# Patient Record
Sex: Female | Born: 1935 | Race: Black or African American | Hispanic: No | Smoking: Never smoker
Health system: Southern US, Community
[De-identification: ages and names within clinical notes are randomized; demographics above are authoritative.]

## PROBLEM LIST (undated history)

## (undated) DIAGNOSIS — K219 Gastro-esophageal reflux disease without esophagitis: Secondary | ICD-10-CM

## (undated) DIAGNOSIS — K5731 Diverticulosis of large intestine without perforation or abscess with bleeding: Secondary | ICD-10-CM

## (undated) DIAGNOSIS — I429 Cardiomyopathy, unspecified: Secondary | ICD-10-CM

## (undated) DIAGNOSIS — Z9114 Patient's other noncompliance with medication regimen: Secondary | ICD-10-CM

## (undated) DIAGNOSIS — I10 Essential (primary) hypertension: Secondary | ICD-10-CM

## (undated) DIAGNOSIS — K648 Other hemorrhoids: Secondary | ICD-10-CM

## (undated) DIAGNOSIS — E7800 Pure hypercholesterolemia, unspecified: Secondary | ICD-10-CM

## (undated) DIAGNOSIS — I4891 Unspecified atrial fibrillation: Secondary | ICD-10-CM

## (undated) DIAGNOSIS — I5042 Chronic combined systolic (congestive) and diastolic (congestive) heart failure: Secondary | ICD-10-CM

## (undated) DIAGNOSIS — N184 Chronic kidney disease, stage 4 (severe): Secondary | ICD-10-CM

## (undated) DIAGNOSIS — Z7901 Long term (current) use of anticoagulants: Secondary | ICD-10-CM

## (undated) DIAGNOSIS — Z91148 Patient's other noncompliance with medication regimen for other reason: Secondary | ICD-10-CM

## (undated) DIAGNOSIS — R600 Localized edema: Secondary | ICD-10-CM

## (undated) DIAGNOSIS — E669 Obesity, unspecified: Secondary | ICD-10-CM

## (undated) DIAGNOSIS — E1169 Type 2 diabetes mellitus with other specified complication: Secondary | ICD-10-CM

## (undated) DIAGNOSIS — J302 Other seasonal allergic rhinitis: Secondary | ICD-10-CM

## (undated) DIAGNOSIS — D6959 Other secondary thrombocytopenia: Secondary | ICD-10-CM

## (undated) DIAGNOSIS — K5790 Diverticulosis of intestine, part unspecified, without perforation or abscess without bleeding: Secondary | ICD-10-CM

## (undated) DIAGNOSIS — R739 Hyperglycemia, unspecified: Secondary | ICD-10-CM

## (undated) DIAGNOSIS — T50905A Adverse effect of unspecified drugs, medicaments and biological substances, initial encounter: Secondary | ICD-10-CM

## (undated) DIAGNOSIS — K922 Gastrointestinal hemorrhage, unspecified: Secondary | ICD-10-CM

## (undated) HISTORY — PX: BUNIONECTOMY: SHX129

## (undated) HISTORY — PX: APPENDECTOMY: SHX54

## (undated) HISTORY — PX: ADENOIDECTOMY: SUR15

## (undated) HISTORY — PX: CATARACT EXTRACTION: SUR2

## (undated) HISTORY — PX: JOINT REPLACEMENT: SHX530

## (undated) HISTORY — PX: TUBAL LIGATION: SHX77

## (undated) HISTORY — PX: TONSILLECTOMY: SUR1361

---

## 2005-08-15 ENCOUNTER — Inpatient Hospital Stay (HOSPITAL_COMMUNITY): Admission: RE | Admit: 2005-08-15 | Discharge: 2005-08-21 | Payer: Self-pay | Admitting: Orthopedic Surgery

## 2005-09-04 ENCOUNTER — Encounter: Payer: Self-pay | Admitting: Orthopedic Surgery

## 2005-09-05 ENCOUNTER — Encounter: Payer: Self-pay | Admitting: Orthopedic Surgery

## 2005-10-06 ENCOUNTER — Encounter: Payer: Self-pay | Admitting: Orthopedic Surgery

## 2005-11-05 ENCOUNTER — Encounter: Payer: Self-pay | Admitting: Orthopedic Surgery

## 2007-10-29 ENCOUNTER — Inpatient Hospital Stay (HOSPITAL_COMMUNITY): Admission: RE | Admit: 2007-10-29 | Discharge: 2007-11-02 | Payer: Self-pay | Admitting: Orthopedic Surgery

## 2007-12-02 ENCOUNTER — Encounter: Payer: Self-pay | Admitting: Orthopedic Surgery

## 2007-12-07 ENCOUNTER — Encounter: Payer: Self-pay | Admitting: Orthopedic Surgery

## 2008-01-06 ENCOUNTER — Encounter: Payer: Self-pay | Admitting: Orthopedic Surgery

## 2008-06-15 ENCOUNTER — Encounter: Payer: Self-pay | Admitting: Family Medicine

## 2008-07-06 ENCOUNTER — Encounter: Payer: Self-pay | Admitting: Family Medicine

## 2008-08-05 ENCOUNTER — Encounter: Payer: Self-pay | Admitting: Family Medicine

## 2008-09-05 ENCOUNTER — Encounter: Payer: Self-pay | Admitting: Family Medicine

## 2008-11-12 ENCOUNTER — Ambulatory Visit (HOSPITAL_COMMUNITY): Admission: RE | Admit: 2008-11-12 | Discharge: 2008-11-12 | Payer: Self-pay | Admitting: Nephrology

## 2008-11-26 ENCOUNTER — Ambulatory Visit (HOSPITAL_COMMUNITY): Admission: RE | Admit: 2008-11-26 | Discharge: 2008-11-26 | Payer: Self-pay | Admitting: Nephrology

## 2009-02-05 DIAGNOSIS — K5731 Diverticulosis of large intestine without perforation or abscess with bleeding: Secondary | ICD-10-CM

## 2009-02-05 DIAGNOSIS — K5790 Diverticulosis of intestine, part unspecified, without perforation or abscess without bleeding: Secondary | ICD-10-CM

## 2009-02-05 DIAGNOSIS — K648 Other hemorrhoids: Secondary | ICD-10-CM

## 2009-02-05 HISTORY — DX: Diverticulosis of large intestine without perforation or abscess with bleeding: K57.31

## 2009-02-05 HISTORY — DX: Diverticulosis of intestine, part unspecified, without perforation or abscess without bleeding: K57.90

## 2009-02-05 HISTORY — DX: Other hemorrhoids: K64.8

## 2009-07-13 ENCOUNTER — Emergency Department (HOSPITAL_COMMUNITY): Admission: EM | Admit: 2009-07-13 | Discharge: 2009-07-14 | Payer: Self-pay | Admitting: Emergency Medicine

## 2009-08-19 ENCOUNTER — Inpatient Hospital Stay (HOSPITAL_COMMUNITY): Admission: EM | Admit: 2009-08-19 | Discharge: 2009-08-21 | Payer: Self-pay | Admitting: Emergency Medicine

## 2009-08-19 ENCOUNTER — Ambulatory Visit: Payer: Self-pay | Admitting: Gastroenterology

## 2009-08-20 ENCOUNTER — Ambulatory Visit: Payer: Self-pay | Admitting: Gastroenterology

## 2009-08-20 ENCOUNTER — Encounter: Payer: Self-pay | Admitting: Internal Medicine

## 2009-08-20 HISTORY — PX: OTHER SURGICAL HISTORY: SHX169

## 2009-08-20 HISTORY — PX: ESOPHAGOGASTRODUODENOSCOPY: SHX1529

## 2009-09-22 ENCOUNTER — Encounter: Payer: Self-pay | Admitting: Gastroenterology

## 2010-02-27 ENCOUNTER — Encounter: Payer: Self-pay | Admitting: Family Medicine

## 2010-03-01 ENCOUNTER — Emergency Department (HOSPITAL_COMMUNITY)
Admission: EM | Admit: 2010-03-01 | Discharge: 2010-03-01 | Payer: Self-pay | Source: Home / Self Care | Admitting: Emergency Medicine

## 2010-03-01 LAB — LIPASE, BLOOD: Lipase: 23 U/L (ref 11–59)

## 2010-03-01 LAB — CBC
HCT: 36.4 % (ref 36.0–46.0)
Hemoglobin: 12.3 g/dL (ref 12.0–15.0)
MCH: 28.5 pg (ref 26.0–34.0)
MCHC: 33.8 g/dL (ref 30.0–36.0)
MCV: 84.3 fL (ref 78.0–100.0)
Platelets: 121 10*3/uL — ABNORMAL LOW (ref 150–400)
RBC: 4.32 MIL/uL (ref 3.87–5.11)
RDW: 14.8 % (ref 11.5–15.5)
WBC: 3.7 10*3/uL — ABNORMAL LOW (ref 4.0–10.5)

## 2010-03-01 LAB — COMPREHENSIVE METABOLIC PANEL
ALT: 31 U/L (ref 0–35)
AST: 27 U/L (ref 0–37)
Albumin: 4 g/dL (ref 3.5–5.2)
Alkaline Phosphatase: 59 U/L (ref 39–117)
BUN: 33 mg/dL — ABNORMAL HIGH (ref 6–23)
CO2: 21 mEq/L (ref 19–32)
Calcium: 8.9 mg/dL (ref 8.4–10.5)
Chloride: 110 mEq/L (ref 96–112)
Creatinine, Ser: 2.12 mg/dL — ABNORMAL HIGH (ref 0.4–1.2)
GFR calc Af Amer: 27 mL/min — ABNORMAL LOW (ref >60)
GFR calc non Af Amer: 23 mL/min — ABNORMAL LOW (ref >60)
Glucose, Bld: 148 mg/dL — ABNORMAL HIGH (ref 70–99)
Potassium: 4.4 mEq/L (ref 3.5–5.1)
Sodium: 140 mEq/L (ref 135–145)
Total Bilirubin: 1.2 mg/dL (ref 0.3–1.2)
Total Protein: 6.8 g/dL (ref 6.0–8.3)

## 2010-03-01 LAB — POCT CARDIAC MARKERS
CKMB, poc: 1.6 ng/mL (ref 1.0–8.0)
Myoglobin, poc: 91.9 ng/mL (ref 12–200)
Troponin i, poc: <0.05 ng/mL (ref 0.00–0.09)

## 2010-03-01 LAB — URINALYSIS, ROUTINE W REFLEX MICROSCOPIC
Bilirubin Urine: NEGATIVE
Hgb urine dipstick: NEGATIVE
Ketones, ur: NEGATIVE mg/dL
Nitrite: NEGATIVE
Protein, ur: NEGATIVE mg/dL
Specific Gravity, Urine: 1.02 (ref 1.005–1.030)
Urine Glucose, Fasting: NEGATIVE mg/dL
Urobilinogen, UA: 0.2 mg/dL (ref 0.0–1.0)
pH: 5 (ref 5.0–8.0)

## 2010-03-01 LAB — DIFFERENTIAL
Basophils Absolute: 0 10*3/uL (ref 0.0–0.1)
Basophils Relative: 1 % (ref 0–1)
Eosinophils Absolute: 0.1 10*3/uL (ref 0.0–0.7)
Eosinophils Relative: 4 % (ref 0–5)
Lymphocytes Relative: 35 % (ref 12–46)
Lymphs Abs: 1.3 10*3/uL (ref 0.7–4.0)
Monocytes Absolute: 0.3 10*3/uL (ref 0.1–1.0)
Monocytes Relative: 9 % (ref 3–12)
Neutro Abs: 1.9 10*3/uL (ref 1.7–7.7)
Neutrophils Relative %: 52 % (ref 43–77)

## 2010-03-01 LAB — PROTIME-INR
INR: 4.82 — ABNORMAL HIGH (ref 0.00–1.49)
Prothrombin Time: 44.9 seconds — ABNORMAL HIGH (ref 11.6–15.2)

## 2010-03-07 NOTE — Letter (Signed)
Summary: CONSULTATION  CONSULTATION   Imported By: Hoy Morn 09/22/2009 10:52:17  _____________________________________________________________________  External Attachment:    Type:   Image     Comment:   External Document

## 2010-04-22 LAB — COMPREHENSIVE METABOLIC PANEL
ALT: 45 U/L — ABNORMAL HIGH (ref 0–35)
AST: 30 U/L (ref 0–37)
Albumin: 3.3 g/dL — ABNORMAL LOW (ref 3.5–5.2)
Alkaline Phosphatase: 69 U/L (ref 39–117)
BUN: 22 mg/dL (ref 6–23)
CO2: 21 mEq/L (ref 19–32)
Calcium: 7.4 mg/dL — ABNORMAL LOW (ref 8.4–10.5)
Chloride: 104 mEq/L (ref 96–112)
Creatinine, Ser: 1.44 mg/dL — ABNORMAL HIGH (ref 0.4–1.2)
GFR calc Af Amer: 43 mL/min — ABNORMAL LOW (ref >60)
GFR calc non Af Amer: 35 mL/min — ABNORMAL LOW (ref >60)
Glucose, Bld: 86 mg/dL (ref 70–99)
Potassium: 3.1 mEq/L — ABNORMAL LOW (ref 3.5–5.1)
Sodium: 135 mEq/L (ref 135–145)
Total Bilirubin: 1.1 mg/dL (ref 0.3–1.2)
Total Protein: 6.1 g/dL (ref 6.0–8.3)

## 2010-04-22 LAB — ABO/RH: ABO/RH(D): O NEG

## 2010-04-22 LAB — BASIC METABOLIC PANEL
BUN: 19 mg/dL (ref 6–23)
BUN: 31 mg/dL — ABNORMAL HIGH (ref 6–23)
CO2: 18 mEq/L — ABNORMAL LOW (ref 19–32)
CO2: 21 mEq/L (ref 19–32)
Calcium: 6.8 mg/dL — ABNORMAL LOW (ref 8.4–10.5)
Calcium: 7.5 mg/dL — ABNORMAL LOW (ref 8.4–10.5)
Chloride: 102 mEq/L (ref 96–112)
Chloride: 109 mEq/L (ref 96–112)
Creatinine, Ser: 1.47 mg/dL — ABNORMAL HIGH (ref 0.4–1.2)
Creatinine, Ser: 1.77 mg/dL — ABNORMAL HIGH (ref 0.4–1.2)
GFR calc Af Amer: 34 mL/min — ABNORMAL LOW (ref >60)
GFR calc Af Amer: 42 mL/min — ABNORMAL LOW (ref >60)
GFR calc non Af Amer: 28 mL/min — ABNORMAL LOW (ref >60)
GFR calc non Af Amer: 34 mL/min — ABNORMAL LOW (ref >60)
Glucose, Bld: 118 mg/dL — ABNORMAL HIGH (ref 70–99)
Glucose, Bld: 131 mg/dL — ABNORMAL HIGH (ref 70–99)
Potassium: 3.5 mEq/L (ref 3.5–5.1)
Potassium: 4.1 mEq/L (ref 3.5–5.1)
Sodium: 129 mEq/L — ABNORMAL LOW (ref 135–145)
Sodium: 137 mEq/L (ref 135–145)

## 2010-04-22 LAB — DIFFERENTIAL
Basophils Absolute: 0 10*3/uL (ref 0.0–0.1)
Basophils Absolute: 0 10*3/uL (ref 0.0–0.1)
Basophils Absolute: 0.1 10*3/uL (ref 0.0–0.1)
Basophils Relative: 1 % (ref 0–1)
Basophils Relative: 1 % (ref 0–1)
Basophils Relative: 1 % (ref 0–1)
Eosinophils Absolute: 0.1 10*3/uL (ref 0.0–0.7)
Eosinophils Absolute: 0.1 10*3/uL (ref 0.0–0.7)
Eosinophils Absolute: 0.2 10*3/uL (ref 0.0–0.7)
Eosinophils Relative: 2 % (ref 0–5)
Eosinophils Relative: 2 % (ref 0–5)
Eosinophils Relative: 3 % (ref 0–5)
Lymphocytes Relative: 16 % (ref 12–46)
Lymphocytes Relative: 16 % (ref 12–46)
Lymphocytes Relative: 20 % (ref 12–46)
Lymphs Abs: 1 10*3/uL (ref 0.7–4.0)
Lymphs Abs: 1.1 10*3/uL (ref 0.7–4.0)
Lymphs Abs: 1.1 10*3/uL (ref 0.7–4.0)
Monocytes Absolute: 0.5 10*3/uL (ref 0.1–1.0)
Monocytes Absolute: 0.6 10*3/uL (ref 0.1–1.0)
Monocytes Absolute: 0.6 10*3/uL (ref 0.1–1.0)
Monocytes Relative: 10 % (ref 3–12)
Monocytes Relative: 9 % (ref 3–12)
Monocytes Relative: 9 % (ref 3–12)
Neutro Abs: 3.6 10*3/uL (ref 1.7–7.7)
Neutro Abs: 4.7 10*3/uL (ref 1.7–7.7)
Neutro Abs: 4.9 10*3/uL (ref 1.7–7.7)
Neutrophils Relative %: 68 % (ref 43–77)
Neutrophils Relative %: 72 % (ref 43–77)
Neutrophils Relative %: 72 % (ref 43–77)

## 2010-04-22 LAB — CBC
HCT: 25.7 % — ABNORMAL LOW (ref 36.0–46.0)
HCT: 26 % — ABNORMAL LOW (ref 36.0–46.0)
HCT: 32.1 % — ABNORMAL LOW (ref 36.0–46.0)
Hemoglobin: 10.9 g/dL — ABNORMAL LOW (ref 12.0–15.0)
Hemoglobin: 8.8 g/dL — ABNORMAL LOW (ref 12.0–15.0)
Hemoglobin: 8.8 g/dL — ABNORMAL LOW (ref 12.0–15.0)
MCH: 27.9 pg (ref 26.0–34.0)
MCH: 28.9 pg (ref 26.0–34.0)
MCH: 29.3 pg (ref 26.0–34.0)
MCHC: 33.8 g/dL (ref 30.0–36.0)
MCHC: 34 g/dL (ref 30.0–36.0)
MCHC: 34.1 g/dL (ref 30.0–36.0)
MCV: 82.4 fL (ref 78.0–100.0)
MCV: 84.9 fL (ref 78.0–100.0)
MCV: 86.3 fL (ref 78.0–100.0)
Platelets: 102 10*3/uL — ABNORMAL LOW (ref 150–400)
Platelets: 56 10*3/uL — ABNORMAL LOW (ref 150–400)
Platelets: 70 10*3/uL — ABNORMAL LOW (ref 150–400)
RBC: 3.03 MIL/uL — ABNORMAL LOW (ref 3.87–5.11)
RBC: 3.16 MIL/uL — ABNORMAL LOW (ref 3.87–5.11)
RBC: 3.72 MIL/uL — ABNORMAL LOW (ref 3.87–5.11)
RDW: 17.1 % — ABNORMAL HIGH (ref 11.5–15.5)
RDW: 17.3 % — ABNORMAL HIGH (ref 11.5–15.5)
RDW: 17.7 % — ABNORMAL HIGH (ref 11.5–15.5)
WBC: 5.4 10*3/uL (ref 4.0–10.5)
WBC: 6.6 10*3/uL (ref 4.0–10.5)
WBC: 6.7 10*3/uL (ref 4.0–10.5)

## 2010-04-22 LAB — PREPARE FRESH FROZEN PLASMA

## 2010-04-22 LAB — CROSSMATCH
ABO/RH(D): O NEG
Antibody Screen: NEGATIVE

## 2010-04-22 LAB — HEMOGLOBIN A1C
Hgb A1c MFr Bld: 6.2 % — ABNORMAL HIGH (ref <5.7)
Mean Plasma Glucose: 131 mg/dL — ABNORMAL HIGH (ref <117)

## 2010-04-22 LAB — PROTIME-INR
INR: 1.21 (ref 0.00–1.49)
INR: 1.3 (ref 0.00–1.49)
INR: 2.8 — ABNORMAL HIGH (ref 0.00–1.49)
Prothrombin Time: 15.2 seconds (ref 11.6–15.2)
Prothrombin Time: 16.1 seconds — ABNORMAL HIGH (ref 11.6–15.2)
Prothrombin Time: 29.3 seconds — ABNORMAL HIGH (ref 11.6–15.2)

## 2010-04-22 LAB — APTT
aPTT: 35 seconds (ref 24–37)
aPTT: 48 seconds — ABNORMAL HIGH (ref 24–37)

## 2010-04-22 LAB — GLUCOSE, CAPILLARY: Glucose-Capillary: 97 mg/dL (ref 70–99)

## 2010-04-22 LAB — TSH: TSH: 4.907 u[IU]/mL — ABNORMAL HIGH (ref 0.350–4.500)

## 2010-04-22 LAB — HEMOGLOBIN AND HEMATOCRIT, BLOOD
HCT: 28.7 % — ABNORMAL LOW (ref 36.0–46.0)
HCT: 32 % — ABNORMAL LOW (ref 36.0–46.0)
Hemoglobin: 10.9 g/dL — ABNORMAL LOW (ref 12.0–15.0)
Hemoglobin: 9.9 g/dL — ABNORMAL LOW (ref 12.0–15.0)

## 2010-04-22 LAB — LACTIC ACID, PLASMA: Lactic Acid, Venous: 1 mmol/L (ref 0.5–2.2)

## 2010-04-22 LAB — PREPARE RBC (CROSSMATCH)

## 2010-04-24 LAB — URINALYSIS, ROUTINE W REFLEX MICROSCOPIC
Bilirubin Urine: NEGATIVE
Glucose, UA: NEGATIVE mg/dL
Hgb urine dipstick: NEGATIVE
Ketones, ur: NEGATIVE mg/dL
Nitrite: NEGATIVE
Protein, ur: NEGATIVE mg/dL
Specific Gravity, Urine: 1.02 (ref 1.005–1.030)
Urobilinogen, UA: 0.2 mg/dL (ref 0.0–1.0)
pH: 5 (ref 5.0–8.0)

## 2010-06-20 NOTE — Op Note (Signed)
NAME:  [name] [name], [name] [name]               ACCOUNT NO.:  [id number]   MEDICAL RECORD NO.:  [id number]          PATIENT TYPE:  INP   LOCATION:  5039                         FACILITY:  [facility]   PHYSICIAN:  Ninetta Lights, M.D. DATE OF BIRTH:  06/03/1930   DATE OF PROCEDURE:  10/30/2007  DATE OF DISCHARGE:                               OPERATIVE REPORT   PREOPERATIVE DIAGNOSIS:  Left knee end-stage degenerative arthritis,  varus alignment and flexion contracture.   POSTOPERATIVE DIAGNOSIS:  Left knee end-stage degenerative arthritis,  varus alignment and flexion contracture.   PROCEDURE:  1. Left total knee replacement, Stryker triathlon prosthesis.  2. Soft tissue balancing.  3. Cemented pegged posterior stabilized #2 femoral component.  4. Cemented #3 tibial component with 9-mm polyethylene insert.  5. Resurfacing cemented pegged medial offset 32-mm patellar component.   SURGEON:  Ninetta Lights, MD   ASSISTANT:  Alyson Locket. Ricard Dillon, [place], present throughout the entire case,  necessary for timely completion of procedure.   ANESTHESIA:  General.   BLOOD LOSS:  Minimal.   SPECIMENS:  None.   CULTURES:  None.   COMPLICATIONS:  None.   DRESSING:  Soft compressive knee immobilizer.   DRAIN:  Hemovac x1.   TOURNIQUET TIME:  One hour and 20 minutes.   PROCEDURE:  The patient was brought to the operating room and placed on  operating table in supine position.  After adequate anesthesia had been  obtained, tourniquet applied.  Prepped and draped in the usual sterile  fashion.  Exsanguinated with an elevation Esmarch and tourniquet  inflated to 350 mmHg.  Motion 5 is little bit better than 90.  Varus  alignment just a little bit correctable.  Anterior incision above the  patella down to tibial tubercle, staying just above an old laceration of  the tibial tubercle, which was transverse.  Skin and subcutaneous tissue  divided.  Medial arthrotomy.  Knee exposed.  Remnants of menisci,  cruciate ligaments, and loose body spurs removed.  Grade 4 change  throughout.  Medial capsule release.  Distal femur exposed.  Intramedullary guide placed.  Distal cut 10 mm at 5 degrees valgus.  Using epicondylar axis; sized, cut, and fitted for #2 component which  fit well.  Front to back and side-to-side.  Attention turned to the  tibia.  Extramedullary guide.  Three-degree posterior slope cut.  Resection below the medial defect.  Size #3 component.  After soft  tissue balancing, cleaning out all debris throughout the knee including  spurs at the back.  I put the trials in place; #2 on the femur, #3 on  the tibia.  With a 9-mm insert, nice stability, full extension, and full  flexion.  Tibia was marked for appropriate rotation.  Patella exposed.  Posterior 10 mm removed.  Sized, drilled, and fitted for a 32-mm  component, which had good tracking withdrawals.  After trials removed,  the tibia was punched in appropriate rotation.  Copious irrigation with  a pulse irrigating device.  Cement prepared placed on all components.  All were firmly seated.  Polyethylene attached to the tibia.  Knee  reduced.  Once cement hardened, reexamined.  Full extension, full  flexion, good patellofemoral tracking, and good stability.  Wound  irrigated.  Hemovac placed and brought out through a separate stab  wound.  Arthrotomy closed with #1 Vicryl.  Skin and subcutaneous tissue  with Vicryl and staples.  Knee injected with Marcaine.  Hemovac clamped.  Sterile compressive dressing applied.  Knee immobilizer applied after  tourniquet removed.  Anesthesia reversed.  Brought to the recovery room.  Tolerated surgery well.  No complications.      Ninetta Lights, M.D.  Electronically Signed     DFM/MEDQ  D:  10/30/2007  T:  10/30/2007  Job:  [id number]

## 2010-06-23 NOTE — Discharge Summary (Signed)
NAME:  [name] [name], [name] [name]               ACCOUNT NO.:  [id number]   MEDICAL RECORD NO.:  [id number]          PATIENT TYPE:  INP   LOCATION:  5016                         FACILITY:  [facility]   PHYSICIAN:  Ninetta Lights, M.D. DATE OF BIRTH:  [birth year]   DATE OF ADMISSION:  08/15/2005  DATE OF DISCHARGE:  08/21/2005                                 DISCHARGE SUMMARY   FINAL DIAGNOSES:  1. Status post right total knee replacement for end-stage degenerative      joint disease.  2. Hypertension.  3. Type 2 diabetes.   HISTORY OF PRESENT ILLNESS:  This 77 year old black female with a history of  end-stage DJD of the right knee and chronic pain, presented to our office  for preoperative evaluation for total knee replacement.  She had progressive  worsening pain with failed response to conservative treatment. Significant  decrease in her daily activities due to the ongoing complaint.   HOSPITAL COURSE:  On August 15, 2005 the patient was taken to the [facility] and a right cemented total knee replacement procedure performed. Surgeon  was Ninetta Lights, M.D. and assistant was M. Ricard Dillon, P.A.C.  Anesthesia  general. No specimens. Estimated blood loss minimal. Tourniquet time 80  minutes and one Hemovac drain placed. There were no surgical or anesthesia  complications and the patient was transferred to recovery in stable  condition. The patient had a long history of known atrial fibrillation,  diabetes and hypertension and internal medicine consult was called to follow  while patient admitted. Pharmacy protocol Coumadin was started. On August 16, 2005 the patient was doing well. Vital signs were stable and patient was  afebrile. Dressing was clean, dry and intact. Calf nontender and  neurovascularly intact. On August 17, 2005 the patient was doing well with  good pain control. No specific complaints.  Hemoglobin 11.6, hematocrit  35.0. Sodium 134, potassium 4.1, glucose was 91. INR 1.7. Wound  looked good,  staples intact. No signs of infection.  Hemovac drain discontinued. Calf  nontender, neurovascularly intact distally. Discontinued PCA and Foley. On  August 18, 2005 patient was doing well with no specific complaints. CPM 0-60  degrees. Temperature 98.4, pulse 100, respirations 20, blood pressure  102/60. The wound looked good, staples were intact. No drainage or signs of  infection.  On August 20, 2005 the patient was doing well with good pain  control. Hemoglobin 10.8. INR 2.0. Wound looked good. No signs of infection.  On August 21, 2005 the patient was doing well with good pain control. Did  great with  PT. Therapy said that she completed good all ambulation without  difficulty. The patient and family were requesting discharge home. No  complaints of chest pain, shortness of breath. Vital signs stable, afebrile.  INR 2.4. Wound looks good, staples intact. No drainage or signs of  infection. Calf nontender, neurovascularly intact.   DISPOSITION:  Discharged home today.   CONDITION ON DISCHARGE:  Good and stable.   DISCHARGE MEDICATIONS:  1. Percocet 5/325 1-2 tablets p.o. q.4-6 hours p.r.n. pain.  2. Robaxin 500 mg p.o.  q.6 hours p.r.n. spasms.  3. Coumadin per pharmacy protocol. Home Health nurse to monitor dose and      maintain INR of 2-3.  4. Resume her previous home medications.   INSTRUCTIONS:  The patient to work with [facility] and OT to improve  knee range of motion, strength and ambulation. Dressing changes daily as  needed. Follow up in 2 weeks for recheck. Return sooner if needed.      Ninetta Lights, M.D.  Electronically Signed     DFM/MEDQ  D:  11/21/2005  T:  11/21/2005  Job:  [id number]

## 2010-06-23 NOTE — Op Note (Signed)
NAME:  [name] [name], [name] [name]               ACCOUNT NO.:  [id number]   MEDICAL RECORD NO.:  [id number]          PATIENT TYPE:  INP   LOCATION:  5016                         FACILITY:  [facility]   PHYSICIAN:  Ninetta Lights, M.D. DATE OF BIRTH:  [birth year]   DATE OF PROCEDURE:  08/15/2005  DATE OF DISCHARGE:                                 OPERATIVE REPORT   PREOPERATIVE DIAGNOSES:  Degenerative arthritis, right knee, varus  alignment.   POSTOPERATIVE DIAGNOSES:  1.  Degenerative arthritis, right knee, varus alignment.  2.  Exogenous obesity.   PROCEDURE:  Right total knee replacement with Stryker-[employer]on[employer] prosthesis.  Triathlon components.  Cemented pegged #2-posterior stabilized femoral  component.  Cemented #3 tibial component with 9-mm polyethylene insert,  posterior stabilizer.  Cemented resurfacing 29 mm x 9 mm medial offset  patella component.  Soft tissue balancing and medial capsule release.   SURGEON:  Ninetta Lights, M.D.   ASSISTANT:  Benjiman Core, PA   ANESTHESIA:  General.   TOURNIQUET TIME:  One hour 30 minutes.   SPECIMENS:  None.   CULTURES:  None.   COMPLICATIONS:  None.   DRESSING:  Soft compressive knee immobilizer.   DRAINS:  Hemovac x1.   BLOOD LOSS:  Minimal.   PROCEDURE:  The patient was brought to the operating room and placed on the  operating room table in the supine position.  After adequate anesthesia had  been obtained, the right knee was examined.  Alignment of more than 5  degrees of varus, barely notable in neutral.  Full extension, although  hyperextension in the other knee.  Flexion 90 degrees.  Tourniquet applied.  All exposure a little bit more difficult because of exogenous obesity.  Prepped and draped in the usual sterile fashion.  Exsanguinated with an  Esmarch and tourniquet inflated to 350 mmHg.  Longitudinal incision above  the patella down to the tibial tubercle.  Skin and subcutaneous tissue  divided.  Generous amount of  subcutaneous fat.  Medial arthrotomy up into  the quad tendon as a minimally invasive approach was just not feasible  because of adiposity.  Knee exposed.  Grade IV changes throughout.  Starting  to get bony erosion in the medial compartment.  Remnants of menisci,  periarticular spur, remnants of cruciate ligaments all excised.  Distal  femur exposed.  Intramedullary guide placed.  Distal cut 5 degrees of  valgus, resecting 10 mm.  Utilizing epicondylar access and appropriate  guides, femur was sized and prepared with jigs for a #2 component.  Found to  fit well.  Attention was turned to the tibia.  Proximal cut extramedullary  guide 3-degree posterior slope cut, resecting 2-3 mm off the deficient  medial side and more generous laterally to get perpendicular to the long  angle of the tibia.  Sized for #3 component.  Patella had a 9-mm posterior  resection for the 29 x 9-mm component after appropriate sizing.  It was  drilled for the trials.  All trials put in place.  With the #9-mm insert  full extension and full flexion, nicely balanced knee with normal  mechanical  access after the medial capsular release.  Tibia was marked for appropriate  rotation and rim.  All trials removed.  Copious irrigation throughout.  All  loose bodies and all spurs removed, all recess examined.  Irrigation with  pulsatile lavage as well as antibiotic solution.  Cement prepared and placed  on all components, which were firmly seated.  Excess cement removed and  polyethylene attached to the tibia.  Knee reduced.  At completion full  extension and full flexion, a nicely balanced knee, excellent patellofemoral  tracking.  Hemovac placed and brought out through a separate stab wound.  Arthrotomy closed with #1-Vicryl, skin and subcutaneous tissue with Vicryl  and staples.  Sterile compressive dressing applied.  Tourniquet deflated and  removed.  Knee immobilizer applied.  Anesthesia reversed.  Brought to  recovery  room.  Tolerated the surgery well with no complications.      Ninetta Lights, M.D.  Electronically Signed     DFM/MEDQ  D:  08/16/2005  T:  08/17/2005  Job:  [id number]

## 2010-06-23 NOTE — Discharge Summary (Signed)
NAME:  [name] [name], [name] [name]               ACCOUNT NO.:  [id number]   MEDICAL RECORD NO.:  [id number]          PATIENT TYPE:  INP   LOCATION:  5039                         FACILITY:  [facility]   PHYSICIAN:  Ninetta Lights, M.D. DATE OF BIRTH:  1930-03-12   DATE OF ADMISSION:  10/29/2007  DATE OF DISCHARGE:  11/02/2007                               DISCHARGE SUMMARY   FINAL DIAGNOSES:  1. Status post left total knee replacement for end-stage degenerative      joint disease.  2. Atrial fibrillation.  3. Hypertension.  4. Gastroesophageal reflux disease.  5. Gout.  6. Hyperlipidemia.   HISTORY OF PRESENT ILLNESS:  A 78 year old black female with history of  end-stage DJD of left knee and chronic pain, presented to our office for  preop evaluation for a total knee replacement.  She had progressive  worsening pain with failed response to conservative treatment.  Significant decrease in her daily activities due to the ongoing  complaint.   HOSPITAL COURSE:  On October 29, 2007, the patient was taken to the OR  and a left total knee replacement procedure performed.   SURGEON:  Ninetta Lights, MD   ASSISTANT:  Benjiman Core, PA-C   ANESTHESIA:  General with femoral nerve block.   No specimens.   EBL minimal.   TOURNIQUET TIME:  90 minutes.   One Hemovac drain placed.   There were no surgical or anesthesia complications, and the patient was  transferred to Recovery in stable condition.   On October 30, 2007, the patient had complain of itching likely due to  Morphine PCA.  No complaints of chest pain or shortness of breath.  Vital signs stable and febrile.  Hemoglobin 11.2, hematocrit 33.5, INR  1.3.  Dressing clean, dry, and intact.  Calf nontender and  neurovascularly intact.  Discontinued the Morphine PCA.  Pharmacy  protocol Coumadin started.  PT/OT consults.  On October 31, 2007, the  patient had good pain control.  No complaint of itching.  No complaints  of chest pain  or shortness of breath.  Temperature 98.4, pulse 100,  respirations 18, blood pressure 121/61.  Hemoglobin 11.6, hematocrit  34.8, INR 1.8.  Wound looks good and staples intact.  No drainage or  signs of infection.  Hemovac drain discontinued.  Calf nontender and  neurovascularly intact.  Discontinued Percocet and started Demerol 50 mg  1 tab p.o. q.6-8 h. p.r.n. pain.  Discontinued Foley and saline lock IV.  On November 01, 2007, the patient doing well.  Progression with  therapy.  Vital signs stable and afebrile.  INR 2.3.  Wound looks good  and staples intact.  No sings of infection.  On November 02, 2007, the  patient doing well and states that she is ready to go home.  Vital signs  stable and afebrile.  INR 2.5.  Wound looks good and staples intact.  No  drainage or signs of infection.   CONDITION:  Good and stable.   DISPOSITION:  Discharged home.   MEDICATIONS:  1. Demerol 50 mg 1 tablet p.o. q.6-8 h. p.r.n. pain.  2.  Robaxin 500 mg 1 tablet p.o. q.6 h. p.r.n. spasms.  3. Coumadin pharmacy protocol.   INSTRUCTION:  The patient will work with home PT/OT to improve  ambulation, knee range of motion, and strength.  Coumadin x4 weeks  postop for DVT prophylaxis.  Daily dressing changes with 4x4 gauze and  tape.  Follow up when she is at 2 weeks postop for recheck.  Return  sooner if needed.      Ninetta Lights, M.D.  Electronically Signed     DFM/MEDQ  D:  12/24/2007  T:  12/25/2007  Job:  [id number]

## 2010-06-23 NOTE — Consult Note (Signed)
NAME:  [name], [name]               ACCOUNT NO.:  [id number]   MEDICAL RECORD NO.:  [id number]          PATIENT TYPE:  INP   LOCATION:  [id number]                         FACILITY:  [facility]   PHYSICIAN:  Edythe Lynn, M.D.       DATE OF BIRTH:  [birth year]   DATE OF CONSULTATION:  08/15/2005  DATE OF DISCHARGE:                                   CONSULTATION   PRIMARY CARE PHYSICIAN:  Dr. Marval Regal at [facility].   REQUESTING PHYSICIAN FOR THE CONSULT:  Dr. Kathryne Hitch with orthopedics.   REASON FOR CONSULTATION:  Atrial fibrillation, diabetes mellitus and  hypertension.   HISTORY OF PRESENT ILLNESS:  Ms. [name] [name] is a 77 year old African-American  woman with multiple medical problems who was admitted on August 15, 2005 with  the diagnosis of end-stage osteoarthritis of the right knee.  The patient  underwent right total knee replacement on August 15, 2005, and we were  consulted to help with the management of the patient's atrial fibrillation,  diabetes mellitus, hypertension.   PAST MEDICAL HISTORY:  1.  Hypertension.  2.  Diabetes mellitus.  3.  Atrial fibrillation on chronic Coumadin.  4.  Moderate mitral regurgitation.  5.  Pulmonary hypertension.  6.  Diastolic heart failure.  7.  Recent cardiac catheterization in 2004 with essentially normal coronary      arteries.  8.  History of gout.  9.  Obesity.  10. Gastroesophageal reflux disease.  11. Hyperlipidemia.  12. Status post bilateral tubal ligation.  13. Status post appendectomy.   HOME MEDICATIONS.:  1.  Atenolol 100 mg twice a day.  2.  Oxybutynin 5 mg twice a day.  3.  Felodipine 10 mg daily.  4.  Singulair 10 mg daily.  5.  Hydrochlorothiazide 12.5 mg daily.  6.  Lovastatin 20 mg at bedtime.  7.  Colchicine 0.6 mg daily.  8.  Allegra 180 mg daily.  9.  Warfarin 4 mg daily which was discontinued a week prior to admission.  10. Diovan 320 mg day.  11. Protonix 40 mg daily.  12. Clonidine 0.1 mg twice  a day.   SOCIAL HISTORY:  The patient is a widow.  She lives alone in [place],  [place].  The patient never smoked cigarettes.  She never drank  alcohol.  She does not use any illicit drugs.   FAMILY HISTORY:  The patient has a son with hypertension, a son that died in  a motor vehicle accident, and she has two daughters that are alive and well.  The patient has a brother that is alive, a brother that died with  alcoholism, and of note, both patient's parents died with a myocardial  infarction.   REVIEW OF SYSTEMS:  Negative for chest pain.  Negative nausea. Negative for  vomiting.  Negative for abdominal pain.  Negative for dyspnea.  Negative for  cough.  Positive for a mild sore throat secondary to the orotracheal  intubation and positive for markedly reduced functional capacity prior to  admission secondary severe osteoarthritis.   PHYSICAL EXAMINATION:  VITAL SIGNS:  Upon  consultation 98.0, blood pressure  110/50, heart rate 70, saturation 98% on 2 liters oxygen.  GENERAL APPEARANCE:  Obese African-American woman in no acute distress,  alert, oriented to place, person and time  HEENT:  Head is normocephalic, atraumatic.  Eyes:  Pupils equal and round,  reactive to light and accommodation.  Extraocular movements intact.  Conjunctivae are pink.  Sclerae are anicteric.  Throat is clear.  Mouth  without ulcerations.  NECK:  Is supple.  There are no JVD.  No carotid bruits.  CHEST:  Clear to auscultation bilaterally without wheeze, rhonchi or  crackles.  CARDIAC EXAM:  The patient's heart is irregularly irregular with 3/6  systolic murmur and very distant heart sounds.  ABDOMEN:  Is soft, nontender, nondistended.  Bowel sounds are present.  EXTREMITIES:  Have no edema.  NEUROLOGICAL EXAM:  Is nonfocal.   LABORATORY VALUES:  At the time of admission, sodium 137, potassium 4.5,  chloride 103, bicarb 25, BUN 34, creatinine 1.4.  PTT 38, PT 14.1, INR 1.  Urinalysis within  normal limits.  Liver function tests are within normal  limits.  White blood cell count 7.5, hemoglobin 15, platelet count is 179.  EKG shows atrial fibrillation.  Portable chest x-ray shows cardiomegaly, no  infiltrates.   ASSESSMENT AND PLAN:  1.  Atrial fibrillation.  The patient currently appears to be very good rate      control on atenolol 100 mg twice a day and will continue the current      dose.  Also will resume the Coumadin starting tonight.  This patient      does not require intravenous heparin at this point in time.  2.  Hypertension.  In the situation of acute surgical stress with fluid      shifts and blood loss, I will hold this patient's angiotensin receptor      blocker and the hydrochlorothiazide and will continue for now the      atenolol, the clonidine and the felodipine.  3.  Diabetes mellitus.  For now, we will use a sliding scale insulin and      monitor the patient's ABGs closely.  She does seem to have fairly good      controlled diabetes at this point in time.  4.  Slight elevation of BUN and creatinine.  I do suspect this is chronic in      nature.  Would keep a close eye on the values and hold Diovan and      hydrochlorothiazide for now.  5.  History of pulmonary hypertension and diastolic heart failure.  Will      monitor the patient closely for possibility      of respiratory distress which she does not have any at this point in      time.  6.  Gastritis and prophylaxis will be done using Protonix.   Thank you for the consultation.  [facility] will follow with you.      Edythe Lynn, M.D.  Electronically Signed     SL/MEDQ  D:  08/15/2005  T:  08/15/2005  Job:  [id number]   cc:   Marval Regal, M.D.  [facility]

## 2010-09-16 ENCOUNTER — Emergency Department (HOSPITAL_COMMUNITY): Payer: Medicare Other

## 2010-09-16 ENCOUNTER — Inpatient Hospital Stay (HOSPITAL_COMMUNITY)
Admission: EM | Admit: 2010-09-16 | Discharge: 2010-10-01 | DRG: 336 | Disposition: A | Discharge status: Home Health | Payer: Medicare Other | Attending: Internal Medicine | Admitting: Internal Medicine

## 2010-09-16 ENCOUNTER — Other Ambulatory Visit: Payer: Self-pay

## 2010-09-16 DIAGNOSIS — R778 Other specified abnormalities of plasma proteins: Secondary | ICD-10-CM | POA: Diagnosis present

## 2010-09-16 DIAGNOSIS — T45515A Adverse effect of anticoagulants, initial encounter: Secondary | ICD-10-CM | POA: Diagnosis present

## 2010-09-16 DIAGNOSIS — R112 Nausea with vomiting, unspecified: Secondary | ICD-10-CM | POA: Diagnosis present

## 2010-09-16 DIAGNOSIS — I482 Chronic atrial fibrillation, unspecified: Secondary | ICD-10-CM | POA: Diagnosis present

## 2010-09-16 DIAGNOSIS — K56609 Unspecified intestinal obstruction, unspecified as to partial versus complete obstruction: Secondary | ICD-10-CM | POA: Diagnosis present

## 2010-09-16 DIAGNOSIS — I959 Hypotension, unspecified: Secondary | ICD-10-CM | POA: Diagnosis not present

## 2010-09-16 DIAGNOSIS — N184 Chronic kidney disease, stage 4 (severe): Secondary | ICD-10-CM | POA: Diagnosis present

## 2010-09-16 DIAGNOSIS — D72829 Elevated white blood cell count, unspecified: Secondary | ICD-10-CM

## 2010-09-16 DIAGNOSIS — E876 Hypokalemia: Secondary | ICD-10-CM | POA: Diagnosis present

## 2010-09-16 DIAGNOSIS — R946 Abnormal results of thyroid function studies: Secondary | ICD-10-CM | POA: Diagnosis present

## 2010-09-16 DIAGNOSIS — N189 Chronic kidney disease, unspecified: Secondary | ICD-10-CM

## 2010-09-16 DIAGNOSIS — R791 Abnormal coagulation profile: Secondary | ICD-10-CM | POA: Diagnosis present

## 2010-09-16 DIAGNOSIS — D649 Anemia, unspecified: Secondary | ICD-10-CM

## 2010-09-16 DIAGNOSIS — B373 Candidiasis of vulva and vagina: Secondary | ICD-10-CM | POA: Diagnosis present

## 2010-09-16 DIAGNOSIS — N179 Acute kidney failure, unspecified: Secondary | ICD-10-CM | POA: Diagnosis present

## 2010-09-16 DIAGNOSIS — N183 Chronic kidney disease, stage 3 unspecified: Secondary | ICD-10-CM | POA: Diagnosis present

## 2010-09-16 DIAGNOSIS — M109 Gout, unspecified: Secondary | ICD-10-CM | POA: Diagnosis present

## 2010-09-16 DIAGNOSIS — I129 Hypertensive chronic kidney disease with stage 1 through stage 4 chronic kidney disease, or unspecified chronic kidney disease: Secondary | ICD-10-CM | POA: Diagnosis present

## 2010-09-16 DIAGNOSIS — D696 Thrombocytopenia, unspecified: Secondary | ICD-10-CM

## 2010-09-16 DIAGNOSIS — I4891 Unspecified atrial fibrillation: Secondary | ICD-10-CM | POA: Diagnosis present

## 2010-09-16 DIAGNOSIS — Z7901 Long term (current) use of anticoagulants: Secondary | ICD-10-CM

## 2010-09-16 DIAGNOSIS — E870 Hyperosmolality and hypernatremia: Secondary | ICD-10-CM | POA: Diagnosis not present

## 2010-09-16 DIAGNOSIS — E119 Type 2 diabetes mellitus without complications: Secondary | ICD-10-CM | POA: Diagnosis present

## 2010-09-16 DIAGNOSIS — E0590 Thyrotoxicosis, unspecified without thyrotoxic crisis or storm: Secondary | ICD-10-CM | POA: Diagnosis not present

## 2010-09-16 DIAGNOSIS — B3731 Acute candidiasis of vulva and vagina: Secondary | ICD-10-CM | POA: Diagnosis present

## 2010-09-16 DIAGNOSIS — K565 Intestinal adhesions [bands], unspecified as to partial versus complete obstruction: Principal | ICD-10-CM | POA: Diagnosis present

## 2010-09-16 HISTORY — DX: Gastro-esophageal reflux disease without esophagitis: K21.9

## 2010-09-16 HISTORY — DX: Other secondary thrombocytopenia: T50.905A

## 2010-09-16 HISTORY — DX: Other seasonal allergic rhinitis: J30.2

## 2010-09-16 HISTORY — DX: Adverse effect of unspecified drugs, medicaments and biological substances, initial encounter: D69.59

## 2010-09-16 HISTORY — DX: Unspecified atrial fibrillation: I48.91

## 2010-09-16 HISTORY — DX: Essential (primary) hypertension: I10

## 2010-09-16 HISTORY — DX: Diverticulosis of large intestine without perforation or abscess with bleeding: K57.31

## 2010-09-16 HISTORY — DX: Hyperglycemia, unspecified: R73.9

## 2010-09-16 HISTORY — DX: Pure hypercholesterolemia, unspecified: E78.00

## 2010-09-16 LAB — CBC
HCT: 41.3 % (ref 36.0–46.0)
Hemoglobin: 14.3 g/dL (ref 12.0–15.0)
MCH: 28.1 pg (ref 26.0–34.0)
MCHC: 34.6 g/dL (ref 30.0–36.0)
MCV: 81.1 fL (ref 78.0–100.0)
Platelets: 181 10*3/uL (ref 150–400)
RBC: 5.09 MIL/uL (ref 3.87–5.11)
RDW: 15.5 % (ref 11.5–15.5)
WBC: 10.1 10*3/uL (ref 4.0–10.5)

## 2010-09-16 LAB — COMPREHENSIVE METABOLIC PANEL
ALT: 16 U/L (ref 0–35)
AST: 17 U/L (ref 0–37)
Albumin: 3.9 g/dL (ref 3.5–5.2)
Alkaline Phosphatase: 69 U/L (ref 39–117)
BUN: 76 mg/dL — ABNORMAL HIGH (ref 6–23)
CO2: 22 mEq/L (ref 19–32)
Calcium: 8.8 mg/dL (ref 8.4–10.5)
Chloride: 92 mEq/L — ABNORMAL LOW (ref 96–112)
Creatinine, Ser: 5.69 mg/dL — ABNORMAL HIGH (ref 0.50–1.10)
GFR calc Af Amer: 9 mL/min — ABNORMAL LOW (ref >60)
GFR calc non Af Amer: 7 mL/min — ABNORMAL LOW (ref >60)
Glucose, Bld: 124 mg/dL — ABNORMAL HIGH (ref 70–99)
Potassium: 3 mEq/L — ABNORMAL LOW (ref 3.5–5.1)
Sodium: 141 mEq/L (ref 135–145)
Total Bilirubin: 2 mg/dL — ABNORMAL HIGH (ref 0.3–1.2)
Total Protein: 7.9 g/dL (ref 6.0–8.3)

## 2010-09-16 LAB — POCT I-STAT, CHEM 8
BUN: 72 mg/dL — ABNORMAL HIGH (ref 6–23)
Calcium, Ion: 0.83 mmol/L — ABNORMAL LOW (ref 1.12–1.32)
Chloride: 102 mEq/L (ref 96–112)
Creatinine, Ser: 6.9 mg/dL — ABNORMAL HIGH (ref 0.50–1.10)
Glucose, Bld: 123 mg/dL — ABNORMAL HIGH (ref 70–99)
HCT: 44 % (ref 36.0–46.0)
Hemoglobin: 15 g/dL (ref 12.0–15.0)
Potassium: 3 mEq/L — ABNORMAL LOW (ref 3.5–5.1)
Sodium: 139 mEq/L (ref 135–145)
TCO2: 24 mmol/L (ref 0–100)

## 2010-09-16 LAB — CK TOTAL AND CKMB (NOT AT ARMC)
CK, MB: 3.1 ng/mL (ref 0.3–4.0)
Relative Index: INVALID (ref 0.0–2.5)
Total CK: 75 U/L (ref 7–177)

## 2010-09-16 LAB — DIFFERENTIAL
Basophils Absolute: 0 10*3/uL (ref 0.0–0.1)
Basophils Relative: 0 % (ref 0–1)
Eosinophils Absolute: 0 10*3/uL (ref 0.0–0.7)
Eosinophils Relative: 0 % (ref 0–5)
Lymphocytes Relative: 15 % (ref 12–46)
Lymphs Abs: 1.5 10*3/uL (ref 0.7–4.0)
Monocytes Absolute: 1 10*3/uL (ref 0.1–1.0)
Monocytes Relative: 10 % (ref 3–12)
Neutro Abs: 7.6 10*3/uL (ref 1.7–7.7)
Neutrophils Relative %: 76 % (ref 43–77)

## 2010-09-16 LAB — TROPONIN I: Troponin I: 0.48 ng/mL (ref <0.30)

## 2010-09-16 LAB — PROTIME-INR
INR: 3.13 — ABNORMAL HIGH (ref 0.00–1.49)
Prothrombin Time: 32.7 seconds — ABNORMAL HIGH (ref 11.6–15.2)

## 2010-09-16 LAB — MAGNESIUM: Magnesium: 1.3 mg/dL — ABNORMAL LOW (ref 1.5–2.5)

## 2010-09-16 LAB — PHOSPHORUS: Phosphorus: 6.7 mg/dL — ABNORMAL HIGH (ref 2.3–4.6)

## 2010-09-16 LAB — LACTIC ACID, PLASMA: Lactic Acid, Venous: 2.6 mmol/L — ABNORMAL HIGH (ref 0.5–2.2)

## 2010-09-16 LAB — APTT: aPTT: 48 seconds — ABNORMAL HIGH (ref 24–37)

## 2010-09-16 LAB — PROCALCITONIN: Procalcitonin: 0.54 ng/mL

## 2010-09-16 MED ORDER — FAMOTIDINE IN NACL 20-0.9 MG/50ML-% IV SOLN
20.0000 mg | Freq: Once | INTRAVENOUS | Status: AC
  Administered 2010-09-16: 20 mg via INTRAVENOUS
  Filled 2010-09-16: qty 50

## 2010-09-16 MED ORDER — PANTOPRAZOLE SODIUM 40 MG IV SOLR
40.0000 mg | Freq: Once | INTRAVENOUS | Status: AC
  Administered 2010-09-16: 40 mg via INTRAVENOUS
  Filled 2010-09-16: qty 40

## 2010-09-16 MED ORDER — POTASSIUM CHLORIDE IN NACL 40-0.9 MEQ/L-% IV SOLN
INTRAVENOUS | Status: DC
  Administered 2010-09-16 – 2010-09-17 (×2): via INTRAVENOUS
  Administered 2010-09-17: 150 mL/h via INTRAVENOUS
  Administered 2010-09-18: 06:00:00 via INTRAVENOUS
  Filled 2010-09-16 (×7): qty 1000

## 2010-09-16 MED ORDER — PANTOPRAZOLE SODIUM 40 MG IV SOLR
40.0000 mg | INTRAVENOUS | Status: DC
  Administered 2010-09-16 – 2010-09-27 (×12): 40 mg via INTRAVENOUS
  Filled 2010-09-16 (×12): qty 40

## 2010-09-16 MED ORDER — POTASSIUM CHLORIDE IN NACL 40-0.9 MEQ/L-% IV SOLN
INTRAVENOUS | Status: AC
  Filled 2010-09-16: qty 1000

## 2010-09-16 MED ORDER — SODIUM CHLORIDE 0.9 % IV BOLUS (SEPSIS)
1000.0000 mL | Freq: Once | INTRAVENOUS | Status: AC
  Administered 2010-09-16: 1000 mL via INTRAVENOUS

## 2010-09-16 MED ORDER — METOCLOPRAMIDE HCL 5 MG/ML IJ SOLN
10.0000 mg | Freq: Once | INTRAMUSCULAR | Status: AC
  Administered 2010-09-16: 10 mg via INTRAVENOUS
  Filled 2010-09-16: qty 2

## 2010-09-16 MED ORDER — ONDANSETRON HCL 4 MG/2ML IJ SOLN
4.0000 mg | Freq: Four times a day (QID) | INTRAMUSCULAR | Status: DC | PRN
  Administered 2010-09-20 – 2010-09-21 (×4): 4 mg via INTRAVENOUS
  Filled 2010-09-16 (×4): qty 2

## 2010-09-16 MED ORDER — SODIUM CHLORIDE 0.9 % IV SOLN
INTRAVENOUS | Status: DC
  Administered 2010-09-16: 17:00:00 via INTRAVENOUS

## 2010-09-16 MED ORDER — LEVALBUTEROL HCL 0.63 MG/3ML IN NEBU: 0.6300 mg | INHALATION_SOLUTION | RESPIRATORY_TRACT | Status: DC | PRN

## 2010-09-16 MED ORDER — SODIUM BICARBONATE 8.4 % IV SOLN
INTRAVENOUS | Status: DC
  Administered 2010-09-17: 02:00:00 via INTRAVENOUS
  Filled 2010-09-16 (×6): qty 1000

## 2010-09-16 MED ORDER — SODIUM CHLORIDE 0.9 % IV SOLN: Freq: Once | INTRAVENOUS | Status: DC

## 2010-09-16 MED ORDER — ACETAMINOPHEN 650 MG RE SUPP: 650.0000 mg | Freq: Four times a day (QID) | RECTAL | Status: DC | PRN

## 2010-09-16 MED ORDER — SODIUM CHLORIDE 0.9 % IJ SOLN
INTRAMUSCULAR | Status: AC
  Administered 2010-09-16: 20:00:00
  Filled 2010-09-16: qty 10

## 2010-09-16 MED ORDER — DILTIAZEM HCL 100 MG IV SOLR
5.0000 mg/h | INTRAVENOUS | Status: DC
  Administered 2010-09-16: 5 mg/h via INTRAVENOUS
  Filled 2010-09-16: qty 100

## 2010-09-16 MED ORDER — PROMETHAZINE HCL 25 MG/ML IJ SOLN
12.5000 mg | Freq: Four times a day (QID) | INTRAMUSCULAR | Status: DC | PRN
  Administered 2010-09-20 – 2010-09-21 (×4): 12.5 mg via INTRAVENOUS
  Filled 2010-09-16 (×4): qty 1

## 2010-09-16 MED ORDER — MORPHINE SULFATE 2 MG/ML IJ SOLN
2.0000 mg | INTRAMUSCULAR | Status: DC | PRN
  Administered 2010-09-17 – 2010-09-30 (×19): 2 mg via INTRAVENOUS
  Filled 2010-09-16 (×20): qty 1

## 2010-09-16 MED ORDER — ONDANSETRON HCL 4 MG/2ML IJ SOLN
4.0000 mg | Freq: Once | INTRAMUSCULAR | Status: AC
  Administered 2010-09-16: 4 mg via INTRAVENOUS
  Filled 2010-09-16: qty 2

## 2010-09-16 NOTE — ED Notes (Signed)
Foley inserted, no urine output returned at this time. EDP made aware.

## 2010-09-16 NOTE — ED Notes (Signed)
ems reports pt has had n/v x  3 days.  Reports family helped her in the shower this am and pt was too weak to stand.  Family lowered pt to the floor.  REports pt has not been taking her medications due to sickness.  EMS also reports pt sbp was 60's lying upon arrival.  EMS administered approx 500cc bolus nss.  Staff rechecking bp.  Pt alert and oriented x 4.  Denies any pain.  EMS says pt on coumadin but doesn't get blood checked as often as she should.

## 2010-09-16 NOTE — ED Notes (Signed)
Critical Lab received and given to EDP B. Miller. Trop 0.48

## 2010-09-16 NOTE — H&P (Signed)
[name] [name] MRN: [id number] DOB/AGE: 1930/02/27 77 y.o. Primary [place], MD Admit date: 09/16/2010 Chief Complaint: Nausea and vomiting.  HPI:  The patient is a 77 year old woman with a past medical history significant for chronic atrial fibrillation, chronic anti-coagulation, and chronic kidney disease, who presents to the emergency department today with a chief complaint of nausea and vomiting. Her symptoms started 4 days ago. She has had multiple bouts of nausea and vomiting over the past 4 days. She has seen no blood in her emesis. At times, her emesis had been brown but not coffee grounds consistency. She has had mild epigastric abdominal pain which started 2 days ago. She describes the pain as achy. Her last bowel movement was earlier today. It was small and yellow in color. She had a larger bowel movement 2 days ago. She has not urinated in almost 2 days. She did not seem to be bothered by the lack of urination. She has had no pain with urination previously. Her daughter, Kennyth Lose, had been trying to get the patient to come to the emergency department for the past 3 days, however she refused. It was when she became so weak, that she decided to come. She did slip and fell on the floor at home today. There was no head trauma, however, she complains of low back pain because she fell on her bottom.  In the emergency department, the patient is noted to be tachycardic and in atrial fibrillation. Her heart rate is ranging from 110-125. Her systolic blood pressure was initially 90. Her EKG reveals atrial fibrillation with rapid ventricular response, ST and T wave abnormalities in the inferior leads, and a heart rate of 116 beats per minute. Her lab data are significant for a creatinine of 6.90, BUN of 72, INR of 3.13, troponin high of 0.48. The CT scan of her abdomen and pelvis reveals a high grade small bowel obstruction with transition in the right lower abdomen.   Past Medical  History  Diagnosis Date  . Atrial fibrillation   . Hypertension   . Hypercholesterolemia   . Seasonal allergies   . Acid reflux   . CHF (congestive heart failure)     Hx of diastolic CHF.  Marland Kitchen Gout   . Diverticular hemorrhage     2011  . Thrombocytopenia due to drugs     Allopurinol  . CKD (chronic kidney disease) stage 3, GFR 30-59 ml/min   . Hyperglycemia     Query DM 2    Past Surgical History  Procedure Date  . Cesarean section   . Cataract extraction   . Joint replacement     Total right knee 2007  . Appendectomy   . Tubal ligation     Medications Prior to Admission:   Coreg 25 mg twice daily Colchicine 0.6 mg twice daily Diltiazem 120 mg daily Hydralazine 25 mg 4 times daily. Lovastatin 40 mg at bedtime. Remeron 30 mg at bedtime. Singulair 10 mg daily. Protonix 40 mg twice daily. Diovan/HCTZ 320/12.5 mg daily. Warfarin 3 mg on Mondays, Wednesdays, and Fridays. And 4 mg on Sundays, Tuesdays,               Thursdays, and Saturdays.  Furosemide  20 mg tablet, 1-1/2 tablets twice daily as needed for fluid retention.  Allergies:  Allergies  Allergen Reactions  . Codeine Nausea And Vomiting  . Sulfa Antibiotics Nausea And Vomiting    Family history: Both of her parents died of heart attacks.  Social  History:  She is widowed. She is retired. She lives in [place], [place]. Her granddaughter lives with her. She has family members who come to visit daily. She denies alcohol tobacco and drug use. She no longer drives.    ROS: Positive for occasional shortness of breath and arthritic pain. Also positive for occasional leg swelling, otherwise, review of systems is negative.  PHYSICAL EXAM: Blood pressure 110/67, pulse 118, temperature 97.8 F (36.6 C), temperature source Oral, resp. rate 20, height 5\' 5"  (1.651 m), weight 69.4 kg (153 lb), SpO2 96.00%.  General: The patient is currently sitting up in bed, in no acute distress. She is alert and  cooperative. HEENT: Head is normocephalic nontraumatic. Pupils equal round and reactive to light. Extraocular movements are intact. Conjunctivae are clear. Sclerae white. Oropharynx reveals mildly dry mucous membranes. No posterior exudates or erythema. NG tube inserted in the nares. Neck: Supple,  noJVD, no bruit, no adenopathy, no thyromegaly. Lungs: Are clear to auscultation bilaterally. Heart: Irregular irregular with tachycardia. Abdomen: Mildly obese, hypoactive bowel sounds, mildly tender in the epigastrium. Minimal distention. No guarding. NG tube draining a yellow fluid. GU and rectal deferred. Extremities: Pedal pulses barely palpable. No pedal edema. Neurologic: Alert and oriented x3. Cranial nerves II through XII are intact. Strength is 5 over 5 throughout. Sensation is grossly intact.  Basic Metabolic Panel:  Basename 09/16/10 1404 09/16/10 1330 09/16/10 1329  NA 139 -- 141  K 3.0* -- 3.0*  CL 102 -- 92*  CO2 -- -- 22  GLUCOSE 123* -- 124*  BUN 72* -- 76*  CREATININE 6.90* -- 5.69*  CALCIUM -- -- 8.8  MG -- 1.3* --  PHOS -- -- --   Liver Function Tests:  Basename 09/16/10 1329  AST 17  ALT 16  ALKPHOS 69  BILITOT 2.0*  PROT 7.9  ALBUMIN 3.9   No results found for this basename: LIPASE:2,AMYLASE:2 in the last 72 hours CBC:  Basename 09/16/10 1404 09/16/10 1329  WBC -- 10.1  NEUTROABS -- 7.6  HGB 15.0 14.3  HCT 44.0 41.3  MCV -- 81.1  PLT -- 181   Cardiac Enzymes:  Basename 09/16/10 1330  CKTOTAL 75  CKMB 3.1  CKMBINDEX --  TROPONINI 0.48*   BNP: No results found for this basename: POCBNP:3 in the last 72 hours D-Dimer: No results found for this basename: DDIMER:2 in the last 72 hours CBG: No results found for this basename: GLUCAP:6 in the last 72 hours Hemoglobin A1C: No results found for this basename: HGBA1C in the last 72 hours Fasting Lipid Panel: No results found for this basename: CHOL,HDL,LDLCALC,TRIG,CHOLHDL,LDLDIRECT in the last 72  hours Thyroid Function Tests: No results found for this basename: TSH,T4TOTAL,FREET4,T3FREE,THYROIDAB in the last 72 hours Anemia Panel: No results found for this basename: VITAMINB12,FOLATE,FERRITIN,TIBC,IRON,RETICCTPCT in the last 72 hours    EKG: Atrial fibrillation, heart rate 116 beats per minute, ST and T wave abnormalities in the inferior leads.  Recent Results (from the past 240 hour(s))  CULTURE, BLOOD (ROUTINE X 2)     Status: Normal (Preliminary result)   Collection Time   09/16/10  1:30 PM      Component Value Range Status Comment   Specimen Description BLOOD LEFT ANTECUBITAL   Final    Special Requests     Final    Value: BOTTLES DRAWN AEROBIC AND ANAEROBIC 6CC EACH BOTTLE   Culture PENDING   Incomplete    Report Status PENDING   Incomplete   CULTURE, BLOOD (ROUTINE X  2)     Status: Normal (Preliminary result)   Collection Time   09/16/10  1:47 PM      Component Value Range Status Comment   Specimen Description BLOOD LEFT HAND   Final    Special Requests     Final    Value: BOTTLES DRAWN AEROBIC AND ANAEROBIC 6CC EACH BOTTLE   Culture PENDING   Incomplete    Report Status PENDING   Incomplete      Ct Abdomen Pelvis Wo Contrast  09/16/2010  *RADIOLOGY REPORT*  Clinical Data: Abdominal pain, vomiting  CT ABDOMEN AND PELVIS WITHOUT CONTRAST  Technique:  Multidetector CT imaging of the abdomen and pelvis was performed following the standard protocol without intravenous contrast.  Comparison: 07/14/2009  Findings: Lung bases are essentially clear.  Liver is notable for a punctate calcified granuloma.  Spleen, pancreas, and adrenal glands within normal limits.  Gallbladder is notable for layering sludge/stones, without associated inflammatory changes.  No intrahepatic or extrahepatic ductal dilatation.  Kidneys are grossly unremarkable.  No hydronephrosis.  Multiple dilated loops of small bowel, with transition in the right lower abdomen (series 4/image 35), compatible with high  grade small bowel obstruction.  Decompressed colon with numerous colonic diverticula, without associated inflammatory changes.  Small volume perihepatic and pelvic ascites.  Uterus unremarkable.  No adnexal masses.  Bladder is decompressed by Foley catheter.  Degenerative changes of the visualized thoracolumbar spine.  IMPRESSION: High grade small bowel obstruction with transition in the right lower abdomen, as described above.  Original Report Authenticated By: Julian Hy, M.D.   Dg Chest 1 View  09/16/2010  *RADIOLOGY REPORT*  Clinical Data: 77 year old female with hypotension, tachycardia, vomiting.  CHEST - 1 VIEW  Comparison: Upright AP view at 1610 hours.  Findings: Chronic cardiomegaly.  Stable cardiac size and mediastinal contours.  Lordotic view.  Lung volumes appear stable. No pneumothorax, pulmonary edema, pleural effusion or confluent pulmonary opacity identified.  IMPRESSION: Stable cardiomegaly.  No acute cardiopulmonary abnormality.  Original Report Authenticated By: Randall An, M.D.    Impression: 1. Nausea and vomiting secondary to small bowel obstruction and possibly uremia.  2. Small bowel obstruction, high grade radiographically. An NG tube has been placed. On my exam, he is mildly tender and only mildly distended.  3. Acute renal failure , Superimposed on stage III chronic kidney disease. The patient's creatinine was 2 in January of this year and 1.4 last year. Foley catheter was inserted in the emergency department, and it is draining only 10 cc of urine. Her acute renal failure is likely prerenal.  4. Hypokalemia, likely secondary to vomiting.  5. Atrial fibrillation with rapid ventricular response. Tachycardia, in part, is secondary to her inability to take or tolerate her medications and dehydration.  6. Coagulopathy/chronic anti-coagulation secondary to atrial fibrillation.  7. Elevated troponin I. Likely secondary to acute renal failure.    Plan: An  NG-tube has been placed and is draining. The general surgeon on call, will be consulted. The patient will be NPO. Strict ins and outs. We'll start IV fluid hydration and add potassium chloride to the IV fluids. We will check a blood magnesium level to rule out deficiency. Supportive treatment with as needed morphine, as needed Zofran, or as needed Phenergan. IV Protonix will be started. Hold Coumadin for now. It will be restarted when her INR falls below 2 or if there is no indication for surgical repair.       Luv Mish 09/16/2010, 6:52 PM

## 2010-09-16 NOTE — ED Provider Notes (Signed)
History     CSN: [id number] Arrival date & time: 09/16/2010 12:55 PM  Chief Complaint  Patient presents with  . Weakness   HPI Comments: Patient is a well-appearing elderly female with a history of atrial fibrillation, hypertension, digestive heart failure presents to the emergency department with 3-4 days of gradually worsening nausea and vomiting. She denies having any specific pain or cough or shortness of breath but is unable to hold down fluids, fluids or her pills. She states that she has not had any of her medications in quite some time and she became more and more ill. She denies diarrhea, fever, cough, chest pain, abdominal pain, back pain, headaches. Symptoms are gradually worsening, constant, nothing makes better or worse. She was noted to be hypotensive by EMS and given a 500 cc bolus without much improvement. She was also noted to be in atrial fibrillation with a rapid ventricular response approximately 120 beats per minute. What prompted EMS call us at this morning she had fallen on her left side when she lost her balance in the kitchen and she denies hitting her head, neck or back pain. She does have some tenderness to her left side on the lower anterior ribs.  The history is provided by the patient, the EMS personnel, medical records and a relative.    Past Medical History  Diagnosis Date  . Atrial fibrillation   . Hypertension   . Hypercholesterolemia   . Seasonal allergies   . Acid reflux   . CHF (congestive heart failure)   . Gout     Past Surgical History  Procedure Date  . Cesarean section   . Cataract extraction   . Joint replacement     No family history on file.  History  Substance Use Topics  . Smoking status: Never Smoker   . Smokeless tobacco: Not on file  . Alcohol Use: No    OB History    Grav Para Term Preterm Abortions TAB SAB Ect Mult Living                  Review of Systems  Constitutional: Positive for fatigue. Negative for fever and  chills.  HENT: Negative for sore throat and neck pain.   Eyes: Negative for visual disturbance.  Respiratory: Negative for cough and shortness of breath.   Cardiovascular: Negative for chest pain.  Gastrointestinal: Positive for nausea and vomiting. Negative for abdominal pain and diarrhea.  Genitourinary: Negative for dysuria and frequency.  Musculoskeletal: Negative for back pain.  Skin: Negative for rash.  Neurological: Negative for weakness, numbness and headaches.  Hematological: Negative for adenopathy.  Psychiatric/Behavioral: Negative for behavioral problems.    Physical Exam  BP 90/60  Pulse 107  Temp(Src) 97.5 F (36.4 C) (Oral)  Ht 5\' 5"  (1.651 m)  Wt 153 lb (69.4 kg)  BMI 25.46 kg/m2  SpO2 100%  Physical Exam  Nursing note and vitals reviewed. Constitutional: She appears well-developed.  HENT:  Head: Normocephalic and atraumatic.  Mouth/Throat: No oropharyngeal exudate.       Mucous membranes dehydrated  Eyes: Conjunctivae and EOM are normal. Pupils are equal, round, and reactive to light. Right eye exhibits no discharge. Left eye exhibits no discharge. No scleral icterus.  Neck: Normal range of motion. Neck supple. No JVD present. No thyromegaly present.  Cardiovascular: Normal heart sounds and intact distal pulses.  Exam reveals no gallop and no friction rub.   No murmur heard.      Atrial fibrillation, tachycardia  Pulmonary/Chest: Effort normal and breath sounds normal. No respiratory distress. She has no wheezes. She has no rales. She exhibits tenderness (tender to palpation over the left anterior lateral chest wall).  Abdominal: Soft. Bowel sounds are normal. She exhibits no distension and no mass. There is tenderness (Lower abdominal tenderness to palpation in the suprapubic, right lower quadrant, left lower quadrant areas. No peritoneal).  Musculoskeletal: Normal range of motion. She exhibits no edema and no tenderness.       No tenderness to palpation,  deformity, bruising of the arms or legs bilaterally. Has full range of motion of the left leg and hip without difficulty or pain.  Lymphadenopathy:    She has no cervical adenopathy.  Neurological: She is alert. Coordination normal.  Skin: Skin is warm and dry. No rash noted. She is not diaphoretic. No erythema.  Psychiatric: She has a normal mood and affect. Her behavior is normal.    ED Course  CRITICAL CARE Performed by: Noemi Chapel D Authorized by: Noemi Chapel D Total critical care time: 35 minutes Critical care time was exclusive of separately billable procedures and treating other patients. Critical care was necessary to treat or prevent imminent or life-threatening deterioration of the following conditions: renal failure and dehydration (Hypotension, atrial fibrillation with a rapid ventricular response). Critical care was time spent personally by me on the following activities: pulse oximetry, ordering and performing treatments and interventions, evaluation of patient's response to treatment, development of treatment plan with patient or surrogate, examination of patient, ordering and review of laboratory studies, re-evaluation of patient's condition, review of old charts, ordering and review of radiographic studies, obtaining history from patient or surrogate and discussions with consultants.    MDM Patient is ill-appearing with tachycardia and hypotension and appears to be in mild distress due to nausea and vomiting. She has lower abdominal tenderness will require extensive workup with laboratories, imaging, medicines for nausea and IV fluids for hypotension   ED ECG REPORT   Date: 09/16/2010  1:56 PM  Rate: 116   Rhythm: atrial fibrillation and with RVR  QRS Axis: normal  Intervals: no sig abn  ST/T Wave abnormalities: nonspecific ST/T changes  Conduction Disutrbances:none  Narrative Interpretation:   Old EKG Reviewed: changes noted and afib now faster  Results for  orders placed during the hospital encounter of 09/16/10  CBC      Component Value Range   WBC 10.1  4.0 - 10.5 (K/uL)   RBC 5.09  3.87 - 5.11 (MIL/uL)   Hemoglobin 14.3  12.0 - 15.0 (g/dL)   HCT 41.3  36.0 - 46.0 (%)   MCV 81.1  78.0 - 100.0 (fL)   MCH 28.1  26.0 - 34.0 (pg)   MCHC 34.6  30.0 - 36.0 (g/dL)   RDW 15.5  11.5 - 15.5 (%)   Platelets 181  150 - 400 (K/uL)  DIFFERENTIAL      Component Value Range   Neutrophils Relative 76  43 - 77 (%)   Neutro Abs 7.6  1.7 - 7.7 (K/uL)   Lymphocytes Relative 15  12 - 46 (%)   Lymphs Abs 1.5  0.7 - 4.0 (K/uL)   Monocytes Relative 10  3 - 12 (%)   Monocytes Absolute 1.0  0.1 - 1.0 (K/uL)   Eosinophils Relative 0  0 - 5 (%)   Eosinophils Absolute 0.0  0.0 - 0.7 (K/uL)   Basophils Relative 0  0 - 1 (%)   Basophils Absolute 0.0  0.0 -  0.1 (K/uL)  COMPREHENSIVE METABOLIC PANEL      Component Value Range   Sodium 141  135 - 145 (mEq/L)   Potassium 3.0 (*) 3.5 - 5.1 (mEq/L)   Chloride 92 (*) 96 - 112 (mEq/L)   CO2 22  19 - 32 (mEq/L)   Glucose, Bld 124 (*) 70 - 99 (mg/dL)   BUN 76 (*) 6 - 23 (mg/dL)   Creatinine, Ser 5.69 (*) 0.50 - 1.10 (mg/dL)   Calcium 8.8  8.4 - 10.5 (mg/dL)   Total Protein 7.9  6.0 - 8.3 (g/dL)   Albumin 3.9  3.5 - 5.2 (g/dL)   AST 17  0 - 37 (U/L)   ALT 16  0 - 35 (U/L)   Alkaline Phosphatase 69  39 - 117 (U/L)   Total Bilirubin 2.0 (*) 0.3 - 1.2 (mg/dL)   GFR calc non Af Amer 7 (*) >60 (mL/min)   GFR calc Af Amer 9 (*) >60 (mL/min)  CK TOTAL AND CKMB      Component Value Range   Total CK 75  7 - 177 (U/L)   CK, MB 3.1  0.3 - 4.0 (ng/mL)   Relative Index RELATIVE INDEX IS INVALID  0.0 - 2.5   TROPONIN I      Component Value Range   Troponin I 0.48 (*) <0.30 (ng/mL)  CULTURE, BLOOD (ROUTINE X 2)      Component Value Range   Specimen Description BLOOD LEFT ANTECUBITAL     Special Requests       Value: BOTTLES DRAWN AEROBIC AND ANAEROBIC 6CC EACH BOTTLE   Culture PENDING     Report Status PENDING      CULTURE, BLOOD (ROUTINE X 2)      Component Value Range   Specimen Description BLOOD LEFT HAND     Special Requests       Value: BOTTLES DRAWN AEROBIC AND ANAEROBIC 6CC EACH BOTTLE   Culture PENDING     Report Status PENDING    LACTIC ACID, PLASMA      Component Value Range   Lactic Acid, Venous 2.6 (*) 0.5 - 2.2 (mmol/L)  PROCALCITONIN      Component Value Range   Procalcitonin 0.54    APTT      Component Value Range   aPTT 48 (*) 24 - 37 (seconds)  PROTIME-INR      Component Value Range   Prothrombin Time 32.7 (*) 11.6 - 15.2 (seconds)   INR 3.13 (*) 0.00 - 1.49   POCT I-STAT, CHEM 8      Component Value Range   Sodium 139  135 - 145 (mEq/L)   Potassium 3.0 (*) 3.5 - 5.1 (mEq/L)   Chloride 102  96 - 112 (mEq/L)   BUN 72 (*) 6 - 23 (mg/dL)   Creatinine, Ser 6.90 (*) 0.50 - 1.10 (mg/dL)   Glucose, Bld 123 (*) 70 - 99 (mg/dL)   Calcium, Ion 0.83 (*) 1.12 - 1.32 (mmol/L)   TCO2 24  0 - 100 (mmol/L)   Hemoglobin 15.0  12.0 - 15.0 (g/dL)   HCT 44.0  36.0 - 46.0 (%)     CT scan shows high grade bowel obstruction. NG tube placed, IV fluids continue for anuria and renal failure. Hypotension has resolved the tachycardia remains at the low 100 rate. I discussed the case with triad hospitalist Dr. Caryn Section who agrees to admit the patient and asks for a surgical consult. Surgery will page requested  Critical care provided  Dr.  Geroge Baseman agrees with plan, NGT and bowel rest.  Johnna Acosta, MD 09/16/10 [phone]

## 2010-09-16 NOTE — ED Notes (Signed)
Pt continues to vomit. Foley with no output. EDP B. Miller informed.

## 2010-09-16 NOTE — ED Notes (Addendum)
ICU-Shazia Mitchener Education officer, environmental given report by Domenica Reamer RN.

## 2010-09-16 NOTE — ED Notes (Signed)
Pt to CT. On cardiac monitor. EDP in to talk with pt and family prior to transport.

## 2010-09-16 NOTE — ED Notes (Signed)
CRITICAL VALUE ALERT  Critical value received: troponin 0.48  Date of notification:  09/16/2010  Time of notification:  [id number]  Critical value read back:yes  Nurse who received alert:  C. Corey Harold, RN  MD notified (1st page):  B. Sabra Heck, MD  Time of first page: [phone]  MD notified (2nd page):  Time of second page:  Responding MD:  Hazle Nordmann, MD  Time MD responded:  [phone]

## 2010-09-17 ENCOUNTER — Inpatient Hospital Stay (HOSPITAL_COMMUNITY): Payer: Medicare Other

## 2010-09-17 DIAGNOSIS — D696 Thrombocytopenia, unspecified: Secondary | ICD-10-CM | POA: Diagnosis not present

## 2010-09-17 DIAGNOSIS — I959 Hypotension, unspecified: Secondary | ICD-10-CM | POA: Diagnosis not present

## 2010-09-17 DIAGNOSIS — E119 Type 2 diabetes mellitus without complications: Secondary | ICD-10-CM | POA: Diagnosis present

## 2010-09-17 LAB — GLUCOSE, CAPILLARY
Glucose-Capillary: 120 mg/dL — ABNORMAL HIGH (ref 70–99)
Glucose-Capillary: 116 mg/dL — ABNORMAL HIGH (ref 70–99)
Glucose-Capillary: 110 mg/dL — ABNORMAL HIGH (ref 70–99)
Glucose-Capillary: 117 mg/dL — ABNORMAL HIGH (ref 70–99)

## 2010-09-17 LAB — BASIC METABOLIC PANEL
BUN: 76 mg/dL — ABNORMAL HIGH (ref 6–23)
CO2: 22 mEq/L (ref 19–32)
Calcium: 7.1 mg/dL — ABNORMAL LOW (ref 8.4–10.5)
Chloride: 100 mEq/L (ref 96–112)
Creatinine, Ser: 5.46 mg/dL — ABNORMAL HIGH (ref 0.50–1.10)
GFR calc Af Amer: 9 mL/min — ABNORMAL LOW (ref >60)
GFR calc non Af Amer: 8 mL/min — ABNORMAL LOW (ref >60)
Glucose, Bld: 104 mg/dL — ABNORMAL HIGH (ref 70–99)
Potassium: 3.5 mEq/L (ref 3.5–5.1)
Sodium: 143 mEq/L (ref 135–145)

## 2010-09-17 LAB — COMPREHENSIVE METABOLIC PANEL
ALT: 12 U/L (ref 0–35)
AST: 16 U/L (ref 0–37)
Albumin: 3.2 g/dL — ABNORMAL LOW (ref 3.5–5.2)
Alkaline Phosphatase: 55 U/L (ref 39–117)
BUN: 77 mg/dL — ABNORMAL HIGH (ref 6–23)
CO2: 25 mEq/L (ref 19–32)
Calcium: 7.1 mg/dL — ABNORMAL LOW (ref 8.4–10.5)
Chloride: 97 mEq/L (ref 96–112)
Creatinine, Ser: 5.02 mg/dL — ABNORMAL HIGH (ref 0.50–1.10)
GFR calc Af Amer: 10 mL/min — ABNORMAL LOW (ref >60)
GFR calc non Af Amer: 8 mL/min — ABNORMAL LOW (ref >60)
Glucose, Bld: 164 mg/dL — ABNORMAL HIGH (ref 70–99)
Potassium: 3.1 mEq/L — ABNORMAL LOW (ref 3.5–5.1)
Sodium: 141 mEq/L (ref 135–145)
Total Bilirubin: 1.4 mg/dL — ABNORMAL HIGH (ref 0.3–1.2)
Total Protein: 6.8 g/dL (ref 6.0–8.3)

## 2010-09-17 LAB — VITAMIN B12: Vitamin B-12: 718 pg/mL (ref 211–911)

## 2010-09-17 LAB — URINE MICROSCOPIC-ADD ON

## 2010-09-17 LAB — RENAL FUNCTION PANEL
Albumin: 3.3 g/dL — ABNORMAL LOW (ref 3.5–5.2)
BUN: 79 mg/dL — ABNORMAL HIGH (ref 6–23)
CO2: 23 mEq/L (ref 19–32)
Calcium: 7 mg/dL — ABNORMAL LOW (ref 8.4–10.5)
Chloride: 102 mEq/L (ref 96–112)
Creatinine, Ser: 5.53 mg/dL — ABNORMAL HIGH (ref 0.50–1.10)
GFR calc Af Amer: 9 mL/min — ABNORMAL LOW (ref >60)
GFR calc non Af Amer: 7 mL/min — ABNORMAL LOW (ref >60)
Glucose, Bld: 101 mg/dL — ABNORMAL HIGH (ref 70–99)
Phosphorus: 7.2 mg/dL — ABNORMAL HIGH (ref 2.3–4.6)
Potassium: 3.5 mEq/L (ref 3.5–5.1)
Sodium: 145 mEq/L (ref 135–145)

## 2010-09-17 LAB — CBC
HCT: 35.4 % — ABNORMAL LOW (ref 36.0–46.0)
Hemoglobin: 12 g/dL (ref 12.0–15.0)
MCH: 28.4 pg (ref 26.0–34.0)
MCHC: 33.9 g/dL (ref 30.0–36.0)
MCV: 83.7 fL (ref 78.0–100.0)
Platelets: 144 10*3/uL — ABNORMAL LOW (ref 150–400)
RBC: 4.23 MIL/uL (ref 3.87–5.11)
RDW: 16 % — ABNORMAL HIGH (ref 11.5–15.5)
WBC: 5.9 10*3/uL (ref 4.0–10.5)

## 2010-09-17 LAB — HEMOGLOBIN A1C
Hgb A1c MFr Bld: 6.3 % — ABNORMAL HIGH (ref <5.7)
Mean Plasma Glucose: 134 mg/dL — ABNORMAL HIGH (ref <117)

## 2010-09-17 LAB — TSH: TSH: 4.133 u[IU]/mL (ref 0.350–4.500)

## 2010-09-17 LAB — URINALYSIS, ROUTINE W REFLEX MICROSCOPIC
Glucose, UA: NEGATIVE mg/dL
Leukocytes, UA: NEGATIVE
Nitrite: NEGATIVE
Specific Gravity, Urine: >1.03 — ABNORMAL HIGH (ref 1.005–1.030)
Urobilinogen, UA: 0.2 mg/dL (ref 0.0–1.0)
pH: 5.5 (ref 5.0–8.0)

## 2010-09-17 LAB — T4, FREE: Free T4: 2.08 ng/dL — ABNORMAL HIGH (ref 0.80–1.80)

## 2010-09-17 LAB — PROTIME-INR
INR: 4.12 — ABNORMAL HIGH (ref 0.00–1.49)
Prothrombin Time: 40.5 seconds — ABNORMAL HIGH (ref 11.6–15.2)

## 2010-09-17 LAB — MRSA PCR SCREENING: MRSA by PCR: NEGATIVE

## 2010-09-17 LAB — MAGNESIUM: Magnesium: 1.3 mg/dL — ABNORMAL LOW (ref 1.5–2.5)

## 2010-09-17 MED ORDER — MAGNESIUM SULFATE 40 MG/ML IJ SOLN
INTRAMUSCULAR | Status: AC
  Filled 2010-09-17: qty 50

## 2010-09-17 MED ORDER — DEXTROSE 5 % IV SOLN
2.0000 g | Freq: Once | INTRAVENOUS | Status: DC
  Filled 2010-09-17: qty 4

## 2010-09-17 MED ORDER — MAGNESIUM SULFATE 40 MG/ML IJ SOLN
2.0000 g | Freq: Once | INTRAMUSCULAR | Status: AC
Start: 2010-09-17 — End: 2010-09-17
  Administered 2010-09-17: 2 g via INTRAVENOUS

## 2010-09-17 MED ORDER — METOPROLOL TARTRATE 1 MG/ML IV SOLN
5.0000 mg | INTRAVENOUS | Status: DC
  Administered 2010-09-17 – 2010-09-18 (×5): 5 mg via INTRAVENOUS
  Administered 2010-09-18: 2.5 mg via INTRAVENOUS
  Administered 2010-09-18: 5 mg via INTRAVENOUS
  Filled 2010-09-17 (×6): qty 5

## 2010-09-17 MED ORDER — PHENOL 1.4 % MT LIQD
1.0000 | OROMUCOSAL | Status: DC | PRN
  Filled 2010-09-17 (×2): qty 177

## 2010-09-17 MED ORDER — SODIUM BICARBONATE 8.4 % IV SOLN
INTRAVENOUS | Status: AC
  Filled 2010-09-17: qty 150

## 2010-09-17 MED ORDER — INSULIN ASPART 100 UNIT/ML ~~LOC~~ SOLN
0.0000 [IU] | SUBCUTANEOUS | Status: DC
  Administered 2010-09-17: 0 [IU] via SUBCUTANEOUS
  Administered 2010-09-18 (×2): 1 [IU] via SUBCUTANEOUS
  Administered 2010-09-18 (×2): 2 [IU] via SUBCUTANEOUS
  Administered 2010-09-18: 1 [IU] via SUBCUTANEOUS
  Administered 2010-09-18 – 2010-09-19 (×2): 2 [IU] via SUBCUTANEOUS
  Administered 2010-09-19 (×3): 1 [IU] via SUBCUTANEOUS
  Administered 2010-09-19: 2 [IU] via SUBCUTANEOUS
  Administered 2010-09-19 – 2010-09-24 (×10): 1 [IU] via SUBCUTANEOUS
  Administered 2010-09-25 – 2010-09-26 (×5): 2 [IU] via SUBCUTANEOUS
  Filled 2010-09-17: qty 3

## 2010-09-17 MED ORDER — KCL IN DEXTROSE-NACL 20-5-0.45 MEQ/L-%-% IV SOLN
INTRAVENOUS | Status: DC
  Administered 2010-09-17 – 2010-09-18 (×2): via INTRAVENOUS

## 2010-09-17 NOTE — Progress Notes (Deleted)
Befekadu called about nephrology consult

## 2010-09-17 NOTE — Progress Notes (Signed)
Subjective:  The patient says that she feels better this morning. She has had no nausea or vomiting overnight. She denies abdominal pain this morning. She complains of a sore throat from the NG tube. The nursing staff reports that her NG drainage in the emergency department yielded approximately 650 cc and overnight, in the ICU, it drained approximately 700 cc. She urinated only 200 cc overnight.  Objective: Vital signs in last 24 hours: Filed Vitals:   09/17/10 0500 09/17/10 0600 09/17/10 0700 09/17/10 0800  BP: 91/43 84/39 94/51  105/53  Pulse: 78 84 76 89  Temp:    97.6 F (36.4 C)  TempSrc:    Oral  Resp: 20 21 29 19   Height:      Weight:      SpO2: 90% 92% 91% 93%    Intake/Output Summary (Last 24 hours) at 09/17/10 0854 Last data filed at 09/17/10 0600  Gross per 24 hour  Intake 1851.42 ml  Output     50 ml  Net 1801.42 ml    Weight change:   General appearance: alert, cooperative and appears stated age Nose: Nares normal. Septum midline. Mucosa normal. No drainage or sinus tenderness., NG tube in place. Resp: clear to auscultation bilaterally Cardio: irregularly irregular rhythm GI: soft, non-tender; hypoactive bowel sounds normal; no masses,  no organomegaly Extremities: extremities normal, atraumatic, no cyanosis or edema  Lab Results: Basic Metabolic Panel:  Basename 09/17/10 0527 09/17/10 0017 09/16/10 1330  NA 141 143 --  K 3.1* 3.5 --  CL 97 100 --  CO2 25 22 --  GLUCOSE 164* 104* --  BUN 77* 76* --  CREATININE 5.02* 5.46* --  CALCIUM 7.1* 7.1* --  MG -- 1.3* 1.3*  PHOS -- -- 6.7*   Liver Function Tests:  Basename 09/17/10 0527 09/16/10 1329  AST 16 17  ALT 12 16  ALKPHOS 55 69  BILITOT 1.4* 2.0*  PROT 6.8 7.9  ALBUMIN 3.2* 3.9   No results found for this basename: LIPASE:2,AMYLASE:2 in the last 72 hours CBC:  Basename 09/17/10 0527 09/16/10 1404 09/16/10 1329  WBC 5.9 -- 10.1  NEUTROABS -- -- 7.6  HGB 12.0 15.0 --  HCT 35.4* 44.0 --    MCV 83.7 -- 81.1  PLT 144* -- 181   Cardiac Enzymes:  Basename 09/16/10 1330  CKTOTAL 75  CKMB 3.1  CKMBINDEX --  TROPONINI 0.48*   BNP: No results found for this basename: POCBNP:3 in the last 72 hours D-Dimer: No results found for this basename: DDIMER:2 in the last 72 hours CBG: No results found for this basename: GLUCAP:6 in the last 72 hours Hemoglobin A1C: No results found for this basename: HGBA1C in the last 72 hours Fasting Lipid Panel: No results found for this basename: CHOL,HDL,LDLCALC,TRIG,CHOLHDL,LDLDIRECT in the last 72 hours Thyroid Function Tests: No results found for this basename: TSH,T4TOTAL,FREET4,T3FREE,THYROIDAB in the last 72 hours Anemia Panel: No results found for this basename: VITAMINB12,FOLATE,FERRITIN,TIBC,IRON,RETICCTPCT in the last 72 hours  Urinalysis: Urine color yellow, specific gravity greater than 1.030, pH 5.5, bilirubin small, glucose negative, ketones trace, protein trace, 0-2 WBCs, 0-2 RBCs, many bacteria, rare squamous cells.  Misc. Labs:   Micro: Recent Results (from the past 240 hour(s))  CULTURE, BLOOD (ROUTINE X 2)     Status: Normal (Preliminary result)   Collection Time   09/16/10  1:30 PM      Component Value Range Status Comment   Specimen Description BLOOD LEFT ANTECUBITAL   Final    Special Requests  Final    Value: BOTTLES DRAWN AEROBIC AND ANAEROBIC 6CC EACH BOTTLE   Culture PENDING   Incomplete    Report Status PENDING   Incomplete   CULTURE, BLOOD (ROUTINE X 2)     Status: Normal (Preliminary result)   Collection Time   09/16/10  1:47 PM      Component Value Range Status Comment   Specimen Description BLOOD LEFT HAND   Final    Special Requests     Final    Value: BOTTLES DRAWN AEROBIC AND ANAEROBIC 6CC EACH BOTTLE   Culture PENDING   Incomplete    Report Status PENDING   Incomplete   MRSA PCR SCREENING     Status: Normal   Collection Time   09/16/10 10:45 PM      Component Value Range Status Comment    MRSA by PCR NEGATIVE  NEGATIVE  Final     Studies/Results: Ct Abdomen Pelvis Wo Contrast  09/16/2010  *RADIOLOGY REPORT*  Clinical Data: Abdominal pain, vomiting  CT ABDOMEN AND PELVIS WITHOUT CONTRAST  Technique:  Multidetector CT imaging of the abdomen and pelvis was performed following the standard protocol without intravenous contrast.  Comparison: 07/14/2009  Findings: Lung bases are essentially clear.  Liver is notable for a punctate calcified granuloma.  Spleen, pancreas, and adrenal glands within normal limits.  Gallbladder is notable for layering sludge/stones, without associated inflammatory changes.  No intrahepatic or extrahepatic ductal dilatation.  Kidneys are grossly unremarkable.  No hydronephrosis.  Multiple dilated loops of small bowel, with transition in the right lower abdomen (series 4/image 35), compatible with high grade small bowel obstruction.  Decompressed colon with numerous colonic diverticula, without associated inflammatory changes.  Small volume perihepatic and pelvic ascites.  Uterus unremarkable.  No adnexal masses.  Bladder is decompressed by Foley catheter.  Degenerative changes of the visualized thoracolumbar spine.  IMPRESSION: High grade small bowel obstruction with transition in the right lower abdomen, as described above.  Original Report Authenticated By: Julian Hy, M.D.   Dg Chest 1 View  09/16/2010  *RADIOLOGY REPORT*  Clinical Data: 77 year old female with hypotension, tachycardia, vomiting.  CHEST - 1 VIEW  Comparison: Upright AP view at 1610 hours.  Findings: Chronic cardiomegaly.  Stable cardiac size and mediastinal contours.  Lordotic view.  Lung volumes appear stable. No pneumothorax, pulmonary edema, pleural effusion or confluent pulmonary opacity identified.  IMPRESSION: Stable cardiomegaly.  No acute cardiopulmonary abnormality.  Original Report Authenticated By: Randall An, M.D.    Medications: I have reviewed the patient's current  medications.  Assessment: 1.Nausea and vomiting, secondary to small bowel obstruction and possibly uremia. Her nausea and vomiting has resolved with NG tube drainage.  2. Small bowel obstruction, high grade per CT scan. Her abdomen is soft currently. Her NG drained over 1300 cc this yesterday. Surgery consultation is pending.   3. Acute renal failure. History of stage III chronic kidney disease. She is now starting to make some urine. Is likely she has severe prerenal azotemia from dehydration.  4. Hypotension. This is likely secondary to the Cardizem drip and hypovolemia.  5. Chronic atrial fibrillation. Her heart rate is now controlled. Coumadin has been held, however, her INR is still supratherapeutic at 4.12.  6. Hypokalemia. This is secondary to NG tube drainage and previous nausea and vomiting.  7. Thrombocytopenia. Platelet count was within normal limits on admission but has fallen to 144. 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  8. Diet-controlled type 2 diabetes mellitus.  Her capillary blood glucose is increasing.  9. Elevated troponin I. This likely secondary to acute renal failure.  10. Hypomagnesemia. The patient was repleted overnight with intravenous magnesium sulfate.    Plan: We will add another IV line to infuse dextrose with potassium chloride. We'll start sliding scale NovoLog every 4 hours. We will consult nephrology if her renal function does not improve on current management. We will await general surgery consultation. We will discontinue Cardizem drip and add scheduled IV metoprolol. We will add an analgesic throat spray for comfort associated with the NG tube. We will order a renal ultrasound.  We will continue to monitor her urine output and associated laboratory studies. We will check a vitamin B 12 to assess thrombocytopenia. The results of her TSH and free T4 are pending.   LOS: 1 day   Andrian Sabala 09/17/2010, 8:54  AM

## 2010-09-17 NOTE — Progress Notes (Signed)
Surgery consult has been called. Dr. Geroge Baseman notified and aware of consult.

## 2010-09-18 ENCOUNTER — Inpatient Hospital Stay (HOSPITAL_COMMUNITY): Payer: Medicare Other

## 2010-09-18 DIAGNOSIS — E870 Hyperosmolality and hypernatremia: Secondary | ICD-10-CM | POA: Diagnosis not present

## 2010-09-18 DIAGNOSIS — E0590 Thyrotoxicosis, unspecified without thyrotoxic crisis or storm: Secondary | ICD-10-CM | POA: Diagnosis not present

## 2010-09-18 DIAGNOSIS — M109 Gout, unspecified: Secondary | ICD-10-CM | POA: Diagnosis present

## 2010-09-18 DIAGNOSIS — D649 Anemia, unspecified: Secondary | ICD-10-CM | POA: Diagnosis not present

## 2010-09-18 LAB — GLUCOSE, CAPILLARY
Glucose-Capillary: 128 mg/dL — ABNORMAL HIGH (ref 70–99)
Glucose-Capillary: 144 mg/dL — ABNORMAL HIGH (ref 70–99)
Glucose-Capillary: 150 mg/dL — ABNORMAL HIGH (ref 70–99)
Glucose-Capillary: 175 mg/dL — ABNORMAL HIGH (ref 70–99)
Glucose-Capillary: 183 mg/dL — ABNORMAL HIGH (ref 70–99)
Glucose-Capillary: 195 mg/dL — ABNORMAL HIGH (ref 70–99)

## 2010-09-18 LAB — URINE CULTURE
Colony Count: NO GROWTH
Culture  Setup Time: 201208121943
Culture: NO GROWTH

## 2010-09-18 LAB — RENAL FUNCTION PANEL
Albumin: 3.2 g/dL — ABNORMAL LOW (ref 3.5–5.2)
BUN: 54 mg/dL — ABNORMAL HIGH (ref 6–23)
CO2: 24 mEq/L (ref 19–32)
Calcium: 7.1 mg/dL — ABNORMAL LOW (ref 8.4–10.5)
Chloride: 111 mEq/L (ref 96–112)
Creatinine, Ser: 2.89 mg/dL — ABNORMAL HIGH (ref 0.50–1.10)
GFR calc Af Amer: 19 mL/min — ABNORMAL LOW (ref >60)
GFR calc non Af Amer: 16 mL/min — ABNORMAL LOW (ref >60)
Glucose, Bld: 151 mg/dL — ABNORMAL HIGH (ref 70–99)
Phosphorus: 2.7 mg/dL (ref 2.3–4.6)
Potassium: 4.8 mEq/L (ref 3.5–5.1)
Sodium: 148 mEq/L — ABNORMAL HIGH (ref 135–145)

## 2010-09-18 LAB — MAGNESIUM: Magnesium: 1.7 mg/dL (ref 1.5–2.5)

## 2010-09-18 LAB — CBC
HCT: 35.7 % — ABNORMAL LOW (ref 36.0–46.0)
Hemoglobin: 11.7 g/dL — ABNORMAL LOW (ref 12.0–15.0)
MCH: 28.5 pg (ref 26.0–34.0)
MCHC: 32.8 g/dL (ref 30.0–36.0)
MCV: 86.9 fL (ref 78.0–100.0)
Platelets: 137 10*3/uL — ABNORMAL LOW (ref 150–400)
RBC: 4.11 MIL/uL (ref 3.87–5.11)
RDW: 16.3 % — ABNORMAL HIGH (ref 11.5–15.5)
WBC: 6.9 10*3/uL (ref 4.0–10.5)

## 2010-09-18 LAB — PROTIME-INR
INR: 4.85 — ABNORMAL HIGH (ref 0.00–1.49)
Prothrombin Time: 46 seconds — ABNORMAL HIGH (ref 11.6–15.2)

## 2010-09-18 MED ORDER — METHYLPREDNISOLONE SODIUM SUCC 125 MG IJ SOLR
60.0000 mg | Freq: Four times a day (QID) | INTRAMUSCULAR | Status: AC
  Administered 2010-09-18: 60 mg via INTRAVENOUS
  Filled 2010-09-18: qty 2

## 2010-09-18 MED ORDER — POTASSIUM CHLORIDE IN NACL 40-0.9 MEQ/L-% IV SOLN
INTRAVENOUS | Status: AC
  Filled 2010-09-18: qty 1000

## 2010-09-18 MED ORDER — KCL IN DEXTROSE-NACL 20-5-0.45 MEQ/L-%-% IV SOLN
INTRAVENOUS | Status: DC
  Administered 2010-09-18: 20:00:00 via INTRAVENOUS
  Administered 2010-09-19: 100 mL via INTRAVENOUS
  Administered 2010-09-19 – 2010-09-20 (×2): via INTRAVENOUS

## 2010-09-18 MED ORDER — METOPROLOL TARTRATE 1 MG/ML IV SOLN
2.5000 mg | Freq: Four times a day (QID) | INTRAVENOUS | Status: DC | PRN
  Administered 2010-09-18: 2.5 mg via INTRAVENOUS
  Filled 2010-09-18: qty 5

## 2010-09-18 MED ORDER — DILTIAZEM HCL 100 MG IV SOLR
5.0000 mg/h | INTRAVENOUS | Status: DC
  Administered 2010-09-18: 10 mg/h via INTRAVENOUS
  Administered 2010-09-19 – 2010-09-21 (×4): 5 mg/h via INTRAVENOUS
  Administered 2010-09-23: 10 mg/h via INTRAVENOUS
  Administered 2010-09-24 (×2): 15 mg/h via INTRAVENOUS
  Filled 2010-09-18 (×10): qty 100

## 2010-09-18 MED ORDER — SODIUM CHLORIDE 0.9 % IV BOLUS (SEPSIS)
500.0000 mL | Freq: Once | INTRAVENOUS | Status: AC
  Administered 2010-09-18: 500 mL via INTRAVENOUS

## 2010-09-18 NOTE — Progress Notes (Signed)
Subjective: The patient pulled out her NG tube inadvertently. She has no complaints of abdominal pain nausea or vomiting. She does, however, complain of right foot pain which she says is secondary to gout.  Objective: Vital signs in last 24 hours: Filed Vitals:   09/18/10 0600 09/18/10 0700 09/18/10 0800 09/18/10 0900  BP:  132/84 138/74   Pulse: 117 119 100   Temp:   97.6 F (36.4 C)   TempSrc:   Oral   Resp: 22 26 20 21   Height:      Weight: 74.6 kg (164 lb 7.4 oz)     SpO2: 95% 92% 97%     Intake/Output Summary (Last 24 hours) at 09/18/10 0944 Last data filed at 09/18/10 0900  Gross per 24 hour  Intake 4983.33 ml  Output   3300 ml  Net 1683.33 ml    Weight change: 5.2 kg (11 lb 7.4 oz)  General appearance: alert, cooperative and appears stated age. Resp: clear to auscultation bilaterally, decreased breath sounds in the bases. Cardio: irregularly irregular rhythm, with tachycardia. GI: soft, mild epigastric tenderness; hypoactive bowel sounds normal; no masses,  no organomegaly Extremities: extremities normal, atraumatic, no cyanosis or edema. Right foot with mild erythema and edema globally and moderate tenderness over the first MTP joint.  Lab Results: Basic Metabolic Panel:  Basename 09/18/10 0549 09/17/10 0527 09/17/10 0017  NA 148* 141145 --  K 4.8 3.1*3.5 --  CL 111 97102 --  CO2 24 2523 --  GLUCOSE 151* 164*101* --  BUN 54* 77*79* --  CREATININE 2.89* 5.02*5.53* --  CALCIUM 7.1* 7.1*7.0* --  MG 1.7 -- 1.3*  PHOS 2.7 7.2* --   Liver Function Tests:  Basename 09/18/10 0549 09/17/10 0527 09/16/10 1329  AST -- 16 17  ALT -- 12 16  ALKPHOS -- 55 69  BILITOT -- 1.4* 2.0*  PROT -- 6.8 7.9  ALBUMIN 3.2* 3.2*3.3* --   No results found for this basename: LIPASE:2,AMYLASE:2 in the last 72 hours CBC:  Basename 09/18/10 0549 09/17/10 0527 09/16/10 1329  WBC 6.9 5.9 --  NEUTROABS -- -- 7.6  HGB 11.7* 12.0 --  HCT 35.7* 35.4* --  MCV 86.9 83.7 --    PLT 137* 144* --   Cardiac Enzymes:  Basename 09/16/10 1330  CKTOTAL 75  CKMB 3.1  CKMBINDEX --  TROPONINI 0.48*   BNP: No results found for this basename: POCBNP:3 in the last 72 hours D-Dimer: No results found for this basename: DDIMER:2 in the last 72 hours CBG:  Basename 09/18/10 0713 09/18/10 0354 09/18/10 0003 09/17/10 2002 09/17/10 1627 09/17/10 1314  GLUCAP 144* 150* 128* 117* 110* 116*   Hemoglobin A1C:  Basename 09/17/10 0527  HGBA1C 6.3*   Fasting Lipid Panel: No results found for this basename: CHOL,HDL,LDLCALC,TRIG,CHOLHDL,LDLDIRECT in the last 72 hours Thyroid Function Tests:  Basename 09/16/10 1330  TSH 4.133  T4TOTAL --  FREET4 2.08*  T3FREE --  THYROIDAB --   Anemia Panel:  Basename 09/17/10 0527  VITAMINB12 718  FOLATE --  FERRITIN --  TIBC --  IRON --  RETICCTPCT --    Urinalysis: Urine color yellow, specific gravity greater than 1.030, pH 5.5, bilirubin small, glucose negative, ketones trace, protein trace, 0-2 WBCs, 0-2 RBCs, many bacteria, rare squamous cells.  Misc. Labs:   Micro: Recent Results (from the past 240 hour(s))  CULTURE, BLOOD (ROUTINE X 2)     Status: Normal (Preliminary result)   Collection Time   09/16/10  1:30 PM  Component Value Range Status Comment   Specimen Description BLOOD LEFT ANTECUBITAL   Final    Special Requests     Final    Value: BOTTLES DRAWN AEROBIC AND ANAEROBIC 6CC EACH BOTTLE   Culture NO GROWTH 1 DAY   Final    Report Status PENDING   Incomplete   CULTURE, BLOOD (ROUTINE X 2)     Status: Normal (Preliminary result)   Collection Time   09/16/10  1:47 PM      Component Value Range Status Comment   Specimen Description BLOOD LEFT HAND   Final    Special Requests     Final    Value: BOTTLES DRAWN AEROBIC AND ANAEROBIC 6CC EACH BOTTLE   Culture NO GROWTH 1 DAY   Final    Report Status PENDING   Incomplete   MRSA PCR SCREENING     Status: Normal   Collection Time   09/16/10 10:45 PM       Component Value Range Status Comment   MRSA by PCR NEGATIVE  NEGATIVE  Final     Studies/Results: Ct Abdomen Pelvis Wo Contrast  09/16/2010  *RADIOLOGY REPORT*  Clinical Data: Abdominal pain, vomiting  CT ABDOMEN AND PELVIS WITHOUT CONTRAST  Technique:  Multidetector CT imaging of the abdomen and pelvis was performed following the standard protocol without intravenous contrast.  Comparison: 07/14/2009  Findings: Lung bases are essentially clear.  Liver is notable for a punctate calcified granuloma.  Spleen, pancreas, and adrenal glands within normal limits.  Gallbladder is notable for layering sludge/stones, without associated inflammatory changes.  No intrahepatic or extrahepatic ductal dilatation.  Kidneys are grossly unremarkable.  No hydronephrosis.  Multiple dilated loops of small bowel, with transition in the right lower abdomen (series 4/image 35), compatible with high grade small bowel obstruction.  Decompressed colon with numerous colonic diverticula, without associated inflammatory changes.  Small volume perihepatic and pelvic ascites.  Uterus unremarkable.  No adnexal masses.  Bladder is decompressed by Foley catheter.  Degenerative changes of the visualized thoracolumbar spine.  IMPRESSION: High grade small bowel obstruction with transition in the right lower abdomen, as described above.  Original Report Authenticated By: Julian Hy, M.D.   Dg Chest 1 View  09/16/2010  *RADIOLOGY REPORT*  Clinical Data: 77 year old female with hypotension, tachycardia, vomiting.  CHEST - 1 VIEW  Comparison: Upright AP view at 1610 hours.  Findings: Chronic cardiomegaly.  Stable cardiac size and mediastinal contours.  Lordotic view.  Lung volumes appear stable. No pneumothorax, pulmonary edema, pleural effusion or confluent pulmonary opacity identified.  IMPRESSION: Stable cardiomegaly.  No acute cardiopulmonary abnormality.  Original Report Authenticated By: Randall An, M.D.    Medications: I have  reviewed the patient's current medications.  Assessment: Nausea and vomiting, secondary to small bowel obstruction and possibly uremia. The NG tube was inadvertently pulled out. We will eval for now. She is currently asymptomatic.  Small bowel obstruction, high grade per CT scan. Clinically she is improving. Her abdomen is nondistended and minimally tender.   Acute renal failure. History of stage III chronic kidney disease. She is now making urine. Her renal function has improved progressively.  Hypotension. This has been transient secondary to rate limiting medications. We'll consider digoxin, however, I do not want to exacerbate or trigger recurrent nausea and vomiting.  Chronic atrial fibrillation. Her rate had increased overnight and still remains in the 100s. The Cardizem drip was discontinued yesterday in favor of IV metoprolol. Coumadin has been held, however, her INR is  still supratherapeutic.  Electrolyte abnormalities including hypernatremia, hypercalcemia, hypokalemia, and hypomagnesemia. She has received intravenous magnesium and potassium chloride in her IV fluids.  Thrombocytopenia. The decrease in her platelet count may be in part dilutional. Her vitamin B 12 is within normal limits. This will be followed.  Dilutional anemia.  Gout with exacerbation, off of gout medications.  Diet-controlled type 2 diabetes mellitus. Sliding scale NovoLog was started yesterday. Her hemoglobin A1c is 6.3.  Normal TSH and elevated free T4. This is subclinical hyperthyroidism. A free T3 will be ordered.  Elevated troponin I. This likely secondary to acute renal failure.      Plan:  We will leave the NG tube out for now. We'll try sips of clear liquids and ice chips. We will check a KUB today for interval findings.  We will restart the Cardizem drip with parameters to hold it for relative hypotension. We will discontinue IV metoprolol for now. We will consider digoxin IV as  needed.  Give her one dose of Solu-Medrol 60 mg, more if needed.  We'll change her IV fluids to half-normal saline rather than normal saline and follow her electrolytes.  We will monitor her serum calcium closely. We will consider giving her IV calcium, however, will wait until she's able to take oral calcium.  We will check a free T3.   LOS: 2 days   Bristal Steffy 09/18/2010, 9:44 AM

## 2010-09-18 NOTE — Consult Note (Signed)
Reason for Consult: Bowel obstruction Referring Physician: [facility] hospitalist  [name] [name] is an 77 y.o. female.  HPI: Patient presented to [facility] with increasing diffuse abdominal, colicky pain. Patient states that her last bowel movement was approximately 4 days ago. She states it was normal with no melena or hematochezia. Since that time she has had slowly progressing nausea. She has had some episodes of nonbloody emesis. The pain is increased over the last 24-48 hours. She denies any similar symptomatology in the past. She denies any fevers or chills. Her appetite is diminished. On evaluation today she does state that some of her symptoms have improved with bowel rest and gastric decompression.  Past Medical History  Diagnosis Date  . Atrial fibrillation   . Hypertension   . Hypercholesterolemia   . Seasonal allergies   . Acid reflux   . CHF (congestive heart failure)     Hx of diastolic CHF.  Marland Kitchen Gout   . Diverticular hemorrhage     2011  . Thrombocytopenia due to drugs     Allopurinol  . CKD (chronic kidney disease) stage 3, GFR 30-59 ml/min   . Hyperglycemia     Query DM 2    Past Surgical History  Procedure Date  . Cesarean section   . Cataract extraction   . Joint replacement     Total right knee 2007  . Appendectomy   . Tubal ligation     No family history on file.  Social History:  reports that she has never smoked. She does not have any smokeless tobacco history on file. She reports that she does not drink alcohol or use illicit drugs.  Allergies:  Allergies  Allergen Reactions  . Codeine Nausea And Vomiting  . Sulfa Antibiotics Nausea And Vomiting    Medications: I have reviewed the patient's current medications.  Results for orders placed during the hospital encounter of 09/16/10 (from the past 48 hour(s))  CULTURE, BLOOD (ROUTINE X 2)     Status: Normal (Preliminary result)   Collection Time   09/16/10  1:47 PM      Component Value Range  Comment   Specimen Description BLOOD LEFT HAND      Special Requests        Value: BOTTLES DRAWN AEROBIC AND ANAEROBIC 6CC EACH BOTTLE   Culture NO GROWTH 2 DAYS      Report Status PENDING     APTT     Status: Abnormal   Collection Time   09/16/10  1:48 PM      Component Value Range Comment   aPTT 48 (*) 24 - 37 (seconds)   PROTIME-INR     Status: Abnormal   Collection Time   09/16/10  1:48 PM      Component Value Range Comment   Prothrombin Time 32.7 (*) 11.6 - 15.2 (seconds)    INR 3.13 (*) 0.00 - 1.49    POCT I-STAT, CHEM 8     Status: Abnormal   Collection Time   09/16/10  2:04 PM      Component Value Range Comment   Sodium 139  135 - 145 (mEq/L)    Potassium 3.0 (*) 3.5 - 5.1 (mEq/L)    Chloride 102  96 - 112 (mEq/L)    BUN 72 (*) 6 - 23 (mg/dL)    Creatinine, Ser 6.90 (*) 0.50 - 1.10 (mg/dL)    Glucose, Bld 123 (*) 70 - 99 (mg/dL)    Calcium, Ion 0.83 (*) 1.12 -  1.32 (mmol/L)    TCO2 24  0 - 100 (mmol/L)    Hemoglobin 15.0  12.0 - 15.0 (g/dL)    HCT 44.0  36.0 - 46.0 (%)   URINALYSIS, ROUTINE W REFLEX MICROSCOPIC     Status: Abnormal   Collection Time   09/16/10  7:18 PM      Component Value Range Comment   Color, Urine YELLOW  YELLOW     Appearance HAZY (*) CLEAR     Specific Gravity, Urine >1.030 (*) 1.005 - 1.030     pH 5.5  5.0 - 8.0     Glucose, UA NEGATIVE  NEGATIVE (mg/dL)    Hgb urine dipstick TRACE (*) NEGATIVE     Bilirubin Urine SMALL (*) NEGATIVE     Ketones, ur TRACE (*) NEGATIVE (mg/dL)    Protein, ur TRACE (*) NEGATIVE (mg/dL)    Urobilinogen, UA 0.2  0.0 - 1.0 (mg/dL)    Nitrite NEGATIVE  NEGATIVE     Leukocytes, UA NEGATIVE  NEGATIVE    URINE MICROSCOPIC-ADD ON     Status: Abnormal   Collection Time   09/16/10  7:18 PM      Component Value Range Comment   Squamous Epithelial / LPF RARE  RARE     WBC, UA 0-2  <3 (WBC/hpf)    RBC / HPF 0-2  <3 (RBC/hpf)    Bacteria, UA MANY (*) RARE    MRSA PCR SCREENING     Status: Normal   Collection Time    09/16/10 10:45 PM      Component Value Range Comment   MRSA by PCR NEGATIVE  NEGATIVE    BASIC METABOLIC PANEL     Status: Abnormal   Collection Time   09/17/10 12:17 AM      Component Value Range Comment   Sodium 143  135 - 145 (mEq/L)    Potassium 3.5  3.5 - 5.1 (mEq/L)    Chloride 100  96 - 112 (mEq/L)    CO2 22  19 - 32 (mEq/L)    Glucose, Bld 104 (*) 70 - 99 (mg/dL)    BUN 76 (*) 6 - 23 (mg/dL)    Creatinine, Ser 5.46 (*) 0.50 - 1.10 (mg/dL)    Calcium 7.1 (*) 8.4 - 10.5 (mg/dL)    GFR calc non Af Amer 8 (*) >60 (mL/min)    GFR calc Af Amer 9 (*) >60 (mL/min)   MAGNESIUM     Status: Abnormal   Collection Time   09/17/10 12:17 AM      Component Value Range Comment   Magnesium 1.3 (*) 1.5 - 2.5 (mg/dL)   COMPREHENSIVE METABOLIC PANEL     Status: Abnormal   Collection Time   09/17/10  5:27 AM      Component Value Range Comment   Sodium 141  135 - 145 (mEq/L)    Potassium 3.1 (*) 3.5 - 5.1 (mEq/L)    Chloride 97  96 - 112 (mEq/L)    CO2 25  19 - 32 (mEq/L)    Glucose, Bld 164 (*) 70 - 99 (mg/dL)    BUN 77 (*) 6 - 23 (mg/dL)    Creatinine, Ser 5.02 (*) 0.50 - 1.10 (mg/dL)    Calcium 7.1 (*) 8.4 - 10.5 (mg/dL)    Total Protein 6.8  6.0 - 8.3 (g/dL)    Albumin 3.2 (*) 3.5 - 5.2 (g/dL)    AST 16  0 - 37 (U/L)    ALT 12  0 - 35 (U/L)    Alkaline Phosphatase 55  39 - 117 (U/L)    Total Bilirubin 1.4 (*) 0.3 - 1.2 (mg/dL)    GFR calc non Af Amer 8 (*) >60 (mL/min)    GFR calc Af Amer 10 (*) >60 (mL/min)   CBC     Status: Abnormal   Collection Time   09/17/10  5:27 AM      Component Value Range Comment   WBC 5.9  4.0 - 10.5 (K/uL)    RBC 4.23  3.87 - 5.11 (MIL/uL)    Hemoglobin 12.0  12.0 - 15.0 (g/dL)    HCT 35.4 (*) 36.0 - 46.0 (%)    MCV 83.7  78.0 - 100.0 (fL)    MCH 28.4  26.0 - 34.0 (pg)    MCHC 33.9  30.0 - 36.0 (g/dL)    RDW 16.0 (*) 11.5 - 15.5 (%)    Platelets 144 (*) 150 - 400 (K/uL)   PROTIME-INR     Status: Abnormal   Collection Time   09/17/10  5:27 AM       Component Value Range Comment   Prothrombin Time 40.5 (*) 11.6 - 15.2 (seconds)    INR 4.12 (*) 0.00 - 1.49    RENAL FUNCTION PANEL     Status: Abnormal   Collection Time   09/17/10  5:27 AM      Component Value Range Comment   Sodium 145  135 - 145 (mEq/L)    Potassium 3.5  3.5 - 5.1 (mEq/L)    Chloride 102  96 - 112 (mEq/L)    CO2 23  19 - 32 (mEq/L)    Glucose, Bld 101 (*) 70 - 99 (mg/dL)    BUN 79 (*) 6 - 23 (mg/dL)    Creatinine, Ser 5.53 (*) 0.50 - 1.10 (mg/dL)    Calcium 7.0 (*) 8.4 - 10.5 (mg/dL)    Phosphorus 7.2 (*) 2.3 - 4.6 (mg/dL)    Albumin 3.3 (*) 3.5 - 5.2 (g/dL)    GFR calc non Af Amer 7 (*) >60 (mL/min)    GFR calc Af Amer 9 (*) >60 (mL/min)   VITAMIN B12     Status: Normal   Collection Time   09/17/10  5:27 AM      Component Value Range Comment   Vitamin B-12 718  211 - 911 (pg/mL)   HEMOGLOBIN A1C     Status: Abnormal   Collection Time   09/17/10  5:27 AM      Component Value Range Comment   Hemoglobin A1C 6.3 (*) <5.7 (%)    Mean Plasma Glucose 134 (*) <117 (mg/dL)   GLUCOSE, CAPILLARY     Status: Abnormal   Collection Time   09/17/10 10:25 AM      Component Value Range Comment   Glucose-Capillary 120 (*) 70 - 99 (mg/dL)    Comment 1 Notify RN      Comment 2 Documented in Chart     GLUCOSE, CAPILLARY     Status: Abnormal   Collection Time   09/17/10  1:14 PM      Component Value Range Comment   Glucose-Capillary 116 (*) 70 - 99 (mg/dL)    Comment 1 Notify RN      Comment 2 Documented in Chart     GLUCOSE, CAPILLARY     Status: Abnormal   Collection Time   09/17/10  4:27 PM      Component Value Range Comment   Glucose-Capillary  110 (*) 70 - 99 (mg/dL)   GLUCOSE, CAPILLARY     Status: Abnormal   Collection Time   09/17/10  8:02 PM      Component Value Range Comment   Glucose-Capillary 117 (*) 70 - 99 (mg/dL)    Comment 1 Notify RN     GLUCOSE, CAPILLARY     Status: Abnormal   Collection Time   09/18/10 12:03 AM      Component Value Range Comment    Glucose-Capillary 128 (*) 70 - 99 (mg/dL)    Comment 1 Notify RN     GLUCOSE, CAPILLARY     Status: Abnormal   Collection Time   09/18/10  3:54 AM      Component Value Range Comment   Glucose-Capillary 150 (*) 70 - 99 (mg/dL)   PROTIME-INR     Status: Abnormal   Collection Time   09/18/10  5:49 AM      Component Value Range Comment   Prothrombin Time 46.0 (*) 11.6 - 15.2 (seconds)    INR 4.85 (*) 0.00 - 1.49    CBC     Status: Abnormal   Collection Time   09/18/10  5:49 AM      Component Value Range Comment   WBC 6.9  4.0 - 10.5 (K/uL)    RBC 4.11  3.87 - 5.11 (MIL/uL)    Hemoglobin 11.7 (*) 12.0 - 15.0 (g/dL)    HCT 35.7 (*) 36.0 - 46.0 (%)    MCV 86.9  78.0 - 100.0 (fL)    MCH 28.5  26.0 - 34.0 (pg)    MCHC 32.8  30.0 - 36.0 (g/dL)    RDW 16.3 (*) 11.5 - 15.5 (%)    Platelets 137 (*) 150 - 400 (K/uL)   MAGNESIUM     Status: Normal   Collection Time   09/18/10  5:49 AM      Component Value Range Comment   Magnesium 1.7  1.5 - 2.5 (mg/dL)   RENAL FUNCTION PANEL     Status: Abnormal   Collection Time   09/18/10  5:49 AM      Component Value Range Comment   Sodium 148 (*) 135 - 145 (mEq/L)    Potassium 4.8  3.5 - 5.1 (mEq/L) DELTA CHECK NOTED   Chloride 111  96 - 112 (mEq/L)    CO2 24  19 - 32 (mEq/L)    Glucose, Bld 151 (*) 70 - 99 (mg/dL)    BUN 54 (*) 6 - 23 (mg/dL)    Creatinine, Ser 2.89 (*) 0.50 - 1.10 (mg/dL)    Calcium 7.1 (*) 8.4 - 10.5 (mg/dL)    Phosphorus 2.7  2.3 - 4.6 (mg/dL)    Albumin 3.2 (*) 3.5 - 5.2 (g/dL)    GFR calc non Af Amer 16 (*) >60 (mL/min)    GFR calc Af Amer 19 (*) >60 (mL/min)   GLUCOSE, CAPILLARY     Status: Abnormal   Collection Time   09/18/10  7:13 AM      Component Value Range Comment   Glucose-Capillary 144 (*) 70 - 99 (mg/dL)    Comment 1 Notify RN      Comment 2 Documented in Chart     GLUCOSE, CAPILLARY     Status: Abnormal   Collection Time   09/18/10 11:32 AM      Component Value Range Comment   Glucose-Capillary 175 (*) 70 -  99 (mg/dL)    Comment 1 Notify RN  Comment 2 Documented in Chart       Ct Abdomen Pelvis Wo Contrast  09/16/2010  *RADIOLOGY REPORT*  Clinical Data: Abdominal pain, vomiting  CT ABDOMEN AND PELVIS WITHOUT CONTRAST  Technique:  Multidetector CT imaging of the abdomen and pelvis was performed following the standard protocol without intravenous contrast.  Comparison: 07/14/2009  Findings: Lung bases are essentially clear.  Liver is notable for a punctate calcified granuloma.  Spleen, pancreas, and adrenal glands within normal limits.  Gallbladder is notable for layering sludge/stones, without associated inflammatory changes.  No intrahepatic or extrahepatic ductal dilatation.  Kidneys are grossly unremarkable.  No hydronephrosis.  Multiple dilated loops of small bowel, with transition in the right lower abdomen (series 4/image 35), compatible with high grade small bowel obstruction.  Decompressed colon with numerous colonic diverticula, without associated inflammatory changes.  Small volume perihepatic and pelvic ascites.  Uterus unremarkable.  No adnexal masses.  Bladder is decompressed by Foley catheter.  Degenerative changes of the visualized thoracolumbar spine.  IMPRESSION: High grade small bowel obstruction with transition in the right lower abdomen, as described above.  Original Report Authenticated By: Julian Hy, M.D.   Dg Chest 1 View  09/16/2010  *RADIOLOGY REPORT*  Clinical Data: 77 year old female with hypotension, tachycardia, vomiting.  CHEST - 1 VIEW  Comparison: Upright AP view at 1610 hours.  Findings: Chronic cardiomegaly.  Stable cardiac size and mediastinal contours.  Lordotic view.  Lung volumes appear stable. No pneumothorax, pulmonary edema, pleural effusion or confluent pulmonary opacity identified.  IMPRESSION: Stable cardiomegaly.  No acute cardiopulmonary abnormality.  Original Report Authenticated By: Randall An, M.D.   US Renal  09/17/2010  *RADIOLOGY REPORT*   Clinical Data: 77 year old female with acute renal failure.  RENAL/URINARY TRACT ULTRASOUND COMPLETE  Comparison:  CTs of the abdomen and pelvis 09/16/2010 and earlier. Abdomen MRI 11/26/2008.  Findings:  Right Kidney:  No hydronephrosis.  Renal length 10.5 cm.  Cortical echotexture and thickness within normal limits for age.  Occasional sub centimeter simple appearing cortical cysts.  Trace fluid in the the anterior pararenal space.  Left Kidney:  No hydronephrosis.  Renal length 10.1 cm.  Renal cortical thickness and echotexture within normal limits for age. Lobulated appearance of the left kidney appears stable from prior exams.  No focal left renal lesion.  Trace pararenal fluid.  Bladder:  None identified, reportedly decompressed by Foley catheter.  Other findings:  Fluid filled bowel loops.  IMPRESSION: 1.  No acute renal findings. 2.  Trace pararenal fluid and multiple fluid filled bowel loops, see abdomen and pelvis CT report 09/16/2010.  Original Report Authenticated By: Randall An, M.D.    Review of Systems  Constitutional: Negative.   HENT: Negative.   Eyes: Negative.   Respiratory: Negative.   Cardiovascular: Negative.   Gastrointestinal: Positive for heartburn, nausea, vomiting, abdominal pain (diffuse colicky in nature) and constipation (Last bowel movement was 4 days prior). Negative for diarrhea, blood in stool and melena.  Genitourinary: Negative.   Musculoskeletal: Negative.   Skin: Negative.   Neurological: Negative.   Endo/Heme/Allergies: Bruises/bleeds easily.  Psychiatric/Behavioral: Negative.    Blood pressure 132/72, pulse 79, temperature 97.7 F (36.5 C), temperature source Oral, resp. rate 24, height 5\' 5"  (1.651 m), weight 74.6 kg (164 lb 7.4 oz), SpO2 95.00%. Physical Exam  Constitutional: She is oriented to person, place, and time. She appears well-developed and well-nourished.       Moderately obese  HENT:  Head: Normocephalic and atraumatic.  Eyes:  Conjunctivae and EOM are normal. Pupils are equal, round, and reactive to light.  Neck: Normal range of motion. No tracheal deviation present. No thyromegaly present.  Cardiovascular: Normal rate.        Irregularly irregular rhythm.  Respiratory: Effort normal and breath sounds normal. No respiratory distress. She has no wheezes.  GI: Soft. She exhibits distension (moderate distention). She exhibits no mass. There is tenderness (Mild diffuse tenderness, no peritoneal signs.). There is no rebound and no guarding.       Bowel sounds are quiet  Musculoskeletal: She exhibits no edema.  Lymphadenopathy:    She has no cervical adenopathy.  Neurological: She is alert and oriented to person, place, and time.  Skin: Skin is warm and dry.    Assessment/Plan: Small bowel obstruction. At this point patient's symptoms have somewhat improved with conservative management. I would highly recommend continuing conservative management with bowel rest, IV fluid, and pain control. Patient's history of atrial fibrillation appears to be controlled on current medications. Her rate is controlled. Patient is still supratherapeutic on Coumadin despite this being discontinued on admission by the hospitalist. She is obviously at increased risk of bleeding should surgical intervention be required. However at this point I discussed with patient surgical indications including increasing leukocytosis, increasing pain, increasing temperature despite conservative management. Additionally should she not progress this would also be an indication to proceed to the operating room however prior to proceeding I would require that the INR be in the normal range. Patient understands this and understands planned course. Proceed to obtain to assist in her care we'll continue to follow during her admission.  Jonthan Leite C 09/18/2010, 1:34 PM

## 2010-09-18 NOTE — Progress Notes (Signed)
Subjective: She denies any nausea. Nasogastric tube did become dislodged last night but she has not had any worsening of her symptomatology. Her domino pain remains resolved. She's not had any flatus. She denies any fever chills.  Objective: Vital signs in last 24 hours: Temp:  [97.6 F (36.4 C)-98.6 F (37 C)] 97.7 F (36.5 C) (08/13 1200) Pulse Rate:  [36-119] 79  (08/13 1300) Resp:  [17-26] 24  (08/13 1300) BP: (67-142)/(36-84) 132/72 mmHg (08/13 1300) SpO2:  [86 %-100 %] 95 % (08/13 1300) Weight:  [74.6 kg (164 lb 7.4 oz)] 164 lb 7.4 oz (74.6 kg) (08/13 0600) Last BM Date: 09/09/10  Intake/Output from previous day: 08/12 0701 - 08/13 0700 In: 4983.3 [I.V.:4983.3] Out: 3800 [Urine:1400; Emesis/NG output:2400] Intake/Output this shift: I/O this shift: In: -  Out: 400 [Urine:400]  General appearance: alert and no distress GI: Diminished abdominal sounds, soft, mild distention, no abdominal pain, no masses, no peritoneal signs.  Lab Results:  @LABLAST2 (wbc:2,hgb:2,hct:2,plt:2) BMET  [facility] 09/18/10 0549 09/17/10 0527  NA 148* 141145  K 4.8 3.1*3.5  CL 111 97102  CO2 24 2523  GLUCOSE 151* 164*101*  BUN 54* 77*79*  CREATININE 2.89* 5.02*5.53*  CALCIUM 7.1* 7.1*7.0*   PT/INR  Basename 09/18/10 0549 09/17/10 0527  LABPROT 46.0* 40.5*  INR 4.85* 4.12*   ABG No results found for this basename: PHART:2,PCO2:2,PO2:2,HCO3:2 in the last 72 hours  Studies/Results: Ct Abdomen Pelvis Wo Contrast  09/16/2010  *RADIOLOGY REPORT*  Clinical Data: Abdominal pain, vomiting  CT ABDOMEN AND PELVIS WITHOUT CONTRAST  Technique:  Multidetector CT imaging of the abdomen and pelvis was performed following the standard protocol without intravenous contrast.  Comparison: 07/14/2009  Findings: Lung bases are essentially clear.  Liver is notable for a punctate calcified granuloma.  Spleen, pancreas, and adrenal glands within normal limits.  Gallbladder is notable for layering  sludge/stones, without associated inflammatory changes.  No intrahepatic or extrahepatic ductal dilatation.  Kidneys are grossly unremarkable.  No hydronephrosis.  Multiple dilated loops of small bowel, with transition in the right lower abdomen (series 4/image 35), compatible with high grade small bowel obstruction.  Decompressed colon with numerous colonic diverticula, without associated inflammatory changes.  Small volume perihepatic and pelvic ascites.  Uterus unremarkable.  No adnexal masses.  Bladder is decompressed by Foley catheter.  Degenerative changes of the visualized thoracolumbar spine.  IMPRESSION: High grade small bowel obstruction with transition in the right lower abdomen, as described above.  Original Report Authenticated By: [name] [name], M.D.   Dg Chest 1 View  09/16/2010  *RADIOLOGY REPORT*  Clinical Data: 77 year old female with hypotension, tachycardia, vomiting.  CHEST - 1 VIEW  Comparison: Upright AP view at 1610 hours.  Findings: Chronic cardiomegaly.  Stable cardiac size and mediastinal contours.  Lordotic view.  Lung volumes appear stable. No pneumothorax, pulmonary edema, pleural effusion or confluent pulmonary opacity identified.  IMPRESSION: Stable cardiomegaly.  No acute cardiopulmonary abnormality.  Original Report Authenticated By: [name] [name], M.D.   US Renal  09/17/2010  *RADIOLOGY REPORT*  Clinical Data: 77 year old female with acute renal failure.  RENAL/URINARY TRACT ULTRASOUND COMPLETE  Comparison:  CTs of the abdomen and pelvis 09/16/2010 and earlier. Abdomen MRI 11/26/2008.  Findings:  Right Kidney:  No hydronephrosis.  Renal length 10.5 cm.  Cortical echotexture and thickness within normal limits for age.  Occasional sub centimeter simple appearing cortical cysts.  Trace fluid in the the anterior pararenal space.  Left Kidney:  No hydronephrosis.  Renal length 10.1 cm.  Renal cortical  thickness and echotexture within normal limits for age. Lobulated appearance  of the left kidney appears stable from prior exams.  No focal left renal lesion.  Trace pararenal fluid.  Bladder:  None identified, reportedly decompressed by Foley catheter.  Other findings:  Fluid filled bowel loops.  IMPRESSION: 1.  No acute renal findings. 2.  Trace pararenal fluid and multiple fluid filled bowel loops, see abdomen and pelvis CT report 09/16/2010.  Original Report Authenticated By: [name] [name], M.D.    Anti-infectives: Anti-infectives    None      Assessment/Plan: s/p  Continue bowel rest. Continue IV fluid hydration. Should her nausea resume or be refractory to medications or to recommend replacing the nasogastric tube. Continue to follow the patient although there is no acute surgical findings at this point. Hopefully she will respond to conservative management of her small bowel resection. However it should she fail we'll plan to proceed to the operating room once the INR has normalized.  LOS: 2 days    [name] [name] C 09/18/2010

## 2010-09-18 NOTE — Progress Notes (Signed)
eLink Physician-Brief Progress Note Patient Name: [name] [name] DOB: Nov 28, 1930 MRN: [id number]  Date of Service  09/18/2010   HPI/Events of Note  HR gradually elevated over the past few hours.  Will give prn metoprolol and IVF bolus.  Patient may need to go back on home cardizem.   eICU Interventions     Intervention Category Intermediate Interventions: Arrhythmia - evaluation and management  Arrabella Westerman 09/18/2010, 3:27 AM

## 2010-09-19 LAB — PROTIME-INR
INR: 5.47 (ref 0.00–1.49)
Prothrombin Time: 50.6 seconds — ABNORMAL HIGH (ref 11.6–15.2)

## 2010-09-19 LAB — GLUCOSE, CAPILLARY
Glucose-Capillary: 134 mg/dL — ABNORMAL HIGH (ref 70–99)
Glucose-Capillary: 143 mg/dL — ABNORMAL HIGH (ref 70–99)
Glucose-Capillary: 146 mg/dL — ABNORMAL HIGH (ref 70–99)
Glucose-Capillary: 158 mg/dL — ABNORMAL HIGH (ref 70–99)
Glucose-Capillary: 155 mg/dL — ABNORMAL HIGH (ref 70–99)
Glucose-Capillary: 140 mg/dL — ABNORMAL HIGH (ref 70–99)

## 2010-09-19 LAB — RENAL FUNCTION PANEL
Albumin: 2.7 g/dL — ABNORMAL LOW (ref 3.5–5.2)
BUN: 45 mg/dL — ABNORMAL HIGH (ref 6–23)
CO2: 20 mEq/L (ref 19–32)
Calcium: 8.2 mg/dL — ABNORMAL LOW (ref 8.4–10.5)
Chloride: 109 mEq/L (ref 96–112)
Creatinine, Ser: 1.93 mg/dL — ABNORMAL HIGH (ref 0.50–1.10)
GFR calc Af Amer: 30 mL/min — ABNORMAL LOW (ref >60)
GFR calc non Af Amer: 25 mL/min — ABNORMAL LOW (ref >60)
Glucose, Bld: 141 mg/dL — ABNORMAL HIGH (ref 70–99)
Phosphorus: 2.6 mg/dL (ref 2.3–4.6)
Potassium: 5.1 mEq/L (ref 3.5–5.1)
Sodium: 143 mEq/L (ref 135–145)

## 2010-09-19 LAB — CBC
HCT: 32.1 % — ABNORMAL LOW (ref 36.0–46.0)
Hemoglobin: 10.6 g/dL — ABNORMAL LOW (ref 12.0–15.0)
MCH: 28.6 pg (ref 26.0–34.0)
MCHC: 33 g/dL (ref 30.0–36.0)
MCV: 86.8 fL (ref 78.0–100.0)
Platelets: 110 10*3/uL — ABNORMAL LOW (ref 150–400)
RBC: 3.7 MIL/uL — ABNORMAL LOW (ref 3.87–5.11)
RDW: 16.6 % — ABNORMAL HIGH (ref 11.5–15.5)
WBC: 10.2 10*3/uL (ref 4.0–10.5)

## 2010-09-19 LAB — T3, FREE: T3, Free: 1.4 pg/mL — ABNORMAL LOW (ref 2.3–4.2)

## 2010-09-19 LAB — DIC (DISSEMINATED INTRAVASCULAR COAGULATION)PANEL
D-Dimer, Quant: 1.31 ug/mL-FEU — ABNORMAL HIGH (ref 0.00–0.48)
Fibrinogen: 565 mg/dL — ABNORMAL HIGH (ref 204–475)
INR: 4.98 — ABNORMAL HIGH (ref 0.00–1.49)
Prothrombin Time: 47 seconds — ABNORMAL HIGH (ref 11.6–15.2)

## 2010-09-19 LAB — PATHOLOGIST SMEAR REVIEW

## 2010-09-19 LAB — MAGNESIUM: Magnesium: 1.6 mg/dL (ref 1.5–2.5)

## 2010-09-19 LAB — DIC (DISSEMINATED INTRAVASCULAR COAGULATION) PANEL (NOT AT ARMC)
Platelets: 110 10*3/uL — ABNORMAL LOW (ref 150–400)
aPTT: 86 seconds — ABNORMAL HIGH (ref 24–37)

## 2010-09-19 MED ORDER — METOPROLOL TARTRATE 1 MG/ML IV SOLN
5.0000 mg | INTRAVENOUS | Status: DC
  Administered 2010-09-19 – 2010-09-26 (×42): 5 mg via INTRAVENOUS
  Filled 2010-09-19 (×42): qty 5

## 2010-09-19 MED ORDER — METHYLPREDNISOLONE SODIUM SUCC 40 MG IJ SOLR
20.0000 mg | Freq: Once | INTRAMUSCULAR | Status: AC
  Administered 2010-09-19: 20 mg via INTRAVENOUS
  Filled 2010-09-19: qty 1

## 2010-09-19 MED ORDER — BISACODYL 10 MG RE SUPP
10.0000 mg | Freq: Once | RECTAL | Status: AC
  Administered 2010-09-19: 10 mg via RECTAL
  Filled 2010-09-19: qty 1

## 2010-09-19 MED ORDER — SODIUM CHLORIDE 0.9 % IJ SOLN
INTRAMUSCULAR | Status: AC
  Filled 2010-09-19: qty 10

## 2010-09-19 MED ORDER — SODIUM CHLORIDE 0.9 % IJ SOLN
INTRAMUSCULAR | Status: AC
  Administered 2010-09-19: 10 mL
  Filled 2010-09-19: qty 10

## 2010-09-19 NOTE — Progress Notes (Signed)
Subjective: Mild nausea, but no vomiting with NG tube out. Tolerating sips of clear liquids so far. No BM or passing flatus yet. Still has  Right foot pain from gout.   Objective: Vital signs in last 24 hours: Filed Vitals:   09/19/10 0600 09/19/10 0615 09/19/10 0630 09/19/10 0700  BP: 156/84 135/76 119/83 127/67  Pulse: 93 92 97 99  Temp:    97.7 F (36.5 C)  TempSrc:      Resp: 19 19 18 19   Height:      Weight:      SpO2: 98% 97% 97%     Intake/Output Summary (Last 24 hours) at 09/19/10 [id number] Last data filed at 09/19/10 0500  Gross per 24 hour  Intake   2230 ml  Output   1000 ml  Net   1230 ml    Weight change: 2.4 kg (5 lb 4.7 oz)  General appearance: alert, cooperative and appears stated age. Resp: clear to auscultation bilaterally, decreased breath sounds in the bases. Cardio: irregularly irregular rhythm, with tachycardia. GI: soft, mild epigastric tenderness; hypoactive bowel sounds normal; no masses,  no organomegaly Extremities: extremities normal, atraumatic, no cyanosis or edema. Right foot with mild erythema and edema globally and moderate tenderness over the first MTP joint.  Lab Results: Basic Metabolic Panel:  Basename 09/19/10 0534 09/19/10 0533 09/18/10 0549  NA 143 -- 148*  K 5.1 -- 4.8  CL 109 -- 111  CO2 20 -- 24  GLUCOSE 141* -- 151*  BUN 45* -- 54*  CREATININE 1.93* -- 2.89*  CALCIUM 8.2* -- 7.1*  MG -- 1.6 1.7  PHOS 2.6 -- 2.7   Liver Function Tests:  Basename 09/19/10 0534 09/18/10 0549 09/17/10 0527 09/16/10 1329  AST -- -- 16 17  ALT -- -- 12 16  ALKPHOS -- -- 55 69  BILITOT -- -- 1.4* 2.0*  PROT -- -- 6.8 7.9  ALBUMIN 2.7* 3.2* -- --   No results found for this basename: LIPASE:2,AMYLASE:2 in the last 72 hours CBC:  Basename 09/19/10 0533 09/18/10 0549 09/16/10 1329  WBC 10.2 6.9 --  NEUTROABS -- -- 7.6  HGB 10.6* 11.7* --  HCT 32.1* 35.7* --  MCV 86.8 86.9 --  PLT 110* 137* --   Cardiac Enzymes:  Basename 09/16/10  1330  CKTOTAL 75  CKMB 3.1  CKMBINDEX --  TROPONINI 0.48*   BNP: No results found for this basename: POCBNP:3 in the last 72 hours D-Dimer: No results found for this basename: DDIMER:2 in the last 72 hours CBG:  Basename 09/18/10 1940 09/18/10 1559 09/18/10 1132 09/18/10 0713 09/18/10 0354 09/18/10 0003  GLUCAP 183* 195* 175* 144* 150* 128*   Hemoglobin A1C:  Basename 09/17/10 0527  HGBA1C 6.3*   Fasting Lipid Panel: No results found for this basename: CHOL,HDL,LDLCALC,TRIG,CHOLHDL,LDLDIRECT in the last 72 hours Thyroid Function Tests:  Basename 09/16/10 1330  TSH 4.133  T4TOTAL --  FREET4 2.08*  T3FREE 1.4*  THYROIDAB --   Anemia Panel:  Basename 09/17/10 0527  VITAMINB12 718  FOLATE --  FERRITIN --  TIBC --  IRON --  RETICCTPCT --    Urinalysis: Urine color yellow, specific gravity greater than 1.030, pH 5.5, bilirubin small, glucose negative, ketones trace, protein trace, 0-2 WBCs, 0-2 RBCs, many bacteria, rare squamous cells.  Misc. Labs:   Micro: Recent Results (from the past 240 hour(s))  CULTURE, BLOOD (ROUTINE X 2)     Status: Normal (Preliminary result)   Collection Time   09/16/10  1:30 PM      Component Value Range Status Comment   Specimen Description BLOOD LEFT ANTECUBITAL   Final    Special Requests     Final    Value: BOTTLES DRAWN AEROBIC AND ANAEROBIC 6CC EACH BOTTLE   Culture NO GROWTH 2 DAYS   Final    Report Status PENDING   Incomplete   CULTURE, BLOOD (ROUTINE X 2)     Status: Normal (Preliminary result)   Collection Time   09/16/10  1:47 PM      Component Value Range Status Comment   Specimen Description BLOOD LEFT HAND   Final    Special Requests     Final    Value: BOTTLES DRAWN AEROBIC AND ANAEROBIC 6CC EACH BOTTLE   Culture NO GROWTH 2 DAYS   Final    Report Status PENDING   Incomplete   URINE CULTURE     Status: Normal   Collection Time   09/16/10  7:18 PM      Component Value Range Status Comment   Specimen Description  URINE, CATHETERIZED   Final    Special Requests NONE   Final    Setup Time [id number]   Final    Colony Count NO GROWTH   Final    Culture NO GROWTH   Final    Report Status 09/18/2010 FINAL   Final   MRSA PCR SCREENING     Status: Normal   Collection Time   09/16/10 10:45 PM      Component Value Range Status Comment   MRSA by PCR NEGATIVE  NEGATIVE  Final     Studies/Results: Ct Abdomen Pelvis Wo Contrast  09/16/2010  *RADIOLOGY REPORT*  Clinical Data: Abdominal pain, vomiting  CT ABDOMEN AND PELVIS WITHOUT CONTRAST  Technique:  Multidetector CT imaging of the abdomen and pelvis was performed following the standard protocol without intravenous contrast.  Comparison: 07/14/2009  Findings: Lung bases are essentially clear.  Liver is notable for a punctate calcified granuloma.  Spleen, pancreas, and adrenal glands within normal limits.  Gallbladder is notable for layering sludge/stones, without associated inflammatory changes.  No intrahepatic or extrahepatic ductal dilatation.  Kidneys are grossly unremarkable.  No hydronephrosis.  Multiple dilated loops of small bowel, with transition in the right lower abdomen (series 4/image 35), compatible with high grade small bowel obstruction.  Decompressed colon with numerous colonic diverticula, without associated inflammatory changes.  Small volume perihepatic and pelvic ascites.  Uterus unremarkable.  No adnexal masses.  Bladder is decompressed by Foley catheter.  Degenerative changes of the visualized thoracolumbar spine.  IMPRESSION: High grade small bowel obstruction with transition in the right lower abdomen, as described above.  Original Report Authenticated By: Julian Hy, M.D.   Dg Chest 1 View  09/16/2010  *RADIOLOGY REPORT*  Clinical Data: 77 year old female with hypotension, tachycardia, vomiting.  CHEST - 1 VIEW  Comparison: Upright AP view at 1610 hours.  Findings: Chronic cardiomegaly.  Stable cardiac size and mediastinal contours.   Lordotic view.  Lung volumes appear stable. No pneumothorax, pulmonary edema, pleural effusion or confluent pulmonary opacity identified.  IMPRESSION: Stable cardiomegaly.  No acute cardiopulmonary abnormality.  Original Report Authenticated By: Randall An, M.D.    Medications: I have reviewed the patient's current medications.  Assessment/Plan: Nausea and vomiting, secondary to small bowel obstruction and possibly uremia. Resolving. The NG tube was inadvertently pulled out 2 days ago. We will leave out for now. She is currently asymptomatic.  Small bowel obstruction, high grade  per CT scan. Clinically she is improving. Her abdomen is nondistended and minimally tender. Will not advance diet yet. KUB unchanged. Will try dulcolax suppository today.   Acute renal failure. History of stage III chronic kidney disease. She is now making urine. Her renal function has improved progressively.  Hypotension. This has been transient secondary to rate limiting medications. We'll consider digoxin, however, I do not want to exacerbate or trigger recurrent nausea and vomiting.  Chronic atrial fibrillation.  Back on Cardizem drip, HR better. Will restart IV metoprolol, because of AFib and hyperthyroidism.  Coagulopathy, off of coumadin. Will follow.  Electrolyte abnormalities including hypernatremia, hypercalcemia, hypokalemia, and hypomagnesemia. She has received intravenous magnesium and potassium chloride in her IV fluids. Improving. Will recheck in am.  Thrombocytopenia. The decrease in her platelet count may be in part dilutional. Her vitamin B 12 is within normal limits. This will be followed. We will order a blood smear for pathologist to reveiw and DIC panel. Consider Heme/Onc consult.  Dilutional anemia.  Gout with exacerbation, off of gout medications. S/p IV solumedrol yesterday. Will give another small dose today.  Diet-controlled type 2 diabetes mellitus. Sliding scale NovoLog was started  yesterday. Her hemoglobin A1c is 6.3.  Hyperthyroidism, per labs.  Endocrinology will be consulted once GI issues are better .  Elevated troponin I. This likely secondary to acute renal failure.      LOS: 3 days   [name] [name] 09/19/2010, 9:18 AM

## 2010-09-19 NOTE — Progress Notes (Signed)
  Subjective: Denies any nausea. No abdominal pain. Small amount of flatus. No bowel movement.  Objective: Vital signs in last 24 hours: Temp:  [97.3 F (36.3 C)-97.7 F (36.5 C)] 97.7 F (36.5 C) (08/14 0700) Pulse Rate:  [54-168] 92  (08/14 1330) Resp:  [17-35] 23  (08/14 1330) BP: (97-166)/(59-112) 151/73 mmHg (08/14 1330) SpO2:  [93 %-98 %] 97 % (08/14 1330) Weight:  [77 kg (169 lb 12.1 oz)] 169 lb 12.1 oz (77 kg) (08/14 0500) Last BM Date: 09/09/10  Intake/Output from previous day: 08/13 0701 - 08/14 0700 In: 2430 [P.O.:300; I.V.:2130] Out: 1400 [Urine:1400] Intake/Output this shift:    General appearance: alert and no distress GI: Diminished but present bowel sounds. Abdomen is soft with mild lower abdominal discomfort. No peritoneal signs. No significant distention.  Lab Results:  @LABLAST2 (wbc:2,hgb:2,hct:2,plt:2) BMET  [facility] 09/19/10 0534 09/18/10 0549  NA 143 148*  K 5.1 4.8  CL 109 111  CO2 20 24  GLUCOSE 141* 151*  BUN 45* 54*  CREATININE 1.93* 2.89*  CALCIUM 8.2* 7.1*   PT/INR  Basename 09/19/10 0533 09/18/10 0549  LABPROT 50.6*47.0* 46.0*  INR 5.47*4.98* 4.85*   ABG No results found for this basename: PHART:2,PCO2:2,PO2:2,HCO3:2 in the last 72 hours  Studies/Results: Dg Abd 1 View  09/18/2010  *RADIOLOGY REPORT*  Clinical Data: Small bowel obstruction  ABDOMEN - 1 VIEW  Comparison: CT abdomen and pelvis 09/16/2010  Findings: Paucity of colonic gas. Persistent dilatation of a small bowel loop in the mid abdomen, 4 mm diameter. Findings remain compatible with small bowel obstruction. Bones appear demineralized with degenerative disc and facet disease changes lumbar spine. Few scattered vascular calcifications. No definite urinary tract calcification.  IMPRESSION: Persistent small bowel dilatation consistent with small bowel obstruction. Paucity of colonic gas.  Original Report Authenticated By: Burnetta Sabin, M.D.     Anti-infectives: Anti-infectives    None      Assessment/Plan: s/p  SBO. Continue limited by mouth with only ice chips and sips water for now. Continue to await improve bowel function. Indications again discussed with patient and family regarding surgical indications. Patient is still supratherapeutic on Coumadin. No acute surgical indications at this time. Hopefully patient will resolve with conservative management.  LOS: 3 days    Enrica Corliss C 09/19/2010

## 2010-09-19 NOTE — Progress Notes (Signed)
Miss Tench  INR=5.47 and DR Caryn Section

## 2010-09-20 ENCOUNTER — Inpatient Hospital Stay (HOSPITAL_COMMUNITY): Payer: Medicare Other

## 2010-09-20 LAB — CBC
HCT: 32.6 % — ABNORMAL LOW (ref 36.0–46.0)
Hemoglobin: 10.6 g/dL — ABNORMAL LOW (ref 12.0–15.0)
MCH: 28 pg (ref 26.0–34.0)
MCHC: 32.5 g/dL (ref 30.0–36.0)
MCV: 86 fL (ref 78.0–100.0)
Platelets: 144 10*3/uL — ABNORMAL LOW (ref 150–400)
RBC: 3.79 MIL/uL — ABNORMAL LOW (ref 3.87–5.11)
RDW: 16.7 % — ABNORMAL HIGH (ref 11.5–15.5)
WBC: 11.7 10*3/uL — ABNORMAL HIGH (ref 4.0–10.5)

## 2010-09-20 LAB — GLUCOSE, CAPILLARY
Glucose-Capillary: 103 mg/dL — ABNORMAL HIGH (ref 70–99)
Glucose-Capillary: 116 mg/dL — ABNORMAL HIGH (ref 70–99)
Glucose-Capillary: 136 mg/dL — ABNORMAL HIGH (ref 70–99)
Glucose-Capillary: 138 mg/dL — ABNORMAL HIGH (ref 70–99)
Glucose-Capillary: 149 mg/dL — ABNORMAL HIGH (ref 70–99)

## 2010-09-20 LAB — BASIC METABOLIC PANEL
BUN: 43 mg/dL — ABNORMAL HIGH (ref 6–23)
CO2: 17 mEq/L — ABNORMAL LOW (ref 19–32)
Calcium: 8.5 mg/dL (ref 8.4–10.5)
Chloride: 109 mEq/L (ref 96–112)
Creatinine, Ser: 1.49 mg/dL — ABNORMAL HIGH (ref 0.50–1.10)
GFR calc Af Amer: 41 mL/min — ABNORMAL LOW (ref >60)
GFR calc non Af Amer: 34 mL/min — ABNORMAL LOW (ref >60)
Glucose, Bld: 143 mg/dL — ABNORMAL HIGH (ref 70–99)
Potassium: 5.3 mEq/L — ABNORMAL HIGH (ref 3.5–5.1)
Sodium: 139 mEq/L (ref 135–145)

## 2010-09-20 LAB — PROTIME-INR
INR: 3.71 — ABNORMAL HIGH (ref 0.00–1.49)
Prothrombin Time: 37.3 seconds — ABNORMAL HIGH (ref 11.6–15.2)

## 2010-09-20 MED ORDER — METOCLOPRAMIDE HCL 5 MG/ML IJ SOLN
10.0000 mg | Freq: Four times a day (QID) | INTRAMUSCULAR | Status: DC
  Administered 2010-09-20 – 2010-09-23 (×10): 10 mg via INTRAVENOUS
  Filled 2010-09-20 (×10): qty 2

## 2010-09-20 MED ORDER — VITAMIN K1 10 MG/ML IJ SOLN
5.0000 mg | Freq: Once | INTRAMUSCULAR | Status: AC
  Administered 2010-09-20: 5 mg via SUBCUTANEOUS
  Filled 2010-09-20: qty 1

## 2010-09-20 MED ORDER — SODIUM CHLORIDE 0.9 % IJ SOLN
INTRAMUSCULAR | Status: AC
  Filled 2010-09-20: qty 10

## 2010-09-20 MED ORDER — SODIUM CHLORIDE 0.9 % IJ SOLN
INTRAMUSCULAR | Status: AC
  Administered 2010-09-20: 21:00:00
  Filled 2010-09-20: qty 10

## 2010-09-20 MED ORDER — SODIUM CHLORIDE 0.9 % IJ SOLN
INTRAMUSCULAR | Status: AC
  Administered 2010-09-20: 20:00:00
  Filled 2010-09-20: qty 10

## 2010-09-20 MED ORDER — SODIUM CHLORIDE 0.9 % IJ SOLN
INTRAMUSCULAR | Status: AC
  Administered 2010-09-20: 21:00:00
  Filled 2010-09-20: qty 3

## 2010-09-20 MED ORDER — SODIUM BICARBONATE 8.4 % IV SOLN
INTRAVENOUS | Status: DC
  Administered 2010-09-20 – 2010-09-21 (×2): via INTRAVENOUS
  Filled 2010-09-20 (×3): qty 1000

## 2010-09-20 MED ORDER — SODIUM CHLORIDE 0.9 % IJ SOLN
INTRAMUSCULAR | Status: AC
  Administered 2010-09-20: 20 mL
  Filled 2010-09-20: qty 20

## 2010-09-20 NOTE — Progress Notes (Addendum)
Subjective: She is much more nauseated today. She has received Zofran and Phenergan without any relief. She has a lot of reflux which she is currently spitting out. The nurse reports a small bowel movement yesterday. The patient's daughter reports that it was mainly mucus. The patient denies any flatus today, just belching. She has been taking small sips of soda. She's not having much abdominal pain.  Objective: Vital signs in last 24 hours: Filed Vitals:   09/20/10 1100 09/20/10 1115 09/20/10 1130 09/20/10 1145  BP: 156/91     Pulse: 94 97 96 104  Temp:      TempSrc:      Resp: 23 22 21 22   Height:      Weight:      SpO2: 97% 95% 95% 94%    Intake/Output Summary (Last 24 hours) at 09/20/10 1242 Last data filed at 09/20/10 0800  Gross per 24 hour  Intake 1411.67 ml  Output    700 ml  Net 711.67 ml   Tele: a fib rate around 100  Weight change: 3.3 kg (7 lb 4.4 oz)  General appearance: Uncomfortable. Belching. Spitting out bilious reflux. Resp: clear to auscultation bilaterally, without wheezes rhonchi or rales Cardio: irregularly irregular rhythm, with tachycardia. GI: soft, nondistended; hypoactive bowel sounds normal; no masses,  no organomegaly Extremities: extremities normal, atraumatic, no cyanosis or edema.   Lab Results: Basic Metabolic Panel:  Basename 09/20/10 0507 09/19/10 0534 09/19/10 0533 09/18/10 0549  NA 139 143 -- --  K 5.3* 5.1 -- --  CL 109 109 -- --  CO2 17* 20 -- --  GLUCOSE 143* 141* -- --  BUN 43* 45* -- --  CREATININE 1.49* 1.93* -- --  CALCIUM 8.5 8.2* -- --  MG -- -- 1.6 1.7  PHOS -- 2.6 -- 2.7   Liver Function Tests:  Basename 09/19/10 0534 09/18/10 0549  AST -- --  ALT -- --  ALKPHOS -- --  BILITOT -- --  PROT -- --  ALBUMIN 2.7* 3.2*   No results found for this basename: LIPASE:2,AMYLASE:2 in the last 72 hours CBC:  Basename 09/20/10 0507 09/19/10 0533  WBC 11.7* 10.2  NEUTROABS -- --  HGB 10.6* 10.6*  HCT 32.6* 32.1*  MCV  86.0 86.8  PLT 144* 110*110*   Cardiac Enzymes: No results found for this basename: CKTOTAL:3,CKMB:3,CKMBINDEX:3,TROPONINI:3 in the last 72 hours BNP: No results found for this basename: POCBNP:3 in the last 72 hours D-Dimer:  [facility] 09/19/10 0533  DDIMER 1.31*   CBG:  Basename 09/20/10 1133 09/20/10 0750 09/20/10 0010 09/19/10 1953 09/19/10 1540 09/19/10 1140  GLUCAP 103* 136* 149* 140* 155* 143*   Hemoglobin A1C: No results found for this basename: HGBA1C in the last 72 hours Fasting Lipid Panel: No results found for this basename: CHOL,HDL,LDLCALC,TRIG,CHOLHDL,LDLDIRECT in the last 72 hours Thyroid Function Tests: No results found for this basename: TSH,T4TOTAL,FREET4,T3FREE,THYROIDAB in the last 72 hours Anemia Panel: No results found for this basename: VITAMINB12,FOLATE,FERRITIN,TIBC,IRON,RETICCTPCT in the last 72 hours  Urinalysis: Urine color yellow, specific gravity greater than 1.030, pH 5.5, bilirubin small, glucose negative, ketones trace, protein trace, 0-2 WBCs, 0-2 RBCs, many bacteria, rare squamous cells.  Misc. Labs:   Micro: Recent Results (from the past 240 hour(s))  CULTURE, BLOOD (ROUTINE X 2)     Status: Normal (Preliminary result)   Collection Time   09/16/10  1:30 PM      Component Value Range Status Comment   Specimen Description BLOOD LEFT ANTECUBITAL   Final  Special Requests     Final    Value: BOTTLES DRAWN AEROBIC AND ANAEROBIC 6CC EACH BOTTLE   Culture NO GROWTH 4 DAYS   Final    Report Status PENDING   Incomplete   CULTURE, BLOOD (ROUTINE X 2)     Status: Normal (Preliminary result)   Collection Time   09/16/10  1:47 PM      Component Value Range Status Comment   Specimen Description BLOOD LEFT HAND   Final    Special Requests     Final    Value: BOTTLES DRAWN AEROBIC AND ANAEROBIC 6CC EACH BOTTLE   Culture NO GROWTH 4 DAYS   Final    Report Status PENDING   Incomplete   URINE CULTURE     Status: Normal   Collection Time    09/16/10  7:18 PM      Component Value Range Status Comment   Specimen Description URINE, CATHETERIZED   Final    Special Requests NONE   Final    Setup Time [id number]   Final    Colony Count NO GROWTH   Final    Culture NO GROWTH   Final    Report Status 09/18/2010 FINAL   Final   MRSA PCR SCREENING     Status: Normal   Collection Time   09/16/10 10:45 PM      Component Value Range Status Comment   MRSA by PCR NEGATIVE  NEGATIVE  Final   PATHOLOGIST SMEAR REVIEW     Status: Normal   Collection Time   09/19/10 10:30 AM      Component Value Range Status Comment   Tech Review     Final    Value: THROMBOCYTOPENIA WITH NORMOCYTIC ANEMIA. NO EVIDENCE OF LEUKEMIA. 09/19/10    Studies/Results: Ct Abdomen Pelvis Wo Contrast  09/16/2010  *RADIOLOGY REPORT*  Clinical Data: Abdominal pain, vomiting  CT ABDOMEN AND PELVIS WITHOUT CONTRAST  Technique:  Multidetector CT imaging of the abdomen and pelvis was performed following the standard protocol without intravenous contrast.  Comparison: 07/14/2009  Findings: Lung bases are essentially clear.  Liver is notable for a punctate calcified granuloma.  Spleen, pancreas, and adrenal glands within normal limits.  Gallbladder is notable for layering sludge/stones, without associated inflammatory changes.  No intrahepatic or extrahepatic ductal dilatation.  Kidneys are grossly unremarkable.  No hydronephrosis.  Multiple dilated loops of small bowel, with transition in the right lower abdomen (series 4/image 35), compatible with high grade small bowel obstruction.  Decompressed colon with numerous colonic diverticula, without associated inflammatory changes.  Small volume perihepatic and pelvic ascites.  Uterus unremarkable.  No adnexal masses.  Bladder is decompressed by Foley catheter.  Degenerative changes of the visualized thoracolumbar spine.  IMPRESSION: High grade small bowel obstruction with transition in the right lower abdomen, as described above.   Original Report Authenticated By: Julian Hy, M.D.   Dg Chest 1 View  09/16/2010  *RADIOLOGY REPORT*  Clinical Data: 77 year old female with hypotension, tachycardia, vomiting.  CHEST - 1 VIEW  Comparison: Upright AP view at 1610 hours.  Findings: Chronic cardiomegaly.  Stable cardiac size and mediastinal contours.  Lordotic view.  Lung volumes appear stable. No pneumothorax, pulmonary edema, pleural effusion or confluent pulmonary opacity identified.  IMPRESSION: Stable cardiomegaly.  No acute cardiopulmonary abnormality.  Original Report Authenticated By: Randall An, M.D.    Medications: I have reviewed the patient's current medications.  Assessment/Plan: Nausea and vomiting continues. Still obstructed.  Strict NPO. Will give vitamin  K in case ex lap needed. Repeat abdominal films  Small bowel obstruction  Acute renal failure. Resolving History of stage III chronic kidney disease.   Hypotension. Resolved  Chronic atrial fibrillation.  Back on Cardizem drip, HR better. Will restart IV metoprolol  Coagulopathy, off of coumadin. See above  Now hyperkalemic:  D/c kcl  Thrombocytopenia.improved  Dilutional anemia. stable  Gout with exacerbation, better  Diet-controlled type 2 diabetes mellitus. Sliding scale NovoLog was started yesterday. Her hemoglobin A1c is 6.3.  Abnormal TFTs:  Not consistent with hyperthyroidism. Needs repeat TFTs when acute illness resolved  Elevated troponin I. This likely secondary to acute renal failure.      LOS: 4 days   Vergie Zahm L 09/20/2010, 12:42 PM

## 2010-09-20 NOTE — Progress Notes (Signed)
  Subjective: Some increase in nausea.  No emesis.  NO Flatus.  Patient states she never has had any flatus even when asked yesterday.  She is belching.  Some abd discomfort.   Objective: Vital signs in last 24 hours: Temp:  [97.4 F (36.3 C)-98.4 F (36.9 C)] 98.3 F (36.8 C) (08/15 0700) Pulse Rate:  [25-104] 104  (08/15 1145) Resp:  [20-40] 22  (08/15 1145) BP: (102-173)/(69-108) 156/91 mmHg (08/15 1100) SpO2:  [94 %-99 %] 94 % (08/15 1145) Weight:  [80.3 kg (177 lb 0.5 oz)] 177 lb 0.5 oz (80.3 kg) (08/15 0410) Last BM Date: 09/19/10 (as reported by night RN)  Intake/Output from previous day: 08/14 0701 - 08/15 0700 In: 1311.7 [I.V.:1311.7] Out: 700 [Urine:700] Intake/Output this shift: I/O this shift: In: 100 [I.V.:100] Out: -   General appearance: alert and no distress GI: Intermittant but distant BS, soft, mild-mod distention.   Mild tenderness.  No peritoneal signs.  Lab Results:  @LABLAST2 (wbc:2,hgb:2,hct:2,plt:2) BMET  [facility] 09/20/10 0507 09/19/10 0534  NA 139 143  K 5.3* 5.1  CL 109 109  CO2 17* 20  GLUCOSE 143* 141*  BUN 43* 45*  CREATININE 1.49* 1.93*  CALCIUM 8.5 8.2*   PT/INR  Basename 09/20/10 0507 09/19/10 0533  LABPROT 37.3* 50.6*47.0*  INR 3.71* 5.47*4.98*   ABG No results found for this basename: PHART:2,PCO2:2,PO2:2,HCO3:2 in the last 72 hours  Studies/Results: Dg Abd 1 View  09/18/2010  *RADIOLOGY REPORT*  Clinical Data: Small bowel obstruction  ABDOMEN - 1 VIEW  Comparison: CT abdomen and pelvis 09/16/2010  Findings: Paucity of colonic gas. Persistent dilatation of a small bowel loop in the mid abdomen, 4 mm diameter. Findings remain compatible with small bowel obstruction. Bones appear demineralized with degenerative disc and facet disease changes lumbar spine. Few scattered vascular calcifications. No definite urinary tract calcification.  IMPRESSION: Persistent small bowel dilatation consistent with small bowel obstruction. Paucity  of colonic gas.  Original Report Authenticated By: Burnetta Sabin, M.D.    Anti-infectives: Anti-infectives    None      Assessment/Plan: s/p  SBO.  Apparently patient miss understood my questioning yesterday when I asked her about flatus (gas from below).  I am less convienced at this point that she is going to progress without surgery.  However, given that her INR is still greatly elevated, I would recommend continuing with conservative measures for now.  ONly should she need emergent/urgent surgical intervention would I rec reversing her anti-coagulation medically.  Still would place NG if nausea increases or if she develops emesis.    LOS: 4 days    Seema Blum C 09/20/2010

## 2010-09-21 DIAGNOSIS — R946 Abnormal results of thyroid function studies: Secondary | ICD-10-CM | POA: Diagnosis present

## 2010-09-21 LAB — CULTURE, BLOOD (ROUTINE X 2)
Culture: NO GROWTH
Culture: NO GROWTH

## 2010-09-21 LAB — BASIC METABOLIC PANEL
BUN: 32 mg/dL — ABNORMAL HIGH (ref 6–23)
CO2: 21 mEq/L (ref 19–32)
Calcium: 8.7 mg/dL (ref 8.4–10.5)
Chloride: 103 mEq/L (ref 96–112)
Creatinine, Ser: 1.19 mg/dL — ABNORMAL HIGH (ref 0.50–1.10)
GFR calc Af Amer: 53 mL/min — ABNORMAL LOW (ref >60)
GFR calc non Af Amer: 44 mL/min — ABNORMAL LOW (ref >60)
Glucose, Bld: 125 mg/dL — ABNORMAL HIGH (ref 70–99)
Potassium: 4.2 mEq/L (ref 3.5–5.1)
Sodium: 137 mEq/L (ref 135–145)

## 2010-09-21 LAB — GLUCOSE, CAPILLARY
Glucose-Capillary: 120 mg/dL — ABNORMAL HIGH (ref 70–99)
Glucose-Capillary: 122 mg/dL — ABNORMAL HIGH (ref 70–99)
Glucose-Capillary: 131 mg/dL — ABNORMAL HIGH (ref 70–99)
Glucose-Capillary: 143 mg/dL — ABNORMAL HIGH (ref 70–99)
Glucose-Capillary: 77 mg/dL (ref 70–99)
Glucose-Capillary: 85 mg/dL (ref 70–99)

## 2010-09-21 LAB — CBC
HCT: 33.3 % — ABNORMAL LOW (ref 36.0–46.0)
Hemoglobin: 11 g/dL — ABNORMAL LOW (ref 12.0–15.0)
MCH: 28.2 pg (ref 26.0–34.0)
MCHC: 33 g/dL (ref 30.0–36.0)
MCV: 85.4 fL (ref 78.0–100.0)
Platelets: 149 10*3/uL — ABNORMAL LOW (ref 150–400)
RBC: 3.9 MIL/uL (ref 3.87–5.11)
RDW: 16 % — ABNORMAL HIGH (ref 11.5–15.5)
WBC: 9.4 10*3/uL (ref 4.0–10.5)

## 2010-09-21 LAB — PROTIME-INR
INR: 1.74 — ABNORMAL HIGH (ref 0.00–1.49)
Prothrombin Time: 20.7 seconds — ABNORMAL HIGH (ref 11.6–15.2)

## 2010-09-21 MED ORDER — SODIUM CHLORIDE 0.9 % IJ SOLN
INTRAMUSCULAR | Status: AC
  Filled 2010-09-21: qty 10

## 2010-09-21 MED ORDER — ENOXAPARIN SODIUM 40 MG/0.4ML ~~LOC~~ SOLN: 40.0000 mg | Freq: Once | SUBCUTANEOUS | Status: DC

## 2010-09-21 MED ORDER — SODIUM CHLORIDE 0.9 % IV SOLN
INTRAVENOUS | Status: DC
  Administered 2010-09-21 – 2010-09-22 (×3): via INTRAVENOUS
  Administered 2010-09-22: 50 mL via INTRAVENOUS
  Administered 2010-09-23 – 2010-09-25 (×4): via INTRAVENOUS

## 2010-09-21 MED ORDER — METHYLPREDNISOLONE SODIUM SUCC 40 MG IJ SOLR
40.0000 mg | Freq: Once | INTRAMUSCULAR | Status: AC
  Administered 2010-09-21: 40 mg via INTRAVENOUS
  Filled 2010-09-21: qty 1

## 2010-09-21 MED ORDER — ALVIMOPAN 12 MG PO CAPS
12.0000 mg | ORAL_CAPSULE | Freq: Once | ORAL | Status: AC
  Administered 2010-09-22: 12 mg via ORAL

## 2010-09-21 MED ORDER — SODIUM CHLORIDE 0.9 % IJ SOLN
INTRAMUSCULAR | Status: AC
  Administered 2010-09-21
  Filled 2010-09-21: qty 10

## 2010-09-21 MED ORDER — ENOXAPARIN SODIUM 40 MG/0.4ML ~~LOC~~ SOLN
40.0000 mg | Freq: Once | SUBCUTANEOUS | Status: AC
  Administered 2010-09-22: 40 mg via SUBCUTANEOUS
  Filled 2010-09-21: qty 0.4

## 2010-09-21 MED ORDER — DEXTROSE 5 % IV SOLN
1.0000 g | INTRAVENOUS | Status: AC
  Administered 2010-09-22: 1 g via INTRAVENOUS

## 2010-09-21 MED ORDER — VITAMIN K1 10 MG/ML IJ SOLN
5.0000 mg | Freq: Once | INTRAMUSCULAR | Status: AC
  Administered 2010-09-21: 5 mg via SUBCUTANEOUS
  Filled 2010-09-21: qty 1

## 2010-09-21 MED ORDER — SODIUM CHLORIDE 0.9 % IJ SOLN
INTRAMUSCULAR | Status: AC
  Administered 2010-09-21: 01:00:00
  Filled 2010-09-21: qty 10

## 2010-09-21 MED ORDER — DEXTROSE 5 % IV SOLN
1.0000 g | INTRAVENOUS | Status: DC
  Filled 2010-09-21: qty 1

## 2010-09-21 MED ORDER — ALVIMOPAN 12 MG PO CAPS: 12.0000 mg | ORAL_CAPSULE | Freq: Once | ORAL | Status: DC

## 2010-09-21 NOTE — Progress Notes (Signed)
Subjective: Vomited x3 within the past hour. No abdominal pain.  Still with right lateral foot pain from gout.  Objective: Vital signs in last 24 hours: Filed Vitals:   09/21/10 0745 09/21/10 0800 09/21/10 0815 09/21/10 0830  BP:  151/95    Pulse: 119  110 111  Temp:      TempSrc:      Resp: 24 24 23 27   Height:      Weight:      SpO2: 98% 94% 95% 100%    Intake/Output Summary (Last 24 hours) at 09/21/10 0923 Last data filed at 09/21/10 0800  Gross per 24 hour  Intake 1666.67 ml  Output   2075 ml  Net -408.33 ml   Tele: a fib rate around 125  Weight change: -2.9 kg (-6 lb 6.3 oz)  General appearance: Uncomfortable.  Resp: clear to auscultation bilaterally, without wheezes rhonchi or rales Cardio: irregularly irregular rhythm, with tachycardia. GI: soft, nondistended; hypoactive bowel sounds normal; no masses,  no organomegaly Extremities: Right 5th MTP erythematous and tender  Lab Results: Basic Metabolic Panel:  Basename 09/21/10 0443 09/20/10 0507 09/19/10 0534 09/19/10 0533  NA 137 139 -- --  K 4.2 5.3* -- --  CL 103 109 -- --  CO2 21 17* -- --  GLUCOSE 125* 143* -- --  BUN 32* 43* -- --  CREATININE 1.19* 1.49* -- --  CALCIUM 8.7 8.5 -- --  MG -- -- -- 1.6  PHOS -- -- 2.6 --   Liver Function Tests:  Basename 09/19/10 0534  AST --  ALT --  ALKPHOS --  BILITOT --  PROT --  ALBUMIN 2.7*   No results found for this basename: LIPASE:2,AMYLASE:2 in the last 72 hours CBC:  Basename 09/21/10 0443 09/20/10 0507  WBC 9.4 11.7*  NEUTROABS -- --  HGB 11.0* 10.6*  HCT 33.3* 32.6*  MCV 85.4 86.0  PLT 149* 144*   Cardiac Enzymes: No results found for this basename: CKTOTAL:3,CKMB:3,CKMBINDEX:3,TROPONINI:3 in the last 72 hours BNP: No results found for this basename: POCBNP:3 in the last 72 hours D-Dimer:  [facility] 09/19/10 0533  DDIMER 1.31*   CBG:  Basename 09/21/10 0719 09/21/10 0355 09/21/10 09/20/10 1954 09/20/10 1633 09/20/10 1133  GLUCAP  122* 131* 120* 116* 138* 103*   Hemoglobin A1C: No results found for this basename: HGBA1C in the last 72 hours Fasting Lipid Panel: No results found for this basename: CHOL,HDL,LDLCALC,TRIG,CHOLHDL,LDLDIRECT in the last 72 hours Thyroid Function Tests: No results found for this basename: TSH,T4TOTAL,FREET4,T3FREE,THYROIDAB in the last 72 hours Anemia Panel: No results found for this basename: VITAMINB12,FOLATE,FERRITIN,TIBC,IRON,RETICCTPCT in the last 72 hours  Micro: Recent Results (from the past 240 hour(s))  CULTURE, BLOOD (ROUTINE X 2)     Status: Normal   Collection Time   09/16/10  1:30 PM      Component Value Range Status Comment   Specimen Description BLOOD LEFT ANTECUBITAL   Final    Special Requests     Final    Value: BOTTLES DRAWN AEROBIC AND ANAEROBIC 6CC EACH BOTTLE   Culture NO GROWTH 5 DAYS   Final    Report Status 09/21/2010 FINAL   Final   CULTURE, BLOOD (ROUTINE X 2)     Status: Normal   Collection Time   09/16/10  1:47 PM      Component Value Range Status Comment   Specimen Description BLOOD LEFT HAND   Final    Special Requests     Final    Value:  BOTTLES DRAWN AEROBIC AND ANAEROBIC 6CC EACH BOTTLE   Culture NO GROWTH 5 DAYS   Final    Report Status 09/21/2010 FINAL   Final   URINE CULTURE     Status: Normal   Collection Time   09/16/10  7:18 PM      Component Value Range Status Comment   Specimen Description URINE, CATHETERIZED   Final    Special Requests NONE   Final    Setup Time [id number]   Final    Colony Count NO GROWTH   Final    Culture NO GROWTH   Final    Report Status 09/18/2010 FINAL   Final   MRSA PCR SCREENING     Status: Normal   Collection Time   09/16/10 10:45 PM      Component Value Range Status Comment   MRSA by PCR NEGATIVE  NEGATIVE  Final   PATHOLOGIST SMEAR REVIEW     Status: Normal   Collection Time   09/19/10 10:30 AM      Component Value Range Status Comment   Tech Review     Final    Value: THROMBOCYTOPENIA WITH  NORMOCYTIC ANEMIA. NO EVIDENCE OF LEUKEMIA. 09/19/10    Dg Abd 2 Views  09/20/2010  *RADIOLOGY REPORT*  Clinical Data: Small bowel obstruction  ABDOMEN - 2 VIEW  Comparison: 09/18/2010  Findings: Progressive dilatation of air-filled small bowel loops compatible with high-grade small bowel obstruction. No colonic gas identified. No definite bowel wall thickening or free intraperitoneal air. Levoconvex thoracolumbar scoliosis with multilevel degenerative disc and facet disease changes lumbar spine. Osseous demineralization. Vascular calcifications and anastomotic staple line in pelvis.  IMPRESSION: Progressive small bowel dilatation compatible with high-grade small bowel obstruction.  Original Report Authenticated By: Burnetta Sabin, M.D.    Medications: I have reviewed the patient's current medications.  Assessment/Plan: Principal Problem:  *SBO (small bowel obstruction):  Discussed with Dr. Geroge Baseman. Patient will have her NG tube replaced. I will give another dose of vitamin K. She will be scheduled for surgery tomorrow. Active Problems:  Chronic a-fib: Continue IV metoprolol and diltiazem.  DM type 2 (diabetes mellitus, type 2): Continue current  Gout attack: I will give another dose of Solu-Medrol today. Her in by mouth status and renal problems preclude the use of colchicine and nonsteroidal anti-inflammatories.  ARF (acute renal failure): Resolving.  CKD (chronic kidney disease)  Anemia: Stable  Borderline abnormal TFTs: Repeat outpatient thyroid function tests once a acute illness has resolved.  Warfarin anticoagulation: Reversed. Hyperkalemia: Resolved. Resolved metabolic acidosis.    LOS: 5 days   Brittanni Cariker L 09/21/2010, 9:23 AM

## 2010-09-21 NOTE — Progress Notes (Signed)
  Subjective: No change.  Nausea and emesis worse but better now that NG placed.  No flatus.  Objective: Vital signs in last 24 hours: Temp:  [97.5 F (36.4 C)-98.8 F (37.1 C)] 97.5 F (36.4 C) (08/16 1615) Pulse Rate:  [25-127] 109  (08/16 1603) Resp:  [17-40] 18  (08/16 1603) BP: (124-160)/(54-95) 139/74 mmHg (08/16 1600) SpO2:  [85 %-100 %] 93 % (08/16 1603) Weight:  [77.4 kg (170 lb 10.2 oz)] 170 lb 10.2 oz (77.4 kg) (08/16 0500) Last BM Date: 09/19/10  Intake/Output from previous day: 10-07-22 0701 - 08/16 0700 In: 1686.7 [I.V.:1666.7; IV Piggyback:20] Out: [id number]; Emesis/NG output:300] Intake/Output this shift: I/O this shift: In: 518.7 [I.V.:511.7; IV Piggyback:7] Out: 850 [Urine:650; Emesis/NG output:200]  General appearance: alert and no distress GI: soft distented.  Lab Results:  @LABLAST2 (wbc:2,hgb:2,hct:2,plt:2) BMET  Basename 09/21/10 0443 2010/10/07 0507  NA 137 139  K 4.2 5.3*  CL 103 109  CO2 21 17*  GLUCOSE 125* 143*  BUN 32* 43*  CREATININE 1.19* 1.49*  CALCIUM 8.7 8.5   PT/INR  Basename 09/21/10 0443 2010-10-07 0507  LABPROT 20.7* 37.3*  INR 1.74* 3.71*   ABG No results found for this basename: PHART:2,PCO2:2,PO2:2,HCO3:2 in the last 72 hours  Studies/Results: Dg Abd 2 Views  2010-10-07  *RADIOLOGY REPORT*  Clinical Data: Small bowel obstruction  ABDOMEN - 2 VIEW  Comparison: 09/18/2010  Findings: Progressive dilatation of air-filled small bowel loops compatible with high-grade small bowel obstruction. No colonic gas identified. No definite bowel wall thickening or free intraperitoneal air. Levoconvex thoracolumbar scoliosis with multilevel degenerative disc and facet disease changes lumbar spine. Osseous demineralization. Vascular calcifications and anastomotic staple line in pelvis.  IMPRESSION: Progressive small bowel dilatation compatible with high-grade small bowel obstruction.  Original Report Authenticated By: Burnetta Sabin, M.D.     Anti-infectives: Anti-infectives    None      Assessment/Plan: s/p Procedure(s): EXPLORATORY LAPAROTOMY COLON RESECTION SBO.  INR nearly normal.  Will proceed to OR tomorrow for ex lap  LOS: 5 days    Daney Moor C 09/21/2010

## 2010-09-21 NOTE — Progress Notes (Signed)
Pt lives alone family supportive. Pt may need hh or snf at d/c

## 2010-09-22 ENCOUNTER — Encounter (HOSPITAL_COMMUNITY): Payer: Self-pay | Admitting: Anesthesiology

## 2010-09-22 ENCOUNTER — Inpatient Hospital Stay (HOSPITAL_COMMUNITY): Payer: Medicare Other | Admitting: Anesthesiology

## 2010-09-22 ENCOUNTER — Encounter (HOSPITAL_COMMUNITY)
Admission: EM | Disposition: A | Discharge status: Home Health | Payer: Self-pay | Source: Home / Self Care | Attending: Internal Medicine

## 2010-09-22 ENCOUNTER — Encounter (HOSPITAL_COMMUNITY): Payer: Self-pay | Admitting: *Deleted

## 2010-09-22 HISTORY — PX: LAPAROTOMY: SHX154

## 2010-09-22 LAB — GLUCOSE, CAPILLARY
Glucose-Capillary: 101 mg/dL — ABNORMAL HIGH (ref 70–99)
Glucose-Capillary: 102 mg/dL — ABNORMAL HIGH (ref 70–99)
Glucose-Capillary: 86 mg/dL (ref 70–99)
Glucose-Capillary: 87 mg/dL (ref 70–99)
Glucose-Capillary: 87 mg/dL (ref 70–99)
Glucose-Capillary: 96 mg/dL (ref 70–99)
Glucose-Capillary: 99 mg/dL (ref 70–99)

## 2010-09-22 LAB — DIFFERENTIAL
Basophils Absolute: 0 10*3/uL (ref 0.0–0.1)
Basophils Relative: 0 % (ref 0–1)
Eosinophils Absolute: 0.1 10*3/uL (ref 0.0–0.7)
Eosinophils Relative: 1 % (ref 0–5)
Lymphocytes Relative: 13 % (ref 12–46)
Lymphs Abs: 1.2 10*3/uL (ref 0.7–4.0)
Monocytes Absolute: 1.5 10*3/uL — ABNORMAL HIGH (ref 0.1–1.0)
Monocytes Relative: 16 % — ABNORMAL HIGH (ref 3–12)
Neutro Abs: 6.6 10*3/uL (ref 1.7–7.7)
Neutrophils Relative %: 70 % (ref 43–77)

## 2010-09-22 LAB — COMPREHENSIVE METABOLIC PANEL
ALT: 14 U/L (ref 0–35)
AST: 15 U/L (ref 0–37)
Albumin: 2.6 g/dL — ABNORMAL LOW (ref 3.5–5.2)
Alkaline Phosphatase: 68 U/L (ref 39–117)
BUN: 22 mg/dL (ref 6–23)
CO2: 23 mEq/L (ref 19–32)
Calcium: 7.9 mg/dL — ABNORMAL LOW (ref 8.4–10.5)
Chloride: 101 mEq/L (ref 96–112)
Creatinine, Ser: 1.13 mg/dL — ABNORMAL HIGH (ref 0.50–1.10)
GFR calc Af Amer: 56 mL/min — ABNORMAL LOW (ref >60)
GFR calc non Af Amer: 47 mL/min — ABNORMAL LOW (ref >60)
Glucose, Bld: 88 mg/dL (ref 70–99)
Potassium: 3.9 mEq/L (ref 3.5–5.1)
Sodium: 138 mEq/L (ref 135–145)
Total Bilirubin: 2.2 mg/dL — ABNORMAL HIGH (ref 0.3–1.2)
Total Protein: 5.8 g/dL — ABNORMAL LOW (ref 6.0–8.3)

## 2010-09-22 LAB — CBC
HCT: 31.5 % — ABNORMAL LOW (ref 36.0–46.0)
Hemoglobin: 10.5 g/dL — ABNORMAL LOW (ref 12.0–15.0)
MCH: 28.7 pg (ref 26.0–34.0)
MCHC: 33.3 g/dL (ref 30.0–36.0)
MCV: 86.1 fL (ref 78.0–100.0)
Platelets: 159 10*3/uL (ref 150–400)
RBC: 3.66 MIL/uL — ABNORMAL LOW (ref 3.87–5.11)
RDW: 15.6 % — ABNORMAL HIGH (ref 11.5–15.5)
WBC: 9.4 10*3/uL (ref 4.0–10.5)

## 2010-09-22 LAB — APTT: aPTT: 43 seconds — ABNORMAL HIGH (ref 24–37)

## 2010-09-22 LAB — PROTIME-INR
INR: 1.27 (ref 0.00–1.49)
Prothrombin Time: 16.2 seconds — ABNORMAL HIGH (ref 11.6–15.2)

## 2010-09-22 SURGERY — LAPAROTOMY, EXPLORATORY
Anesthesia: General | Site: Abdomen | Wound class: Clean

## 2010-09-22 MED ORDER — PHENYLEPHRINE HCL 10 MG/ML IJ SOLN
INTRAMUSCULAR | Status: DC | PRN
  Administered 2010-09-22 (×3): 100 ug via INTRAVENOUS

## 2010-09-22 MED ORDER — LACTATED RINGERS IV SOLN
INTRAVENOUS | Status: DC | PRN
  Administered 2010-09-22: 13:00:00 via INTRAVENOUS

## 2010-09-22 MED ORDER — CEFAZOLIN SODIUM 1 G IJ SOLR
INTRAMUSCULAR | Status: AC
  Administered 2010-09-22: 1000 mg via INTRAVENOUS
  Filled 2010-09-22: qty 10

## 2010-09-22 MED ORDER — ROCURONIUM BROMIDE 50 MG/5ML IV SOLN
INTRAVENOUS | Status: AC
  Filled 2010-09-22: qty 1

## 2010-09-22 MED ORDER — SUCCINYLCHOLINE CHLORIDE 20 MG/ML IJ SOLN
INTRAMUSCULAR | Status: DC | PRN
  Administered 2010-09-22: 120 mg via INTRAVENOUS

## 2010-09-22 MED ORDER — PROPOFOL 10 MG/ML IV EMUL
INTRAVENOUS | Status: AC
  Filled 2010-09-22: qty 20

## 2010-09-22 MED ORDER — ACETAMINOPHEN 325 MG PO TABS: 325.0000 mg | ORAL_TABLET | ORAL | Status: DC | PRN

## 2010-09-22 MED ORDER — PHENYLEPHRINE HCL 10 MG/ML IJ SOLN
INTRAMUSCULAR | Status: AC
  Filled 2010-09-22: qty 1

## 2010-09-22 MED ORDER — ONDANSETRON HCL 4 MG/2ML IJ SOLN
4.0000 mg | Freq: Once | INTRAMUSCULAR | Status: AC
  Administered 2010-09-22: 4 mg via INTRAVENOUS

## 2010-09-22 MED ORDER — MIDAZOLAM HCL 2 MG/2ML IJ SOLN
1.0000 mg | INTRAMUSCULAR | Status: DC | PRN
  Administered 2010-09-22: 2 mg via INTRAVENOUS

## 2010-09-22 MED ORDER — LIDOCAINE HCL (PF) 1 % IJ SOLN
INTRAMUSCULAR | Status: AC
  Filled 2010-09-22: qty 5

## 2010-09-22 MED ORDER — FENTANYL CITRATE 0.05 MG/ML IJ SOLN
INTRAMUSCULAR | Status: DC | PRN
  Administered 2010-09-22 (×3): 50 ug via INTRAVENOUS

## 2010-09-22 MED ORDER — FENTANYL CITRATE 0.05 MG/ML IJ SOLN: 25.0000 ug | INTRAMUSCULAR | Status: DC | PRN

## 2010-09-22 MED ORDER — PROPOFOL 10 MG/ML IV EMUL
INTRAVENOUS | Status: DC | PRN
  Administered 2010-09-22: 120 mg via INTRAVENOUS

## 2010-09-22 MED ORDER — LIDOCAINE HCL 1 % IJ SOLN
INTRAMUSCULAR | Status: DC | PRN
  Administered 2010-09-22: 40 mg via INTRADERMAL

## 2010-09-22 MED ORDER — CEFAZOLIN SODIUM 1-5 GM-% IV SOLN
1.0000 g | Freq: Once | INTRAVENOUS | Status: AC
  Administered 2010-09-22: 1 g via INTRAVENOUS

## 2010-09-22 MED ORDER — FENTANYL CITRATE 0.05 MG/ML IJ SOLN
INTRAMUSCULAR | Status: AC
  Filled 2010-09-22: qty 5

## 2010-09-22 MED ORDER — SODIUM CHLORIDE 0.9 % IR SOLN
Status: DC | PRN
  Administered 2010-09-22: 1000 mL

## 2010-09-22 MED ORDER — ONDANSETRON HCL 4 MG/2ML IJ SOLN: 4.0000 mg | Freq: Once | INTRAMUSCULAR | Status: AC | PRN

## 2010-09-22 MED ORDER — ONDANSETRON HCL 4 MG/2ML IJ SOLN
INTRAMUSCULAR | Status: AC
  Administered 2010-09-22: 4 mg via INTRAVENOUS
  Filled 2010-09-22: qty 2

## 2010-09-22 MED ORDER — GLYCOPYRROLATE 0.2 MG/ML IJ SOLN
INTRAMUSCULAR | Status: AC
  Filled 2010-09-22: qty 2

## 2010-09-22 MED ORDER — ROCURONIUM BROMIDE 100 MG/10ML IV SOLN
INTRAVENOUS | Status: DC | PRN
  Administered 2010-09-22: 5 mg via INTRAVENOUS
  Administered 2010-09-22: 25 mg via INTRAVENOUS

## 2010-09-22 MED ORDER — MIDAZOLAM HCL 2 MG/2ML IJ SOLN
INTRAMUSCULAR | Status: AC
  Administered 2010-09-22: 2 mg via INTRAVENOUS
  Filled 2010-09-22: qty 2

## 2010-09-22 MED ORDER — SODIUM CHLORIDE 0.9 % IJ SOLN
INTRAMUSCULAR | Status: AC
  Administered 2010-09-23: 10 mL
  Filled 2010-09-22: qty 10

## 2010-09-22 MED ORDER — SUCCINYLCHOLINE CHLORIDE 20 MG/ML IJ SOLN
INTRAMUSCULAR | Status: AC
  Filled 2010-09-22: qty 1

## 2010-09-22 MED ORDER — SODIUM CHLORIDE 0.9 % IV SOLN
INTRAVENOUS | Status: AC
  Administered 2010-09-22: 50 mL via INTRAVENOUS
  Filled 2010-09-22: qty 50

## 2010-09-22 MED ORDER — ALVIMOPAN 12 MG PO CAPS
ORAL_CAPSULE | ORAL | Status: AC
  Administered 2010-09-22: 12 mg via ORAL
  Filled 2010-09-22: qty 1

## 2010-09-22 MED ORDER — LACTATED RINGERS IV SOLN
INTRAVENOUS | Status: DC
  Administered 2010-09-22: 12:00:00 via INTRAVENOUS
  Administered 2010-09-22: 500 mL via INTRAVENOUS

## 2010-09-22 MED ORDER — NEOSTIGMINE METHYLSULFATE 1 MG/ML IJ SOLN
INTRAMUSCULAR | Status: AC
  Filled 2010-09-22: qty 10

## 2010-09-22 SURGICAL SUPPLY — 67 items
APPLIER CLIP 11 MED OPEN (CLIP)
APPLIER CLIP 13 LRG OPEN (CLIP)
APR CLP LRG 13 20 CLIP (CLIP)
APR CLP MED 11 20 MLT OPN (CLIP)
BAG HAMPER (MISCELLANEOUS) ×2 IMPLANT
BARRIER SKIN 2 3/4 (OSTOMY) IMPLANT
BARRIER SKIN OD2.25 2 3/4 FLNG (OSTOMY) IMPLANT
BRR ADH 6X5 SEPRAFILM 1 SHT (MISCELLANEOUS) ×1
BRR SKN FLT 2.75X2.25 2 PC (OSTOMY)
CLAMP POUCH DRAINAGE QUIET (OSTOMY) IMPLANT
CLIP APPLIE 11 MED OPEN (CLIP) IMPLANT
CLIP APPLIE 13 LRG OPEN (CLIP) IMPLANT
CLOTH BEACON ORANGE TIMEOUT ST (SAFETY) ×2 IMPLANT
COVER LIGHT HANDLE STERIS (MISCELLANEOUS) ×4 IMPLANT
DRAPE WARM FLUID 44X44 (DRAPE) ×2 IMPLANT
DURAPREP 26ML APPLICATOR (WOUND CARE) ×2 IMPLANT
ELECT BLADE 6 FLAT ULTRCLN (ELECTRODE) ×2 IMPLANT
ELECT REM PT RETURN 9FT ADLT (ELECTROSURGICAL) ×2
ELECTRODE REM PT RTRN 9FT ADLT (ELECTROSURGICAL) ×1 IMPLANT
GAUZE SPONGE 4X4 12PLY STRL LF (GAUZE/BANDAGES/DRESSINGS) ×1 IMPLANT
GLOVE BIOGEL PI IND STRL 7.0 (GLOVE) IMPLANT
GLOVE BIOGEL PI IND STRL 7.5 (GLOVE) ×1 IMPLANT
GLOVE BIOGEL PI INDICATOR 7.0 (GLOVE) ×1
GLOVE BIOGEL PI INDICATOR 7.5 (GLOVE) ×1
GLOVE ECLIPSE 6.5 STRL STRAW (GLOVE) ×1 IMPLANT
GLOVE ECLIPSE 7.0 STRL STRAW (GLOVE) ×2 IMPLANT
GOWN BRE IMP SLV AUR XL STRL (GOWN DISPOSABLE) ×6 IMPLANT
HARMONIC SHEARS 14CM COAG (MISCELLANEOUS) IMPLANT
INST SET MAJOR GENERAL (KITS) ×2 IMPLANT
KIT ROOM TURNOVER APOR (KITS) ×2 IMPLANT
LIGASURE IMPACT 36 18CM CVD LR (INSTRUMENTS) IMPLANT
MANIFOLD NEPTUNE II (INSTRUMENTS) ×2 IMPLANT
NS IRRIG 1000ML POUR BTL (IV SOLUTION) ×2 IMPLANT
PACK ABDOMINAL MAJOR (CUSTOM PROCEDURE TRAY) ×2 IMPLANT
PAD ARMBOARD 7.5X6 YLW CONV (MISCELLANEOUS) ×2 IMPLANT
POUCH OSTOMY 2 3/4  H 3804 (WOUND CARE)
POUCH OSTOMY 2 3/4 H 3804 (WOUND CARE)
POUCH OSTOMY 2 PC DRNBL 2.75 (WOUND CARE) IMPLANT
RELOAD LINEAR CUT PROX 55 BLUE (ENDOMECHANICALS) IMPLANT
RELOAD PROXIMATE 75MM BLUE (ENDOMECHANICALS) IMPLANT
RELOAD STAPLE 55 3.8 BLU REG (ENDOMECHANICALS) IMPLANT
RELOAD STAPLE 75 3.8 BLU REG (ENDOMECHANICALS) IMPLANT
RETRACTOR WND ALEXIS 25 LRG (MISCELLANEOUS) IMPLANT
RETRACTOR WOUND ALXS 34CM XLRG (MISCELLANEOUS) IMPLANT
RTRCTR WOUND ALEXIS 25CM LRG (MISCELLANEOUS)
RTRCTR WOUND ALEXIS 34CM XLRG (MISCELLANEOUS) ×2
SEPRAFILM MEMBRANE 5X6 (MISCELLANEOUS) ×1 IMPLANT
SET BASIN LINEN APH (SET/KITS/TRAYS/PACK) ×2 IMPLANT
SHEARS HARMONIC 23CM COAG (MISCELLANEOUS) IMPLANT
SPONGE GAUZE 4X4 12PLY (GAUZE/BANDAGES/DRESSINGS) ×2 IMPLANT
STAPLER AUT SUT LDS 15W (STAPLE) IMPLANT
STAPLER GUN LINEAR PROX 60 (STAPLE) IMPLANT
STAPLER PROXIMATE 55 BLUE (STAPLE) IMPLANT
STAPLER PROXIMATE 75MM BLUE (STAPLE) IMPLANT
STAPLER VISISTAT (STAPLE) ×2 IMPLANT
SUCTION POOLE TIP (SUCTIONS) ×2 IMPLANT
SUT CHROMIC 0 SH (SUTURE) IMPLANT
SUT CHROMIC 2 0 SH (SUTURE) IMPLANT
SUT NOVA NAB GS-26 0 60 (SUTURE) ×4 IMPLANT
SUT SILK 2 0 (SUTURE)
SUT SILK 2-0 18XBRD TIE 12 (SUTURE) ×1 IMPLANT
SUT SILK 3 0 SH CR/8 (SUTURE) ×1 IMPLANT
SYR BULB IRRIGATION 50ML (SYRINGE) ×2 IMPLANT
TAPE CLOTH SURG 4X10 WHT LF (GAUZE/BANDAGES/DRESSINGS) ×1 IMPLANT
TOWEL BLUE STERILE X RAY DET (MISCELLANEOUS) ×2 IMPLANT
TOWEL OR 17X26 4PK STRL BLUE (TOWEL DISPOSABLE) ×2 IMPLANT
TRAY FOLEY CATH 14FR (SET/KITS/TRAYS/PACK) ×1 IMPLANT

## 2010-09-22 NOTE — Progress Notes (Signed)
Subjective: Better after NG tube placed. Abdomen is less bloated. No nausea currently. Still complaining of pain.  Objective: Vital signs in last 24 hours: Filed Vitals:   09/22/10 0745 09/22/10 0800 09/22/10 0815 09/22/10 0830  BP:  143/81    Pulse: 116 102 101 106  Temp:  97.8 F (36.6 C)    TempSrc:  Oral    Resp: 20 22 22 17   Height:      Weight:      SpO2: 95% 89% 89% 93%    Intake/Output Summary (Last 24 hours) at 09/22/10 0916 Last data filed at 09/22/10 0800  Gross per 24 hour  Intake 1905.67 ml  Output   2600 ml  Net -694.33 ml   Tele: a fib rate around 125  Weight change: -1 kg (-2 lb 3.3 oz)  General appearance: More comfortable . Resp: clear to auscultation bilaterally, without wheezes rhonchi or rales Cardio: irregularly irregular rhythm, with tachycardia. GI: soft, nondistended; hypoactive bowel sounds normal; no masses,  no organomegaly Extremities: Right 5th MTP erythematous and tender  Lab Results: Basic Metabolic Panel:  Basename 09/22/10 0431 09/21/10 0443  NA 138 137  K 3.9 4.2  CL 101 103  CO2 23 21  GLUCOSE 88 125*  BUN 22 32*  CREATININE 1.13* 1.19*  CALCIUM 7.9* 8.7  MG -- --  PHOS -- --   Liver Function Tests:  Basename 09/22/10 0431  AST 15  ALT 14  ALKPHOS 68  BILITOT 2.2*  PROT 5.8*  ALBUMIN 2.6*   No results found for this basename: LIPASE:2,AMYLASE:2 in the last 72 hours CBC:  Basename 09/22/10 0431 09/21/10 0443  WBC 9.4 9.4  NEUTROABS 6.6 --  HGB 10.5* 11.0*  HCT 31.5* 33.3*  MCV 86.1 85.4  PLT 159 149*   Cardiac Enzymes: No results found for this basename: CKTOTAL:3,CKMB:3,CKMBINDEX:3,TROPONINI:3 in the last 72 hours BNP: No results found for this basename: POCBNP:3 in the last 72 hours D-Dimer: No results found for this basename: DDIMER:2 in the last 72 hours CBG:  Basename 09/22/10 0734 09/22/10 0401 09/22/10 0002 09/21/10 1955 09/21/10 1603 09/21/10 1105  GLUCAP 86 87 99 77 85 143*   Hemoglobin  A1C: No results found for this basename: HGBA1C in the last 72 hours Fasting Lipid Panel: No results found for this basename: CHOL,HDL,LDLCALC,TRIG,CHOLHDL,LDLDIRECT in the last 72 hours Thyroid Function Tests: No results found for this basename: TSH,T4TOTAL,FREET4,T3FREE,THYROIDAB in the last 72 hours Anemia Panel: No results found for this basename: VITAMINB12,FOLATE,FERRITIN,TIBC,IRON,RETICCTPCT in the last 72 hours  Micro: Recent Results (from the past 240 hour(s))  CULTURE, BLOOD (ROUTINE X 2)     Status: Normal   Collection Time   09/16/10  1:30 PM      Component Value Range Status Comment   Specimen Description BLOOD LEFT ANTECUBITAL   Final    Special Requests     Final    Value: BOTTLES DRAWN AEROBIC AND ANAEROBIC 6CC EACH BOTTLE   Culture NO GROWTH 5 DAYS   Final    Report Status 09/21/2010 FINAL   Final   CULTURE, BLOOD (ROUTINE X 2)     Status: Normal   Collection Time   09/16/10  1:47 PM      Component Value Range Status Comment   Specimen Description BLOOD LEFT HAND   Final    Special Requests     Final    Value: BOTTLES DRAWN AEROBIC AND ANAEROBIC 6CC EACH BOTTLE   Culture NO GROWTH 5 DAYS   Final  Report Status 09/21/2010 FINAL   Final   URINE CULTURE     Status: Normal   Collection Time   09/16/10  7:18 PM      Component Value Range Status Comment   Specimen Description URINE, CATHETERIZED   Final    Special Requests NONE   Final    Setup Time [id number]   Final    Colony Count NO GROWTH   Final    Culture NO GROWTH   Final    Report Status 09/18/2010 FINAL   Final   MRSA PCR SCREENING     Status: Normal   Collection Time   09/16/10 10:45 PM      Component Value Range Status Comment   MRSA by PCR NEGATIVE  NEGATIVE  Final   PATHOLOGIST SMEAR REVIEW     Status: Normal   Collection Time   09/19/10 10:30 AM      Component Value Range Status Comment   Tech Review     Final    Value: THROMBOCYTOPENIA WITH NORMOCYTIC ANEMIA. NO EVIDENCE OF LEUKEMIA. 09/19/10     Dg Abd 2 Views  09/20/2010  *RADIOLOGY REPORT*  Clinical Data: Small bowel obstruction  ABDOMEN - 2 VIEW  Comparison: 09/18/2010  Findings: Progressive dilatation of air-filled small bowel loops compatible with high-grade small bowel obstruction. No colonic gas identified. No definite bowel wall thickening or free intraperitoneal air. Levoconvex thoracolumbar scoliosis with multilevel degenerative disc and facet disease changes lumbar spine. Osseous demineralization. Vascular calcifications and anastomotic staple line in pelvis.  IMPRESSION: Progressive small bowel dilatation compatible with high-grade small bowel obstruction.  Original Report Authenticated By: Burnetta Sabin, M.D.    Medications: I have reviewed the patient's current medications.  Assessment/Plan: Principal Problem:  *SBO (small bowel obstruction): Patient will go to the operating room momentarily  Active Problems:  Chronic a-fib: Continue IV metoprolol and diltiazem while n.p.o. for rate control.  DM type 2 (diabetes mellitus, type 2): Continue current  Gout attack: ARF (acute renal failure): Resolved.  CKD (chronic kidney disease)  Anemia: Stable  Borderline abnormal TFTs: Repeat outpatient thyroid function tests once a acute illness has resolved.  Warfarin anticoagulation: Reversed.    LOS: 6 days   Blaike Newburn L 09/22/2010, 9:16 AM

## 2010-09-22 NOTE — Anesthesia Procedure Notes (Signed)
Procedure Name: Intubation Date/Time: 09/22/2010 1:07 PM Performed by: Tressie Stalker Pre-anesthesia Checklist: Patient identified, Patient being monitored, Timeout performed, Emergency Drugs available and Suction available Patient Re-evaluated:Patient Re-evaluated prior to inductionOxygen Delivery Method: Circle System Utilized Preoxygenation: Pre-oxygenation with 100% oxygen Intubation Type: IV induction, Rapid sequence and Circoid Pressure applied Ventilation: Mask ventilation without difficulty Laryngoscope Size: Mac and 3 Grade View: Grade I Tube type: Oral Tube size: 7.0 mm Number of attempts: 1 Airway Equipment and Method: stylet Placement Confirmation: ETT inserted through vocal cords under direct vision,  positive ETCO2 and breath sounds checked- equal and bilateral Secured at: 21 cm Tube secured with: Tape Dental Injury: Teeth and Oropharynx as per pre-operative assessment

## 2010-09-22 NOTE — Progress Notes (Signed)
  Pt seen and evaluated in pre-op holding.  No acute change from previous evaluation.  Will proceed to OR for ex lap for SBO.

## 2010-09-22 NOTE — Interval H&P Note (Signed)
Pt seen and evaluated in pre-op.  No change from previous hospital course.  Will proceed to OR as scheduled and consented.

## 2010-09-22 NOTE — Transfer of Care (Signed)
Immediate Anesthesia Transfer of Care Note  Patient: [name] [name]  Procedure(s) Performed:  EXPLORATORY LAPAROTOMY - Lysis of Adhesions  Patient Location: PACU  Anesthesia Type: General  Level of Consciousness: awake, alert  and oriented  Airway & Oxygen Therapy: Patient Spontanous Breathing and Patient connected to face mask oxygen  Post-op Assessment: Report given to PACU RN  Post vital signs: stable  Complications: No apparent anesthesia complications

## 2010-09-22 NOTE — Preoperative (Signed)
Beta Blockers   Reason not to administer Beta Blockers:Hold beta blocker due to other (diltiazem infusion)

## 2010-09-22 NOTE — Anesthesia Preprocedure Evaluation (Signed)
Anesthesia Evaluation  Name, MR# and DOB Patient awake  General Assessment Comment  Reviewed: Allergy & Precautions, H&P , NPO status , Patient's Chart, lab work & pertinent test results, reviewed documented beta blocker date and time   History of Anesthesia Complications Negative for: history of anesthetic complications  Airway Mallampati: I TM Distance: >3 FB Neck ROM: Full    Dental  (+) Edentulous Upper   Pulmonary    pulmonary exam normalPulmonary Exam Normal     Cardiovascular hypertension, Pt. on medications and Pt. on home beta blockers +CHF + dysrhythmias Atrial Fibrillation Irregular Tachycardia    Neuro/Psych Negative Neurological ROS  Negative Psych ROS  GI/Hepatic/Renal (+)  GERD Medicated and Controlled   (+) ARF  Endo/Other  (+) Diabetes mellitus-, Type 2, Insulin Dependent,     Abdominal (+)  Abdomen: soft.    Musculoskeletal  (+) Arthritis - (gout), Osteoarthritis,    Hematology  (+) Blood dyscrasia, anemia, ,   Peds  Reproductive/Obstetrics negative OB ROS    Anesthesia Other Findings             Anesthesia Physical Anesthesia Plan  ASA: III  Anesthesia Plan: General   Post-op Pain Management:    Induction: Intravenous, Rapid sequence and Cricoid pressure planned  Airway Management Planned: Oral ETT  Additional Equipment:   Intra-op Plan:   Post-operative Plan: Extubation in OR  Informed Consent: I have reviewed the patients History and Physical, chart, labs and discussed the procedure including the risks, benefits and alternatives for the proposed anesthesia with the patient or authorized representative who has indicated his/her understanding and acceptance.     Plan Discussed with: CRNA  Anesthesia Plan Comments:         Anesthesia Quick Evaluation

## 2010-09-22 NOTE — Op Note (Signed)
Patient:  [name] [name]  DOB:  14-Mar-1930  MRN:  [id number]   Preop Diagnosis:  Small bowel obstruction  Postop Diagnosis:  The same  Procedure:  Support for laparotomy, lysis of adhesions  Surgeon:  Dr. Chelsea Primus  Anes:  Gen. endotracheal  Indications:  Patient is a 77 year old female presented to [facility] with symptoms consistent with a bowel obstruction. She had been on Coumadin to conservative management was undertaken. Unfortunately her bowels which did not resolve and upon normalization of her INR was advised that she proceed to the OR. Risks benefits and alternatives were discussed at length patient. Patient and family's questions and concerns were addressed. Patient was consented for a exploratory laparotomy possible bowel resection possible ostomy.  Procedure note:  Patient was taken to the OR was placed in the supine position on the OR table. General anesthetic was administered and was patient was asleep she was endotracheally needed by anesthesia. At this point a Foley catheter was placed in standard sterile fashion by the operative staff. Abdomen is prepped with DuraPrep solution and draped in standard fashion. A previous midline incisional scar was used for her current incision. A 10 blade scalpel was utilized to create the incision an additional dissection down to the subcuticular tissue was carried out using electrocautery. This included the division of the anterior fascia. At this point her clamps were utilized a late the fascia. The hemostat was utilized to grasp the peritoneum. Some ascitic fluid was encountered. This was quickly aspirated. It was not noted to be turbid or have any unusual odor. At this point a protractor retractor was utilized to maintain exposure. There were some omental adhesions anteriorly towards the upper abdomen. These were taken down with a combination of sharp and electrocautery dissection. At this point I followed the dilated loops of  small intestine. Continuing distally I did encounter a single band adhesion. It appeared the patient had a internal hernia causing the obstruction. The bowel is reduced and a band adhesion was divided. The bowel appeared quite viable. I did follow the bowel distally to the terminal ileum. All bowel appeared viable. It then ran the bowel personally to the ligament of Treitz and did not notice any other abnormalities or decisions. The large intestine was palpated and inspected without any abnormalities. The rectum was normal. The uterus is normal. The liver was palpated and felt to be normal. The stomach was also palpated. A nasogastric tube is exchanged for a larger 16 French nasogastric tube. It was noted to be in excellent position. At this point I did milk the small intestine contents towards the stomach and obtained approximately 1 L of gastric and enteric secretions. At this point her my attention to closure.  The abdomen was irrigated with copious amount of sterile saline. A piece of Seprafilm was placed over the replaced omentum. The fascia was reapproximated using 2 times 0 looped Novafils. The first was started inferiorly and was brought to the midportion of the incision the second one started superiorly and brought to the first. These were secured to each other. The subcuticular tissue was irrigated and the skin edges were reapproximated using skin staples. The skin was washed dried moist dry towel. Sterile dressings were placed with 4 x 4 gauze dressings. The drapes removed the dressings were secured. The patient was allowed to come out of general anesthetic. Transferred back to regular hospital bed. She is transferred to PACU in stable condition. At the conclusion of procedure all instrument sponge  and needle counts are correct. Patient tolerated the procedure extremely well.  Complications:  None  EBL:  Less than 50 mL  Specimen:  None

## 2010-09-22 NOTE — Anesthesia Postprocedure Evaluation (Addendum)
  Anesthesia Post-op Note  Patient: [name] [name]  Procedure(s) Performed:  EXPLORATORY LAPAROTOMY - Lysis of Adhesions  Patient Location: PACU  Anesthesia Type: General  Level of Consciousness: awake, alert  and oriented  Airway and Oxygen Therapy: Patient Spontanous Breathing and Patient connected to face mask oxygen  Post-op Pain: none  Post-op Assessment: Post-op Vital signs reviewed, Patient's Cardiovascular Status Stable and Respiratory Function Stable  Post-op Vital Signs: stable  Complications: No apparent anesthesia complications Pt alert and feeling well.  VSS, no apparent anesthesia complications.

## 2010-09-22 NOTE — Progress Notes (Signed)
Pt transported in bed to OR. NG tube in place. IV site wnl. Foley cath patent. Pt alert and oriented. Fami8ly at bedside taken to surgical waiting room by OR transport staff.

## 2010-09-23 ENCOUNTER — Inpatient Hospital Stay (HOSPITAL_COMMUNITY): Payer: Medicare Other

## 2010-09-23 ENCOUNTER — Encounter (HOSPITAL_COMMUNITY): Payer: Medicare Other

## 2010-09-23 LAB — GLUCOSE, CAPILLARY
Glucose-Capillary: 72 mg/dL (ref 70–99)
Glucose-Capillary: 74 mg/dL (ref 70–99)
Glucose-Capillary: 75 mg/dL (ref 70–99)
Glucose-Capillary: 83 mg/dL (ref 70–99)
Glucose-Capillary: 85 mg/dL (ref 70–99)
Glucose-Capillary: 86 mg/dL (ref 70–99)
Glucose-Capillary: 94 mg/dL (ref 70–99)

## 2010-09-23 LAB — CBC
HCT: 31 % — ABNORMAL LOW (ref 36.0–46.0)
Hemoglobin: 10.3 g/dL — ABNORMAL LOW (ref 12.0–15.0)
MCH: 28.5 pg (ref 26.0–34.0)
MCHC: 33.2 g/dL (ref 30.0–36.0)
MCV: 85.6 fL (ref 78.0–100.0)
Platelets: 172 10*3/uL (ref 150–400)
RBC: 3.62 MIL/uL — ABNORMAL LOW (ref 3.87–5.11)
RDW: 15.8 % — ABNORMAL HIGH (ref 11.5–15.5)
WBC: 14.8 10*3/uL — ABNORMAL HIGH (ref 4.0–10.5)

## 2010-09-23 LAB — BASIC METABOLIC PANEL
BUN: 17 mg/dL (ref 6–23)
CO2: 22 mEq/L (ref 19–32)
Calcium: 7.3 mg/dL — ABNORMAL LOW (ref 8.4–10.5)
Chloride: 104 mEq/L (ref 96–112)
Creatinine, Ser: 0.98 mg/dL (ref 0.50–1.10)
GFR calc Af Amer: >60 mL/min (ref >60)
GFR calc non Af Amer: 55 mL/min — ABNORMAL LOW (ref >60)
Glucose, Bld: 80 mg/dL (ref 70–99)
Potassium: 3.7 mEq/L (ref 3.5–5.1)
Sodium: 137 mEq/L (ref 135–145)

## 2010-09-23 LAB — PROTIME-INR
INR: 1.35 (ref 0.00–1.49)
Prothrombin Time: 16.9 seconds — ABNORMAL HIGH (ref 11.6–15.2)

## 2010-09-23 MED ORDER — SODIUM CHLORIDE 0.9 % IJ SOLN
10.0000 mL | INTRAMUSCULAR | Status: DC | PRN
  Administered 2010-09-23 – 2010-09-29 (×6): 10 mL
  Filled 2010-09-23 (×10): qty 10

## 2010-09-23 MED ORDER — SODIUM CHLORIDE 0.9 % IJ SOLN
10.0000 mL | Freq: Two times a day (BID) | INTRAMUSCULAR | Status: DC
  Administered 2010-09-24 – 2010-10-01 (×14): 10 mL
  Filled 2010-09-23 (×11): qty 10

## 2010-09-23 NOTE — Progress Notes (Signed)
Encounter addended by: Tressie Stalker on: 09/23/2010  4:49 PM<BR>     Documentation filed: Notes Section

## 2010-09-23 NOTE — Progress Notes (Signed)
1 Day Post-Op  Subjective: Pain controlled other than in feet 2nd to gout.  No nausea.  No fever or chills.   Objective: Vital signs in last 24 hours: Temp:  [97.3 F (36.3 C)-99.5 F (37.5 C)] 98.2 F (36.8 C) (08/18 1200) Pulse Rate:  [87-120] 109  (08/18 1200) Resp:  [18-22] 20  (08/18 1200) BP: (129-157)/(65-90) 146/72 mmHg (08/18 1200) SpO2:  [95 %-100 %] 97 % (08/18 1200) Weight:  [78.4 kg (172 lb 13.5 oz)] 172 lb 13.5 oz (78.4 kg) (08/18 0400) Last BM Date: 09/19/10  Intake/Output from previous day: 08/17 0701 - 08/18 0700 In: 2070 [I.V.:1970; IV Piggyback:100] Out: 2055 [Urine:1000; Blood:50] Intake/Output this shift: I/O this shift: In: 630 [I.V.:630] Out: 400 [Urine:400]  General appearance: alert and no distress GI: Soft, quiet, expected tenderness.  Dressing c/d/i.    Lab Results:  @LABLAST2 (wbc:2,hgb:2,hct:2,plt:2) BMET  Basename 09/23/10 0459 09/22/10 0431  NA 137 138  K 3.7 3.9  CL 104 101  CO2 22 23  GLUCOSE 80 88  BUN 17 22  CREATININE 0.98 1.13*  CALCIUM 7.3* 7.9*   PT/INR  Basename 09/23/10 0459 09/22/10 0431  LABPROT 16.9* 16.2*  INR 1.35 1.27   ABG No results found for this basename: PHART:2,PCO2:2,PO2:2,HCO3:2 in the last 72 hours  Studies/Results: No results found.  Anti-infectives: Anti-infectives     Start     Dose/Rate Route Frequency Ordered Stop   09/22/10 1430   ceFAZolin (ANCEF) IVPB 1 g/50 mL premix        1 g 100 mL/hr over 30 Minutes Intravenous  Once 09/22/10 1422 09/22/10 1500   09/22/10 1429   ceFAZolin (ANCEF) 1 G injection     Comments: DANIEL, KAREN: cabinet override         09/22/10 1429 09/22/10 1432   09/22/10 0930   cefOXitin (MEFOXIN) 1 g in dextrose 5 % 50 mL IVPB        1 g 100 mL/hr over 30 Minutes Intravenous 60 min pre-op 09/21/10 2019 09/22/10 1042          Assessment/Plan: s/p Procedure(s): EXPLORATORY LAPAROTOMY S/p LOA.  Cont NG for now.  Await Bowel fxn.  Increase activity.  OOB  tomorrow.    LOS: 7 days    Narcisa Ganesh C 09/23/2010

## 2010-09-23 NOTE — Progress Notes (Signed)
IV SITES X2 INFILTRATED. BOTH D/C'D. UNABLE TO RESTART AFTER SEVERAL TRYS BY 3 DIIFFERNT RNS. ORDER RECEIVED FOR PICC LINE PLACEMENT. PICC NURSE IN BUILDING AND PREFORMED PROCEDURE. DUAL LUMEN PICC INSERT IN RT UPPER ARM BY E. HAYES RN

## 2010-09-23 NOTE — Progress Notes (Signed)
Subjective: Feels okay. Asking for something to drink. No nausea. No dyspnea.  Objective: Vital signs in last 24 hours: Filed Vitals:   09/23/10 0400 09/23/10 0500 09/23/10 0600 09/23/10 0900  BP: 143/82  136/73 132/66  Pulse: 100 99 103 101  Temp: 99 F (37.2 C)     TempSrc: Axillary     Resp: 20 19 20 20   Height:      Weight: 78.4 kg (172 lb 13.5 oz)     SpO2: 97% 97% 98% 100%    Intake/Output Summary (Last 24 hours) at 09/23/10 0954 Last data filed at 09/23/10 0601  Gross per 24 hour  Intake   1765 ml  Output   1805 ml  Net    -40 ml   Tele: a fib rate around 100  Weight change: 2 kg (4 lb 6.6 oz)  General appearance: Comfortable Resp: clear to auscultation bilaterally, without wheezes rhonchi or rales Cardio: irregularly irregular rhythm, with tachycardia. GI: soft, nondistended;  bowel sounds present; no masses,  no organomegaly Extremities: SCD  Lab Results: Basic Metabolic Panel:  Basename 09/23/10 0459 09/22/10 0431  NA 137 138  K 3.7 3.9  CL 104 101  CO2 22 23  GLUCOSE 80 88  BUN 17 22  CREATININE 0.98 1.13*  CALCIUM 7.3* 7.9*  MG -- --  PHOS -- --   Liver Function Tests:  Basename 09/22/10 0431  AST 15  ALT 14  ALKPHOS 68  BILITOT 2.2*  PROT 5.8*  ALBUMIN 2.6*   No results found for this basename: LIPASE:2,AMYLASE:2 in the last 72 hours CBC:  Basename 09/23/10 0459 09/22/10 0431  WBC 14.8* 9.4  NEUTROABS -- 6.6  HGB 10.3* 10.5*  HCT 31.0* 31.5*  MCV 85.6 86.1  PLT 172 159   Cardiac Enzymes: No results found for this basename: CKTOTAL:3,CKMB:3,CKMBINDEX:3,TROPONINI:3 in the last 72 hours BNP: No results found for this basename: POCBNP:3 in the last 72 hours D-Dimer: No results found for this basename: DDIMER:2 in the last 72 hours CBG:  Basename 09/23/10 0726 09/23/10 0428 09/23/10 0015 09/22/10 2009 09/22/10 1625 09/22/10 1431  GLUCAP 72 75 85 87 96 101*   Hemoglobin A1C: No results found for this basename: HGBA1C in the  last 72 hours Fasting Lipid Panel: No results found for this basename: CHOL,HDL,LDLCALC,TRIG,CHOLHDL,LDLDIRECT in the last 72 hours Thyroid Function Tests: No results found for this basename: TSH,T4TOTAL,FREET4,T3FREE,THYROIDAB in the last 72 hours Anemia Panel: No results found for this basename: VITAMINB12,FOLATE,FERRITIN,TIBC,IRON,RETICCTPCT in the last 72 hours  Medications: I have reviewed the patient's current medications.  Assessment/Plan: Principal Problem:  *SBO (small bowel obstruction) status post exploratory laparotomy and lysis of adhesion. Doing well postoperatively. Active Problems:  Chronic a-fib: Continue IV metoprolol and diltiazem while n.p.o. for rate control.  DM type 2 (diabetes mellitus, type 2): Continue current  Gout attack: ARF (acute renal failure): Resolved.  CKD (chronic kidney disease)  Anemia: Stable  Borderline abnormal TFTs: Repeat outpatient thyroid function tests once a acute illness has resolved.  Warfarin anticoagulation: Reversed.    LOS: 7 days   Alayzia Pavlock L 09/23/2010, 9:54 AM

## 2010-09-24 LAB — GLUCOSE, CAPILLARY
Glucose-Capillary: 100 mg/dL — ABNORMAL HIGH (ref 70–99)
Glucose-Capillary: 99 mg/dL (ref 70–99)
Glucose-Capillary: 113 mg/dL — ABNORMAL HIGH (ref 70–99)
Glucose-Capillary: 136 mg/dL — ABNORMAL HIGH (ref 70–99)
Glucose-Capillary: 139 mg/dL — ABNORMAL HIGH (ref 70–99)

## 2010-09-24 LAB — COMPREHENSIVE METABOLIC PANEL
ALT: 12 U/L (ref 0–35)
AST: 15 U/L (ref 0–37)
Albumin: 2.1 g/dL — ABNORMAL LOW (ref 3.5–5.2)
Alkaline Phosphatase: 82 U/L (ref 39–117)
BUN: 20 mg/dL (ref 6–23)
CO2: 21 mEq/L (ref 19–32)
Calcium: 7.6 mg/dL — ABNORMAL LOW (ref 8.4–10.5)
Chloride: 105 mEq/L (ref 96–112)
Creatinine, Ser: 0.96 mg/dL (ref 0.50–1.10)
GFR calc Af Amer: >60 mL/min (ref >60)
GFR calc non Af Amer: 56 mL/min — ABNORMAL LOW (ref >60)
Glucose, Bld: 102 mg/dL — ABNORMAL HIGH (ref 70–99)
Potassium: 3.5 mEq/L (ref 3.5–5.1)
Sodium: 140 mEq/L (ref 135–145)
Total Bilirubin: 1.8 mg/dL — ABNORMAL HIGH (ref 0.3–1.2)
Total Protein: 5.6 g/dL — ABNORMAL LOW (ref 6.0–8.3)

## 2010-09-24 LAB — CBC
HCT: 31.6 % — ABNORMAL LOW (ref 36.0–46.0)
Hemoglobin: 10.5 g/dL — ABNORMAL LOW (ref 12.0–15.0)
MCH: 28.3 pg (ref 26.0–34.0)
MCHC: 33.2 g/dL (ref 30.0–36.0)
MCV: 85.2 fL (ref 78.0–100.0)
Platelets: 187 10*3/uL (ref 150–400)
RBC: 3.71 MIL/uL — ABNORMAL LOW (ref 3.87–5.11)
RDW: 16 % — ABNORMAL HIGH (ref 11.5–15.5)
WBC: 19.7 10*3/uL — ABNORMAL HIGH (ref 4.0–10.5)

## 2010-09-24 MED ORDER — FLUCONAZOLE IN SODIUM CHLORIDE 200-0.9 MG/100ML-% IV SOLN
INTRAVENOUS | Status: AC
  Filled 2010-09-24: qty 100

## 2010-09-24 MED ORDER — KETOROLAC TROMETHAMINE 30 MG/ML IJ SOLN
30.0000 mg | Freq: Once | INTRAMUSCULAR | Status: AC
  Administered 2010-09-24: 30 mg via INTRAVENOUS
  Filled 2010-09-24: qty 1

## 2010-09-24 MED ORDER — ENOXAPARIN SODIUM 80 MG/0.8ML ~~LOC~~ SOLN
1.0000 mg/kg | Freq: Two times a day (BID) | SUBCUTANEOUS | Status: DC
Start: 2010-09-24 — End: 2010-09-27
  Administered 2010-09-24 – 2010-09-27 (×6): 80 mg via SUBCUTANEOUS
  Filled 2010-09-24 (×7): qty 0.8

## 2010-09-24 MED ORDER — DIGOXIN 0.25 MG/ML IJ SOLN
0.2500 mg | Freq: Four times a day (QID) | INTRAMUSCULAR | Status: AC
  Administered 2010-09-24 (×2): 0.25 mg via INTRAVENOUS
  Filled 2010-09-24 (×2): qty 2

## 2010-09-24 MED ORDER — METHYLPREDNISOLONE SODIUM SUCC 40 MG IJ SOLR
40.0000 mg | Freq: Once | INTRAMUSCULAR | Status: AC
  Administered 2010-09-24: 40 mg via INTRAVENOUS
  Filled 2010-09-24: qty 1

## 2010-09-24 MED ORDER — DIGOXIN 0.25 MG/ML IJ SOLN
0.2500 mg | Freq: Every day | INTRAMUSCULAR | Status: DC
  Filled 2010-09-24: qty 2

## 2010-09-24 MED ORDER — FLUCONAZOLE IN SODIUM CHLORIDE 200-0.9 MG/100ML-% IV SOLN
200.0000 mg | Freq: Once | INTRAVENOUS | Status: AC
  Administered 2010-09-24: 200 mg via INTRAVENOUS
  Filled 2010-09-24: qty 100

## 2010-09-24 NOTE — Progress Notes (Addendum)
Subjective: Feels much better today. Now, gout is bothering her in both feet. She had a difficult time getting up out of bed yesterday because of the gout pain. Complaining of vaginal itching, white discharge, yeast infection  Objective: Vital signs in last 24 hours: Filed Vitals:   09/24/10 0600 09/24/10 0615 09/24/10 0730 09/24/10 0800  BP: 137/65 126/65 131/73 130/61  Pulse: 110 109 117 100  Temp:    98.2 F (36.8 C)  TempSrc:    Oral  Resp: 22 21 22 20   Height:      Weight:      SpO2:   92% 94%    Intake/Output Summary (Last 24 hours) at 09/24/10 1021 Last data filed at 09/24/10 0600  Gross per 24 hour  Intake 1848.33 ml  Output    900 ml  Net 948.33 ml   Tele: a fib rate around 110  Weight change: 1.5 kg (3 lb 4.9 oz)  General appearance: Comfortable Resp: clear to auscultation bilaterally, without wheezes rhonchi or rales Cardio: irregularly irregular rhythm, with tachycardia. GI: soft, nondistended;  bowel sounds present; no masses,  no organomegaly Extremities: SCDs. No tenderness or warmth in her feet. GU: White vaginal discharge  Lab Results: Basic Metabolic Panel:  Basename 09/24/10 0519 09/23/10 0459  NA 140 137  K 3.5 3.7  CL 105 104  CO2 21 22  GLUCOSE 102* 80  BUN 20 17  CREATININE 0.96 0.98  CALCIUM 7.6* 7.3*  MG -- --  PHOS -- --   Liver Function Tests:  [facility] 09/24/10 0519 09/22/10 0431  AST 15 15  ALT 12 14  ALKPHOS 82 68  BILITOT 1.8* 2.2*  PROT 5.6* 5.8*  ALBUMIN 2.1* 2.6*   No results found for this basename: LIPASE:2,AMYLASE:2 in the last 72 hours CBC:  Basename 09/24/10 0519 09/23/10 0459 09/22/10 0431  WBC 19.7* 14.8* --  NEUTROABS -- -- 6.6  HGB 10.5* 10.3* --  HCT 31.6* 31.0* --  MCV 85.2 85.6 --  PLT 187 172 --   Cardiac Enzymes: No results found for this basename: CKTOTAL:3,CKMB:3,CKMBINDEX:3,TROPONINI:3 in the last 72 hours BNP: No results found for this basename: POCBNP:3 in the last 72 hours D-Dimer: No  results found for this basename: DDIMER:2 in the last 72 hours CBG:  Basename 09/24/10 0720 09/24/10 0417 09/23/10 2347 09/23/10 2031 09/23/10 1621 09/23/10 1153  GLUCAP 99 100* 94 86 83 74   Hemoglobin A1C: No results found for this basename: HGBA1C in the last 72 hours Fasting Lipid Panel: No results found for this basename: CHOL,HDL,LDLCALC,TRIG,CHOLHDL,LDLDIRECT in the last 72 hours Thyroid Function Tests: No results found for this basename: TSH,T4TOTAL,FREET4,T3FREE,THYROIDAB in the last 72 hours Anemia Panel: No results found for this basename: VITAMINB12,FOLATE,FERRITIN,TIBC,IRON,RETICCTPCT in the last 72 hours T. Jackson. Medications: I have reviewed the patient's current medications.  Assessment/Plan: Principal Problem:  *SBO (small bowel obstruction) status post exploratory laparotomy and lysis of adhesion. Doing well postoperatively. Active Problems:  Chronic a-fib: Continue IV metoprolol and diltiazem while n.p.o. for rate control. Nurses had to increase the Cardizem drip to 15 mg an hour. She cannot move to telemetry until off the Cardizem drip. I will start IV digoxin.  DM type 2 (diabetes mellitus, type 2): Continue current  Gout attack: Now that her renal function has normalized, I will give a single dose of Toradol. Because she is an NG tube and is n.p.o., I am hesitant to give it more frequently. I will also give another dose of IV Solu-Medrol. Once  she is taking a diet she can have colchicine tablets. ARF (acute renal failure): Resolved.  CKD (chronic kidney disease)  Anemia: Stable  Borderline abnormal TFTs: Repeat outpatient thyroid function tests once acute illness has resolved. Not entirely consistent with hyperthyroidism. Could be euthyroid sick syndrome.  Warfarin anticoagulation: Resume warfarin once okay with surgery. PT consult Candidal vulvovaginitis: Will give Diflucan today.   LOS: 8 days   Vicci Reder L 09/24/2010, 10:21 AM

## 2010-09-24 NOTE — Progress Notes (Signed)
2 Days Post-Op  Subjective: No nausea.  Pain somewhat controlled but "still there".  No flatus.  Feet still hurt.  No fevers but some "chills".  Eating a lot of ice chips.  Objective: Vital signs in last 24 hours: Temp:  [97.9 F (36.6 C)-98.8 F (37.1 C)] 98.2 F (36.8 C) (08/19 0800) Pulse Rate:  [100-145] 100  (08/19 0800) Resp:  [19-25] 20  (08/19 0800) BP: (118-177)/(57-141) 130/61 mmHg (08/19 0800) SpO2:  [87 %-95 %] 94 % (08/19 0800) Weight:  [79.9 kg (176 lb 2.4 oz)] 176 lb 2.4 oz (79.9 kg) (08/19 0400) Last BM Date: 09/19/10  Intake/Output from previous day: 08/18 0701 - 08/19 0700 In: 2163.3 [I.V.:2163.3] Out: 900 [Urine:900] Intake/Output this shift:    General appearance: alert and no distress Resp: diminished breath sounds bibasilar Cardio: irregularly irregular rhythm GI: quiet, soft expected tenderness.  Inc c/d/i.  Lab Results:  @LABLAST2 (wbc:2,hgb:2,hct:2,plt:2) BMET  Basename 09/24/10 0519 09/23/10 0459  NA 140 137  K 3.5 3.7  CL 105 104  CO2 21 22  GLUCOSE 102* 80  BUN 20 17  CREATININE 0.96 0.98  CALCIUM 7.6* 7.3*   PT/INR  Basename 09/23/10 0459 09/22/10 0431  LABPROT 16.9* 16.2*  INR 1.35 1.27   ABG No results found for this basename: PHART:2,PCO2:2,PO2:2,HCO3:2 in the last 72 hours  Studies/Results: Dg Chest Portable 1 View  09/23/2010  *RADIOLOGY REPORT*  Clinical Data:  PICC line placement  PORTABLE CHEST - 1 VIEW  Comparison: Portable exam 1706 hours repeated at 1723 hours compared to 09/16/2010  Findings: Right arm PICC line, tip at cavoatrial junction. Nasogastric tube extends into abdomen. Enlargement of cardiac silhouette. Pulmonary vascular congestion. Atherosclerotic calcification aorta. Bibasilar atelectasis. No definite acute infiltrate or edema. No pneumothorax.  IMPRESSION: Tip of right arm PICC line at cavoatrial junction. Enlargement of cardiac silhouette with pulmonary vascular congestion. Minimal bibasilar atelectasis.   Original Report Authenticated By: Burnetta Sabin, M.D.    Anti-infectives: Anti-infectives     Start     Dose/Rate Route Frequency Ordered Stop   09/22/10 1430   ceFAZolin (ANCEF) IVPB 1 g/50 mL premix        1 g 100 mL/hr over 30 Minutes Intravenous  Once 09/22/10 1422 09/22/10 1500   09/22/10 1429   ceFAZolin (ANCEF) 1 G injection     Comments: DANIEL, KAREN: cabinet override         09/22/10 1429 09/22/10 1432   09/22/10 0930   cefOXitin (MEFOXIN) 1 g in dextrose 5 % 50 mL IVPB        1 g 100 mL/hr over 30 Minutes Intravenous 60 min pre-op 09/21/10 2019 09/22/10 1042          Assessment/Plan: s/p Procedure(s): EXPLORATORY LAPAROTOMY Await Bowel fxn.  Cont NG, increase activity.  Continue Foley until tomorrow to monitor I/O and due to limited mobitily and patient request.  Will d/c foley tomorrow.  LOS: 8 days    Kryslyn Helbig C 09/24/2010

## 2010-09-25 DIAGNOSIS — D72829 Elevated white blood cell count, unspecified: Secondary | ICD-10-CM | POA: Diagnosis present

## 2010-09-25 LAB — CBC
HCT: 30.6 % — ABNORMAL LOW (ref 36.0–46.0)
Hemoglobin: 10.3 g/dL — ABNORMAL LOW (ref 12.0–15.0)
MCH: 28.5 pg (ref 26.0–34.0)
MCHC: 33.7 g/dL (ref 30.0–36.0)
MCV: 84.8 fL (ref 78.0–100.0)
Platelets: 199 10*3/uL (ref 150–400)
RBC: 3.61 MIL/uL — ABNORMAL LOW (ref 3.87–5.11)
RDW: 15.5 % (ref 11.5–15.5)
WBC: 18.7 10*3/uL — ABNORMAL HIGH (ref 4.0–10.5)

## 2010-09-25 LAB — GLUCOSE, CAPILLARY
Glucose-Capillary: 117 mg/dL — ABNORMAL HIGH (ref 70–99)
Glucose-Capillary: 127 mg/dL — ABNORMAL HIGH (ref 70–99)
Glucose-Capillary: 131 mg/dL — ABNORMAL HIGH (ref 70–99)
Glucose-Capillary: 154 mg/dL — ABNORMAL HIGH (ref 70–99)
Glucose-Capillary: 181 mg/dL — ABNORMAL HIGH (ref 70–99)
Glucose-Capillary: 187 mg/dL — ABNORMAL HIGH (ref 70–99)

## 2010-09-25 MED ORDER — POTASSIUM CHLORIDE 2 MEQ/ML IV SOLN
INTRAVENOUS | Status: DC
  Administered 2010-09-25 – 2010-09-26 (×4): via INTRAVENOUS
  Filled 2010-09-25 (×6): qty 1000

## 2010-09-25 MED ORDER — DIGOXIN 0.25 MG/ML IJ SOLN
0.1250 mg | Freq: Every day | INTRAMUSCULAR | Status: DC
  Administered 2010-09-25 – 2010-09-26 (×2): 0.125 mg via INTRAVENOUS
  Filled 2010-09-25 (×2): qty 2

## 2010-09-25 NOTE — Progress Notes (Signed)
Subjective: She says her "stomach" doesn't feel as good today. She passed flatus last night. No BM.  Objective: Vital signs in last 24 hours: Filed Vitals:   09/25/10 0300 09/25/10 0400 09/25/10 0500 09/25/10 0600  BP: 147/61 140/67 148/60 144/70  Pulse: 95 92 90 92  Temp:  97.9 F (36.6 C)    TempSrc:  Axillary    Resp: 19 17 19 19   Height:      Weight:  81.5 kg (179 lb 10.8 oz)    SpO2: 95% 94% 96% 95%    Intake/Output Summary (Last 24 hours) at 09/25/10 0758 Last data filed at 09/25/10 0600  Gross per 24 hour  Intake   2235 ml  Output    500 ml  Net   1735 ml   Tele: a fib rate around 110  Weight change: 1.6 kg (3 lb 8.4 oz)  General appearance: Comfortable Resp: clear to auscultation bilaterally, without wheezes rhonchi or rales Cardio: irregularly irregular rhythm, with tachycardia. GI: soft, nondistended;  bowel sounds present; mild diffuse tenderness; bandage in place-not removed. Extremities: No tenderness or warmth in her feet; trace pedal edema.   Lab Results: Basic Metabolic Panel:  Basename 09/24/10 0519 09/23/10 0459  NA 140 137  K 3.5 3.7  CL 105 104  CO2 21 22  GLUCOSE 102* 80  BUN 20 17  CREATININE 0.96 0.98  CALCIUM 7.6* 7.3*  MG -- --  PHOS -- --   Liver Function Tests:  [facility] 09/24/10 0519  AST 15  ALT 12  ALKPHOS 82  BILITOT 1.8*  PROT 5.6*  ALBUMIN 2.1*   No results found for this basename: LIPASE:2,AMYLASE:2 in the last 72 hours CBC:  Basename 09/25/10 0456 09/24/10 0519  WBC 18.7* 19.7*  NEUTROABS -- --  HGB 10.3* 10.5*  HCT 30.6* 31.6*  MCV 84.8 85.2  PLT 199 187   Cardiac Enzymes: No results found for this basename: CKTOTAL:3,CKMB:3,CKMBINDEX:3,TROPONINI:3 in the last 72 hours BNP: No results found for this basename: POCBNP:3 in the last 72 hours D-Dimer: No results found for this basename: DDIMER:2 in the last 72 hours CBG:  Basename 09/25/10 0734 09/25/10 0345 09/25/10 09/24/10 2044 09/24/10 1611 09/24/10  1129  GLUCAP 117* 127* 131* 139* 136* 113*   Hemoglobin A1C: No results found for this basename: HGBA1C in the last 72 hours Fasting Lipid Panel: No results found for this basename: CHOL,HDL,LDLCALC,TRIG,CHOLHDL,LDLDIRECT in the last 72 hours Thyroid Function Tests: No results found for this basename: TSH,T4TOTAL,FREET4,T3FREE,THYROIDAB in the last 72 hours Anemia Panel: No results found for this basename: VITAMINB12,FOLATE,FERRITIN,TIBC,IRON,RETICCTPCT in the last 72 hours T. Jackson. Medications: I have reviewed the patient's current medications.  Assessment/Plan: Principal Problem:  *SBO (small bowel obstruction) status post exploratory laparotomy and lysis of adhesion. Doing well postoperatively. Active Problems:  Chronic a-fib: Remains on Cardizem gtt, IV Metoprolol, and IV Digoxin, which was started yesterday Will decrease IV Dig to avoid nausea.  Leukocytosis: Post-op. May also be sec to recent steroids for gout.   DM type 2 (diabetes mellitus, type 2): Continue current Tx.   Gout attack: S/p 1 dose of Toradol. Given Solumedrol on several occasions.   ARF (acute renal failure): Resolved.   CKD (chronic kidney disease)  Anemia: Stable  Borderline abnormal TFTs: Repeat outpatient thyroid function tests once acute illness has resolved. Not entirely consistent with hyperthyroidism. Could be euthyroid sick syndrome.  Warfarin anticoagulation: Resume warfarin once okay with surgery. PT consult Candidal vulvovaginitis: Diflucan given.   LOS: 9 days  Verenise Moulin 09/25/2010, 7:58 AM

## 2010-09-25 NOTE — Progress Notes (Signed)
3 Days Post-Op  Subjective: Pain better. Still some epigastric discomfort.  No nausea.  Hungry.  +flatus.  Objective: Vital signs in last 24 hours: Temp:  [97.5 F (36.4 [name])-97.9 F (36.6 [name])] 97.9 F (36.6 [name]) (08/20 0400) Pulse Rate:  [90-113] 92  (08/20 0600) Resp:  [17-27] 19  (08/20 0600) BP: (113-148)/(44-80) 144/70 mmHg (08/20 0600) SpO2:  [21 %-98 %] 95 % (08/20 0600) Weight:  [81.5 kg (179 lb 10.8 oz)] 179 lb 10.8 oz (81.5 kg) (08/20 0400) Last BM Date: 09/19/10  Intake/Output from previous day: 08/19 0701 - 08/20 0700 In: 2235 [I.V.:2235] Out: 500 [Urine:500] Intake/Output this shift:    General appearance: alert and no distress GI: +BS, soft flat, expected minimal tenderness.  Inc [name]/d/i.  Lab Results:  @LABLAST2 (wbc:2,hgb:2,hct:2,plt:2) BMET  Basename 09/24/10 0519 09/23/10 0459  NA 140 137  K 3.5 3.7  CL 105 104  CO2 21 22  GLUCOSE 102* 80  BUN 20 17  CREATININE 0.96 0.98  CALCIUM 7.6* 7.3*   PT/INR  Basename 09/23/10 0459  LABPROT 16.9*  INR 1.35   ABG No results found for this basename: PHART:2,PCO2:2,PO2:2,HCO3:2 in the last 72 hours  Studies/Results: Dg Chest Portable 1 View  09/23/2010  *RADIOLOGY REPORT*  Clinical Data:  PICC line placement  PORTABLE CHEST - 1 VIEW  Comparison: Portable exam 1706 hours repeated at 1723 hours compared to 09/16/2010  Findings: Right arm PICC line, tip at cavoatrial junction. Nasogastric tube extends into abdomen. Enlargement of cardiac silhouette. Pulmonary vascular congestion. Atherosclerotic calcification aorta. Bibasilar atelectasis. No definite acute infiltrate or edema. No pneumothorax.  IMPRESSION: Tip of right arm PICC line at cavoatrial junction. Enlargement of cardiac silhouette with pulmonary vascular congestion. Minimal bibasilar atelectasis.  Original Report Authenticated By: Burnetta Sabin, M.D.    Anti-infectives: Anti-infectives     Start     Dose/Rate Route Frequency Ordered Stop   09/24/10 1800    fluconazole (DIFLUCAN) IVPB 200 mg        200 mg 100 mL/hr over 60 Minutes Intravenous  Once 09/24/10 1747 09/24/10 1959   09/22/10 1430   ceFAZolin (ANCEF) IVPB 1 g/50 mL premix        1 g 100 mL/hr over 30 Minutes Intravenous  Once 09/22/10 1422 09/22/10 1500   09/22/10 1429   ceFAZolin (ANCEF) 1 G injection     Comments: DANIEL, KAREN: cabinet override         09/22/10 1429 09/22/10 1432   09/22/10 0930   cefOXitin (MEFOXIN) 1 g in dextrose 5 % 50 mL IVPB        1 g 100 mL/hr over 30 Minutes Intravenous 60 min pre-op 09/21/10 2019 09/22/10 1042          Assessment/Plan: s/p Procedure(s): EXPLORATORY LAPAROTOMY Slow improvement.  Doing well.  Clamp NG.  Start clears.  Check residual 4 hrs and if less 200 ml will d/[name].  D/[name] foley.  Ok from my standpoint to transfer to floor per [facility].  LOS: 9 days    [name] [name] 09/25/2010

## 2010-09-26 LAB — CBC
HCT: 31.1 % — ABNORMAL LOW (ref 36.0–46.0)
Hemoglobin: 10.3 g/dL — ABNORMAL LOW (ref 12.0–15.0)
MCH: 28.4 pg (ref 26.0–34.0)
MCHC: 33.1 g/dL (ref 30.0–36.0)
MCV: 85.7 fL (ref 78.0–100.0)
Platelets: 216 10*3/uL (ref 150–400)
RBC: 3.63 MIL/uL — ABNORMAL LOW (ref 3.87–5.11)
RDW: 15.7 % — ABNORMAL HIGH (ref 11.5–15.5)
WBC: 17.4 10*3/uL — ABNORMAL HIGH (ref 4.0–10.5)

## 2010-09-26 LAB — GLUCOSE, CAPILLARY
Glucose-Capillary: 162 mg/dL — ABNORMAL HIGH (ref 70–99)
Glucose-Capillary: 120 mg/dL — ABNORMAL HIGH (ref 70–99)
Glucose-Capillary: 110 mg/dL — ABNORMAL HIGH (ref 70–99)
Glucose-Capillary: 153 mg/dL — ABNORMAL HIGH (ref 70–99)
Glucose-Capillary: 123 mg/dL — ABNORMAL HIGH (ref 70–99)

## 2010-09-26 LAB — BASIC METABOLIC PANEL
BUN: 36 mg/dL — ABNORMAL HIGH (ref 6–23)
CO2: 21 mEq/L (ref 19–32)
Calcium: 8.1 mg/dL — ABNORMAL LOW (ref 8.4–10.5)
Chloride: 111 mEq/L (ref 96–112)
Creatinine, Ser: 1.05 mg/dL (ref 0.50–1.10)
GFR calc Af Amer: >60 mL/min (ref >60)
GFR calc non Af Amer: 51 mL/min — ABNORMAL LOW (ref >60)
Glucose, Bld: 153 mg/dL — ABNORMAL HIGH (ref 70–99)
Potassium: 4.4 mEq/L (ref 3.5–5.1)
Sodium: 141 mEq/L (ref 135–145)

## 2010-09-26 MED ORDER — SODIUM CHLORIDE 0.9 % IV SOLN
INTRAVENOUS | Status: DC
  Administered 2010-09-26: 19:00:00 via INTRAVENOUS
  Filled 2010-09-26 (×2): qty 1000

## 2010-09-26 MED ORDER — CARVEDILOL 12.5 MG PO TABS
12.5000 mg | ORAL_TABLET | Freq: Two times a day (BID) | ORAL | Status: DC
  Administered 2010-09-26 – 2010-09-30 (×9): 12.5 mg via ORAL
  Filled 2010-09-26 (×8): qty 1

## 2010-09-26 MED ORDER — VANCOMYCIN HCL IN DEXTROSE 1-5 GM/200ML-% IV SOLN
INTRAVENOUS | Status: AC
  Filled 2010-09-26: qty 200

## 2010-09-26 MED ORDER — DILTIAZEM HCL ER COATED BEADS 120 MG PO CP24
120.0000 mg | ORAL_CAPSULE | Freq: Every day | ORAL | Status: DC
  Administered 2010-09-26 – 2010-10-01 (×6): 120 mg via ORAL
  Filled 2010-09-26 (×8): qty 1

## 2010-09-26 MED ORDER — VANCOMYCIN HCL 1000 MG IV SOLR: 1250.0000 mg | INTRAVENOUS | Status: DC

## 2010-09-26 MED ORDER — MORPHINE SULFATE 2 MG/ML IJ SOLN
1.0000 mg | INTRAMUSCULAR | Status: DC | PRN
  Administered 2010-09-30: 2 mg via INTRAVENOUS
  Filled 2010-09-26: qty 1

## 2010-09-26 MED ORDER — VANCOMYCIN HCL IN DEXTROSE 1-5 GM/200ML-% IV SOLN
1000.0000 mg | Freq: Once | INTRAVENOUS | Status: AC
  Administered 2010-09-26: 1000 mg via INTRAVENOUS
  Filled 2010-09-26: qty 200

## 2010-09-26 MED ORDER — CARVEDILOL 12.5 MG PO TABS
12.5000 mg | ORAL_TABLET | Freq: Two times a day (BID) | ORAL | Status: DC
  Filled 2010-09-26: qty 1

## 2010-09-26 MED ORDER — INSULIN ASPART 100 UNIT/ML ~~LOC~~ SOLN
0.0000 [IU] | SUBCUTANEOUS | Status: DC
  Administered 2010-09-26 – 2010-09-27 (×2): 1 [IU] via SUBCUTANEOUS
  Administered 2010-09-30: 2 [IU] via SUBCUTANEOUS
  Administered 2010-09-30: 1 [IU] via SUBCUTANEOUS
  Filled 2010-09-26: qty 3

## 2010-09-26 MED ORDER — COLCHICINE 0.6 MG PO TABS
0.6000 mg | ORAL_TABLET | Freq: Every day | ORAL | Status: DC
  Administered 2010-09-26 – 2010-09-28 (×3): 0.6 mg via ORAL
  Filled 2010-09-26 (×3): qty 1

## 2010-09-26 NOTE — Progress Notes (Signed)
4 Days Post-Op  Subjective: Pain is well-controlled. Tolerating clear liquids. Positive flatus. To have sizable bowel movement per patient's RN this morning. Still having bilateral foot pain.  Objective: Vital signs in last 24 hours: Temp:  [97.4 F (36.3 C)-98.8 F (37.1 C)] 97.7 F (36.5 C) (08/21 0130) Pulse Rate:  [72-74] 74  (08/21 0130) Resp:  [16] 16  (08/21 0130) BP: (155-164)/(77-84) 155/84 mmHg (08/21 0130) SpO2:  [99 %] 99 % (08/21 0130) Last BM Date: 09/24/10  Intake/Output from previous day: 08/20 0701 - 08/21 0700 In: -  Out: 225 [Urine:225] Intake/Output this shift: I/O this shift: In: 600 [I.V.:600] Out: -   General appearance: alert and no distress GI: Positive bowel sounds. Soft mildly obese, moderate expected tenderness. No peritoneal signs. Incision is clean dry and intact.  Lab Results:  @LABLAST2 (wbc:2,hgb:2,hct:2,plt:2) BMET  Basename 09/26/10 0510 09/24/10 0519  NA 141 140  K 4.4 3.5  CL 111 105  CO2 21 21  GLUCOSE 153* 102*  BUN 36* 20  CREATININE 1.05 0.96  CALCIUM 8.1* 7.6*   PT/INR No results found for this basename: LABPROT:2,INR:2 in the last 72 hours ABG No results found for this basename: PHART:2,PCO2:2,PO2:2,HCO3:2 in the last 72 hours  Studies/Results: No results found.  Anti-infectives: Anti-infectives     Start     Dose/Rate Route Frequency Ordered Stop   09/24/10 1800   fluconazole (DIFLUCAN) IVPB 200 mg        200 mg 100 mL/hr over 60 Minutes Intravenous  Once 09/24/10 1747 09/24/10 1959   09/22/10 1430   ceFAZolin (ANCEF) IVPB 1 g/50 mL premix        1 g 100 mL/hr over 30 Minutes Intravenous  Once 09/22/10 1422 09/22/10 1500   09/22/10 1429   ceFAZolin (ANCEF) 1 G injection     Comments: DANIEL, KAREN: cabinet override         09/22/10 1429 09/22/10 1432   09/22/10 0930   cefOXitin (MEFOXIN) 1 g in dextrose 5 % 50 mL IVPB        1 g 100 mL/hr over 30 Minutes Intravenous 60 min pre-op 09/21/10 2019 09/22/10 1042           Assessment/Plan: s/p Procedure(s): EXPLORATORY LAPAROTOMY continue to advance diet as tolerated. Increase activity. At this point it is safe to resume patient's doubt medication as well as resume Coumadin. Continue therapeutic Lovenox until INR is back to therapeutic levels. I agree with plans to transfer her to use her ankle surgical floor.  LOS: 10 days    [name] [name] C 09/26/2010

## 2010-09-26 NOTE — Progress Notes (Signed)
Physical Therapy Evaluation Patient Name: [name] [name] [id number] Date: 09/26/2010 Problem List:  Patient Active Problem List  Diagnoses  . Nausea and vomiting  . SBO (small bowel obstruction)  . ARF (acute renal failure)  . CKD (chronic kidney disease)  . Hypokalemia  . Chronic a-fib  . Warfarin anticoagulation  . Elevated troponin I level  . Hypotension  . Thrombocytopenia  . DM type 2 (diabetes mellitus, type 2)  . Hypomagnesemia  . Hypocalcemia  . Hypernatremia  . Gout attack  . Anemia  . Borderline abnormal TFTs  . Leukocytosis (leucocytosis)   Past Medical History:  Past Medical History  Diagnosis Date  . Atrial fibrillation   . Hypertension   . Hypercholesterolemia   . Seasonal allergies   . Acid reflux   . CHF (congestive heart failure)     Hx of diastolic CHF.  Marland Kitchen Gout   . Diverticular hemorrhage     2011  . Thrombocytopenia due to drugs     Allopurinol  . CKD (chronic kidney disease) stage 3, GFR 30-59 ml/min   . Hyperglycemia     Query DM 2   Past Surgical History:  Past Surgical History  Procedure Date  . Cesarean section   . Cataract extraction   . Joint replacement     Total right knee 2007  . Appendectomy   . Tubal ligation     Precautions/Restrictions  Precautions Precautions: Fall Required Braces or Orthoses: No Restrictions Weight Bearing Restrictions: No Other Position/Activity Restrictions: weight bearing is functionally limited by foot pain Prior Functioning  Home Living Type of Home: House Lives With: Family Receives Help From: Family Home Layout: One level Home Access: Stairs to enter Entrance Stairs-Rails: None Technical brewer of Steps: 1 Bathroom Shower/Tub: Chiropodist: Standard Bathroom Accessibility: Yes How Accessible: Accessible via walker Home Adaptive Equipment: Walker - rolling Prior Function Level of Independence: Independent with basic ADLs;Independent with homemaking with  ambulation;Independent with gait;Independent with transfers;Requires assistive device for independence Driving: No Vocation: Retired Artist: Awake/alert Overall Cognitive Status: Appears within functional limits for tasks assessed Orientation Level: Oriented X4 Sensation/Coordination Sensation Light Touch: Appears Intact Extremity Assessment RUE Assessment RUE Assessment: Within Functional Limits LUE Assessment LUE Assessment: Within Functional Limits RLE Assessment RLE Assessment:  (deconditioned) LLE Assessment LLE Assessment:  (deconditioned) Mobility (including Balance) Bed Mobility Bed Mobility: Yes Right Sidelying to Sit: 2: Max assist Transfers Transfers: Yes Stand Pivot Transfers: 2: Max assist Ambulation/Gait Ambulation/Gait: No Stairs: No Wheelchair Mobility Wheelchair Mobility: No    Exercise     End of Session PT - End of Session Equipment Utilized During Treatment: Gait belt Activity Tolerance: Patient tolerated treatment well Patient left: in chair;with call bell in reach Nurse Communication: Mobility status for transfers General Behavior During Session: [facility] for tasks performed Cognition: [facility] for tasks performed PT Assessment/Plan/Recommendation PT Assessment Clinical Impression Statement: very pleasant pt who has acute onset of gout in LE's (R>L) as well as deconditioning due to recent surgery-will need SNF at d/c  PT Recommendation/Assessment: Patient will need skilled PT in the acute care venue PT Problem List: Decreased strength;Decreased activity tolerance;Decreased mobility;Pain PT Therapy Diagnosis : Difficulty walking;Acute pain;Generalized weakness PT Recommendation Follow Up Recommendations: Skilled nursing facility Equipment Recommended: Defer to next venue PT Goals  Acute Rehab PT Goals PT Goal Formulation: With patient Pt will Transfer Sit to Stand/Stand to Sit: with min assist Pt will Transfer Bed to  Chair/Chair to Bed: with mod assist Pt will Stand:  with mod assist [name] 09/26/2010, 12:27 PM

## 2010-09-26 NOTE — Progress Notes (Signed)
ANTIBIOTIC CONSULT NOTE - INITIAL  Pharmacy Consult for Vancomycin Indication: Leukocytosis  Patient Measurements: Height: 5\' 5"  (165.1 cm) Weight: 179 lb 10.8 oz (81.5 kg) IBW/kg (Calculated) : 57   Vital Signs: Temp: 97.3 F (36.3 C) (08/21 1741) Temp src: Oral (08/21 1741) Intake/Output from previous day: 08/20 0701 - 08/21 0700 In: -  Out: 225 [Urine:225] Labs:  Basename 09/26/10 0510 09/25/10 0456 09/24/10 0519  WBC 17.4* 18.7* 19.7*  HGB 10.3* 10.3* 10.5*  PLT 216 199 187  LABCREA -- -- --  CREATININE 1.05 -- 0.96  CRCLEARANCE -- -- --   Microbiology: Recent Results (from the past 720 hour(s))  CULTURE, BLOOD (ROUTINE X 2)     Status: Normal   Collection Time   09/16/10  1:30 PM      Component Value Range Status Comment   Specimen Description BLOOD LEFT ANTECUBITAL   Final    Special Requests     Final    Value: BOTTLES DRAWN AEROBIC AND ANAEROBIC 6CC EACH BOTTLE   Culture NO GROWTH 5 DAYS   Final    Report Status 09/21/2010 FINAL   Final   CULTURE, BLOOD (ROUTINE X 2)     Status: Normal   Collection Time   09/16/10  1:47 PM      Component Value Range Status Comment   Specimen Description BLOOD LEFT HAND   Final    Special Requests     Final    Value: BOTTLES DRAWN AEROBIC AND ANAEROBIC 6CC EACH BOTTLE   Culture NO GROWTH 5 DAYS   Final    Report Status 09/21/2010 FINAL   Final   URINE CULTURE     Status: Normal   Collection Time   09/16/10  7:18 PM      Component Value Range Status Comment   Specimen Description URINE, CATHETERIZED   Final    Special Requests NONE   Final    Setup Time [id number]   Final    Colony Count NO GROWTH   Final    Culture NO GROWTH   Final    Report Status 09/18/2010 FINAL   Final   MRSA PCR SCREENING     Status: Normal   Collection Time   09/16/10 10:45 PM      Component Value Range Status Comment   MRSA by PCR NEGATIVE  NEGATIVE  Final   PATHOLOGIST SMEAR REVIEW     Status: Normal   Collection Time   09/19/10 10:30 AM        Component Value Range Status Comment   Tech Review     Final    Value: THROMBOCYTOPENIA WITH NORMOCYTIC ANEMIA. NO EVIDENCE OF LEUKEMIA. 09/19/10    Medical History: Past Medical History  Diagnosis Date  . Atrial fibrillation   . Hypertension   . Hypercholesterolemia   . Seasonal allergies   . Acid reflux   . CHF (congestive heart failure)     Hx of diastolic CHF.  Marland Kitchen Gout   . Diverticular hemorrhage     2011  . Thrombocytopenia due to drugs     Allopurinol  . CKD (chronic kidney disease) stage 3, GFR 30-59 ml/min   . Hyperglycemia     Query DM 2    Medications:  Anti-infectives     Start     Dose/Rate Route Frequency Ordered Stop   09/24/10 1800   fluconazole (DIFLUCAN) IVPB 200 mg        200 mg 100 mL/hr over 60 Minutes Intravenous  Once 09/24/10 1747 09/24/10 1959   09/22/10 1430   ceFAZolin (ANCEF) IVPB 1 g/50 mL premix        1 g 100 mL/hr over 30 Minutes Intravenous  Once 09/22/10 1422 09/22/10 1500   09/22/10 1429   ceFAZolin (ANCEF) 1 G injection     Comments: DANIEL, KAREN: cabinet override         09/22/10 1429 09/22/10 1432   09/22/10 0930   cefOXitin (MEFOXIN) 1 g in dextrose 5 % 50 mL IVPB        1 g 100 mL/hr over 30 Minutes Intravenous 60 min pre-op 09/21/10 2019 09/22/10 1042         Assessment: Okay for Protocol  Goal of Therapy:  Trough 10-15.  Plan:  Measure antibiotic drug levels at steady state Vancomycin 1000mg  IV x 1 tonight, then 1250mg  IV every 24 hours.  [name] [name] 09/26/2010,7:41 PM

## 2010-09-26 NOTE — Progress Notes (Signed)
Subjective: No complaints of abdominal pain nausea or vomiting. She passed a lot of gas last night. Her gout pain is not as bad today.  Objective: Vital signs in last 24 hours: Filed Vitals:   09/25/10 2030 09/26/10 0130 09/26/10 1200 09/26/10 1741  BP: 164/77 155/84    Pulse: 72 74    Temp: 97.7 F (36.5 C) 97.7 F (36.5 C) 97.9 F (36.6 C) 97.3 F (36.3 C)  TempSrc: Oral Oral Oral Oral  Resp:  16    Height:      Weight:      SpO2:  99%      Intake/Output Summary (Last 24 hours) at 09/26/10 1923 Last data filed at 09/26/10 1800  Gross per 24 hour  Intake   2100 ml  Output    576 ml  Net   1524 ml     Weight change:   General appearance: Comfortable Resp: clear to auscultation bilaterally, without wheezes rhonchi or rales Cardio: irregularly irregular rhythm, with tachycardia. GI: soft, nondistended;  bowel sounds present; mild diffuse tenderness; bandage in place-not removed. Extremities: No tenderness or warmth in her feet; trace pedal edema.   Lab Results: Basic Metabolic Panel:  Basename 09/26/10 0510 09/24/10 0519  NA 141 140  K 4.4 3.5  CL 111 105  CO2 21 21  GLUCOSE 153* 102*  BUN 36* 20  CREATININE 1.05 0.96  CALCIUM 8.1* 7.6*  MG -- --  PHOS -- --   Liver Function Tests:  [facility] 09/24/10 0519  AST 15  ALT 12  ALKPHOS 82  BILITOT 1.8*  PROT 5.6*  ALBUMIN 2.1*   No results found for this basename: LIPASE:2,AMYLASE:2 in the last 72 hours CBC:  Basename 09/26/10 0510 09/25/10 0456  WBC 17.4* 18.7*  NEUTROABS -- --  HGB 10.3* 10.3*  HCT 31.1* 30.6*  MCV 85.7 84.8  PLT 216 199   Cardiac Enzymes: No results found for this basename: CKTOTAL:3,CKMB:3,CKMBINDEX:3,TROPONINI:3 in the last 72 hours BNP: No results found for this basename: POCBNP:3 in the last 72 hours D-Dimer: No results found for this basename: DDIMER:2 in the last 72 hours CBG:  Basename 09/26/10 1612 09/26/10 1140 09/26/10 0735 09/26/10 0359 09/25/10 2015 09/25/10  1623  GLUCAP 153* 110* 120* 162* 187* 181*   Hemoglobin A1C: No results found for this basename: HGBA1C in the last 72 hours Fasting Lipid Panel: No results found for this basename: CHOL,HDL,LDLCALC,TRIG,CHOLHDL,LDLDIRECT in the last 72 hours Thyroid Function Tests: No results found for this basename: TSH,T4TOTAL,FREET4,T3FREE,THYROIDAB in the last 72 hours Anemia Panel: No results found for this basename: VITAMINB12,FOLATE,FERRITIN,TIBC,IRON,RETICCTPCT in the last 72 hours T. Jackson. Medications: I have reviewed the patient's current medications.  Assessment/Plan: Principal Problem:  *SBO (small bowel obstruction) status post exploratory laparotomy and lysis of adhesion. Doing well postoperatively. Her bowel function is improving. Her diet was advanced to a full liquid diet by Dr. Geroge Baseman.   Chronic a-fib: Remains on Cardizem gtt, IV Metoprolol, and IV Digoxin, which was started. Will continue these medications IV. Will transition Cardizem to by mouth and metoprolol to by mouth. We'll discontinue digoxin altogether.  Leukocytosis: Post-op. May also be sec to recent steroids for gout. We will add vancomycin empirically given the recent central line.   DM type 2 (diabetes mellitus, type 2): Continue current Tx.   Gout attack: S/p 1 dose of Toradol. Given Solumedrol on several occasions. Will start colchicine.   ARF (acute renal failure): Resolved.   CKD (chronic kidney disease)  Anemia: Stable  Borderline abnormal TFTs: Repeat outpatient thyroid function tests once acute illness has resolved. Not entirely consistent with hyperthyroidism. Could be euthyroid sick syndrome.  Warfarin anticoagulation: Resume warfarin once okay with surgery. PT consult Candidal vulvovaginitis: Diflucan given.   LOS: 10 days   Cola Gane 09/26/2010, 7:23 PM

## 2010-09-27 LAB — GLUCOSE, CAPILLARY
Glucose-Capillary: 99 mg/dL (ref 70–99)
Glucose-Capillary: 93 mg/dL (ref 70–99)
Glucose-Capillary: 111 mg/dL — ABNORMAL HIGH (ref 70–99)
Glucose-Capillary: 110 mg/dL — ABNORMAL HIGH (ref 70–99)

## 2010-09-27 LAB — CBC
HCT: 27 % — ABNORMAL LOW (ref 36.0–46.0)
Hemoglobin: 8.9 g/dL — ABNORMAL LOW (ref 12.0–15.0)
MCH: 28.2 pg (ref 26.0–34.0)
MCHC: 33 g/dL (ref 30.0–36.0)
MCV: 85.4 fL (ref 78.0–100.0)
Platelets: 170 10*3/uL (ref 150–400)
RBC: 3.16 MIL/uL — ABNORMAL LOW (ref 3.87–5.11)
RDW: 15.4 % (ref 11.5–15.5)
WBC: 7.8 10*3/uL (ref 4.0–10.5)

## 2010-09-27 LAB — BASIC METABOLIC PANEL
BUN: 20 mg/dL (ref 6–23)
CO2: 23 mEq/L (ref 19–32)
Calcium: 7.4 mg/dL — ABNORMAL LOW (ref 8.4–10.5)
Chloride: 111 mEq/L (ref 96–112)
Creatinine, Ser: 0.64 mg/dL (ref 0.50–1.10)
GFR calc Af Amer: >60 mL/min (ref >60)
GFR calc non Af Amer: >60 mL/min (ref >60)
Glucose, Bld: 93 mg/dL (ref 70–99)
Potassium: 4.2 mEq/L (ref 3.5–5.1)
Sodium: 140 mEq/L (ref 135–145)

## 2010-09-27 MED ORDER — ENOXAPARIN SODIUM 40 MG/0.4ML ~~LOC~~ SOLN
40.0000 mg | SUBCUTANEOUS | Status: DC
  Administered 2010-09-28: 40 mg via SUBCUTANEOUS
  Filled 2010-09-27: qty 0.4

## 2010-09-27 MED ORDER — POTASSIUM CHLORIDE IN NACL 20-0.9 MEQ/L-% IV SOLN
INTRAVENOUS | Status: DC
  Administered 2010-09-27 (×2): via INTRAVENOUS

## 2010-09-27 MED ORDER — WARFARIN SODIUM 2 MG PO TABS
4.0000 mg | ORAL_TABLET | Freq: Every day | ORAL | Status: DC
  Administered 2010-09-27 – 2010-09-28 (×2): 4 mg via ORAL
  Filled 2010-09-27: qty 1
  Filled 2010-09-27: qty 2
  Filled 2010-09-27: qty 1

## 2010-09-27 MED ORDER — SODIUM CHLORIDE 0.9 % IJ SOLN
INTRAMUSCULAR | Status: AC
  Administered 2010-09-27: 10 mL
  Filled 2010-09-27: qty 10

## 2010-09-27 MED ORDER — SODIUM CHLORIDE 0.9 % IJ SOLN
INTRAMUSCULAR | Status: AC
  Administered 2010-09-27: 07:00:00
  Filled 2010-09-27: qty 10

## 2010-09-27 MED ORDER — VANCOMYCIN HCL 1000 MG IV SOLR
750.0000 mg | Freq: Two times a day (BID) | INTRAVENOUS | Status: DC
  Administered 2010-09-27 – 2010-09-28 (×3): 750 mg via INTRAVENOUS
  Filled 2010-09-27 (×5): qty 750

## 2010-09-27 MED ORDER — ENOXAPARIN SODIUM 40 MG/0.4ML ~~LOC~~ SOLN: 40.0000 mg | Freq: Two times a day (BID) | SUBCUTANEOUS | Status: DC

## 2010-09-27 NOTE — Progress Notes (Addendum)
Subjective: She denies abdominal pain, nausea, or vomiting. Her appetite is not its best.  Objective: Vital signs in last 24 hours: Filed Vitals:   09/26/10 1741 09/26/10 2229 09/27/10 0535 09/27/10 1115  BP:  150/86 144/80   Pulse:  98 97   Temp: 97.3 F (36.3 C) 97.4 F (36.3 C) 98.3 F (36.8 C)   TempSrc: Oral Oral Oral   Resp:  18 18   Height:      Weight:      SpO2:  99% 98% 98%    Intake/Output Summary (Last 24 hours) at 09/27/10 1357 Last data filed at 09/26/10 1800  Gross per 24 hour  Intake   1500 ml  Output      0 ml  Net   1500 ml     Weight change:   General appearance: Comfortable Resp: clear to auscultation bilaterally, without wheezes rhonchi or rales Cardio: irregularly irregular rhythm, with tachycardia. GI: soft, nondistended;  bowel sounds present; mild diffuse tenderness; staples in place without erythema or drainage. Extremities: Trace of pedal edema bilaterally. No acute hot red joints.   Lab Results: Basic Metabolic Panel:  Basename 09/27/10 0442 09/26/10 0510  NA 140 141  K 4.2 4.4  CL 111 111  CO2 23 21  GLUCOSE 93 153*  BUN 20 36*  CREATININE 0.64 1.05  CALCIUM 7.4* 8.1*  MG -- --  PHOS -- --   Liver Function Tests: No results found for this basename: AST:2,ALT:2,ALKPHOS:2,BILITOT:2,PROT:2,ALBUMIN:2 in the last 72 hours No results found for this basename: LIPASE:2,AMYLASE:2 in the last 72 hours CBC:  Basename 09/27/10 0442 09/26/10 0510  WBC 7.8 17.4*  NEUTROABS -- --  HGB 8.9* 10.3*  HCT 27.0* 31.1*  MCV 85.4 85.7  PLT 170 216   Cardiac Enzymes: No results found for this basename: CKTOTAL:3,CKMB:3,CKMBINDEX:3,TROPONINI:3 in the last 72 hours BNP: No results found for this basename: POCBNP:3 in the last 72 hours D-Dimer: No results found for this basename: DDIMER:2 in the last 72 hours CBG:  Basename 09/27/10 1113 09/27/10 0715 09/26/10 2004 09/26/10 1612 09/26/10 1140 09/26/10 0735  GLUCAP 93 99 123* 153* 110* 120*     Hemoglobin A1C: No results found for this basename: HGBA1C in the last 72 hours Fasting Lipid Panel: No results found for this basename: CHOL,HDL,LDLCALC,TRIG,CHOLHDL,LDLDIRECT in the last 72 hours Thyroid Function Tests: No results found for this basename: TSH,T4TOTAL,FREET4,T3FREE,THYROIDAB in the last 72 hours Anemia Panel: No results found for this basename: VITAMINB12,FOLATE,FERRITIN,TIBC,IRON,RETICCTPCT in the last 72 hours T. Jackson. Medications: I have reviewed the patient's current medications.  Assessment/Plan:   SBO (small bowel obstruction) status post exploratory laparotomy and lysis of adhesion. Doing well postoperatively. Her bowel function is improving. Her diet is being advanced by Dr. Geroge Baseman.   Chronic a-fib: Her rate is now well controlled. She is back on her oral medications. The dose of Coreg will be increased to 25 mg twice a day. Coumadin will be restarted today. We will discontinue full dose Lovenox and transitioned her to prophylactic Lovenox.  Hypertension: Her blood pressure is trending up. We will increase the dose of Coreg as stated above and continue diltiazem at its current dose. We will continue to hold valsartan/HCTZ for now.  Leukocytosis: Her white blood cell count is now within normal limits.  Post-op. May also be sec to recent steroids for gout. Vancomycin added. Will consider discontinuing vancomycin tomorrow.  Thrombocytopenia:  Resolved.   DM type 2 (diabetes mellitus, type 2): Continue current treatment. Reasonable control.  Gout attack: S/p 1 dose of Toradol. Given Solumedrol on several occasions. Once daily colchicine has been restarted.    ARF (acute renal failure): Resolved.   CKD (chronic kidney disease) Her renal function has returned to baseline.  Anemia: Her hemoglobin is trending down a little, will monitor.  Borderline abnormal TFTs: Repeat outpatient thyroid function tests once acute illness has resolved. Not entirely consistent  with hyperthyroidism. Could be euthyroid sick syndrome. Candidal vulvovaginitis: Diflucan given. Deconditioning/debility: According to the physical therapist, she will need short-term rehabilitation.   LOS: 11 days   Darlis Wragg 09/27/2010, 1:57 PM

## 2010-09-27 NOTE — Progress Notes (Signed)
ANTIBIOTIC CONSULT NOTE   Pharmacy Consult for Vancomycin Indication: Leukocytosis  Patient Measurements: Height: 5\' 5"  (165.1 cm) Weight: 179 lb 10.8 oz (81.5 kg) IBW/kg (Calculated) : 57   Vital Signs: Temp: 98.3 F (36.8 C) (08/22 0535) Temp src: Oral (08/22 0535) BP: 144/80 mmHg (08/22 0535) Pulse Rate: 97  (08/22 0535) Intake/Output from previous day: 08/21 0701 - 08/22 0700 In: 2100 [I.V.:2100] Out: 351 [Urine:350; Stool:1] Labs:  [facility] 09/27/10 0442 09/26/10 0510 09/25/10 0456  WBC 7.8 17.4* 18.7*  HGB 8.9* 10.3* 10.3*  PLT 170 216 199  LABCREA -- -- --  CREATININE 0.64 1.05 --  CRCLEARANCE -- -- --   Microbiology: Recent Results (from the past 720 hour(s))  CULTURE, BLOOD (ROUTINE X 2)     Status: Normal   Collection Time   09/16/10  1:30 PM      Component Value Range Status Comment   Specimen Description BLOOD LEFT ANTECUBITAL   Final    Special Requests     Final    Value: BOTTLES DRAWN AEROBIC AND ANAEROBIC 6CC EACH BOTTLE   Culture NO GROWTH 5 DAYS   Final    Report Status 09/21/2010 FINAL   Final   CULTURE, BLOOD (ROUTINE X 2)     Status: Normal   Collection Time   09/16/10  1:47 PM      Component Value Range Status Comment   Specimen Description BLOOD LEFT HAND   Final    Special Requests     Final    Value: BOTTLES DRAWN AEROBIC AND ANAEROBIC 6CC EACH BOTTLE   Culture NO GROWTH 5 DAYS   Final    Report Status 09/21/2010 FINAL   Final   URINE CULTURE     Status: Normal   Collection Time   09/16/10  7:18 PM      Component Value Range Status Comment   Specimen Description URINE, CATHETERIZED   Final    Special Requests NONE   Final    Setup Time [id number]   Final    Colony Count NO GROWTH   Final    Culture NO GROWTH   Final    Report Status 09/18/2010 FINAL   Final   MRSA PCR SCREENING     Status: Normal   Collection Time   09/16/10 10:45 PM      Component Value Range Status Comment   MRSA by PCR NEGATIVE  NEGATIVE  Final   PATHOLOGIST  SMEAR REVIEW     Status: Normal   Collection Time   09/19/10 10:30 AM      Component Value Range Status Comment   Tech Review     Final    Value: THROMBOCYTOPENIA WITH NORMOCYTIC ANEMIA. NO EVIDENCE OF LEUKEMIA. 09/19/10    Medical History: Past Medical History  Diagnosis Date  . Atrial fibrillation   . Hypertension   . Hypercholesterolemia   . Seasonal allergies   . Acid reflux   . CHF (congestive heart failure)     Hx of diastolic CHF.  Marland Kitchen Gout   . Diverticular hemorrhage     2011  . Thrombocytopenia due to drugs     Allopurinol  . CKD (chronic kidney disease) stage 3, GFR 30-59 ml/min   . Hyperglycemia     Query DM 2   Assessment: Scr improved (ClCr > 60 ml/min) Renal fxn improved  Goal of Therapy:  Trough 10-15.  Plan:  Measure antibiotic drug levels at steady state Vancomycin 750mg  iv q12hrs and check trough  on Friday morning. Monitor SCr/renal fxn  [name], Shannon Kirkendall A 09/27/2010,10:12 AM

## 2010-09-27 NOTE — Progress Notes (Signed)
09/27/2010 [name] Lipps rn [facility] spoke to pt and her daughter concerning d/c needs. Pt is recommending short term snf. Ms Tout hope she can ambulate better now that she in on her gout medication for her knees and be able to go home with hh. CM will reassess tomorrow  And assist as needed with d/c planning.

## 2010-09-27 NOTE — Consult Note (Signed)
ANTICOAGULATION CONSULT NOTE - Initial Consult  Pharmacy Consult for warfarin Indication: atrial fibrillation  Patient Measurements: Height: 5\' 5"  (165.1 cm) Weight: 179 lb 10.8 oz (81.5 kg) IBW/kg (Calculated) : 57   Vital Signs: Temp: 98.3 F (36.8 C) (08/22 0535) Temp src: Oral (08/22 0535) BP: 144/80 mmHg (08/22 0535) Pulse Rate: 97  (08/22 0535)  Labs: INR Last Three Days: No results found for this basename: INR:3 in the last 72 hours   Basename 09/27/10 0442 09/26/10 0510 09/25/10 0456  HGB 8.9* 10.3* --  HCT 27.0* 31.1* 30.6*  PLT 170 216 199  APTT -- -- --  LABPROT -- -- --  INR -- -- --  HEPARINUNFRC -- -- --  CREATININE 0.64 1.05 --  CRCLEARANCE -- -- --  CKTOTAL -- -- --  CKMB -- -- --  TROPONINI -- -- --    Medical History: Past Medical History  Diagnosis Date  . Atrial fibrillation   . Hypertension   . Hypercholesterolemia   . Seasonal allergies   . Acid reflux   . CHF (congestive heart failure)     Hx of diastolic CHF.  Marland Kitchen Gout   . Diverticular hemorrhage     2011  . Thrombocytopenia due to drugs     Allopurinol  . CKD (chronic kidney disease) stage 3, GFR 30-59 ml/min   . Hyperglycemia     Query DM 2   Medications:  Scheduled:    . carvedilol  12.5 mg Oral BID WC  . colchicine  0.6 mg Oral Daily  . diltiazem  120 mg Oral Daily  . enoxaparin  40 mg Subcutaneous Q24H  . insulin aspart  0-9 Units Subcutaneous Q4H  . pantoprazole (PROTONIX) IV  40 mg Intravenous Q24H  . sodium chloride  10 mL Intracatheter Q12H  . sodium chloride      . vancomycin  750 mg Intravenous Q12H  . vancomycin  1,000 mg Intravenous Once  . warfarin  4 mg Oral Daily  . DISCONTD: carvedilol  12.5 mg Oral BID WC  . DISCONTD: digoxin  0.125 mg Intravenous Daily  . DISCONTD: enoxaparin  40 mg Subcutaneous Q12H  . DISCONTD: enoxaparin  1 mg/kg Subcutaneous Q12H  . DISCONTD: insulin aspart  0-9 Units Subcutaneous Q4H  . DISCONTD: metoprolol  5 mg Intravenous Q4H    . DISCONTD: vancomycin  1,250 mg Intravenous Q24H    Assessment: MD resumed Coumadin this am with 4mg  daily (already given by nursing)  Will change times to 1800 hrs MD resumed Lovenox 40mg  sq q24hrs for VTE prophylaxis until INR therapeutic.  Goal of Therapy:  INR 2-3   Plan: Coumadin 4mg  daily for now INR daily until stable. Lovenox 40mg  sq daily until therapeutic INR achieved   Hart Robinsons A 09/27/2010,2:13 PM

## 2010-09-27 NOTE — Progress Notes (Signed)
5 Days Post-Op  Subjective: Feels better.  No issues with PO.  Continued BM without issues.  Objective: Vital signs in last 24 hours: Temp:  [97.3 F (36.3 C)-98.3 F (36.8 C)] 98.3 F (36.8 C) (08/22 0535) Pulse Rate:  [97-98] 97  (08/22 0535) Resp:  [18] 18  (08/22 0535) BP: (144-150)/(80-86) 144/80 mmHg (08/22 0535) SpO2:  [98 %-99 %] 98 % (08/22 0535) Last BM Date: 09/24/10  Intake/Output from previous day: 08/21 0701 - 08/22 0700 In: 2100 [I.V.:2100] Out: 351 [Urine:350; Stool:1] Intake/Output this shift:    General appearance: alert and no distress GI: soft, non-distended.  Inc c/d/i.  Minimal tenderness.  Lab Results:  @LABLAST2 (wbc:2,hgb:2,hct:2,plt:2) BMET  Basename 09/27/10 0442 09/26/10 0510  NA 140 141  K 4.2 4.4  CL 111 111  CO2 23 21  GLUCOSE 93 153*  BUN 20 36*  CREATININE 0.64 1.05  CALCIUM 7.4* 8.1*   PT/INR No results found for this basename: LABPROT:2,INR:2 in the last 72 hours ABG No results found for this basename: PHART:2,PCO2:2,PO2:2,HCO3:2 in the last 72 hours  Studies/Results: No results found.  Anti-infectives: Anti-infectives     Start     Dose/Rate Route Frequency Ordered Stop   09/27/10 2000   vancomycin (VANCOCIN) 1,250 mg in sodium chloride 0.9 % 250 mL IVPB  Status:  Discontinued        1,250 mg 166.7 mL/hr over 90 Minutes Intravenous Every 24 hours 09/26/10 1947 09/27/10 1009   09/26/10 2000   vancomycin (VANCOCIN) IVPB 1000 mg/200 mL premix        1,000 mg 200 mL/hr over 60 Minutes Intravenous  Once 09/26/10 1947 09/26/10 2336   09/24/10 1800   fluconazole (DIFLUCAN) IVPB 200 mg        200 mg 100 mL/hr over 60 Minutes Intravenous  Once 09/24/10 1747 09/24/10 1959   09/22/10 1430   ceFAZolin (ANCEF) IVPB 1 g/50 mL premix        1 g 100 mL/hr over 30 Minutes Intravenous  Once 09/22/10 1422 09/22/10 1500   09/22/10 1429   ceFAZolin (ANCEF) 1 G injection     Comments: DANIEL, KAREN: cabinet override         09/22/10  1429 09/22/10 1432   09/22/10 0930   cefOXitin (MEFOXIN) 1 g in dextrose 5 % 50 mL IVPB        1 g 100 mL/hr over 30 Minutes Intravenous 60 min pre-op 09/21/10 2019 09/22/10 1042          Assessment/Plan: s/p Procedure(s): EXPLORATORY LAPAROTOMY Doing well.  Increase activity.  Increase diet.  Resume coumadin.  D/c soon.  LOS: 11 days    Gerardo Territo C 09/27/2010

## 2010-09-28 ENCOUNTER — Encounter (HOSPITAL_COMMUNITY): Payer: Self-pay | Admitting: General Surgery

## 2010-09-28 LAB — CBC
HCT: 24.8 % — ABNORMAL LOW (ref 36.0–46.0)
Hemoglobin: 8.1 g/dL — ABNORMAL LOW (ref 12.0–15.0)
MCH: 27.6 pg (ref 26.0–34.0)
MCHC: 32.7 g/dL (ref 30.0–36.0)
MCV: 84.6 fL (ref 78.0–100.0)
Platelets: 164 10*3/uL (ref 150–400)
RBC: 2.93 MIL/uL — ABNORMAL LOW (ref 3.87–5.11)
RDW: 15.6 % — ABNORMAL HIGH (ref 11.5–15.5)
WBC: 6.7 10*3/uL (ref 4.0–10.5)

## 2010-09-28 LAB — GLUCOSE, CAPILLARY
Glucose-Capillary: 113 mg/dL — ABNORMAL HIGH (ref 70–99)
Glucose-Capillary: 116 mg/dL — ABNORMAL HIGH (ref 70–99)
Glucose-Capillary: 79 mg/dL (ref 70–99)
Glucose-Capillary: 86 mg/dL (ref 70–99)
Glucose-Capillary: 95 mg/dL (ref 70–99)

## 2010-09-28 LAB — BASIC METABOLIC PANEL
BUN: 13 mg/dL (ref 6–23)
CO2: 23 mEq/L (ref 19–32)
Calcium: 7.3 mg/dL — ABNORMAL LOW (ref 8.4–10.5)
Chloride: 108 mEq/L (ref 96–112)
Creatinine, Ser: 0.53 mg/dL (ref 0.50–1.10)
GFR calc Af Amer: >60 mL/min (ref >60)
GFR calc non Af Amer: >60 mL/min (ref >60)
Glucose, Bld: 79 mg/dL (ref 70–99)
Potassium: 3.8 mEq/L (ref 3.5–5.1)
Sodium: 137 mEq/L (ref 135–145)

## 2010-09-28 LAB — PROTIME-INR
INR: 1.25 (ref 0.00–1.49)
Prothrombin Time: 16 seconds — ABNORMAL HIGH (ref 11.6–15.2)

## 2010-09-28 MED ORDER — CALCIUM CARBONATE-VITAMIN D 500-200 MG-UNIT PO TABS
1.0000 | ORAL_TABLET | Freq: Every day | ORAL | Status: DC
  Administered 2010-09-28 – 2010-10-01 (×4): 1 via ORAL
  Filled 2010-09-28 (×4): qty 1

## 2010-09-28 MED ORDER — WARFARIN SODIUM 2 MG PO TABS
4.0000 mg | ORAL_TABLET | Freq: Every day | ORAL | Status: DC
  Administered 2010-09-29 – 2010-09-30 (×2): 4 mg via ORAL
  Filled 2010-09-28: qty 1
  Filled 2010-09-28: qty 2
  Filled 2010-09-28: qty 1

## 2010-09-28 MED ORDER — PANTOPRAZOLE SODIUM 40 MG PO TBEC
40.0000 mg | DELAYED_RELEASE_TABLET | Freq: Every day | ORAL | Status: DC
  Administered 2010-09-29 – 2010-10-01 (×3): 40 mg via ORAL
  Filled 2010-09-28 (×3): qty 1

## 2010-09-28 MED ORDER — OLMESARTAN MEDOXOMIL 20 MG PO TABS
20.0000 mg | ORAL_TABLET | Freq: Every day | ORAL | Status: DC
  Administered 2010-09-28 – 2010-10-01 (×4): 20 mg via ORAL
  Filled 2010-09-28 (×4): qty 1

## 2010-09-28 MED ORDER — HYDROCHLOROTHIAZIDE 12.5 MG PO CAPS
12.5000 mg | ORAL_CAPSULE | Freq: Every day | ORAL | Status: DC
  Administered 2010-09-28 – 2010-10-01 (×4): 12.5 mg via ORAL
  Filled 2010-09-28 (×3): qty 1

## 2010-09-28 NOTE — Progress Notes (Signed)
8/23 spoke with pt and daughters concerning d/c needs. Physicial therapy  is recommending snf but pt states she is going home. PT and daughter feel they will be able to manage with [facility] RN and PT. They states that one of the daughters is planning to take time off from work to stay with her and other family members will help  CM to arrange for hospital bed and wheelchair as requested.

## 2010-09-28 NOTE — Progress Notes (Signed)
6 Days Post-Op  Subjective: Feels good.  No abdominal pain.  Only feet hurting.  Tolerating PO.   Objective: Vital signs in last 24 hours: Temp:  [98 F (36.7 C)-98.9 F (37.2 C)] 98 F (36.7 C) (08/23 1400) Pulse Rate:  [82-90] 87  (08/23 1400) Resp:  [16-20] 18  (08/23 1400) BP: (137-175)/(68-79) 137/78 mmHg (08/23 1400) SpO2:  [97 %-98 %] 98 % (08/23 1400) Last BM Date: 09/27/10  Intake/Output from previous day: 08/22 0701 - 08/23 0700 In: 4100 [P.O.:600; I.V.:3500] Out: 201 [Urine:200; Stool:1] Intake/Output this shift:    General appearance: alert and no distress GI: Soft, NT, ND, Inc c/d/i  Lab Results:  @LABLAST2 (wbc:2,hgb:2,hct:2,plt:2) BMET  Basename 09/28/10 0507 09/27/10 0442  NA 137 140  K 3.8 4.2  CL 108 111  CO2 23 23  GLUCOSE 79 93  BUN 13 20  CREATININE 0.53 0.64  CALCIUM 7.3* 7.4*   PT/INR  Basename 09/28/10 1150  LABPROT 16.0*  INR 1.25   ABG No results found for this basename: PHART:2,PCO2:2,PO2:2,HCO3:2 in the last 72 hours  Studies/Results: No results found.  Anti-infectives: Anti-infectives     Start     Dose/Rate Route Frequency Ordered Stop   09/27/10 2000   vancomycin (VANCOCIN) 1,250 mg in sodium chloride 0.9 % 250 mL IVPB  Status:  Discontinued        1,250 mg 166.7 mL/hr over 90 Minutes Intravenous Every 24 hours 09/26/10 1947 09/27/10 1009   09/27/10 1100   vancomycin (VANCOCIN) 750 mg in sodium chloride 0.9 % 150 mL IVPB  Status:  Discontinued        750 mg 150 mL/hr over 60 Minutes Intravenous Every 12 hours 09/27/10 1010 09/28/10 1304   09/26/10 2000   vancomycin (VANCOCIN) IVPB 1000 mg/200 mL premix        1,000 mg 200 mL/hr over 60 Minutes Intravenous  Once 09/26/10 1947 09/26/10 2336   09/24/10 1800   fluconazole (DIFLUCAN) IVPB 200 mg        200 mg 100 mL/hr over 60 Minutes Intravenous  Once 09/24/10 1747 09/24/10 1959   09/22/10 1430   ceFAZolin (ANCEF) IVPB 1 g/50 mL premix        1 g 100 mL/hr over 30  Minutes Intravenous  Once 09/22/10 1422 09/22/10 1500   09/22/10 1429   ceFAZolin (ANCEF) 1 G injection     Comments: DANIEL, KAREN: cabinet override         09/22/10 1429 09/22/10 1432   09/22/10 0930   cefOXitin (MEFOXIN) 1 g in dextrose 5 % 50 mL IVPB        1 g 100 mL/hr over 30 Minutes Intravenous 60 min pre-op 09/21/10 2019 09/22/10 1042          Assessment/Plan: s/p Procedure(s): EXPLORATORY LAPAROTOMY Increase activity and diet as tolerated.  Will resume home meds.    LOS: 12 days    [name] [name] C 09/28/2010

## 2010-09-28 NOTE — Progress Notes (Signed)
Physical Therapy Treatment Patient Name: JHADA HILKE [id number] Date: 09/28/2010 Problem List:  Patient Active Problem List  Diagnoses  . Nausea and vomiting  . SBO (small bowel obstruction)  . ARF (acute renal failure)  . CKD (chronic kidney disease)  . Hypokalemia  . Chronic a-fib  . Warfarin anticoagulation  . Elevated troponin I level  . Hypotension  . Thrombocytopenia  . DM type 2 (diabetes mellitus, type 2)  . Hypomagnesemia  . Hypocalcemia  . Hypernatremia  . Gout attack  . Anemia  . Borderline abnormal TFTs  . Leukocytosis (leucocytosis)   Past Medical History:  Past Medical History  Diagnosis Date  . Atrial fibrillation   . Hypertension   . Hypercholesterolemia   . Seasonal allergies   . Acid reflux   . CHF (congestive heart failure)     Hx of diastolic CHF.  Marland Kitchen Gout   . Diverticular hemorrhage     2011  . Thrombocytopenia due to drugs     Allopurinol  . CKD (chronic kidney disease) stage 3, GFR 30-59 ml/min   . Hyperglycemia     Query DM 2   Past Surgical History:  Past Surgical History  Procedure Date  . Cesarean section   . Cataract extraction   . Joint replacement     Total right knee 2007  . Appendectomy   . Tubal ligation    Precautions/Restrictions  Precautions Precautions: Fall Required Braces or Orthoses: No Restrictions Weight Bearing Restrictions: No Other Position/Activity Restrictions: weight bearing is functionally limited by foot pain Mobility (including Balance)      Exercise     End of Session   PT Assessment/Plan   PT Goals  Acute Rehab PT Goals PT Transfer Goal: Sit to Stand/Stand to Sit - Progress: Progressing toward goal PT Goal: Stand - Progress: Met  [name] 09/28/2010, 8:18 AM

## 2010-09-28 NOTE — Consult Note (Addendum)
[name] [name] for warfarin Indication: atrial fibrillation  Patient Measurements: Height: 5\' 5"  (165.1 cm) Weight: 179 lb 10.8 oz (81.5 kg) IBW/kg (Calculated) : 57   Vital Signs: Temp: 98.2 F (36.8 C) (08/23 0613) BP: 175/79 mmHg (08/23 0613) Pulse Rate: 90  (08/23 0613)  Labs: INR Last Three Days: Recent Labs  Basename 09/28/10 1150   INR 1.25     Basename 09/28/10 1150 09/28/10 0507 09/27/10 0442 09/26/10 0510  HGB -- 8.1* 8.9* --  HCT -- 24.8* 27.0* 31.1*  PLT -- 164 170 216  APTT -- -- -- --  LABPROT 16.0* -- -- --  INR 1.25 -- -- --  HEPARINUNFRC -- -- -- --  CREATININE -- 0.53 0.64 1.05  CRCLEARANCE -- -- -- --  CKTOTAL -- -- -- --  CKMB -- -- -- --  TROPONINI -- -- -- --   Medications:  Scheduled:     . calcium-vitamin D  1 tablet Oral Daily  . carvedilol  12.5 mg Oral BID WC  . colchicine  0.6 mg Oral Daily  . diltiazem  120 mg Oral Daily  . enoxaparin  40 mg Subcutaneous Q24H  . hydrochlorothiazide  12.5 mg Oral Daily  . insulin aspart  0-9 Units Subcutaneous Q4H  . olmesartan  20 mg Oral Daily  . pantoprazole (PROTONIX) IV  40 mg Intravenous Q24H  . sodium chloride  10 mL Intracatheter Q12H  . vancomycin  750 mg Intravenous Q12H  . warfarin  4 mg Oral Daily  . DISCONTD: enoxaparin  40 mg Subcutaneous Q12H  . DISCONTD: enoxaparin  1 mg/kg Subcutaneous Q12H    Assessment: MD resumed Lovenox 40mg  sq q24hrs for VTE prophylaxis until INR therapeutic.  Goal of Therapy:  INR 2-3   Plan: Coumadin 4mg  daily for now INR daily until stable. Lovenox 40mg  sq daily until therapeutic INR achieved   The patient is receiving Protonix by the intravenous route.  Based on criteria approved by the Pharmacy and [place], the medication is being converted to the equivalent oral dose form.  These criteria include: -No Active GI bleeding -Able to tolerate diet of full liquids  (or better) or tube feeding -Able to tolerate other medications by the oral or enteral route  If you have any questions about this conversion, please contact the Pharmacy Department (ext [phone]).  Thank you.  [name] [name] 09/28/2010,12:56 PM

## 2010-09-28 NOTE — Consult Note (Signed)
CSW received referral for d/c planning. CSW and CM spoke with pt and she reports she plans to go home with home health.  Pt has plenty of family support to assist.  CSW to sign off.   [name] [name]

## 2010-09-28 NOTE — Progress Notes (Signed)
Subjective: She denies abdominal pain, nausea, or diarrhea. She had several loose bowel movements yesterday. She ate approximately 1/2 of breakfast.  Objective: Vital signs in last 24 hours: Filed Vitals:   09/27/10 1115 09/27/10 1500 09/27/10 2107 09/28/10 0613  BP:  149/73 137/68 175/79  Pulse:  79 82 90  Temp:  98.3 F (36.8 C) 98.9 F (37.2 C) 98.2 F (36.8 C)  TempSrc:  Oral    Resp:  20 16 20   Height:      Weight:      SpO2: 98% 97% 97% 98%    Intake/Output Summary (Last 24 hours) at 09/28/10 1117 Last data filed at 09/27/10 1800  Gross per 24 hour  Intake   3980 ml  Output    201 ml  Net   3779 ml     Weight change:   General appearance: Comfortable, lying in bed. Resp: clear to auscultation bilaterally, without wheezes rhonchi or rales Cardio: irregularly irregular rhythm. GI: soft, nondistended;  bowel sounds present; nontender; staples in place without erythema or drainage. Extremities: Trace of pedal edema bilaterally. No acute hot red joints.   Lab Results: Basic Metabolic Panel:  Basename 09/28/10 0507 09/27/10 0442  NA 137 140  K 3.8 4.2  CL 108 111  CO2 23 23  GLUCOSE 79 93  BUN 13 20  CREATININE 0.53 0.64  CALCIUM 7.3* 7.4*  MG -- --  PHOS -- --   Liver Function Tests: No results found for this basename: AST:2,ALT:2,ALKPHOS:2,BILITOT:2,PROT:2,ALBUMIN:2 in the last 72 hours No results found for this basename: LIPASE:2,AMYLASE:2 in the last 72 hours CBC:  Basename 09/28/10 0507 09/27/10 0442  WBC 6.7 7.8  NEUTROABS -- --  HGB 8.1* 8.9*  HCT 24.8* 27.0*  MCV 84.6 85.4  PLT 164 170   Cardiac Enzymes: No results found for this basename: CKTOTAL:3,CKMB:3,CKMBINDEX:3,TROPONINI:3 in the last 72 hours BNP: No results found for this basename: POCBNP:3 in the last 72 hours D-Dimer: No results found for this basename: DDIMER:2 in the last 72 hours CBG:  Basename 09/28/10 0623 09/27/10 2016 09/27/10 1626 09/27/10 1113 09/27/10 0715 09/26/10  2004  GLUCAP 79 110* 111* 93 99 123*   Hemoglobin A1C: No results found for this basename: HGBA1C in the last 72 hours Fasting Lipid Panel: No results found for this basename: CHOL,HDL,LDLCALC,TRIG,CHOLHDL,LDLDIRECT in the last 72 hours Thyroid Function Tests: No results found for this basename: TSH,T4TOTAL,FREET4,T3FREE,THYROIDAB in the last 72 hours Anemia Panel: No results found for this basename: VITAMINB12,FOLATE,FERRITIN,TIBC,IRON,RETICCTPCT in the last 72 hours T. Jackson. Medications: I have reviewed the patient's current medications.  Assessment/Plan:   SBO (small bowel obstruction) status post exploratory laparotomy and lysis of adhesion. Doing well postoperatively. Her bowel function is improving. She is tolerating solid foods.   Chronic a-fib: Her rate is now well controlled. She is back on her oral medications. Coumadin was restarted yesterday.   Hypertension: Her blood pressure is trending up. Coreg was restarted at its prehospital dose yesterday. Will restart losartan and HCTZ at slightly lower doses and follow.   Leukocytosis: Her white blood cell count is now within normal limits.  Post-op. May also be sec to recent steroids for gout. Vancomycin added. We'll discontinue vancomycin today.  Anemia: Her hemoglobin is drifting downward. Coumadin was started yesterday. This will be followed closely. We'll order CBC in the morning.  Hypocalcemia: In part secondary to hypoalbuminemia. Will add calcium carbonate once daily to  Thrombocytopenia:  Resolved.   DM type 2 (diabetes mellitus, type 2): Continue  current treatment. Reasonable control.  Gout attack: S/p 1 dose of Toradol. Given Solumedrol on several occasions. Once daily colchicine has been restarted.    ARF (acute renal failure): Resolved.   CKD (chronic kidney disease) Her renal function has returned to baseline.   Borderline abnormal TFTs: Repeat outpatient thyroid function tests once acute illness has  resolved. Not entirely consistent with hyperthyroidism. Could be euthyroid sick syndrome. Candidal vulvovaginitis: Diflucan given. Deconditioning/debility: According to the physical therapist, she will need short-term rehabilitation, although the patient wants to go home. This will be discussed further with the patient and her family.   LOS: 12 days   Karmela Bram 09/28/2010, 11:17 AM

## 2010-09-28 NOTE — Progress Notes (Signed)
Physical Therapy Treatment Patient Name: [name] [name] [id number] Date: 09/28/2010 Problem List:  Patient Active Problem List  Diagnoses  . Nausea and vomiting  . SBO (small bowel obstruction)  . ARF (acute renal failure)  . CKD (chronic kidney disease)  . Hypokalemia  . Chronic a-fib  . Warfarin anticoagulation  . Elevated troponin I level  . Hypotension  . Thrombocytopenia  . DM type 2 (diabetes mellitus, type 2)  . Hypomagnesemia  . Hypocalcemia  . Hypernatremia  . Gout attack  . Anemia  . Borderline abnormal TFTs  . Leukocytosis (leucocytosis)   Past Medical History:  Past Medical History  Diagnosis Date  . Atrial fibrillation   . Hypertension   . Hypercholesterolemia   . Seasonal allergies   . Acid reflux   . CHF (congestive heart failure)     Hx of diastolic CHF.  Marland Kitchen Gout   . Diverticular hemorrhage     2011  . Thrombocytopenia due to drugs     Allopurinol  . CKD (chronic kidney disease) stage 3, GFR 30-59 ml/min   . Hyperglycemia     Query DM 2   Past Surgical History:  Past Surgical History  Procedure Date  . Cesarean section   . Cataract extraction   . Joint replacement     Total right knee 2007  . Appendectomy   . Tubal ligation   . Laparotomy 09/22/2010    Procedure: EXPLORATORY LAPAROTOMY;  Surgeon: [name];  Location: [facility];  Service: General;  Laterality: N/A;  Lysis of Adhesions   Precautions/Restrictions  Precautions Precautions: Fall Required Braces or Orthoses: No Restrictions Weight Bearing Restrictions: No Other Position/Activity Restrictions: weight bearing is functionally limited by foot pain Mobility (including Balance) Bed Mobility Bed Mobility: No Right Sidelying to Sit Details (indicate cue type and reason): pt up in chair-did not wish to get back to bed Transfers Sit to Stand: 3: Mod assist;With armrests;From chair/3-in-1 Sit to Stand Details (indicate cue type and reason):  needs cues to use armrests on chair to  push up (uses lift chair at home)-unable to get up off of chair any other way Stand to Sit: 4: Min assist Stand Pivot Transfer Details (indicate cue type and reason): not done-too "tired" Ambulation/Gait Ambulation/Gait: No (pain only "4"in feet with weitght bearing, but too tired to ) Stairs: No Wheelchair Mobility Wheelchair Mobility: No  Posture/Postural Control Posture/Postural Control: No significant limitations Balance Balance Assessed:  (WNL) Exercise  Total Joint Exercises Ankle Circles/Pumps: AROM;Both;Seated Quad Sets: AROM;Both;10 reps;Seated Short Arc Quad: AROM;Both;10 reps;Seated Hip ABduction/ADduction: Strengthening;Both;10 reps;Seated Long Arc Quad: Both;10 reps;AROM;Seated General Exercises - Lower Extremity Ankle Circles/Pumps: AROM;Both;Seated Quad Sets: AROM;Both;10 reps;Seated Short Arc Quad: AROM;Both;10 reps;Seated Long Arc Quad: Both;10 reps;AROM;Seated Hip ABduction/ADduction: Strengthening;Both;10 reps;Seated Hip Flexion/Marching: AROM;Both;10 reps;Seated Low Level/ICU Exercises Ankle Circles/Pumps: AROM;Both;Seated Quad Sets: AROM;Both;10 reps;Seated Short Arc Quad: AROM;Both;10 reps;Seated Hip ABduction/ADduction: Strengthening;Both;10 reps;Seated  End of Session PT - End of Session Equipment Utilized During Treatment: Gait belt Activity Tolerance: Patient tolerated treatment well;Patient limited by fatigue Patient left: in chair;with call bell in reach;with family/visitor present Nurse Communication: Mobility status for transfers General Behavior During Session: [facility] for tasks performed Cognition: [facility] for tasks performed PT Assessment/Plan  PT - Assessment/Plan Comments on Treatment Session: mobility still very limited-pain from gout seems to be significantly reduced, but pt is now very deconditioned PT Plan: Discharge plan remains appropriate PT Frequency: Min 3X/week Follow Up Recommendations: Home health PT;Skilled nursing  facility Equipment Recommended: Defer to next  venue PT Goals  Acute Rehab PT Goals PT Transfer Goal: Sit to Stand/Stand to Sit - Progress: Progressing toward goal PT Goal: Stand - Progress: Met  [name] 09/28/2010, 2:58 PM

## 2010-09-29 LAB — CBC
HCT: 23 % — ABNORMAL LOW (ref 36.0–46.0)
Hemoglobin: 7.7 g/dL — ABNORMAL LOW (ref 12.0–15.0)
MCH: 28.3 pg (ref 26.0–34.0)
MCHC: 33.5 g/dL (ref 30.0–36.0)
MCV: 84.6 fL (ref 78.0–100.0)
Platelets: 155 10*3/uL (ref 150–400)
RBC: 2.72 MIL/uL — ABNORMAL LOW (ref 3.87–5.11)
RDW: 15.8 % — ABNORMAL HIGH (ref 11.5–15.5)
WBC: 6.6 10*3/uL (ref 4.0–10.5)

## 2010-09-29 LAB — GLUCOSE, CAPILLARY
Glucose-Capillary: 110 mg/dL — ABNORMAL HIGH (ref 70–99)
Glucose-Capillary: 106 mg/dL — ABNORMAL HIGH (ref 70–99)
Glucose-Capillary: 103 mg/dL — ABNORMAL HIGH (ref 70–99)
Glucose-Capillary: 118 mg/dL — ABNORMAL HIGH (ref 70–99)
Glucose-Capillary: 169 mg/dL — ABNORMAL HIGH (ref 70–99)
Glucose-Capillary: 75 mg/dL (ref 70–99)
Glucose-Capillary: 87 mg/dL (ref 70–99)
Glucose-Capillary: 108 mg/dL — ABNORMAL HIGH (ref 70–99)
Glucose-Capillary: 96 mg/dL (ref 70–99)

## 2010-09-29 LAB — PROTIME-INR
INR: 1.32 (ref 0.00–1.49)
Prothrombin Time: 16.6 seconds — ABNORMAL HIGH (ref 11.6–15.2)

## 2010-09-29 LAB — PREPARE RBC (CROSSMATCH)

## 2010-09-29 MED ORDER — SODIUM CHLORIDE 0.9 % IJ SOLN
INTRAMUSCULAR | Status: AC
  Administered 2010-09-29: 10 mL
  Filled 2010-09-29: qty 10

## 2010-09-29 MED ORDER — FUROSEMIDE 10 MG/ML IJ SOLN
20.0000 mg | Freq: Once | INTRAMUSCULAR | Status: AC
  Administered 2010-09-29: 20 mg via INTRAVENOUS
  Filled 2010-09-29: qty 2

## 2010-09-29 MED ORDER — POTASSIUM CHLORIDE CRYS ER 20 MEQ PO TBCR
20.0000 meq | EXTENDED_RELEASE_TABLET | Freq: Two times a day (BID) | ORAL | Status: DC
  Administered 2010-09-29 – 2010-10-01 (×5): 20 meq via ORAL
  Filled 2010-09-29 (×5): qty 1

## 2010-09-29 MED ORDER — FUROSEMIDE 20 MG PO TABS
20.0000 mg | ORAL_TABLET | Freq: Every day | ORAL | Status: DC
  Administered 2010-09-29 – 2010-10-01 (×3): 20 mg via ORAL
  Filled 2010-09-29 (×3): qty 1

## 2010-09-29 MED ORDER — COLCHICINE 0.6 MG PO TABS
0.6000 mg | ORAL_TABLET | Freq: Two times a day (BID) | ORAL | Status: DC
  Administered 2010-09-29 – 2010-10-01 (×5): 0.6 mg via ORAL
  Filled 2010-09-29 (×5): qty 1

## 2010-09-29 NOTE — Progress Notes (Signed)
7 Days Post-Op  Subjective: No acute change. Still states she feels good. Still tolerating a regular diet. Still having good bowel function. Denies any abdominal pain.  Objective: Vital signs in last 24 hours: Temp:  [98 F (36.7 C)-98.3 F (36.8 C)] 98.2 F (36.8 C) (08/24 0453) Pulse Rate:  [74-94] 94  (08/24 0453) Resp:  [18-20] 20  (08/24 0453) BP: (131-158)/(77-83) 131/77 mmHg (08/24 0453) SpO2:  [95 %-99 %] 99 % (08/24 0453) Last BM Date: 09/29/10  Intake/Output from previous day: 08/23 0701 - 08/24 0700 In: 1451 [P.O.:600; I.V.:547] Out: 125 [Urine:125] Intake/Output this shift: I/O this shift: In: 120 [P.O.:120] Out: -   General appearance: alert and no distress GI: Positive bowel sounds, soft, mild tenderness, no diffuse peritoneal signs, incision is clean dry and intact.  Lab Results:  @LABLAST2 (wbc:2,hgb:2,hct:2,plt:2) BMET  Basename 09/28/10 0507 09/27/10 0442  NA 137 140  K 3.8 4.2  CL 108 111  CO2 23 23  GLUCOSE 79 93  BUN 13 20  CREATININE 0.53 0.64  CALCIUM 7.3* 7.4*   PT/INR  Basename 09/29/10 0457 09/28/10 1150  LABPROT 16.6* 16.0*  INR 1.32 1.25   ABG No results found for this basename: PHART:2,PCO2:2,PO2:2,HCO3:2 in the last 72 hours  Studies/Results: No results found.  Anti-infectives: Anti-infectives     Start     Dose/Rate Route Frequency Ordered Stop   09/27/10 2000   vancomycin (VANCOCIN) 1,250 mg in sodium chloride 0.9 % 250 mL IVPB  Status:  Discontinued        1,250 mg 166.7 mL/hr over 90 Minutes Intravenous Every 24 hours 09/26/10 1947 09/27/10 1009   09/27/10 1100   vancomycin (VANCOCIN) 750 mg in sodium chloride 0.9 % 150 mL IVPB  Status:  Discontinued        750 mg 150 mL/hr over 60 Minutes Intravenous Every 12 hours 09/27/10 1010 09/28/10 1304   09/26/10 2000   vancomycin (VANCOCIN) IVPB 1000 mg/200 mL premix        1,000 mg 200 mL/hr over 60 Minutes Intravenous  Once 09/26/10 1947 09/26/10 2336   09/24/10 1800    fluconazole (DIFLUCAN) IVPB 200 mg        200 mg 100 mL/hr over 60 Minutes Intravenous  Once 09/24/10 1747 09/24/10 1959   09/22/10 1430   ceFAZolin (ANCEF) IVPB 1 g/50 mL premix        1 g 100 mL/hr over 30 Minutes Intravenous  Once 09/22/10 1422 09/22/10 1500   09/22/10 1429   ceFAZolin (ANCEF) 1 G injection     Comments: DANIEL, KAREN: cabinet override         09/22/10 1429 09/22/10 1432   09/22/10 0930   cefOXitin (MEFOXIN) 1 g in dextrose 5 % 50 mL IVPB        1 g 100 mL/hr over 30 Minutes Intravenous 60 min pre-op 09/21/10 2019 09/22/10 1042          Assessment/Plan: s/p Procedure(s): EXPLORATORY LAPAROTOMY Improved bowel function. Agree with transfusion due to drop in hemoglobin. I do not have a clear source but at this point doubts an intra-abdominal source based on her exam and clinical picture. Should she respond appropriately to the transfusion and maintain hemoglobin thyroid still be comfortable with possible discharge over the weekend.  LOS: 13 days    Hilman Kissling C 09/29/2010

## 2010-09-29 NOTE — Progress Notes (Signed)
09/29/2010 [facility] referral completed with referral to [facility] for RN and PT. Hospital bed and bsc to be delivered when pt d/c.

## 2010-09-29 NOTE — Consult Note (Signed)
[place] for warfarin Indication: atrial fibrillation  Patient Measurements: Height: 5\' 5"  (165.1 cm) Weight: 179 lb 10.8 oz (81.5 kg) IBW/kg (Calculated) : 57   Vital Signs: Temp: 98.2 F (36.8 C) (08/24 0453) BP: 131/77 mmHg (08/24 0453) Pulse Rate: 94  (08/24 0453)  Labs: INR Last Three Days: Recent Labs  Basename 09/29/10 0457 09/28/10 1150   INR 1.32 1.25     Basename 09/29/10 0457 09/28/10 1150 09/28/10 0507 09/27/10 0442  HGB 7.7* -- 8.1* --  HCT 23.0* -- 24.8* 27.0*  PLT 155 -- 164 170  APTT -- -- -- --  LABPROT 16.6* 16.0* -- --  INR 1.32 1.25 -- --  HEPARINUNFRC -- -- -- --  CREATININE -- -- 0.53 0.64  CRCLEARANCE -- -- -- --  CKTOTAL -- -- -- --  CKMB -- -- -- --  TROPONINI -- -- -- --   Medications:  Scheduled:     . calcium-vitamin D  1 tablet Oral Daily  . carvedilol  12.5 mg Oral BID WC  . colchicine  0.6 mg Oral BID  . diltiazem  120 mg Oral Daily  . furosemide  20 mg Intravenous Once  . furosemide  20 mg Oral Daily  . hydrochlorothiazide  12.5 mg Oral Daily  . insulin aspart  0-9 Units Subcutaneous Q4H  . olmesartan  20 mg Oral Daily  . pantoprazole  40 mg Oral Q1200  . potassium chloride  20 mEq Oral BID  . sodium chloride  10 mL Intracatheter Q12H  . sodium chloride      . warfarin  4 mg Oral q1800  . DISCONTD: colchicine  0.6 mg Oral Daily  . DISCONTD: enoxaparin  40 mg Subcutaneous Q24H  . DISCONTD: pantoprazole (PROTONIX) IV  40 mg Intravenous Q24H  . DISCONTD: vancomycin  750 mg Intravenous Q12H  . DISCONTD: warfarin  4 mg Oral Daily    Assessment: MD resumed Lovenox 40mg  sq q24hrs for VTE prophylaxis until INR therapeutic.  Goal of Therapy:  INR 2-3   Plan: Coumadin 4mg  daily for now INR daily until stable. Lovenox 40mg  sq daily until therapeutic INR achieved       Arna Snipe 09/29/2010,10:19 AM

## 2010-09-29 NOTE — Progress Notes (Signed)
Subjective: She has no complaints of chest pain, abdominal pain, or shortness of breath. She does complain of some fluid retention in her legs. She wants her gout medicine increased to twice daily. She ate all of her breakfast this morning. She denies any bloody stools.  Objective: Vital signs in last 24 hours: Filed Vitals:   09/28/10 0613 09/28/10 1400 09/28/10 2132 09/29/10 0453  BP: 175/79 137/78 158/83 131/77  Pulse: 90 87 74 94  Temp: 98.2 F (36.8 C) 98 F (36.7 C) 98.3 F (36.8 C) 98.2 F (36.8 C)  TempSrc:  Oral Oral   Resp: 20 18 18 20   Height:      Weight:      SpO2: 98% 98% 95% 99%    Intake/Output Summary (Last 24 hours) at 09/29/10 0909 Last data filed at 09/29/10 0800  Gross per 24 hour  Intake   1331 ml  Output    125 ml  Net   1206 ml     Weight change:   General appearance: Comfortable, lying in bed. Resp: clear to auscultation bilaterally, without wheezes rhonchi or rales Cardio: irregularly irregular rhythm. GI: soft, nondistended;  bowel sounds present; nontender; staples in place without erythema or drainage. Extremities: Trace of pedal edema bilaterally. No acute hot red joints.   Lab Results: Basic Metabolic Panel:  Basename 09/28/10 0507 09/27/10 0442  NA 137 140  K 3.8 4.2  CL 108 111  CO2 23 23  GLUCOSE 79 93  BUN 13 20  CREATININE 0.53 0.64  CALCIUM 7.3* 7.4*  MG -- --  PHOS -- --   Liver Function Tests: No results found for this basename: AST:2,ALT:2,ALKPHOS:2,BILITOT:2,PROT:2,ALBUMIN:2 in the last 72 hours No results found for this basename: LIPASE:2,AMYLASE:2 in the last 72 hours CBC:  Basename 09/29/10 0457 09/28/10 0507  WBC 6.6 6.7  NEUTROABS -- --  HGB 7.7* 8.1*  HCT 23.0* 24.8*  MCV 84.6 84.6  PLT 155 164   Cardiac Enzymes: No results found for this basename: CKTOTAL:3,CKMB:3,CKMBINDEX:3,TROPONINI:3 in the last 72 hours BNP: No results found for this basename: POCBNP:3 in the last 72 hours D-Dimer: No results  found for this basename: DDIMER:2 in the last 72 hours CBG:  Basename 09/29/10 0821 09/29/10 0416 09/28/10 2349 09/28/10 2005 09/28/10 1634 09/28/10 0623  GLUCAP 106* 110* 95 113* 116* 79   Hemoglobin A1C: No results found for this basename: HGBA1C in the last 72 hours Fasting Lipid Panel: No results found for this basename: CHOL,HDL,LDLCALC,TRIG,CHOLHDL,LDLDIRECT in the last 72 hours Thyroid Function Tests: No results found for this basename: TSH,T4TOTAL,FREET4,T3FREE,THYROIDAB in the last 72 hours Anemia Panel: No results found for this basename: VITAMINB12,FOLATE,FERRITIN,TIBC,IRON,RETICCTPCT in the last 72 hours T. [facility]. Medications: I have reviewed the patient's current medications.  Assessment/Plan:   SBO (small bowel obstruction) status post exploratory laparotomy and lysis of adhesion. Doing well postoperatively. Her bowel function is improving. She is tolerating solid foods.   Chronic a-fib: Her rate is now well controlled. She is back on her oral medications. Coumadin was restarted the day before yesterday.   Hypertension: Her blood pressure is trending up. Coreg was restarted at its prehospital dose yesterday. Losartan and HCTZ were started at slightly lower doses   Mild peripheral edema. We will give her Lasix IV following the transfusion and restart standing oral dose of Lasix daily. We'll add twice a day dosing of potassium chloride for 3 days.  Leukocytosis: Her white blood cell count is now within normal limits.  Post-op. May also be  sec to recent steroids for gout. Vancomycin was started and then discontinued.  Anemia: Her hemoglobin is drifting downward.  We will transfuse her one unit of packed red blood cells. We will discontinue subcutaneous Lovenox. We'll continue Coumadin for now. We'll follow her hemoglobin and hematocrit closely over the next day or 2 for stabilization. We will continue PPI. Check labs in the morning.  Hypocalcemia: In part secondary to  hypoalbuminemia. Will add calcium carbonate once daily.  Thrombocytopenia:  Resolved.   DM type 2 (diabetes mellitus, type 2): Continue current treatment. Reasonable control.  Gout attack: S/p 1 dose of Toradol. Given Solumedrol on several occasions. We will increase her colchicine to twice a day as requested. Her renal function is within normal limits.    ARF (acute renal failure): Resolved.   CKD (chronic kidney disease) Her renal function has returned to baseline.   Borderline abnormal TFTs: Repeat outpatient thyroid function tests once acute illness has resolved. Not entirely consistent with hyperthyroidism. Could be euthyroid sick syndrome.  Candidal vulvovaginitis: Diflucan given.  Deconditioning/debility: The patient refuses to go to rehabilitation. Her family is in agreement that she will return home. Home health will be arranged.     LOS: 13 days   Tarrah Furuta 09/29/2010, 9:09 AM

## 2010-09-29 NOTE — Progress Notes (Signed)
Physical Therapy Treatment Patient Name: [name] [name] [id number] Date: 09/29/2010 Problem List:  Patient Active Problem List  Diagnoses  . Nausea and vomiting  . SBO (small bowel obstruction)  . ARF (acute renal failure)  . CKD (chronic kidney disease)  . Hypokalemia  . Chronic a-fib  . Warfarin anticoagulation  . Elevated troponin I level  . Hypotension  . Thrombocytopenia  . DM type 2 (diabetes mellitus, type 2)  . Hypomagnesemia  . Hypocalcemia  . Hypernatremia  . Gout attack  . Anemia  . Borderline abnormal TFTs  . Leukocytosis (leucocytosis)   Past Medical History:  Past Medical History  Diagnosis Date  . Atrial fibrillation   . Hypertension   . Hypercholesterolemia   . Seasonal allergies   . Acid reflux   . CHF (congestive heart failure)     Hx of diastolic CHF.  Marland Kitchen Gout   . Diverticular hemorrhage     2011  . Thrombocytopenia due to drugs     Allopurinol  . CKD (chronic kidney disease) stage 3, GFR 30-59 ml/min   . Hyperglycemia     Query DM 2   Past Surgical History:  Past Surgical History  Procedure Date  . Cesarean section   . Cataract extraction   . Joint replacement     Total right knee 2007  . Appendectomy   . Tubal ligation   . Laparotomy 09/22/2010    Procedure: EXPLORATORY LAPAROTOMY;  Surgeon: [name];  Location: [facility];  Service: General;  Laterality: N/A;  Lysis of Adhesions   Precautions/Restrictions  Precautions Precautions: Fall Required Braces or Orthoses: No Restrictions Weight Bearing Restrictions: No Other Position/Activity Restrictions: weight bearing is functionally limited by foot pain Mobility (including Balance) Transfers Sit to Stand: 3: Mod assist;From chair/3-in-1;With armrests;With upper extremity assist Sit to Stand Details (indicate cue type and reason): transfer somewhat less labored today-repeated 5 times Stand to Sit: 4: Min assist Ambulation/Gait Ambulation/Gait: No Stairs: No Wheelchair  Mobility Wheelchair Mobility: No    Exercise  Total Joint Exercises Ankle Circles/Pumps: AROM;20 reps;Seated;Both Short Arc Quad: AROM;Both;10 reps;Seated Hip ABduction/ADduction: Strengthening;Both;10 reps;Seated Long Arc Quad: AROM;Both;20 reps;Seated General Exercises - Lower Extremity Ankle Circles/Pumps: AROM;20 reps;Seated;Both Short Arc Quad: AROM;Both;10 reps;Seated Long Arc Quad: AROM;Both;20 reps;Seated Hip ABduction/ADduction: Strengthening;Both;10 reps;Seated Hip Flexion/Marching: AROM;Both;10 reps;Seated Low Level/ICU Exercises Ankle Circles/Pumps: AROM;20 reps;Seated;Both Short Arc Quad: AROM;Both;10 reps;Seated Hip ABduction/ADduction: Strengthening;Both;10 reps;Seated  End of Session PT - End of Session Equipment Utilized During Treatment: Gait belt Activity Tolerance: Patient tolerated treatment well;Patient limited by fatigue Patient left: in chair;with call bell in reach;with family/visitor present Nurse Communication: Mobility status for transfers General Behavior During Session: [facility] for tasks performed Cognition: [facility] for tasks performed PT Assessment/Plan  PT - Assessment/Plan Comments on Treatment Session: still c/o fatigue-currently receiving blood PT Plan: Discharge plan remains appropriate PT Frequency: Min 3X/week Follow Up Recommendations: Home health PT;Skilled nursing facility PT Goals  Acute Rehab PT Goals PT Transfer Goal: Sit to Stand/Stand to Sit - Progress: Progressing toward goal  [name] [name] 09/29/2010, 2:24 PM

## 2010-09-30 LAB — CBC
HCT: 26.2 % — ABNORMAL LOW (ref 36.0–46.0)
Hemoglobin: 9 g/dL — ABNORMAL LOW (ref 12.0–15.0)
MCH: 28.7 pg (ref 26.0–34.0)
MCHC: 34.4 g/dL (ref 30.0–36.0)
MCV: 83.4 fL (ref 78.0–100.0)
Platelets: 172 10*3/uL (ref 150–400)
RBC: 3.14 MIL/uL — ABNORMAL LOW (ref 3.87–5.11)
RDW: 15.7 % — ABNORMAL HIGH (ref 11.5–15.5)
WBC: 7.6 10*3/uL (ref 4.0–10.5)

## 2010-09-30 LAB — GLUCOSE, CAPILLARY
Glucose-Capillary: 108 mg/dL — ABNORMAL HIGH (ref 70–99)
Glucose-Capillary: 116 mg/dL — ABNORMAL HIGH (ref 70–99)
Glucose-Capillary: 121 mg/dL — ABNORMAL HIGH (ref 70–99)
Glucose-Capillary: 159 mg/dL — ABNORMAL HIGH (ref 70–99)
Glucose-Capillary: 95 mg/dL (ref 70–99)
Glucose-Capillary: 97 mg/dL (ref 70–99)

## 2010-09-30 LAB — BASIC METABOLIC PANEL
BUN: 10 mg/dL (ref 6–23)
CO2: 26 mEq/L (ref 19–32)
Calcium: 6.9 mg/dL — ABNORMAL LOW (ref 8.4–10.5)
Chloride: 104 mEq/L (ref 96–112)
Creatinine, Ser: 0.6 mg/dL (ref 0.50–1.10)
GFR calc Af Amer: >60 mL/min (ref >60)
GFR calc non Af Amer: >60 mL/min (ref >60)
Glucose, Bld: 96 mg/dL (ref 70–99)
Potassium: 3.4 mEq/L — ABNORMAL LOW (ref 3.5–5.1)
Sodium: 138 mEq/L (ref 135–145)

## 2010-09-30 LAB — PROTIME-INR
INR: 1.47 (ref 0.00–1.49)
Prothrombin Time: 18.1 seconds — ABNORMAL HIGH (ref 11.6–15.2)

## 2010-09-30 MED ORDER — KETOROLAC TROMETHAMINE 30 MG/ML IJ SOLN
30.0000 mg | Freq: Once | INTRAMUSCULAR | Status: AC
  Administered 2010-09-30: 30 mg via INTRAVENOUS
  Filled 2010-09-30: qty 1

## 2010-09-30 MED ORDER — CARVEDILOL 12.5 MG PO TABS
25.0000 mg | ORAL_TABLET | Freq: Two times a day (BID) | ORAL | Status: DC
  Administered 2010-10-01: 25 mg via ORAL
  Filled 2010-09-30: qty 1

## 2010-09-30 NOTE — Progress Notes (Addendum)
Subjective: Gout pain remains. No other complaints. She is tolerating a diet.  Objective: Vital signs in last 24 hours: Filed Vitals:   09/29/10 1752 09/29/10 2200 09/30/10 0600 09/30/10 1400  BP: 165/88 169/76 160/67 148/73  Pulse: 97 80 66 86  Temp: 97.8 F (36.6 C) 98.8 F (37.1 C) 98.9 F (37.2 C) 98.1 F (36.7 C)  TempSrc: Oral Oral Oral Oral  Resp: 20 18 20 18   Height:      Weight:      SpO2: 98% 97% 98% 96%    Intake/Output Summary (Last 24 hours) at 09/30/10 1739 Last data filed at 09/30/10 1200  Gross per 24 hour  Intake    360 ml  Output    550 ml  Net   -190 ml   General appearance: Comfortable, lying in bed. Resp: clear to auscultation bilaterally, without wheezes rhonchi or rales Cardio: irregularly irregular rhythm. GI: soft, nondistended;  bowel sounds present; nontender; staples in place without erythema or drainage. Extremities: Trace of pedal edema bilaterally. Right first MTP joint tender   Lab Results: Basic Metabolic Panel:  Basename 09/30/10 0554 09/28/10 0507  NA 138 137  K 3.4* 3.8  CL 104 108  CO2 26 23  GLUCOSE 96 79  BUN 10 13  CREATININE 0.60 0.53  CALCIUM 6.9* 7.3*  MG -- --  PHOS -- --   Liver Function Tests: No results found for this basename: AST:2,ALT:2,ALKPHOS:2,BILITOT:2,PROT:2,ALBUMIN:2 in the last 72 hours No results found for this basename: LIPASE:2,AMYLASE:2 in the last 72 hours CBC:  Basename 09/30/10 0554 09/29/10 0457  WBC 7.6 6.6  NEUTROABS -- --  HGB 9.0* 7.7*  HCT 26.2* 23.0*  MCV 83.4 84.6  PLT 172 155   Cardiac Enzymes: No results found for this basename: CKTOTAL:3,CKMB:3,CKMBINDEX:3,TROPONINI:3 in the last 72 hours BNP: No results found for this basename: POCBNP:3 in the last 72 hours D-Dimer: No results found for this basename: DDIMER:2 in the last 72 hours CBG:  Basename 09/30/10 1108 09/30/10 0721 09/30/10 0422 09/30/10 0011 09/29/10 2021 09/29/10 1713  GLUCAP 108* 97 121* 116* 96 108*    Hemoglobin A1C: No results found for this basename: HGBA1C in the last 72 hours Fasting Lipid Panel: No results found for this basename: CHOL,HDL,LDLCALC,TRIG,CHOLHDL,LDLDIRECT in the last 72 hours Thyroid Function Tests: No results found for this basename: TSH,T4TOTAL,FREET4,T3FREE,THYROIDAB in the last 72 hours Anemia Panel: No results found for this basename: VITAMINB12,FOLATE,FERRITIN,TIBC,IRON,RETICCTPCT in the last 72 hours T. Jackson. Medications: I have reviewed the patient's current medications.  Assessment/Plan: Anemia improved. We'll check another CBC in the morning. If hemoglobin stable, can probably be discharged. We'll give another dose of Toradol and continue colchicine. Continue potassium for hypokalemia. No bleeding. Increase carvedilol to 25 mg twice a day for better blood pressure control.     LOS: 14 days   [name] [name] 09/30/2010, 5:39 PM

## 2010-09-30 NOTE — Consult Note (Signed)
[place] for warfarin Indication: atrial fibrillation  Patient Measurements: Height: 5\' 5"  (165.1 cm) Weight: 179 lb 10.8 oz (81.5 kg) IBW/kg (Calculated) : 57   Vital Signs: Temp: 98.9 F (37.2 C) (08/25 0600) Temp src: Oral (08/25 0600) BP: 160/67 mmHg (08/25 0600) Pulse Rate: 66  (08/25 0600)  Labs: INR Last Three Days: Recent Labs  Basename 09/30/10 0554 09/29/10 0457 09/28/10 1150   INR 1.47 1.32 1.25     Basename 09/30/10 0554 09/29/10 0457 09/28/10 1150 09/28/10 0507  HGB 9.0* 7.7* -- --  HCT 26.2* 23.0* -- 24.8*  PLT 172 155 -- 164  APTT -- -- -- --  LABPROT 18.1* 16.6* 16.0* --  INR 1.47 1.32 1.25 --  HEPARINUNFRC -- -- -- --  CREATININE 0.60 -- -- 0.53  CRCLEARANCE -- -- -- --  CKTOTAL -- -- -- --  CKMB -- -- -- --  TROPONINI -- -- -- --   Medications:  Scheduled:     . calcium-vitamin D  1 tablet Oral Daily  . carvedilol  12.5 mg Oral BID WC  . colchicine  0.6 mg Oral BID  . diltiazem  120 mg Oral Daily  . furosemide  20 mg Intravenous Once  . furosemide  20 mg Oral Daily  . hydrochlorothiazide  12.5 mg Oral Daily  . insulin aspart  0-9 Units Subcutaneous Q4H  . olmesartan  20 mg Oral Daily  . pantoprazole  40 mg Oral Q1200  . potassium chloride  20 mEq Oral BID  . sodium chloride  10 mL Intracatheter Q12H  . warfarin  4 mg Oral q1800  . DISCONTD: colchicine  0.6 mg Oral Daily    Assessment: MD resumed Lovenox 40mg  sq q24hrs for VTE prophylaxis until INR therapeutic.  Goal of Therapy:  INR 2-3   Plan: Coumadin 4mg  daily for now INR daily until stable. Lovenox 40mg  sq daily until therapeutic INR achieved       Arna Snipe 09/30/2010,7:59 AM

## 2010-09-30 NOTE — Progress Notes (Signed)
8 Days Post-Op  Subjective: Doing well, no complaints.  Objective: Vital signs in last 24 hours: Temp:  [97.1 F (36.2 C)-98.9 F (37.2 C)] 98.9 F (37.2 C) (08/25 0600) Pulse Rate:  [66-98] 66  (08/25 0600) Resp:  [18-20] 20  (08/25 0600) BP: (159-185)/(67-101) 160/67 mmHg (08/25 0600) SpO2:  [97 %-98 %] 98 % (08/25 0600) Last BM Date: 09/30/10  Intake/Output from previous day: 08/24 0701 - 08/25 0700 In: 1130 [P.O.:620; I.V.:510] Out: 550 [Urine:550] Intake/Output this shift: I/O this shift: In: 120 [P.O.:120] Out: -   General appearance: alert, cooperative and no distress Resp: clear to auscultation bilaterally Cardio: regular rate and rhythm, S1, S2 normal, no murmur, click, rub or gallop Abdomen soft, nontender, nondistended. Incision healing well. Good bowel sounds heard.  Lab Results:   [facility] 09/30/10 0554 09/29/10 0457  WBC 7.6 6.6  HGB 9.0* 7.7*  HCT 26.2* 23.0*  PLT 172 155   BMET  Basename 09/30/10 0554 09/28/10 0507  NA 138 137  K 3.4* 3.8  CL 104 108  CO2 26 23  GLUCOSE 96 79  BUN 10 13  CREATININE 0.60 0.53  CALCIUM 6.9* 7.3*   PT/INR  Basename 09/30/10 0554 09/29/10 0457  LABPROT 18.1* 16.6*  INR 1.47 1.32    Studies/Results: No results found. pathology Anti-infectives: Anti-infectives     Start     Dose/Rate Route Frequency Ordered Stop   09/27/10 2000   vancomycin (VANCOCIN) 1,250 mg in sodium chloride 0.9 % 250 mL IVPB  Status:  Discontinued        1,250 mg 166.7 mL/hr over 90 Minutes Intravenous Every 24 hours 09/26/10 1947 09/27/10 1009   09/27/10 1100   vancomycin (VANCOCIN) 750 mg in sodium chloride 0.9 % 150 mL IVPB  Status:  Discontinued        750 mg 150 mL/hr over 60 Minutes Intravenous Every 12 hours 09/27/10 1010 09/28/10 1304   09/26/10 2000   vancomycin (VANCOCIN) IVPB 1000 mg/200 mL premix        1,000 mg 200 mL/hr over 60 Minutes Intravenous  Once 09/26/10 1947 09/26/10 2336   09/24/10 1800   fluconazole  (DIFLUCAN) IVPB 200 mg        200 mg 100 mL/hr over 60 Minutes Intravenous  Once 09/24/10 1747 09/24/10 1959   09/22/10 1430   ceFAZolin (ANCEF) IVPB 1 g/50 mL premix        1 g 100 mL/hr over 30 Minutes Intravenous  Once 09/22/10 1422 09/22/10 1500   09/22/10 1429   ceFAZolin (ANCEF) 1 G injection     Comments: DANIEL, KAREN: cabinet override         09/22/10 1429 09/22/10 1432   09/22/10 0930   cefOXitin (MEFOXIN) 1 g in dextrose 5 % 50 mL IVPB        1 g 100 mL/hr over 30 Minutes Intravenous 60 min pre-op 09/21/10 2019 09/22/10 1042          Assessment/Plan: s/p Procedure(s): EXPLORATORY LAPAROTOMY  assessment: Hemoglobin responded well and appropriately to blood transfusion. Plan: Would continue to monitor hemoglobin. We'll follow with you.   LOS: 14 days    [name] [name] A 09/30/2010

## 2010-10-01 LAB — GLUCOSE, CAPILLARY
Glucose-Capillary: 107 mg/dL — ABNORMAL HIGH (ref 70–99)
Glucose-Capillary: 103 mg/dL — ABNORMAL HIGH (ref 70–99)
Glucose-Capillary: 112 mg/dL — ABNORMAL HIGH (ref 70–99)

## 2010-10-01 LAB — PROTIME-INR
INR: 1.68 — ABNORMAL HIGH (ref 0.00–1.49)
Prothrombin Time: 20.1 seconds — ABNORMAL HIGH (ref 11.6–15.2)

## 2010-10-01 LAB — CBC
HCT: 25.5 % — ABNORMAL LOW (ref 36.0–46.0)
Hemoglobin: 8.5 g/dL — ABNORMAL LOW (ref 12.0–15.0)
MCH: 28.2 pg (ref 26.0–34.0)
MCHC: 33.3 g/dL (ref 30.0–36.0)
MCV: 84.7 fL (ref 78.0–100.0)
Platelets: 171 10*3/uL (ref 150–400)
RBC: 3.01 MIL/uL — ABNORMAL LOW (ref 3.87–5.11)
RDW: 16.1 % — ABNORMAL HIGH (ref 11.5–15.5)
WBC: 6.7 10*3/uL (ref 4.0–10.5)

## 2010-10-01 MED ORDER — WARFARIN SODIUM 3 MG PO TABS: 3.0000 mg | ORAL_TABLET | Freq: Every day | ORAL | Status: DC

## 2010-10-01 MED ORDER — HYDROCODONE-ACETAMINOPHEN 5-500 MG PO CAPS: 1.0000 | ORAL_CAPSULE | Freq: Four times a day (QID) | ORAL | Status: AC | PRN

## 2010-10-01 MED ORDER — SODIUM CHLORIDE 0.9 % IJ SOLN
INTRAMUSCULAR | Status: AC
  Administered 2010-10-01: 10 mL
  Filled 2010-10-01: qty 10

## 2010-10-01 NOTE — Progress Notes (Signed)
9 Days Post-Op  Subjective: No complaints.  Objective: Vital signs in last 24 hours: Temp:  [98.1 F (36.7 C)] 98.1 F (36.7 C) (08/25 2123) Pulse Rate:  [73-86] 84  (08/26 0500) Resp:  [18-20] 20  (08/26 0500) BP: (134-148)/(73-79) 134/73 mmHg (08/26 0500) SpO2:  [93 %-97 %] 97 % (08/26 0500) Last BM Date: 09/30/10  Intake/Output from previous day: 08/25 0701 - 08/26 0700 In: 460 [P.O.:460] Out: -  Intake/Output this shift:    GI: soft, non-tender; bowel sounds normal; no masses,  no organomegaly. Incision healing well.  Lab Results:   Basename 10/01/10 0507 09/30/10 0554  WBC 6.7 7.6  HGB 8.5* 9.0*  HCT 25.5* 26.2*  PLT 171 172   BMET  Basename 09/30/10 0554  NA 138  K 3.4*  CL 104  CO2 26  GLUCOSE 96  BUN 10  CREATININE 0.60  CALCIUM 6.9*   PT/INR  Basename 10/01/10 0507 09/30/10 0554  LABPROT 20.1* 18.1*  INR 1.68* 1.47    Studies/Results: No results found.  Anti-infectives: Anti-infectives     Start     Dose/Rate Route Frequency Ordered Stop   09/27/10 2000   vancomycin (VANCOCIN) 1,250 mg in sodium chloride 0.9 % 250 mL IVPB  Status:  Discontinued        1,250 mg 166.7 mL/hr over 90 Minutes Intravenous Every 24 hours 09/26/10 1947 09/27/10 1009   09/27/10 1100   vancomycin (VANCOCIN) 750 mg in sodium chloride 0.9 % 150 mL IVPB  Status:  Discontinued        750 mg 150 mL/hr over 60 Minutes Intravenous Every 12 hours 09/27/10 1010 09/28/10 1304   09/26/10 2000   vancomycin (VANCOCIN) IVPB 1000 mg/200 mL premix        1,000 mg 200 mL/hr over 60 Minutes Intravenous  Once 09/26/10 1947 09/26/10 2336   09/24/10 1800   fluconazole (DIFLUCAN) IVPB 200 mg        200 mg 100 mL/hr over 60 Minutes Intravenous  Once 09/24/10 1747 09/24/10 1959   09/22/10 1430   ceFAZolin (ANCEF) IVPB 1 g/50 mL premix        1 g 100 mL/hr over 30 Minutes Intravenous  Once 09/22/10 1422 09/22/10 1500   09/22/10 1429   ceFAZolin (ANCEF) 1 G injection     Comments:  DANIEL, KAREN: cabinet override         09/22/10 1429 09/22/10 1432   09/22/10 0930   cefOXitin (MEFOXIN) 1 g in dextrose 5 % 50 mL IVPB        1 g 100 mL/hr over 30 Minutes Intravenous 60 min pre-op 09/21/10 2019 09/22/10 1042          Assessment/Plan: s/p Procedure(s): EXPLORATORY LAPAROTOMY Assessment: Anemia relatively stable. Nothing to add from a surgery standpoint. Plan: Agree with followup CBC in a.m.  LOS: 15 days    Nanetta Wiegman A 10/01/2010

## 2010-10-01 NOTE — Discharge Summary (Signed)
Physician Discharge Summary  Patient ID: [name] [name] MRN: [id number] DOB/AGE: 05/22/1930 77 y.o.  Admit date: 09/16/2010 Discharge date: 10/01/2010  Discharge Diagnoses:  Principal Problem:  *SBO (small bowel obstruction) Active Problems:  Chronic a-fib  DM type 2 (diabetes mellitus, type 2)  Gout attack  ARF (acute renal failure)  CKD (chronic kidney disease)  Anemia  Borderline abnormal TFTs  Hypokalemia  Warfarin anticoagulation  Elevated troponin I level  Hypotension  Thrombocytopenia  Hypomagnesemia  Hypocalcemia  Hypernatremia  Leukocytosis (leucocytosis)   Current Discharge Medication List    START taking these medications   Details  hydrocodone-acetaminophen (LORCET-HD) 5-500 MG per capsule Take 1 capsule by mouth every 6 (six) hours as needed for pain. Qty: 20 capsule, Refills: 0      CONTINUE these medications which have CHANGED   Details  warfarin (COUMADIN) 3 MG tablet Take 1 tablet (3 mg total) by mouth daily.      CONTINUE these medications which have NOT CHANGED   Details  carvedilol (COREG) 25 MG tablet Take 25 mg by mouth 2 (two) times daily.      colchicine 0.6 MG tablet Take 0.6 mg by mouth 2 (two) times daily.      diltiazem (DILACOR XR) 120 MG 24 hr capsule Take 120 mg by mouth daily.      lovastatin (MEVACOR) 40 MG tablet Take 40 mg by mouth at bedtime.      mirtazapine (REMERON) 30 MG tablet Take 30 mg by mouth at bedtime.      montelukast (SINGULAIR) 10 MG tablet Take 10 mg by mouth every morning.      valsartan-hydrochlorothiazide (DIOVAN-HCT) 320-12.5 MG per tablet Take 1 tablet by mouth daily.      furosemide (LASIX) 20 MG tablet Take 30 mg by mouth 2 (two) times daily as needed. Take 1 and 1/2 tablets daily for fluid       STOP taking these medications     hydrALAZINE (APRESOLINE) 25 MG tablet      pantoprazole (PROTONIX) 40 MG tablet         Discharge Orders    Future Orders Please Complete By Expires   Diet - low  sodium heart healthy      Increase activity slowly      Discharge instructions      Comments:   May shower. No heavy lifting. No driving. Keep incision clean and dry.   Call MD for:  temperature >100.4      Call MD for:  persistant nausea and vomiting      Call MD for:  redness, tenderness, or signs of infection (pain, swelling, redness, odor or green/yellow discharge around incision site)         Follow-up Information    [facility] with [facility] [phone]       Follow up with ZIEGLER,BRENT C. Make an appointment in 1 week.   Contact information:   [place] [phone]       Follow up with VYAS,DHRUV B., MD. Make an appointment in 2 weeks.   Contact information:   [address] [place] [phone]          Disposition:  home with home health nursing  Discharged Condition: Stable  Consults:   [place]  Activity: Increase slowly. No driving. No heavy lifting. May shower. Keep incision clean and dry.  Labs:   Results for orders placed during the hospital encounter of 09/16/10 (from  the past 48 hour(s))  GLUCOSE, CAPILLARY     Status: Abnormal   Collection Time   09/29/10 11:44 AM      Component Value Range Comment   Glucose-Capillary 103 (*) 70 - 99 (mg/dL)   GLUCOSE, CAPILLARY     Status: Abnormal   Collection Time   09/29/10  5:13 PM      Component Value Range Comment   Glucose-Capillary 108 (*) 70 - 99 (mg/dL)   GLUCOSE, CAPILLARY     Status: Normal   Collection Time   09/29/10  8:21 PM      Component Value Range Comment   Glucose-Capillary 96  70 - 99 (mg/dL)    Comment 1 Notify RN     GLUCOSE, CAPILLARY     Status: Abnormal   Collection Time   09/30/10 12:11 AM      Component Value Range Comment   Glucose-Capillary 116 (*) 70 - 99 (mg/dL)    Comment 1 Notify RN     GLUCOSE, CAPILLARY     Status: Abnormal   Collection Time   09/30/10  4:22 AM      Component Value Range  Comment   Glucose-Capillary 121 (*) 70 - 99 (mg/dL)    Comment 1 Notify RN     PROTIME-INR     Status: Abnormal   Collection Time   09/30/10  5:54 AM      Component Value Range Comment   Prothrombin Time 18.1 (*) 11.6 - 15.2 (seconds)    INR 1.47  0.00 - 1.49    CBC     Status: Abnormal   Collection Time   09/30/10  5:54 AM      Component Value Range Comment   WBC 7.6  4.0 - 10.5 (K/uL)    RBC 3.14 (*) 3.87 - 5.11 (MIL/uL)    Hemoglobin 9.0 (*) 12.0 - 15.0 (g/dL)    HCT 26.2 (*) 36.0 - 46.0 (%)    MCV 83.4  78.0 - 100.0 (fL)    MCH 28.7  26.0 - 34.0 (pg)    MCHC 34.4  30.0 - 36.0 (g/dL)    RDW 15.7 (*) 11.5 - 15.5 (%)    Platelets 172  150 - 400 (K/uL)   BASIC METABOLIC PANEL     Status: Abnormal   Collection Time   09/30/10  5:54 AM      Component Value Range Comment   Sodium 138  135 - 145 (mEq/L)    Potassium 3.4 (*) 3.5 - 5.1 (mEq/L)    Chloride 104  96 - 112 (mEq/L)    CO2 26  19 - 32 (mEq/L)    Glucose, Bld 96  70 - 99 (mg/dL)    BUN 10  6 - 23 (mg/dL)    Creatinine, Ser 0.60  0.50 - 1.10 (mg/dL)    Calcium 6.9 (*) 8.4 - 10.5 (mg/dL)    GFR calc non Af Amer >60  >60 (mL/min)    GFR calc Af Amer >60  >60 (mL/min)   GLUCOSE, CAPILLARY     Status: Normal   Collection Time   09/30/10  7:21 AM      Component Value Range Comment   Glucose-Capillary 97  70 - 99 (mg/dL)   GLUCOSE, CAPILLARY     Status: Abnormal   Collection Time   09/30/10 11:08 AM      Component Value Range Comment   Glucose-Capillary 108 (*) 70 - 99 (mg/dL)   GLUCOSE, CAPILLARY  Status: Abnormal   Collection Time   09/30/10  8:02 PM      Component Value Range Comment   Glucose-Capillary 159 (*) 70 - 99 (mg/dL)    Comment 1 Notify RN      Comment 2 Documented in Chart     GLUCOSE, CAPILLARY     Status: Normal   Collection Time   09/30/10 11:03 PM      Component Value Range Comment   Glucose-Capillary 95  70 - 99 (mg/dL)    Comment 1 Notify RN      Comment 2 Documented in Chart     GLUCOSE,  CAPILLARY     Status: Abnormal   Collection Time   10/01/10  4:07 AM      Component Value Range Comment   Glucose-Capillary 107 (*) 70 - 99 (mg/dL)   PROTIME-INR     Status: Abnormal   Collection Time   10/01/10  5:07 AM      Component Value Range Comment   Prothrombin Time 20.1 (*) 11.6 - 15.2 (seconds)    INR 1.68 (*) 0.00 - 1.49    CBC     Status: Abnormal   Collection Time   10/01/10  5:07 AM      Component Value Range Comment   WBC 6.7  4.0 - 10.5 (K/uL)    RBC 3.01 (*) 3.87 - 5.11 (MIL/uL)    Hemoglobin 8.5 (*) 12.0 - 15.0 (g/dL)    HCT 25.5 (*) 36.0 - 46.0 (%)    MCV 84.7  78.0 - 100.0 (fL)    MCH 28.2  26.0 - 34.0 (pg)    MCHC 33.3  30.0 - 36.0 (g/dL)    RDW 16.1 (*) 11.5 - 15.5 (%)    Platelets 171  150 - 400 (K/uL)   GLUCOSE, CAPILLARY     Status: Abnormal   Collection Time   10/01/10  7:17 AM      Component Value Range Comment   Glucose-Capillary 103 (*) 70 - 99 (mg/dL)   GLUCOSE, CAPILLARY     Status: Abnormal   Collection Time   10/01/10 11:24 AM      Component Value Range Comment   Glucose-Capillary 112 (*) 70 - 99 (mg/dL)     Diagnostics:  Ct Abdomen Pelvis Wo Contrast  09/16/2010  *RADIOLOGY REPORT*  Clinical Data: Abdominal pain, vomiting  CT ABDOMEN AND PELVIS WITHOUT CONTRAST  Technique:  Multidetector CT imaging of the abdomen and pelvis was performed following the standard protocol without intravenous contrast.  Comparison: 07/14/2009  Findings: Lung bases are essentially clear.  Liver is notable for a punctate calcified granuloma.  Spleen, pancreas, and adrenal glands within normal limits.  Gallbladder is notable for layering sludge/stones, without associated inflammatory changes.  No intrahepatic or extrahepatic ductal dilatation.  Kidneys are grossly unremarkable.  No hydronephrosis.  Multiple dilated loops of small bowel, with transition in the right lower abdomen (series 4/image 35), compatible with high grade small bowel obstruction.  Decompressed colon with  numerous colonic diverticula, without associated inflammatory changes.  Small volume perihepatic and pelvic ascites.  Uterus unremarkable.  No adnexal masses.  Bladder is decompressed by Foley catheter.  Degenerative changes of the visualized thoracolumbar spine.  IMPRESSION: High grade small bowel obstruction with transition in the right lower abdomen, as described above.  Original Report Authenticated By: Julian Hy, M.D.   Dg Chest 1 View  09/16/2010  *RADIOLOGY REPORT*  Clinical Data: 77 year old female with hypotension, tachycardia, vomiting.  CHEST -  1 VIEW  Comparison: Upright AP view at 1610 hours.  Findings: Chronic cardiomegaly.  Stable cardiac size and mediastinal contours.  Lordotic view.  Lung volumes appear stable. No pneumothorax, pulmonary edema, pleural effusion or confluent pulmonary opacity identified.  IMPRESSION: Stable cardiomegaly.  No acute cardiopulmonary abnormality.  Original Report Authenticated By: Randall An, M.D.   Dg Abd 1 View  09/18/2010  *RADIOLOGY REPORT*  Clinical Data: Small bowel obstruction  ABDOMEN - 1 VIEW  Comparison: CT abdomen and pelvis 09/16/2010  Findings: Paucity of colonic gas. Persistent dilatation of a small bowel loop in the mid abdomen, 4 mm diameter. Findings remain compatible with small bowel obstruction. Bones appear demineralized with degenerative disc and facet disease changes lumbar spine. Few scattered vascular calcifications. No definite urinary tract calcification.  IMPRESSION: Persistent small bowel dilatation consistent with small bowel obstruction. Paucity of colonic gas.  Original Report Authenticated By: Burnetta Sabin, M.D.   US Renal  09/17/2010  *RADIOLOGY REPORT*  Clinical Data: 77 year old female with acute renal failure.  RENAL/URINARY TRACT ULTRASOUND COMPLETE  Comparison:  CTs of the abdomen and pelvis 09/16/2010 and earlier. Abdomen MRI 11/26/2008.  Findings:  Right Kidney:  No hydronephrosis.  Renal length 10.5 cm.   Cortical echotexture and thickness within normal limits for age.  Occasional sub centimeter simple appearing cortical cysts.  Trace fluid in the the anterior pararenal space.  Left Kidney:  No hydronephrosis.  Renal length 10.1 cm.  Renal cortical thickness and echotexture within normal limits for age. Lobulated appearance of the left kidney appears stable from prior exams.  No focal left renal lesion.  Trace pararenal fluid.  Bladder:  None identified, reportedly decompressed by Foley catheter.  Other findings:  Fluid filled bowel loops.  IMPRESSION: 1.  No acute renal findings. 2.  Trace pararenal fluid and multiple fluid filled bowel loops, see abdomen and pelvis CT report 09/16/2010.  Original Report Authenticated By: Randall An, M.D.   Dg Chest Portable 1 View  09/23/2010  *RADIOLOGY REPORT*  Clinical Data:  PICC line placement  PORTABLE CHEST - 1 VIEW  Comparison: Portable exam 1706 hours repeated at 1723 hours compared to 09/16/2010  Findings: Right arm PICC line, tip at cavoatrial junction. Nasogastric tube extends into abdomen. Enlargement of cardiac silhouette. Pulmonary vascular congestion. Atherosclerotic calcification aorta. Bibasilar atelectasis. No definite acute infiltrate or edema. No pneumothorax.  IMPRESSION: Tip of right arm PICC line at cavoatrial junction. Enlargement of cardiac silhouette with pulmonary vascular congestion. Minimal bibasilar atelectasis.  Original Report Authenticated By: Burnetta Sabin, M.D.   Dg Abd 2 Views  09/20/2010  *RADIOLOGY REPORT*  Clinical Data: Small bowel obstruction  ABDOMEN - 2 VIEW  Comparison: 09/18/2010  Findings: Progressive dilatation of air-filled small bowel loops compatible with high-grade small bowel obstruction. No colonic gas identified. No definite bowel wall thickening or free intraperitoneal air. Levoconvex thoracolumbar scoliosis with multilevel degenerative disc and facet disease changes lumbar spine. Osseous demineralization. Vascular  calcifications and anastomotic staple line in pelvis.  IMPRESSION: Progressive small bowel dilatation compatible with high-grade small bowel obstruction.  Original Report Authenticated By: Burnetta Sabin, M.D.   EKG: Atrial fibrillation with rapid ventricular response. Nonspecific ST and T-wave abnormalities  Procedures: PICC line, exploratory laparotomy and lysis of adhesions by Dr. Geroge Baseman on 09/22/2010  Hospital Course:  Please see H&P for complete admission details. The patient is a pleasant 77 year old black female who presented with vomiting for several days. She had diminished bowel sounds and tenderness. Her abdomen  was soft. On CAT scan, she had a high-grade small bowel obstruction. An NG tube was placed. She had been on Coumadin for atrial fibrillation prior to admission. She was admitted to the hospitalist service. She required IV Cardizem for rate control and was in the step down unit for several days. She failed conservative management and required exploratory laparotomy and lysis of adhesions. At the time of discharge, she was medically stable, tolerating a diet, had stable vital signs and having bowel movements. Placement was offered but patient declines and wishes to go home with family members and home health.  She had a gout flare that was prolonged during the hospitalization but at the time of discharge this has improved tremendously.  She had abnormal thyroid function tests and these will need to be repeated as an outpatient when she is medically stable.  For much of the hospitalization her heart rate was uncontrolled and medications required adjustment but at discharge her heart rate is well controlled. She remains in atrial fibrillation. Her Coumadin has been resumed with the blessing of surgery. She has no evidence of bleeding at this time.  She did require transfusion. At discharge her hemoglobin has been stable at 9-1/2-10  Total time on the day of discharge is greater than  30 minutes. Discharge Exam: Blood pressure 134/73, pulse 84, temperature 98.1 F (36.7 C), temperature source Oral, resp. rate 20, height 5\' 5"  (1.651 m), weight 81.5 kg (179 lb 10.8 oz), SpO2 97.00%. Unchanged from 10/01/2010   Signed: Doree Barthel L 10/01/2010, 11:38 AM

## 2010-10-01 NOTE — Consult Note (Signed)
[place] for warfarin Indication: atrial fibrillation  Patient Measurements: Height: 5\' 5"  (165.1 cm) Weight: 179 lb 10.8 oz (81.5 kg) IBW/kg (Calculated) : 57   Vital Signs: Temp: 98.1 F (36.7 C) (08/25 2123) Temp src: Oral (08/26 0500) BP: 134/73 mmHg (08/26 0500) Pulse Rate: 84  (08/26 0500)  Labs: INR Last Three Days: Recent Labs  Basename 10/01/10 0507 09/30/10 0554 09/29/10 0457   INR 1.68* 1.47 1.32     Basename 10/01/10 0507 09/30/10 0554 09/29/10 0457  HGB 8.5* 9.0* --  HCT 25.5* 26.2* 23.0*  PLT 171 172 155  APTT -- -- --  LABPROT 20.1* 18.1* 16.6*  INR 1.68* 1.47 1.32  HEPARINUNFRC -- -- --  CREATININE -- 0.60 --  CRCLEARANCE -- -- --  CKTOTAL -- -- --  CKMB -- -- --  TROPONINI -- -- --   Medications:  Scheduled:     . calcium-vitamin D  1 tablet Oral Daily  . carvedilol  25 mg Oral BID WC  . colchicine  0.6 mg Oral BID  . diltiazem  120 mg Oral Daily  . furosemide  20 mg Oral Daily  . hydrochlorothiazide  12.5 mg Oral Daily  . insulin aspart  0-9 Units Subcutaneous Q4H  . ketorolac  30 mg Intravenous Once  . olmesartan  20 mg Oral Daily  . pantoprazole  40 mg Oral Q1200  . potassium chloride  20 mEq Oral BID  . sodium chloride  10 mL Intracatheter Q12H  . warfarin  4 mg Oral q1800  . DISCONTD: carvedilol  12.5 mg Oral BID WC    Assessment: MD resumed Lovenox 40mg  sq q24hrs for VTE prophylaxis until INR therapeutic.  Goal of Therapy:  INR 2-3   Plan: Coumadin 4mg  daily for now INR daily until stable. Lovenox 40mg  sq daily until therapeutic INR achieved       Arna Snipe 10/01/2010,7:56 AM

## 2010-10-03 LAB — TYPE AND SCREEN
ABO/RH(D): O NEG
Antibody Screen: NEGATIVE
Unit division: 0
Unit division: 0

## 2010-10-10 NOTE — Progress Notes (Signed)
Encounter addended by: Harriet Pho on: 10/10/2010 10:28 AM<BR>     Documentation filed: Flowsheet VN

## 2010-11-06 LAB — BASIC METABOLIC PANEL
BUN: 17
BUN: 19
BUN: 20
CO2: 23
CO2: 24
CO2: 26
Calcium: 7.7 — ABNORMAL LOW
Calcium: 8.1 — ABNORMAL LOW
Calcium: 8.1 — ABNORMAL LOW
Chloride: 102
Chloride: 102
Chloride: 103
Creatinine, Ser: 1.14
Creatinine, Ser: 1.25 — ABNORMAL HIGH
Creatinine, Ser: 1.26 — ABNORMAL HIGH
GFR calc Af Amer: 50 — ABNORMAL LOW
GFR calc Af Amer: 51 — ABNORMAL LOW
GFR calc Af Amer: 56 — ABNORMAL LOW
GFR calc non Af Amer: 41 — ABNORMAL LOW
GFR calc non Af Amer: 42 — ABNORMAL LOW
GFR calc non Af Amer: 46 — ABNORMAL LOW
Glucose, Bld: 102 — ABNORMAL HIGH
Glucose, Bld: 125 — ABNORMAL HIGH
Glucose, Bld: 98
Potassium: 3.8
Potassium: 3.9
Potassium: 4.2
Sodium: 134 — ABNORMAL LOW
Sodium: 136
Sodium: 138

## 2010-11-06 LAB — GLUCOSE, CAPILLARY
Glucose-Capillary: 100 — ABNORMAL HIGH
Glucose-Capillary: 108 — ABNORMAL HIGH
Glucose-Capillary: 109 — ABNORMAL HIGH
Glucose-Capillary: 110 — ABNORMAL HIGH
Glucose-Capillary: 112 — ABNORMAL HIGH
Glucose-Capillary: 119 — ABNORMAL HIGH
Glucose-Capillary: 122 — ABNORMAL HIGH
Glucose-Capillary: 134 — ABNORMAL HIGH
Glucose-Capillary: 138 — ABNORMAL HIGH
Glucose-Capillary: 143 — ABNORMAL HIGH
Glucose-Capillary: 147 — ABNORMAL HIGH
Glucose-Capillary: 86
Glucose-Capillary: 95
Glucose-Capillary: 98

## 2010-11-06 LAB — TYPE AND SCREEN
ABO/RH(D): O NEG
Antibody Screen: NEGATIVE

## 2010-11-06 LAB — CBC
HCT: 32 — ABNORMAL LOW
HCT: 33.5 — ABNORMAL LOW
HCT: 34.8 — ABNORMAL LOW
HCT: 42
Hemoglobin: 10.7 — ABNORMAL LOW
Hemoglobin: 11.2 — ABNORMAL LOW
Hemoglobin: 11.6 — ABNORMAL LOW
Hemoglobin: 13.8
MCHC: 32.8
MCHC: 33.3
MCHC: 33.5
MCHC: 33.5
MCV: 84.2
MCV: 85.5
MCV: 85.7
MCV: 85.9
Platelets: 142 — ABNORMAL LOW
Platelets: 150
Platelets: 159
Platelets: 180
RBC: 3.73 — ABNORMAL LOW
RBC: 3.98
RBC: 4.05
RBC: 4.91
RDW: 14
RDW: 14.1
RDW: 14.1
RDW: 14.4
WBC: 10.4
WBC: 10.5
WBC: 5.4
WBC: 5.8

## 2010-11-06 LAB — URINALYSIS, ROUTINE W REFLEX MICROSCOPIC
Glucose, UA: NEGATIVE
Hgb urine dipstick: NEGATIVE
Ketones, ur: NEGATIVE
Nitrite: NEGATIVE
Protein, ur: NEGATIVE
Specific Gravity, Urine: 1.02
Urobilinogen, UA: 1
pH: 5.5

## 2010-11-06 LAB — URINE MICROSCOPIC-ADD ON

## 2010-11-06 LAB — COMPREHENSIVE METABOLIC PANEL
ALT: 19
AST: 23
Albumin: 4.1
Alkaline Phosphatase: 74
BUN: 16
CO2: 25
Calcium: 8.9
Chloride: 107
Creatinine, Ser: 1.12
GFR calc Af Amer: 57 — ABNORMAL LOW
GFR calc non Af Amer: 47 — ABNORMAL LOW
Glucose, Bld: 84
Potassium: 3.9
Sodium: 141
Total Bilirubin: 1.2
Total Protein: 7.1

## 2010-11-06 LAB — PROTIME-INR
INR: 1.2
INR: 1.3
INR: 1.8 — ABNORMAL HIGH
INR: 2.1 — ABNORMAL HIGH
INR: 2.2 — ABNORMAL HIGH
INR: 2.3 — ABNORMAL HIGH
Prothrombin Time: 15.3 — ABNORMAL HIGH
Prothrombin Time: 17 — ABNORMAL HIGH
Prothrombin Time: 21.6 — ABNORMAL HIGH
Prothrombin Time: 25.3 — ABNORMAL HIGH
Prothrombin Time: 26 — ABNORMAL HIGH
Prothrombin Time: 26.9 — ABNORMAL HIGH

## 2010-11-06 LAB — APTT
aPTT: 40 — ABNORMAL HIGH
aPTT: 56 — ABNORMAL HIGH

## 2011-08-03 ENCOUNTER — Encounter (HOSPITAL_COMMUNITY): Payer: Self-pay | Admitting: *Deleted

## 2011-08-03 ENCOUNTER — Inpatient Hospital Stay (HOSPITAL_COMMUNITY)
Admission: EM | Admit: 2011-08-03 | Discharge: 2011-08-05 | DRG: 378 | Disposition: A | Discharge status: Home / Self Care | Payer: Medicare Other | Attending: Internal Medicine | Admitting: Internal Medicine

## 2011-08-03 DIAGNOSIS — E876 Hypokalemia: Secondary | ICD-10-CM

## 2011-08-03 DIAGNOSIS — M109 Gout, unspecified: Secondary | ICD-10-CM

## 2011-08-03 DIAGNOSIS — K219 Gastro-esophageal reflux disease without esophagitis: Secondary | ICD-10-CM | POA: Diagnosis present

## 2011-08-03 DIAGNOSIS — D72829 Elevated white blood cell count, unspecified: Secondary | ICD-10-CM

## 2011-08-03 DIAGNOSIS — D500 Iron deficiency anemia secondary to blood loss (chronic): Secondary | ICD-10-CM | POA: Diagnosis present

## 2011-08-03 DIAGNOSIS — N179 Acute kidney failure, unspecified: Secondary | ICD-10-CM | POA: Diagnosis present

## 2011-08-03 DIAGNOSIS — N189 Chronic kidney disease, unspecified: Secondary | ICD-10-CM

## 2011-08-03 DIAGNOSIS — K5900 Constipation, unspecified: Secondary | ICD-10-CM | POA: Diagnosis present

## 2011-08-03 DIAGNOSIS — T45515A Adverse effect of anticoagulants, initial encounter: Secondary | ICD-10-CM

## 2011-08-03 DIAGNOSIS — K648 Other hemorrhoids: Secondary | ICD-10-CM | POA: Diagnosis present

## 2011-08-03 DIAGNOSIS — E860 Dehydration: Secondary | ICD-10-CM | POA: Diagnosis present

## 2011-08-03 DIAGNOSIS — I10 Essential (primary) hypertension: Secondary | ICD-10-CM | POA: Diagnosis present

## 2011-08-03 DIAGNOSIS — Z7901 Long term (current) use of anticoagulants: Secondary | ICD-10-CM

## 2011-08-03 DIAGNOSIS — R739 Hyperglycemia, unspecified: Secondary | ICD-10-CM | POA: Diagnosis present

## 2011-08-03 DIAGNOSIS — D649 Anemia, unspecified: Secondary | ICD-10-CM | POA: Diagnosis present

## 2011-08-03 DIAGNOSIS — K5790 Diverticulosis of intestine, part unspecified, without perforation or abscess without bleeding: Secondary | ICD-10-CM | POA: Diagnosis present

## 2011-08-03 DIAGNOSIS — Z882 Allergy status to sulfonamides status: Secondary | ICD-10-CM

## 2011-08-03 DIAGNOSIS — R778 Other specified abnormalities of plasma proteins: Secondary | ICD-10-CM

## 2011-08-03 DIAGNOSIS — E870 Hyperosmolality and hypernatremia: Secondary | ICD-10-CM

## 2011-08-03 DIAGNOSIS — I509 Heart failure, unspecified: Secondary | ICD-10-CM | POA: Diagnosis present

## 2011-08-03 DIAGNOSIS — I498 Other specified cardiac arrhythmias: Secondary | ICD-10-CM | POA: Diagnosis present

## 2011-08-03 DIAGNOSIS — R7309 Other abnormal glucose: Secondary | ICD-10-CM | POA: Diagnosis present

## 2011-08-03 DIAGNOSIS — D696 Thrombocytopenia, unspecified: Secondary | ICD-10-CM | POA: Diagnosis present

## 2011-08-03 DIAGNOSIS — K922 Gastrointestinal hemorrhage, unspecified: Secondary | ICD-10-CM

## 2011-08-03 DIAGNOSIS — Z8249 Family history of ischemic heart disease and other diseases of the circulatory system: Secondary | ICD-10-CM

## 2011-08-03 DIAGNOSIS — N183 Chronic kidney disease, stage 3 unspecified: Secondary | ICD-10-CM | POA: Diagnosis present

## 2011-08-03 DIAGNOSIS — I482 Chronic atrial fibrillation, unspecified: Secondary | ICD-10-CM | POA: Diagnosis present

## 2011-08-03 DIAGNOSIS — E119 Type 2 diabetes mellitus without complications: Secondary | ICD-10-CM

## 2011-08-03 DIAGNOSIS — E7800 Pure hypercholesterolemia, unspecified: Secondary | ICD-10-CM | POA: Diagnosis present

## 2011-08-03 DIAGNOSIS — D6832 Hemorrhagic disorder due to extrinsic circulating anticoagulants: Secondary | ICD-10-CM

## 2011-08-03 DIAGNOSIS — K56609 Unspecified intestinal obstruction, unspecified as to partial versus complete obstruction: Secondary | ICD-10-CM

## 2011-08-03 DIAGNOSIS — Z886 Allergy status to analgesic agent status: Secondary | ICD-10-CM

## 2011-08-03 DIAGNOSIS — R000 Tachycardia, unspecified: Secondary | ICD-10-CM

## 2011-08-03 DIAGNOSIS — I129 Hypertensive chronic kidney disease with stage 1 through stage 4 chronic kidney disease, or unspecified chronic kidney disease: Secondary | ICD-10-CM | POA: Diagnosis present

## 2011-08-03 DIAGNOSIS — I4891 Unspecified atrial fibrillation: Secondary | ICD-10-CM | POA: Diagnosis present

## 2011-08-03 DIAGNOSIS — R001 Bradycardia, unspecified: Secondary | ICD-10-CM | POA: Diagnosis not present

## 2011-08-03 DIAGNOSIS — R946 Abnormal results of thyroid function studies: Secondary | ICD-10-CM

## 2011-08-03 DIAGNOSIS — Z79899 Other long term (current) drug therapy: Secondary | ICD-10-CM

## 2011-08-03 HISTORY — DX: Other hemorrhoids: K64.8

## 2011-08-03 HISTORY — DX: Gastrointestinal hemorrhage, unspecified: K92.2

## 2011-08-03 HISTORY — DX: Diverticulosis of intestine, part unspecified, without perforation or abscess without bleeding: K57.90

## 2011-08-03 HISTORY — DX: Long term (current) use of anticoagulants: Z79.01

## 2011-08-03 LAB — CBC WITH DIFFERENTIAL/PLATELET
Basophils Absolute: 0 10*3/uL (ref 0.0–0.1)
Basophils Relative: 1 % (ref 0–1)
Eosinophils Absolute: 0.4 10*3/uL (ref 0.0–0.7)
Eosinophils Relative: 9 % — ABNORMAL HIGH (ref 0–5)
HCT: 34.7 % — ABNORMAL LOW (ref 36.0–46.0)
Hemoglobin: 11.1 g/dL — ABNORMAL LOW (ref 12.0–15.0)
Lymphocytes Relative: 34 % (ref 12–46)
Lymphs Abs: 1.5 10*3/uL (ref 0.7–4.0)
MCH: 27.1 pg (ref 26.0–34.0)
MCHC: 32 g/dL (ref 30.0–36.0)
MCV: 84.8 fL (ref 78.0–100.0)
Monocytes Absolute: 0.3 10*3/uL (ref 0.1–1.0)
Monocytes Relative: 8 % (ref 3–12)
Neutro Abs: 2.1 10*3/uL (ref 1.7–7.7)
Neutrophils Relative %: 49 % (ref 43–77)
Platelets: 133 10*3/uL — ABNORMAL LOW (ref 150–400)
RBC: 4.09 MIL/uL (ref 3.87–5.11)
RDW: 15.7 % — ABNORMAL HIGH (ref 11.5–15.5)
WBC: 4.3 10*3/uL (ref 4.0–10.5)

## 2011-08-03 LAB — COMPREHENSIVE METABOLIC PANEL
ALT: 16 U/L (ref 0–35)
AST: 18 U/L (ref 0–37)
Albumin: 3.9 g/dL (ref 3.5–5.2)
Alkaline Phosphatase: 100 U/L (ref 39–117)
BUN: 73 mg/dL — ABNORMAL HIGH (ref 6–23)
CO2: 21 mEq/L (ref 19–32)
Calcium: 9.7 mg/dL (ref 8.4–10.5)
Chloride: 107 mEq/L (ref 96–112)
Creatinine, Ser: 2.01 mg/dL — ABNORMAL HIGH (ref 0.50–1.10)
GFR calc Af Amer: 26 mL/min — ABNORMAL LOW (ref >90)
GFR calc non Af Amer: 22 mL/min — ABNORMAL LOW (ref >90)
Glucose, Bld: 138 mg/dL — ABNORMAL HIGH (ref 70–99)
Potassium: 4.1 mEq/L (ref 3.5–5.1)
Sodium: 141 mEq/L (ref 135–145)
Total Bilirubin: 0.3 mg/dL (ref 0.3–1.2)
Total Protein: 7.7 g/dL (ref 6.0–8.3)

## 2011-08-03 LAB — LIPASE, BLOOD: Lipase: 43 U/L (ref 11–59)

## 2011-08-03 LAB — URINALYSIS, ROUTINE W REFLEX MICROSCOPIC
Bilirubin Urine: NEGATIVE
Glucose, UA: NEGATIVE mg/dL
Ketones, ur: NEGATIVE mg/dL
Leukocytes, UA: NEGATIVE
Nitrite: NEGATIVE
Protein, ur: NEGATIVE mg/dL
Specific Gravity, Urine: 1.01 (ref 1.005–1.030)
Urobilinogen, UA: 0.2 mg/dL (ref 0.0–1.0)
pH: 6 (ref 5.0–8.0)

## 2011-08-03 LAB — URINE MICROSCOPIC-ADD ON

## 2011-08-03 LAB — CBC
HCT: 33.9 % — ABNORMAL LOW (ref 36.0–46.0)
Hemoglobin: 11 g/dL — ABNORMAL LOW (ref 12.0–15.0)
MCH: 27.4 pg (ref 26.0–34.0)
MCHC: 32.4 g/dL (ref 30.0–36.0)
MCV: 84.5 fL (ref 78.0–100.0)
Platelets: 108 K/uL — ABNORMAL LOW (ref 150–400)
RBC: 4.01 MIL/uL (ref 3.87–5.11)
RDW: 15.7 % — ABNORMAL HIGH (ref 11.5–15.5)
WBC: 4.4 K/uL (ref 4.0–10.5)

## 2011-08-03 LAB — PROTIME-INR
INR: 3.33 — ABNORMAL HIGH (ref 0.00–1.49)
Prothrombin Time: 34.3 seconds — ABNORMAL HIGH (ref 11.6–15.2)

## 2011-08-03 MED ORDER — ONDANSETRON HCL 4 MG PO TABS: 4.0000 mg | ORAL_TABLET | Freq: Four times a day (QID) | ORAL | Status: DC | PRN

## 2011-08-03 MED ORDER — LEVALBUTEROL HCL 0.63 MG/3ML IN NEBU: 0.6300 mg | INHALATION_SOLUTION | Freq: Four times a day (QID) | RESPIRATORY_TRACT | Status: DC | PRN

## 2011-08-03 MED ORDER — CARVEDILOL 12.5 MG PO TABS
25.0000 mg | ORAL_TABLET | Freq: Two times a day (BID) | ORAL | Status: DC
  Administered 2011-08-03 – 2011-08-05 (×4): 25 mg via ORAL
  Filled 2011-08-03 (×3): qty 1
  Filled 2011-08-03 (×3): qty 2
  Filled 2011-08-03 (×2): qty 1
  Filled 2011-08-03: qty 2
  Filled 2011-08-03: qty 1

## 2011-08-03 MED ORDER — PANTOPRAZOLE SODIUM 40 MG IV SOLR
40.0000 mg | Freq: Once | INTRAVENOUS | Status: AC
  Administered 2011-08-03: 40 mg via INTRAVENOUS
  Filled 2011-08-03: qty 40

## 2011-08-03 MED ORDER — ACETAMINOPHEN 650 MG RE SUPP: 650.0000 mg | Freq: Four times a day (QID) | RECTAL | Status: DC | PRN

## 2011-08-03 MED ORDER — MONTELUKAST SODIUM 10 MG PO TABS: 10.0000 mg | ORAL_TABLET | ORAL | Status: DC

## 2011-08-03 MED ORDER — HYDRALAZINE HCL 25 MG PO TABS
25.0000 mg | ORAL_TABLET | Freq: Three times a day (TID) | ORAL | Status: DC
  Administered 2011-08-03 – 2011-08-05 (×4): 25 mg via ORAL
  Filled 2011-08-03 (×5): qty 1

## 2011-08-03 MED ORDER — SIMVASTATIN 20 MG PO TABS: 20.0000 mg | ORAL_TABLET | Freq: Every day | ORAL | Status: DC

## 2011-08-03 MED ORDER — MONTELUKAST SODIUM 10 MG PO TABS
10.0000 mg | ORAL_TABLET | Freq: Every day | ORAL | Status: DC
  Administered 2011-08-04 – 2011-08-05 (×2): 10 mg via ORAL
  Filled 2011-08-03 (×2): qty 1

## 2011-08-03 MED ORDER — POTASSIUM CHLORIDE IN NACL 20-0.9 MEQ/L-% IV SOLN
INTRAVENOUS | Status: DC
  Administered 2011-08-03 – 2011-08-05 (×5): via INTRAVENOUS

## 2011-08-03 MED ORDER — ONDANSETRON HCL 4 MG/2ML IJ SOLN: 4.0000 mg | Freq: Three times a day (TID) | INTRAMUSCULAR | Status: DC | PRN

## 2011-08-03 MED ORDER — TRAMADOL HCL 50 MG PO TABS: 50.0000 mg | ORAL_TABLET | Freq: Four times a day (QID) | ORAL | Status: DC | PRN

## 2011-08-03 MED ORDER — MIRTAZAPINE 30 MG PO TABS
30.0000 mg | ORAL_TABLET | Freq: Every day | ORAL | Status: DC
  Administered 2011-08-04: 30 mg via ORAL
  Filled 2011-08-03: qty 1

## 2011-08-03 MED ORDER — SODIUM CHLORIDE 0.9 % IV SOLN: INTRAVENOUS | Status: DC

## 2011-08-03 MED ORDER — ATORVASTATIN CALCIUM 10 MG PO TABS
10.0000 mg | ORAL_TABLET | Freq: Every day | ORAL | Status: DC
  Administered 2011-08-03 – 2011-08-04 (×2): 10 mg via ORAL
  Filled 2011-08-03 (×2): qty 1

## 2011-08-03 MED ORDER — SODIUM CHLORIDE 0.9 % IV BOLUS (SEPSIS)
1000.0000 mL | Freq: Once | INTRAVENOUS | Status: AC
  Administered 2011-08-03: 1000 mL via INTRAVENOUS

## 2011-08-03 MED ORDER — ALUM & MAG HYDROXIDE-SIMETH 200-200-20 MG/5ML PO SUSP: 30.0000 mL | Freq: Four times a day (QID) | ORAL | Status: DC | PRN

## 2011-08-03 MED ORDER — PANTOPRAZOLE SODIUM 40 MG IV SOLR
40.0000 mg | INTRAVENOUS | Status: DC
  Administered 2011-08-04 – 2011-08-05 (×2): 40 mg via INTRAVENOUS
  Filled 2011-08-03 (×2): qty 40

## 2011-08-03 MED ORDER — ONDANSETRON HCL 4 MG/2ML IJ SOLN: 4.0000 mg | Freq: Four times a day (QID) | INTRAMUSCULAR | Status: DC | PRN

## 2011-08-03 MED ORDER — DILTIAZEM HCL ER COATED BEADS 120 MG PO CP24
120.0000 mg | ORAL_CAPSULE | Freq: Every day | ORAL | Status: DC
  Administered 2011-08-03 – 2011-08-05 (×2): 120 mg via ORAL
  Filled 2011-08-03 (×3): qty 1

## 2011-08-03 MED ORDER — VITAMIN K1 10 MG/ML IJ SOLN
2.5000 mg | Freq: Once | INTRAVENOUS | Status: AC
  Administered 2011-08-03: 2.5 mg via INTRAVENOUS
  Filled 2011-08-03: qty 0.25

## 2011-08-03 MED ORDER — ACETAMINOPHEN 325 MG PO TABS: 650.0000 mg | ORAL_TABLET | Freq: Four times a day (QID) | ORAL | Status: DC | PRN

## 2011-08-03 NOTE — ED Provider Notes (Signed)
History   This chart was scribed for Ezequiel Essex, MD by Roe Coombs. The patient was seen in room APA04/APA04. Patient's care was started at 1229.     CSN: [id number]  Arrival date & time 08/03/11  1229   First MD Initiated Contact with Patient 08/03/11 1249      Chief Complaint  Patient presents with  . Rectal Bleeding    (Consider location/radiation/quality/duration/timing/severity/associated sxs/prior treatment) The history is provided by the patient. No language interpreter was used.   [name] [name] is a 78 y.o. female who presents to the Emergency Department complaining of rectal bleeding onset last night. Patient reports that she saw blood after wiping. Patient says that there was blood in the stool that turned toilet water red. Patient denies pain, nausea, vomiting, abdominal pain, chest pain, dizziness, or lightheadness. Patient is currently taking Coumadin. Patient with h/o a-fib, HTN, hypercholesterolemia, acid reflux, CHF, gout, diverticulitis, CKD and hyperglycemia. Patient with surgical h/o colonoscopy. Patient denies h/o hemorrhoids.  PCP - Noreene Larsson [facility])  Past Medical History  Diagnosis Date  . Atrial fibrillation   . Hypertension   . Hypercholesterolemia   . Seasonal allergies   . Acid reflux   . CHF (congestive heart failure)     Hx of diastolic CHF.  Marland Kitchen Gout   . Diverticular hemorrhage     2011  . Thrombocytopenia due to drugs     Allopurinol  . CKD (chronic kidney disease) stage 3, GFR 30-59 ml/min   . Hyperglycemia     Query DM 2  . Chronic anticoagulation   . Diverticulosis 08/03/2011  . Internal hemorrhoids 08/03/2011    Past Surgical History  Procedure Date  . Cesarean section   . Cataract extraction   . Joint replacement     Total right knee 2007  . Appendectomy   . Tubal ligation   . Laparotomy 09/22/2010    Procedure: EXPLORATORY LAPAROTOMY;  Surgeon: [name];  Location: [facility];  Service: General;   Laterality: N/A;  Lysis of Adhesions    History reviewed. No pertinent family history.  History  Substance Use Topics  . Smoking status: Never Smoker   . Smokeless tobacco: Not on file  . Alcohol Use: No    OB History    Grav Para Term Preterm Abortions TAB SAB Ect Mult Living                  Review of Systems A complete 10 system review of systems was obtained and all systems are negative except as noted in the HPI and PMH.   Allergies  Codeine and Sulfa antibiotics  Home Medications   Current Outpatient Rx  Name Route Sig Dispense Refill  . CALCIUM PO Oral Take 1 tablet by mouth daily.    Marland Kitchen CARVEDILOL 25 MG PO TABS Oral Take 25 mg by mouth 2 (two) times daily.      . COLCHICINE 0.6 MG PO TABS Oral Take 0.6 mg by mouth 2 (two) times daily.      Marland Kitchen DILTIAZEM HCL ER 120 MG PO CP24 Oral Take 120 mg by mouth daily.      . FUROSEMIDE 20 MG PO TABS Oral Take 30 mg by mouth 2 (two) times daily as needed. Take 1 and 1/2 tablets daily for fluid     . HYDRALAZINE HCL 25 MG PO TABS Oral Take 25 mg by mouth 3 (three) times daily.    Marland Kitchen LOVASTATIN 40 MG PO TABS Oral  Take 40 mg by mouth at bedtime.      Marland Kitchen MIRTAZAPINE 30 MG PO TABS Oral Take 30 mg by mouth at bedtime.      Marland Kitchen MONTELUKAST SODIUM 10 MG PO TABS Oral Take 10 mg by mouth every morning.      . ADULT MULTIVITAMIN W/MINERALS CH Oral Take 1 tablet by mouth daily.    . TRAMADOL HCL 50 MG PO TABS Oral Take 50 mg by mouth every 6 (six) hours as needed. For pain    . VALSARTAN-HYDROCHLOROTHIAZIDE 320-12.5 MG PO TABS Oral Take 1 tablet by mouth daily.      . WARFARIN SODIUM 1 MG PO TABS Oral Take 1 mg by mouth as directed. Take with 3 mg tablet to make 4 mg    . WARFARIN SODIUM 3 MG PO TABS Oral Take 3 mg by mouth daily.      BP 185/95  Pulse 88  Temp 97.7 F (36.5 C) (Oral)  Resp 20  Ht 5\' 5"  (1.651 m)  Wt 188 lb (85.276 kg)  BMI 31.28 kg/m2  SpO2 98%  Physical Exam  Nursing note and vitals reviewed. Constitutional: She is  oriented to person, place, and time. She appears well-developed and well-nourished.  HENT:  Head: Normocephalic and atraumatic.  Eyes: Conjunctivae and EOM are normal. Pupils are equal, round, and reactive to light.  Neck: Normal range of motion. Neck supple.  Cardiovascular: Normal rate and regular rhythm.   No murmur heard. Pulmonary/Chest: Effort normal and breath sounds normal. No respiratory distress.  Abdominal: Soft. Bowel sounds are normal. She exhibits no distension. There is no tenderness.  Genitourinary: Guaiac positive stool.       External hemorrhoids non-thrombosed.  Musculoskeletal: Normal range of motion.  Neurological: She is alert and oriented to person, place, and time.  Skin: Skin is warm and dry. No pallor.  Psychiatric: She has a normal mood and affect.    ED Course  Procedures (including critical care time) DIAGNOSTIC STUDIES: Oxygen Saturation is 98% on room air, normal by my interpretation.    COORDINATION OF CARE: 1:03PM- Patient informed of current plan for treatment and evaluation and agrees with plan at this time.   Results for orders placed during the hospital encounter of 08/03/11  CBC WITH DIFFERENTIAL      Component Value Range   WBC 4.3  4.0 - 10.5 K/uL   RBC 4.09  3.87 - 5.11 MIL/uL   Hemoglobin 11.1 (*) 12.0 - 15.0 g/dL   HCT 34.7 (*) 36.0 - 46.0 %   MCV 84.8  78.0 - 100.0 fL   MCH 27.1  26.0 - 34.0 pg   MCHC 32.0  30.0 - 36.0 g/dL   RDW 15.7 (*) 11.5 - 15.5 %   Platelets 133 (*) 150 - 400 K/uL   Neutrophils Relative 49  43 - 77 %   Neutro Abs 2.1  1.7 - 7.7 K/uL   Lymphocytes Relative 34  12 - 46 %   Lymphs Abs 1.5  0.7 - 4.0 K/uL   Monocytes Relative 8  3 - 12 %   Monocytes Absolute 0.3  0.1 - 1.0 K/uL   Eosinophils Relative 9 (*) 0 - 5 %   Eosinophils Absolute 0.4  0.0 - 0.7 K/uL   Basophils Relative 1  0 - 1 %   Basophils Absolute 0.0  0.0 - 0.1 K/uL  PROTIME-INR      Component Value Range   Prothrombin Time 34.3 (*) 11.6 - 15.2  seconds   INR 3.33 (*) 0.00 - 1.49  COMPREHENSIVE METABOLIC PANEL      Component Value Range   Sodium 141  135 - 145 mEq/L   Potassium 4.1  3.5 - 5.1 mEq/L   Chloride 107  96 - 112 mEq/L   CO2 21  19 - 32 mEq/L   Glucose, Bld 138 (*) 70 - 99 mg/dL   BUN 73 (*) 6 - 23 mg/dL   Creatinine, Ser 2.01 (*) 0.50 - 1.10 mg/dL   Calcium 9.7  8.4 - 10.5 mg/dL   Total Protein 7.7  6.0 - 8.3 g/dL   Albumin 3.9  3.5 - 5.2 g/dL   AST 18  0 - 37 U/L   ALT 16  0 - 35 U/L   Alkaline Phosphatase 100  39 - 117 U/L   Total Bilirubin 0.3  0.3 - 1.2 mg/dL   GFR calc non Af Amer 22 (*) >90 mL/min   GFR calc Af Amer 26 (*) >90 mL/min  LIPASE, BLOOD      Component Value Range   Lipase 43  11 - 59 U/L  URINALYSIS, ROUTINE W REFLEX MICROSCOPIC      Component Value Range   Color, Urine YELLOW  YELLOW   APPearance CLEAR  CLEAR   Specific Gravity, Urine 1.010  1.005 - 1.030   pH 6.0  5.0 - 8.0   Glucose, UA NEGATIVE  NEGATIVE mg/dL   Hgb urine dipstick TRACE (*) NEGATIVE   Bilirubin Urine NEGATIVE  NEGATIVE   Ketones, ur NEGATIVE  NEGATIVE mg/dL   Protein, ur NEGATIVE  NEGATIVE mg/dL   Urobilinogen, UA 0.2  0.0 - 1.0 mg/dL   Nitrite NEGATIVE  NEGATIVE   Leukocytes, UA NEGATIVE  NEGATIVE  URINE MICROSCOPIC-ADD ON      Component Value Range   Squamous Epithelial / LPF RARE  RARE   WBC, UA 0-2  <3 WBC/hpf   RBC / HPF 0-2  <3 RBC/hpf   Bacteria, UA RARE  RARE   No results found.   1. GI bleeding   2. Warfarin-induced coagulopathy       MDM  2 episodes of BRBPR with blood mixed in stool and toilet water.  No abdominal pain, chest pain, dizziness, lightheadedness.  On coumadin for afib.  Vitals stable, no tachycardia. Orthostatics negative. FOBT positive. Hb 11 which is improved from 8 last year.  INR 3.3 Elevated BUN and Cr.  Question whether BUN elevation is from bleeding.  IVF, protonix Admission discussed with Dr. Caryn Section.     I personally performed the services described in this  documentation, which was scribed in my presence.  The recorded information has been reviewed and considered.    Ezequiel Essex, MD 08/03/11 1606

## 2011-08-03 NOTE — ED Notes (Addendum)
Rectal bleeding onset last pm 2 episodes.  Pt is on coumadin.  Has pain in low back , but says that is chronic.  N o NV.  Says she had been constipated earlier in week.  Pt alert, ,skin warm and dry, color wnl.NAD at  Present.

## 2011-08-03 NOTE — ED Notes (Signed)
Pt in BR when called

## 2011-08-03 NOTE — ED Notes (Signed)
Hemocult positive done by Dr. Wyvonnia Dusky.

## 2011-08-03 NOTE — H&P (Signed)
[name] [name] MRN: [id number] DOB/AGE: 05-Nov-1930 78 y.o. Primary Care Physician: Milus Glazier primary care Admit date: 08/03/2011 Chief Complaint: Rectal bleeding HPI: The patient is a 78 year old woman with a history significant for chronic atrial fibrillation, warfarin coagulopathy, diverticular bleeding, and hypertension, who presents to the emergency department today with a chief complaint of rectal bleeding. She had an episode of rectal bleeding last night. The blood was intermixed with stool. She had another small bloody bowel movement this morning. She denies black tarry stools. She denies nausea, vomiting, abdominal pain or abdominal cramping. She has had constipation for several days. She had planned to take prune juice, but did not because of the bloody stools. She denies chest pain, dizziness, shortness of breath, pain with urination, or recent swelling in her legs. She denies recent fever or chills. She takes Coumadin daily as prescribed. She denies taking aspirin or aspirin-like products.  In the emergency department, she is noted to be hypertensive with a blood pressure 185/95, afebrile, and hemodynamically stable. Her lab data are significant for a glucose of 138, hemoglobin of 11.1, platelet count of 133, BUN of 73, creatinine of 2.01, and an INR of 3.33. She is being admitted for further evaluation and management.    Past Medical History  Diagnosis Date  . Atrial fibrillation   . Hypertension   . Hypercholesterolemia   . Seasonal allergies   . Acid reflux   . CHF (congestive heart failure)     Hx of diastolic CHF.  Marland Kitchen Gout   . Diverticular hemorrhage 2011    Per colonoscopy  . Thrombocytopenia due to drugs     Allopurinol  . CKD (chronic kidney disease) stage 3, GFR 30-59 ml/min   . Hyperglycemia     Query DM 2  . Chronic anticoagulation   . Diverticulosis 2011    Per colonoscopy  . Internal hemorrhoids 2011    Per colonoscopy    Past Surgical History  Procedure  Date  . Cesarean section   . Cataract extraction   . Joint replacement     Total right knee 2007  . Appendectomy   . Tubal ligation   . Laparotomy 09/22/2010    Procedure: EXPLORATORY LAPAROTOMY;  Surgeon: [name];  Location: [facility];  Service: General;  Laterality: N/A;  Lysis of Adhesions    Prior to Admission medications   Medication Sig Start Date End Date Taking? Authorizing Provider  CALCIUM PO Take 1 tablet by mouth daily.   Yes Historical Provider, MD  carvedilol (COREG) 25 MG tablet Take 25 mg by mouth 2 (two) times daily.     Yes Historical Provider, MD  colchicine 0.6 MG tablet Take 0.6 mg by mouth 2 (two) times daily.     Yes Historical Provider, MD  diltiazem (DILACOR XR) 120 MG 24 hr capsule Take 120 mg by mouth daily.     Yes Historical Provider, MD  furosemide (LASIX) 20 MG tablet Take 30 mg by mouth 2 (two) times daily as needed. Take 1 and 1/2 tablets daily for fluid    Yes Historical Provider, MD  hydrALAZINE (APRESOLINE) 25 MG tablet Take 25 mg by mouth 3 (three) times daily.   Yes Historical Provider, MD  lovastatin (MEVACOR) 40 MG tablet Take 40 mg by mouth at bedtime.     Yes Historical Provider, MD  mirtazapine (REMERON) 30 MG tablet Take 30 mg by mouth at bedtime.     Yes Historical Provider, MD  montelukast (SINGULAIR) 10 MG tablet Take  10 mg by mouth every morning.     Yes Historical Provider, MD  Multiple Vitamin (MULTIVITAMIN WITH MINERALS) TABS Take 1 tablet by mouth daily.   Yes Historical Provider, MD  traMADol (ULTRAM) 50 MG tablet Take 50 mg by mouth every 6 (six) hours as needed. For pain   Yes Historical Provider, MD  valsartan-hydrochlorothiazide (DIOVAN-HCT) 320-12.5 MG per tablet Take 1 tablet by mouth daily.     Yes Historical Provider, MD  warfarin (COUMADIN) 1 MG tablet Take 1 mg by mouth as directed. Take with 3 mg tablet to make 4 mg   Yes Historical Provider, MD  warfarin (COUMADIN) 3 MG tablet Take 3 mg by mouth daily. 10/01/10  Yes Delfina Redwood, MD    Allergies:  Allergies  Allergen Reactions  . Codeine Nausea And Vomiting  . Sulfa Antibiotics Nausea And Vomiting    Family history: Both of her parents died of heart disease. She cannot tell me specifically what type of heart disease.  Social History: She is widowed. She lives in [place] alone. She has 3 adult children. She is retired from a [employer] officer, trade [employer]. She no longer drives. She denies tobacco and alcohol use. She denies illicit drug use.     ROS: Positive for low back pain and chronic left shoulder immobility and pain; positive for what is mentioned in the history of present illness. Otherwise, review of systems is negative.  PHYSICAL EXAM: Blood pressure 185/95, pulse 88, temperature 97.7 F (36.5 C), temperature source Oral, resp. rate 20, height 5\' 5"  (1.651 m), weight 85.276 kg (188 lb), SpO2 98.00%.  General: Pleasant alert 78 year old African- American woman who is sitting up in bed, in no acute distress. HEENT: Head is normocephalic, nontraumatic. Pupils are equal, round, and reactive to light. Extraocular was are intact. Conjunctivae are clear. Sclerae are white. Oropharynx reveals mildly dry mucous membranes. No posterior exudates or erythema. Neck: Supple, no adenopathy, no thyromegaly, no JVD. Lungs: Decreased breath sounds at the bases otherwise clear. Breathing is nonlabored. Heart: Irregular, irregular. Abdomen: Well-healed vertical abdominal scar. Bowel sounds are present. Mildly obese, nontender, nondistended. No masses palpated. No hepatosplenomegaly. GU deferred Rectal deferred. According to the emergency department physician Dr. Melanee Left, her stool was grossly bloody. Extremities: Pedal pulses palpable bilaterally. No pretibial edema and no pedal edema. Neurologic: She is alert and oriented x3. Cranial nerves II through XII are intact. Strength is 5 over 5 throughout. Sensation is grossly intact. Psychiatric: She has a pleasant affect. She  is cooperative. Her speech is clear.  Basic Metabolic Panel:  Basename 08/03/11 1316  NA 141  K 4.1  CL 107  CO2 21  GLUCOSE 138*  BUN 73*  CREATININE 2.01*  CALCIUM 9.7  MG --  PHOS --   Liver Function Tests:  Basename 08/03/11 1316  AST 18  ALT 16  ALKPHOS 100  BILITOT 0.3  PROT 7.7  ALBUMIN 3.9    Basename 08/03/11 1316  LIPASE 43  AMYLASE --   No results found for this basename: AMMONIA:2 in the last 72 hours CBC:  Basename 08/03/11 1316  WBC 4.3  NEUTROABS 2.1  HGB 11.1*  HCT 34.7*  MCV 84.8  PLT 133*   Cardiac Enzymes: No results found for this basename: CKTOTAL:3,CKMB:3,CKMBINDEX:3,TROPONINI:3 in the last 72 hours BNP: No results found for this basename: PROBNP:3 in the last 72 hours D-Dimer: No results found for this basename: DDIMER:2 in the last 72 hours CBG: No results found for this basename: GLUCAP:6 in  the last 72 hours Hemoglobin A1C: No results found for this basename: HGBA1C in the last 72 hours Fasting Lipid Panel: No results found for this basename: CHOL,HDL,LDLCALC,TRIG,CHOLHDL,LDLDIRECT in the last 72 hours Thyroid Function Tests: No results found for this basename: TSH,T4TOTAL,FREET4,T3FREE,THYROIDAB in the last 72 hours Anemia Panel: No results found for this basename: VITAMINB12,FOLATE,FERRITIN,TIBC,IRON,RETICCTPCT in the last 72 hours Coagulation:  Basename 08/03/11 1316  LABPROT 34.3*  INR 3.33*   Urine Drug Screen: Drugs of Abuse  No results found for this basename: labopia, cocainscrnur, labbenz, amphetmu, thcu, labbarb    Alcohol Level: No results found for this basename: ETH:2 in the last 72 hours Urinalysis:  Basename 08/03/11 1400  COLORURINE YELLOW  LABSPEC 1.010  PHURINE 6.0  GLUCOSEU NEGATIVE  HGBUR TRACE*  BILIRUBINUR NEGATIVE  KETONESUR NEGATIVE  PROTEINUR NEGATIVE  UROBILINOGEN 0.2  NITRITE NEGATIVE  Trevose. Labs:   No results found for this or any previous visit (from  the past 240 hour(s)).   Results for orders placed during the hospital encounter of 08/03/11 (from the past 48 hour(s))  CBC WITH DIFFERENTIAL     Status: Abnormal   Collection Time   08/03/11  1:16 PM      Component Value Range Comment   WBC 4.3  4.0 - 10.5 K/uL    RBC 4.09  3.87 - 5.11 MIL/uL    Hemoglobin 11.1 (*) 12.0 - 15.0 g/dL    HCT 34.7 (*) 36.0 - 46.0 %    MCV 84.8  78.0 - 100.0 fL    MCH 27.1  26.0 - 34.0 pg    MCHC 32.0  30.0 - 36.0 g/dL    RDW 15.7 (*) 11.5 - 15.5 %    Platelets 133 (*) 150 - 400 K/uL    Neutrophils Relative 49  43 - 77 %    Neutro Abs 2.1  1.7 - 7.7 K/uL    Lymphocytes Relative 34  12 - 46 %    Lymphs Abs 1.5  0.7 - 4.0 K/uL    Monocytes Relative 8  3 - 12 %    Monocytes Absolute 0.3  0.1 - 1.0 K/uL    Eosinophils Relative 9 (*) 0 - 5 %    Eosinophils Absolute 0.4  0.0 - 0.7 K/uL    Basophils Relative 1  0 - 1 %    Basophils Absolute 0.0  0.0 - 0.1 K/uL   PROTIME-INR     Status: Abnormal   Collection Time   08/03/11  1:16 PM      Component Value Range Comment   Prothrombin Time 34.3 (*) 11.6 - 15.2 seconds    INR 3.33 (*) 0.00 - 1.49   COMPREHENSIVE METABOLIC PANEL     Status: Abnormal   Collection Time   08/03/11  1:16 PM      Component Value Range Comment   Sodium 141  135 - 145 mEq/L    Potassium 4.1  3.5 - 5.1 mEq/L    Chloride 107  96 - 112 mEq/L    CO2 21  19 - 32 mEq/L    Glucose, Bld 138 (*) 70 - 99 mg/dL    BUN 73 (*) 6 - 23 mg/dL    Creatinine, Ser 2.01 (*) 0.50 - 1.10 mg/dL    Calcium 9.7  8.4 - 10.5 mg/dL    Total Protein 7.7  6.0 - 8.3 g/dL    Albumin 3.9  3.5 - 5.2 g/dL    AST 18  0 - 37 U/L    ALT 16  0 - 35 U/L    Alkaline Phosphatase 100  39 - 117 U/L    Total Bilirubin 0.3  0.3 - 1.2 mg/dL    GFR calc non Af Amer 22 (*) >90 mL/min    GFR calc Af Amer 26 (*) >90 mL/min   LIPASE, BLOOD     Status: Normal   Collection Time   08/03/11  1:16 PM      Component Value Range Comment   Lipase 43  11 - 59 U/L   URINALYSIS,  ROUTINE W REFLEX MICROSCOPIC     Status: Abnormal   Collection Time   08/03/11  2:00 PM      Component Value Range Comment   Color, Urine YELLOW  YELLOW    APPearance CLEAR  CLEAR    Specific Gravity, Urine 1.010  1.005 - 1.030    pH 6.0  5.0 - 8.0    Glucose, UA NEGATIVE  NEGATIVE mg/dL    Hgb urine dipstick TRACE (*) NEGATIVE    Bilirubin Urine NEGATIVE  NEGATIVE    Ketones, ur NEGATIVE  NEGATIVE mg/dL    Protein, ur NEGATIVE  NEGATIVE mg/dL    Urobilinogen, UA 0.2  0.0 - 1.0 mg/dL    Nitrite NEGATIVE  NEGATIVE    Leukocytes, UA NEGATIVE  NEGATIVE   URINE MICROSCOPIC-ADD ON     Status: Normal   Collection Time   08/03/11  2:00 PM      Component Value Range Comment   Squamous Epithelial / LPF RARE  RARE    WBC, UA 0-2  <3 WBC/hpf    RBC / HPF 0-2  <3 RBC/hpf    Bacteria, UA RARE  RARE      Impression:  Principal Problem:  *GI bleeding Active Problems:  ARF (acute renal failure)  Anemia  HTN (hypertension), malignant  Chronic a-fib  Warfarin anticoagulation  Thrombocytopenia  Diverticulosis  Internal hemorrhoids   1. GI bleeding. Given her history and clinical scenario, it is likely that she has a lower GI bleed. She has a history of diverticular bleeding in July of 2011. She also has a history of large internal hemorrhoids. The bleeding is in the setting of anticoagulation with warfarin. She is hemodynamically stable.  2. Mild normocytic anemia. She has anemia dating back to GI bleeding and 2011. It is likely that her hemoglobin will decrease with IV fluid hydration.  3. Chronic thrombocytopenia. In the past, this was associated with allopurinol.  4. Chronic atrial fibrillation with chronic warfarin anticoagulation. Her INR is slightly supratherapeutic. Her rate is controlled.  5. Hypertension, malignant. She is treated chronically with carvedilol, diltiazem, hydralazine, and valsartan/HCTZ. She has not taken all of her antihypertensive medications this morning. The  valsartan/HCTZ will be held due to to acute renal failure.  6. Acute renal failure. This is likely secondary to volume depletion and GI bleeding in the setting of diuretic therapy and ARB therapy. In August of 2012, her creatinine was 0.60 and her BUN was 10.  7. History of diastolic congestive heart failure. Compensated clinically.  8. Hyperglycemia. The patient may have a history of borderline diabetes mellitus.  Plan: 1. The patient was given IV fluids in the emergency department. We'll continue hydration with half-normal saline. 2. Hold the Coumadin. We'll give 2.5 mg of vitamin K intravenously. We'll follow her INR/PT daily. 3. Gastroenterologist, Dr. Gala Romney has been consulted. 4. We'll monitor her hemoglobin/hematocrit (CBC) every 6 hours. 5.  We'll continue all of her antihypertensive medications with the exception of valsartan-HCTZ in the setting of volume depletion and acute renal failure. We'll also hold furosemide. 6. For further evaluation, we'll check a vitamin B12 level, TSH, and daily INR.      Shemuel Harkleroad 08/03/2011, 4:06 PM

## 2011-08-03 NOTE — ED Notes (Signed)
Pt showing A fib on cardiac monitor. VSS. IV infusing with no edema or redness.

## 2011-08-03 NOTE — ED Notes (Signed)
Waiting on bed on floor. Resting with no complaints.

## 2011-08-03 NOTE — ED Notes (Signed)
Report given to floor. Pt being transferred to 303 via stretcher with portable monitor.

## 2011-08-03 NOTE — ED Notes (Signed)
Pt c/o bright red bleeding from rectum with her BM last night and this morning. Pt states that she has been constipated for a week with small amounts of hard stool coming out a little at a time. Alert and oriented x 3. Skin warm and dry. Color pink. Breath sounds clear and equal bilaterally. No c/o pain at this time. Family at bedside.

## 2011-08-04 DIAGNOSIS — K922 Gastrointestinal hemorrhage, unspecified: Secondary | ICD-10-CM

## 2011-08-04 DIAGNOSIS — R001 Bradycardia, unspecified: Secondary | ICD-10-CM | POA: Diagnosis not present

## 2011-08-04 DIAGNOSIS — N179 Acute kidney failure, unspecified: Secondary | ICD-10-CM

## 2011-08-04 DIAGNOSIS — R000 Tachycardia, unspecified: Secondary | ICD-10-CM

## 2011-08-04 DIAGNOSIS — T45515A Adverse effect of anticoagulants, initial encounter: Secondary | ICD-10-CM

## 2011-08-04 LAB — CBC
HCT: 32.8 % — ABNORMAL LOW (ref 36.0–46.0)
HCT: 32 % — ABNORMAL LOW (ref 36.0–46.0)
Hemoglobin: 10.6 g/dL — ABNORMAL LOW (ref 12.0–15.0)
Hemoglobin: 10.5 g/dL — ABNORMAL LOW (ref 12.0–15.0)
MCH: 27.4 pg (ref 26.0–34.0)
MCH: 27.8 pg (ref 26.0–34.0)
MCHC: 32.3 g/dL (ref 30.0–36.0)
MCHC: 32.8 g/dL (ref 30.0–36.0)
MCV: 84.8 fL (ref 78.0–100.0)
MCV: 84.7 fL (ref 78.0–100.0)
Platelets: 113 10*3/uL — ABNORMAL LOW (ref 150–400)
Platelets: 113 10*3/uL — ABNORMAL LOW (ref 150–400)
RBC: 3.87 MIL/uL (ref 3.87–5.11)
RBC: 3.78 MIL/uL — ABNORMAL LOW (ref 3.87–5.11)
RDW: 15.9 % — ABNORMAL HIGH (ref 11.5–15.5)
RDW: 15.9 % — ABNORMAL HIGH (ref 11.5–15.5)
WBC: 7.4 10*3/uL (ref 4.0–10.5)
WBC: 4 10*3/uL (ref 4.0–10.5)

## 2011-08-04 LAB — COMPREHENSIVE METABOLIC PANEL
ALT: 13 U/L (ref 0–35)
AST: 16 U/L (ref 0–37)
Albumin: 3.5 g/dL (ref 3.5–5.2)
Alkaline Phosphatase: 66 U/L (ref 39–117)
BUN: 51 mg/dL — ABNORMAL HIGH (ref 6–23)
CO2: 20 mEq/L (ref 19–32)
Calcium: 9.1 mg/dL (ref 8.4–10.5)
Chloride: 113 mEq/L — ABNORMAL HIGH (ref 96–112)
Creatinine, Ser: 1.51 mg/dL — ABNORMAL HIGH (ref 0.50–1.10)
GFR calc Af Amer: 37 mL/min — ABNORMAL LOW (ref >90)
GFR calc non Af Amer: 32 mL/min — ABNORMAL LOW (ref >90)
Glucose, Bld: 92 mg/dL (ref 70–99)
Potassium: 3.8 mEq/L (ref 3.5–5.1)
Sodium: 144 mEq/L (ref 135–145)
Total Bilirubin: 0.7 mg/dL (ref 0.3–1.2)
Total Protein: 6.7 g/dL (ref 6.0–8.3)

## 2011-08-04 LAB — PROTIME-INR
INR: 2.24 — ABNORMAL HIGH (ref 0.00–1.49)
Prothrombin Time: 25.2 seconds — ABNORMAL HIGH (ref 11.6–15.2)

## 2011-08-04 LAB — TSH: TSH: 4.879 u[IU]/mL — ABNORMAL HIGH (ref 0.350–4.500)

## 2011-08-04 LAB — APTT: aPTT: 57 seconds — ABNORMAL HIGH (ref 24–37)

## 2011-08-04 LAB — VITAMIN B12: Vitamin B-12: 786 pg/mL (ref 211–911)

## 2011-08-04 NOTE — Consult Note (Signed)
i

## 2011-08-04 NOTE — Progress Notes (Signed)
Notifed Dr. Megan Salon of patient's heart rate in the 40's a.fib.  Patient stable and asymptomatic with no complaints.  Patient received blood pressure medicine before shift started.  Received no new orders.  Will continue to monitor patient.

## 2011-08-04 NOTE — Progress Notes (Signed)
Subjective: The patient had a bowel movement last night and this morning with no visible blood. No complaints of chest pain or shortness of breath. She has noticed a little wheezing this morning.  Objective: Vital signs in last 24 hours: Filed Vitals:   08/03/11 1727 08/03/11 2212 08/04/11 0611 08/04/11 0823  BP: 156/76 104/63 132/65 126/74  Pulse: 76 44 65 67  Temp: 98 F (36.7 C) 96.8 F (36 C) 97.4 F (36.3 C)   TempSrc: Oral Axillary Oral   Resp: 20 18 18 18   Height: 5\' 5"  (1.651 m)     Weight: 86.3 kg (190 lb 4.1 oz)     SpO2: 97% 94% 97% 94%    Intake/Output Summary (Last 24 hours) at 08/04/11 0935 Last data filed at 08/04/11 0815  Gross per 24 hour  Intake 1683.33 ml  Output      0 ml  Net 1683.33 ml    Weight change:   Physical exam: General: Pleasant 78 year old African- American woman lying in bed, in no acute distress. Lungs: Occasional upper airway faint wheezes, clear in the mid lobes and bases. Heart: Irregular, irregular with soft systolic murmur. Abdomen: Mildly obese, positive bowel sounds, soft, nontender, nondistended. Extremities: No pedal edema. Neurologic: She is alert and oriented x3. Cranial nerves II through XII are intact.   Lab Results: Basic Metabolic Panel:  Basename 08/04/11 0429 08/03/11 1316  NA 144 141  K 3.8 4.1  CL 113* 107  CO2 20 21  GLUCOSE 92 138*  BUN 51* 73*  CREATININE 1.51* 2.01*  CALCIUM 9.1 9.7  MG -- --  PHOS -- --   Liver Function Tests:  Basename 08/04/11 0429 08/03/11 1316  AST 16 18  ALT 13 16  ALKPHOS 66 100  BILITOT 0.7 0.3  PROT 6.7 7.7  ALBUMIN 3.5 3.9    Basename 08/03/11 1316  LIPASE 43  AMYLASE --   No results found for this basename: AMMONIA:2 in the last 72 hours CBC:  Basename 08/04/11 0710 08/04/11 0035 08/03/11 1316  WBC 4.0 7.4 --  NEUTROABS -- -- 2.1  HGB 10.5* 10.6* --  HCT 32.0* 32.8* --  MCV 84.7 84.8 --  PLT 113* 113* --   Cardiac Enzymes: No results found for this  basename: CKTOTAL:3,CKMB:3,CKMBINDEX:3,TROPONINI:3 in the last 72 hours BNP: No results found for this basename: PROBNP:3 in the last 72 hours D-Dimer: No results found for this basename: DDIMER:2 in the last 72 hours CBG: No results found for this basename: GLUCAP:6 in the last 72 hours Hemoglobin A1C: No results found for this basename: HGBA1C in the last 72 hours Fasting Lipid Panel: No results found for this basename: CHOL,HDL,LDLCALC,TRIG,CHOLHDL,LDLDIRECT in the last 72 hours Thyroid Function Tests:  Basename 08/03/11 1316  TSH 4.879*  T4TOTAL --  FREET4 --  T3FREE --  THYROIDAB --   Anemia Panel:  Basename 08/03/11 1316  VITAMINB12 786  FOLATE --  FERRITIN --  TIBC --  IRON --  RETICCTPCT --   Coagulation:  Basename 08/04/11 0429 08/03/11 1316  LABPROT 25.2* 34.3*  INR 2.24* 3.33*   Urine Drug Screen: Drugs of Abuse  No results found for this basename: labopia, cocainscrnur, labbenz, amphetmu, thcu, labbarb    Alcohol Level: No results found for this basename: ETH:2 in the last 72 hours Urinalysis:  Basename 08/03/11 1400  COLORURINE YELLOW  LABSPEC 1.010  PHURINE 6.0  GLUCOSEU NEGATIVE  HGBUR TRACE*  BILIRUBINUR NEGATIVE  KETONESUR NEGATIVE  PROTEINUR NEGATIVE  UROBILINOGEN 0.2  NITRITE NEGATIVE  LEUKOCYTESUR NEGATIVE   Misc. Labs:  Micro: No results found for this or any previous visit (from the past 240 hour(s)).  Studies/Results: No results found.  Medications:  Scheduled:   . atorvastatin  10 mg Oral q1800  . carvedilol  25 mg Oral BID WC  . diltiazem  120 mg Oral Daily  . hydrALAZINE  25 mg Oral TID  . mirtazapine  30 mg Oral QHS  . montelukast  10 mg Oral Daily  . pantoprazole (PROTONIX) IV  40 mg Intravenous Once  . pantoprazole (PROTONIX) IV  40 mg Intravenous Q24H  . phytonadione (VITAMIN K) IV  2.5 mg Intravenous Once  . sodium chloride  1,000 mL Intravenous Once  . DISCONTD: sodium chloride   Intravenous STAT  .  DISCONTD: montelukast  10 mg Oral BH-q7a  . DISCONTD: simvastatin  20 mg Oral q1800   Continuous:   . 0.9 % NaCl with KCl 20 mEq / L 100 mL/hr at 08/04/11 [id number]   [id number], acetaminophen, alum & mag hydroxide-simeth, levalbuterol, ondansetron (ZOFRAN) IV, ondansetron, traMADol, DISCONTD: ondansetron (ZOFRAN) IV  Assessment: Principal Problem:  *GI bleeding Active Problems:  ARF (acute renal failure)  Anemia  HTN (hypertension), malignant  Chronic a-fib  Warfarin anticoagulation  Thrombocytopenia  Diverticulosis  Internal hemorrhoids  Hyperglycemia  Bradycardia   1. GI bleeding, likely lower GI bleeding. She has a history of diverticular bleeding in July of 2011. She also has a history of large internal hemorrhoids. The bleeding was in the setting of anticoagulation with warfarin. Her last 2 bowel movements were not grossly bloody by her admission. She remained hemodynamically stable. GI consult pending.  Anemia, secondary to blood loss and hemodilution from IV fluids. Her hemoglobin and hematocrit will continue to be monitored.  Thrombocytopenia. The patient's thrombocytopenia appears to be chronic and related to allopurinol use in the past. Her vitamin B12 level is within normal limits. Her TSH is slightly elevated at 4.8.  Chronic warfarin coagulopathy and chronic atrial fibrillation. Status post 2.5 mg of IV vitamin K. Her INR has decreased from 3.3-2.24. Her rate is controlled and intermittently bradycardic secondary to beta blocker and calcium channel blocker.  Malignant hypertension. Overall better this morning. We'll continue hydralazine, carvedilol, and diltiazem. We'll place hold parameters on carvedilol and the talus M4 bradycardia. Holding valsartan-HCTZ for acute renal failure.  Acute renal failure; secondary to prerenal azotemia and blood loss. Slightly improving on IV fluid hydration.  History of diastolic congestive heart failure. She does not appear to be  volume overloaded although she does have a few upper airway wheezes. Peripherally, there is no edema noted.  Hyperglycemia. Currently resolved. Hemoglobin A1c pending.   Slightly elevated TSH. We'll check a free T4.  Plan:  1. GI consult pending. We'll keep the patient on a clear liquid diet until further recommendations by GI. 2. Decreased IV fluids to 60 cc an hour. 3. We'll place hold parameters on diltiazem and carvedilol for heart rate of less than 58 beats per minute. 4. We'll order free T4. 5. Continue to monitor CBC and INR daily. We'll continue to hold Coumadin for now.   LOS: 1 day   Karisma Meiser 08/04/2011, 9:35 AM

## 2011-08-04 NOTE — Consult Note (Signed)
Referring Provider:   Dr. Caryn Section Primary Care Physician:  [facility] Gl[facility]ier [facility] Primary Gastroenterologist:  Dr.Fields  Reason for Consultation:  Hematochezia  HPI:   [name] 78 year old [name] admitted to the hospital yesterday after having a couple episodes of gross rectal bleeding in the setting of chronic constipation. Hemodynamically stable throughout her course thus far.  Stools today look normal. No associated abdominal pain. No upper GI tract symptoms such as odynophagia, dysphagia, early satiety, nausea, vomiting or melena. GERD symptoms well controlled on Protonix. On Coumadin for atrial fibrillation. INR on presentation she greater than 3. Overnight her hemoglobin has dropped from 11 to10.5. Anticoagulation has been reversed with vitamin K. Similar presentation a little less than 2 years ago for she underwent both an EGD and colonoscopy by Dr. Oneida Alar. She was found to have some old blood in her colon in the setting of pancolonic diverticula and large internal hemorrhoids. EGD demonstrated some gastritis.  Past Medical History  Diagnosis Date  . Atrial fibrillation   . Hypertension   . Hypercholesterolemia   . Seasonal allergies   . Acid reflux   . CHF (congestive heart failure)     Hx of diastolic CHF.  Marland Kitchen Gout   . Diverticular hemorrhage 2011    Per colonoscopy  . Thrombocytopenia due to drugs     Allopurinol  . CKD (chronic kidney disease) stage 3, GFR 30-59 ml/min   . Hyperglycemia     Query DM 2  . Chronic anticoagulation   . Diverticulosis 2011    Per colonoscopy  . Internal hemorrhoids 2011    Per colonoscopy    Past Surgical History  Procedure Date  . Cesarean section   . Cataract extraction   . Joint replacement     Total right knee 2007  . Appendectomy   . Tubal ligation   . Laparotomy 09/22/2010    Procedure: EXPLORATORY LAPAROTOMY;  Surgeon: [name];  Location: [facility];  Service: General;  Laterality: N/A;  Lysis of Adhesions    Prior to Admission  medications   Medication Sig Start Date End Date Taking? Authorizing Provider  CALCIUM PO Take 1 tablet by mouth daily.   Yes Historical Provider, MD  carvedilol (COREG) 25 MG tablet Take 25 mg by mouth 2 (two) times daily.     Yes Historical Provider, MD  colchicine 0.6 MG tablet Take 0.6 mg by mouth 2 (two) times daily.     Yes Historical Provider, MD  diltiazem (DILACOR XR) 120 MG 24 hr capsule Take 120 mg by mouth daily.     Yes Historical Provider, MD  furosemide (LASIX) 20 MG tablet Take 30 mg by mouth 2 (two) times daily as needed. Take 1 and 1/2 tablets daily for fluid    Yes Historical Provider, MD  hydrALAZINE (APRESOLINE) 25 MG tablet Take 25 mg by mouth 3 (three) times daily.   Yes Historical Provider, MD  lovastatin (MEVACOR) 40 MG tablet Take 40 mg by mouth at bedtime.     Yes Historical Provider, MD  mirtazapine (REMERON) 30 MG tablet Take 30 mg by mouth at bedtime.     Yes Historical Provider, MD  montelukast (SINGULAIR) 10 MG tablet Take 10 mg by mouth every morning.     Yes Historical Provider, MD  Multiple Vitamin (MULTIVITAMIN WITH MINERALS) TABS Take 1 tablet by mouth daily.   Yes Historical Provider, MD  traMADol (ULTRAM) 50 MG tablet Take 50 mg by mouth every 6 (six) hours as needed. For pain  Yes Historical Provider, MD  valsartan-hydrochlorothiazide (DIOVAN-HCT) 320-12.5 MG per tablet Take 1 tablet by mouth daily.     Yes Historical Provider, MD  warfarin (COUMADIN) 1 MG tablet Take 1 mg by mouth as directed. Take with 3 mg tablet to make 4 mg   Yes Historical Provider, MD  warfarin (COUMADIN) 3 MG tablet Take 3 mg by mouth daily. 10/01/10  Yes Delfina Redwood, MD    Current Facility-Administered Medications  Medication Dose Route Frequency Provider Last Rate Last Dose  . 0.9 % NaCl with KCl 20 mEq/ L  infusion   Intravenous Continuous Rexene Alberts, MD 60 mL/hr at 08/04/11 1107    . acetaminophen (TYLENOL) tablet 650 mg  650 mg Oral Q6H PRN Rexene Alberts, MD        Or  . acetaminophen (TYLENOL) suppository 650 mg  650 mg Rectal Q6H PRN Rexene Alberts, MD      . alum & mag hydroxide-simeth (MAALOX/MYLANTA) 200-200-20 MG/5ML suspension 30 mL  30 mL Oral Q6H PRN Rexene Alberts, MD      . atorvastatin (LIPITOR) tablet 10 mg  10 mg Oral q1800 Rexene Alberts, MD   10 mg at 08/03/11 1758  . carvedilol (COREG) tablet 25 mg  25 mg Oral BID WC Rexene Alberts, MD   25 mg at 08/04/11 0826  . diltiazem (CARDIZEM CD) 24 hr capsule 120 mg  120 mg Oral Daily Rexene Alberts, MD   120 mg at 08/03/11 1758  . hydrALAZINE (APRESOLINE) tablet 25 mg  25 mg Oral TID Rexene Alberts, MD   25 mg at 08/03/11 1758  . levalbuterol (XOPENEX) nebulizer solution 0.63 mg  0.63 mg Nebulization Q6H PRN Rexene Alberts, MD      . mirtazapine (REMERON) tablet 30 mg  30 mg Oral QHS Rexene Alberts, MD      . montelukast (SINGULAIR) tablet 10 mg  10 mg Oral Daily Rexene Alberts, MD   10 mg at 08/04/11 1107  . ondansetron (ZOFRAN) tablet 4 mg  4 mg Oral Q6H PRN Rexene Alberts, MD       Or  . ondansetron [facility]) injection 4 mg  4 mg Intravenous Q6H PRN Rexene Alberts, MD      . pantoprazole (PROTONIX) injection 40 mg  40 mg Intravenous Once Ezequiel Essex, MD   40 mg at 08/03/11 1422  . pantoprazole (PROTONIX) injection 40 mg  40 mg Intravenous Q24H Rexene Alberts, MD   40 mg at 08/04/11 0826  . phytonadione (VITAMIN K) 2.5 mg in dextrose 5 % 50 mL IVPB  2.5 mg Intravenous Once Rexene Alberts, MD   2.5 mg at 08/03/11 1628  . sodium chloride 0.9 % bolus 1,000 mL  1,000 mL Intravenous Once Ezequiel Essex, MD   1,000 mL at 08/03/11 1354  . traMADol (ULTRAM) tablet 50 mg  50 mg Oral Q6H PRN Rexene Alberts, MD      . DISCONTD: 0.9 %  sodium chloride infusion   Intravenous STAT Ezequiel Essex, MD      . DISCONTD: montelukast (SINGULAIR) tablet 10 mg  10 mg Oral Augustin Coupe, MD      . DISCONTD: ondansetron [facility]) injection 4 mg  4 mg Intravenous Q8H PRN Ezequiel Essex, MD      . DISCONTD: simvastatin  (ZOCOR) tablet 20 mg  20 mg Oral q1800 Rexene Alberts, MD        Allergies as of 08/03/2011 - Review Complete 08/03/2011  Allergen Reaction Noted  . Codeine Nausea And Vomiting  09/16/2010  . Sulfa antibiotics Nausea And Vomiting 09/16/2010    History reviewed. No pertinent family history.  History   Social History  . Marital Status: Widowed    Spouse Name: N/A    Number of Children: N/A  . Years of Education: N/A   Occupational History  . Not on file.   Social History Main Topics  . Smoking status: Never Smoker   . Smokeless tobacco: Not on file  . Alcohol Use: No  . Drug Use: No  . Sexually Active:    Other Topics Concern  . Not on file   Social History Narrative  . No narrative on file    Review of Systems: Gen: Denies any fever, chills, sweats, anorexia, weakness, malaise, weight loss, and sleep disorder CV: Denies chest pain, angina, palpitations, syncope, orthopnea, PND,  Resp: , cough, sputum, wheezing, coughing up blood, and pleurisy. GI: Denies vomiting blood, jaundice, and fecal incontinence.   Denies dysphagia or odynophagia. Derm: Denies rash, itching, dry skin, hives, moles, warts, or unhealing ulcers.  Psych: Denies depression, anxiety, memory loss, suicidal ideation, hallucinations, paranoia, and confusion. Heme:  enlarged lymph nodes.   Physical Exam: Vital signs in last 24 hours: Temp:  [96.8 F (36 C)-98 F (36.7 C)] 97.4 F (36.3 C) (06/29 0611) Pulse Rate:  [44-88] 58  (06/29 1104) Resp:  [18-20] 18  (06/29 1104) BP: (104-185)/(63-95) 108/65 mmHg (06/29 1104) SpO2:  [94 %-97 %] 96 % (06/29 1104) Weight:  [190 lb 4.1 oz (86.3 kg)] 190 lb 4.1 oz (86.3 kg) (06/28 1727) Last BM Date: 08/03/11 General:    [name] elderly [name] alert conversant   Alert,  Well-developed, well-nourished, [name] and cooperative in NAD Head:  Normocephalic and atraumatic. Eyes:  Sclera clear, no icterus.   Conjunctiva pink. Ears:  Normal auditory acuity. Nose:   No deformity, discharge,  or lesions. Mouth:  No deformity or lesions, dentition normal. Neck:  Supple; no masses or thyromegaly. Lungs:  Clear throughout to auscultation.   No wheezes, crackles, or rhonchi. No acute distress. Heart:  irregularly irregular  rhythm; no murmurs, clicks, rubs,  or gallops. Abdomen:  Soft, nontender and nondistended. No masses, hepatosplenomegaly or hernias noted. Normal bowel sounds, without guarding, and without rebound.   Msk:  Symmetrical without gross deformities. Normal posture. Pulses:  Normal pulses noted. Extremities:  Without clubbing or edema. Neurologic:  Alert and  oriented x4;  grossly normal neurologically. Skin:  Intact without significant lesions or rashes. Cervical Nodes:  No significant cervical adenopathy. Psych:  Alert and cooperative. Normal mood and affect.  Intake/Output from previous day: 06/28 0701 - 06/29 0700 In: 1283.3 [I.V.:1283.3] Out: -  Intake/Output this shift: Total I/O In: 400 [P.O.:400] Out: -   Lab Results:  Basename 08/04/11 0710 08/04/11 0035 08/03/11 1831  WBC 4.0 7.4 4.4  HGB 10.5* 10.6* 11.0*  HCT 32.0* 32.8* 33.9*  PLT 113* 113* 108*   BMET  Basename 08/04/11 0429 08/03/11 1316  NA 144 141  K 3.8 4.1  CL 113* 107  CO2 20 21  GLUCOSE 92 138*  BUN 51* 73*  CREATININE 1.51* 2.01*  CALCIUM 9.1 9.7   LFT  Basename 08/04/11 0429  PROT 6.7  ALBUMIN 3.5  AST 16  ALT 13  ALKPHOS 66  BILITOT 0.7  BILIDIR --  IBILI --   PT/INR  Basename 08/04/11 0429 08/03/11 1316  LABPROT 25.2* 34.3*  INR 2.24* 3.33*   LOS: 1 day     Impression:  [name] [name] presents with a couple of episodes of low volume painless hematochezia in the setting of a supratherapeutic INR. She's remained hemodynamically stable. She's had a modest drop in her hemoglobin in the setting of pre-existing anemia(which is likely multifactorial in etiology) . Her stools have normalized during the first 24 hours of  hospitalization. Just a little less than 2 years ago she underwent both an EGD and colonoscopy for similar presentation. She did have large internal hemorrhoids and pancolonic diverticulosis.  I suspect a relatively trivial lower GI bleed of benign anorectal origin (hemorrhoids)  in the setting of constipation and a supratherapeutic INR. I do not feel that endoscopic evaluation is needed at this time.   Recommendations:   Advance diet. Begin a bowel regimen in the way of Benefiber 2 tablespoons daily. Utilize MiraLax 17 g orally at bedtime nightly on any given day without a bowel movement.  If continued anticoagulation felt to be necessary, would resume after holding for 1 week, maintaining an INR in the 2-2.5  range.  If bleeding recurs, she will need to get back to Dr. Oneida Alar for consideration of further evaluation. GERD symptoms stable-continue proton pump inhibitor therapy.  I'd  like to thank Dr. Caryn Section for allowing me to see this nice [name] today.    Manus Rudd  08/04/2011, 12:49 PM

## 2011-08-05 ENCOUNTER — Encounter (HOSPITAL_COMMUNITY): Payer: Self-pay | Admitting: Internal Medicine

## 2011-08-05 DIAGNOSIS — N179 Acute kidney failure, unspecified: Secondary | ICD-10-CM

## 2011-08-05 DIAGNOSIS — K922 Gastrointestinal hemorrhage, unspecified: Secondary | ICD-10-CM

## 2011-08-05 DIAGNOSIS — R000 Tachycardia, unspecified: Secondary | ICD-10-CM

## 2011-08-05 DIAGNOSIS — T45515A Adverse effect of anticoagulants, initial encounter: Secondary | ICD-10-CM

## 2011-08-05 LAB — BASIC METABOLIC PANEL
BUN: 36 mg/dL — ABNORMAL HIGH (ref 6–23)
CO2: 19 mEq/L (ref 19–32)
Calcium: 8.9 mg/dL (ref 8.4–10.5)
Chloride: 114 mEq/L — ABNORMAL HIGH (ref 96–112)
Creatinine, Ser: 1.36 mg/dL — ABNORMAL HIGH (ref 0.50–1.10)
GFR calc Af Amer: 42 mL/min — ABNORMAL LOW (ref >90)
GFR calc non Af Amer: 36 mL/min — ABNORMAL LOW (ref >90)
Glucose, Bld: 102 mg/dL — ABNORMAL HIGH (ref 70–99)
Potassium: 4 mEq/L (ref 3.5–5.1)
Sodium: 143 mEq/L (ref 135–145)

## 2011-08-05 LAB — T4, FREE: Free T4: 1.1 ng/dL (ref 0.80–1.80)

## 2011-08-05 LAB — CBC
HCT: 31.3 % — ABNORMAL LOW (ref 36.0–46.0)
Hemoglobin: 10.1 g/dL — ABNORMAL LOW (ref 12.0–15.0)
MCH: 27.6 pg (ref 26.0–34.0)
MCHC: 32.3 g/dL (ref 30.0–36.0)
MCV: 85.5 fL (ref 78.0–100.0)
Platelets: 96 10*3/uL — ABNORMAL LOW (ref 150–400)
RBC: 3.66 MIL/uL — ABNORMAL LOW (ref 3.87–5.11)
RDW: 16.2 % — ABNORMAL HIGH (ref 11.5–15.5)
WBC: 5 10*3/uL (ref 4.0–10.5)

## 2011-08-05 LAB — PROTIME-INR
INR: 1.76 — ABNORMAL HIGH (ref 0.00–1.49)
Prothrombin Time: 20.8 seconds — ABNORMAL HIGH (ref 11.6–15.2)

## 2011-08-05 MED ORDER — BENEFIBER PO PACK: 1.0000 | PACK | Freq: Two times a day (BID) | ORAL | Status: AC

## 2011-08-05 MED ORDER — VALSARTAN-HYDROCHLOROTHIAZIDE 80-12.5 MG PO TABS: 1.0000 | ORAL_TABLET | Freq: Every day | ORAL | Status: DC

## 2011-08-05 MED ORDER — POLYETHYLENE GLYCOL 3350 17 G PO PACK: 17.0000 g | PACK | Freq: Every day | ORAL | Status: DC

## 2011-08-05 MED ORDER — FUROSEMIDE 20 MG PO TABS: ORAL_TABLET | ORAL | Status: DC

## 2011-08-05 MED ORDER — POLYETHYLENE GLYCOL 3350 17 G PO PACK: 17.0000 g | PACK | Freq: Every day | ORAL | Status: AC

## 2011-08-05 MED ORDER — OMEPRAZOLE 20 MG PO CPDR: 20.0000 mg | DELAYED_RELEASE_CAPSULE | Freq: Every day | ORAL | Status: DC

## 2011-08-05 MED ORDER — WARFARIN SODIUM 3 MG PO TABS: 3.0000 mg | ORAL_TABLET | Freq: Every day | ORAL | Status: DC

## 2011-08-05 MED ORDER — COLCHICINE 0.6 MG PO TABS: 0.6000 mg | ORAL_TABLET | Freq: Every day | ORAL | Status: DC | PRN

## 2011-08-05 NOTE — Plan of Care (Signed)
Problem: Discharge Progression Outcomes Goal: [place] arrangements in place Outcome: Not Applicable Date Met:  [id number] Not needed. Goal: Other Discharge Outcomes/Goals Outcome: Completed/Met Date Met:  08/05/11 Patient to follow up with cardiology regarding PT/INR levels.

## 2011-08-05 NOTE — Progress Notes (Signed)
Discharge instructions given on medications,and follow up visits,patient,and family verbalized understanding.Prescriptions given to patient.No c/o pain or discomfort noted.Accompanied by staff to an awaiting vehicle.

## 2011-08-05 NOTE — Discharge Summary (Signed)
Physician Discharge Summary  [name] [name] MRN: [id number] DOB/AGE: [birth year] 76 y.o.  PCP: [facility].   Admit date: 08/03/2011 Discharge date: 08/05/2011  Discharge Diagnoses:  1. GI bleeding/hematochezia, likely secondary to either a diverticular bleed or internal hemorrhoids in the setting of anticoagulation. 2. History of diverticular bleeding in 2011. 3. Blood loss anemia. The patient's hemoglobin was 11.1 on admission and 10.1 at the time of discharge. 4. Chronic thrombocytopenia of unknown etiology. Platelet count stable ranging between 101 130. Vitamin B12 level and free T4 were within normal limits. 5. Chronic atrial fibrillation. Coumadin will be held for one week and then restarted. The patient's INR was 3.33 on admission and 1.76 at the time of discharge, status post 2.5 mg of vitamin K. 6. Transient bradycardia secondary to beta blocker and calcium channel blocker. 7. Chronic malignant hypertension. 8. Hyperglycemia, resolved. 9. Acute renal failure in the setting of GI bleeding, Lasix, HCTZ, and ARB. The patient's creatinine was 2.01 on admission and 1.36 at the time of discharge. 10. Slightly elevated TSH, but normal free T4.    Medication List  As of 08/05/2011 12:10 PM   STOP taking these medications         valsartan-hydrochlorothiazide 320-12.5 MG per tablet      warfarin 1 MG tablet         TAKE these medications         CALCIUM PO   Take 1 tablet by mouth daily.      carvedilol 25 MG tablet   Commonly known as: COREG   Take 25 mg by mouth 2 (two) times daily.      colchicine 0.6 MG tablet   Take 1 tablet (0.6 mg total) by mouth daily as needed (Gout).      diltiazem 120 MG 24 hr capsule   Commonly known as: DILACOR XR   Take 120 mg by mouth daily.      furosemide 20 MG tablet   Commonly known as: LASIX   Decrease the dose to one tablet once daily as needed for swelling.      guar gum packet   Take 1 packet by mouth 2  (two) times daily with a meal. Benefiber for constipation.      hydrALAZINE 25 MG tablet   Commonly known as: APRESOLINE   Take 25 mg by mouth 3 (three) times daily.      lovastatin 40 MG tablet   Commonly known as: MEVACOR   Take 40 mg by mouth at bedtime.      mirtazapine 30 MG tablet   Commonly known as: REMERON   Take 30 mg by mouth at bedtime.      montelukast 10 MG tablet   Commonly known as: SINGULAIR   Take 10 mg by mouth every morning.      multivitamin with minerals Tabs   Take 1 tablet by mouth daily.      polyethylene glycol packet   Commonly known as: MIRALAX / GLYCOLAX   Take 17 g by mouth daily. For constipation.      traMADol 50 MG tablet   Commonly known as: ULTRAM   Take 50 mg by mouth every 6 (six) hours as needed. For pain      valsartan-hydrochlorothiazide 80-12.5 MG per tablet   Commonly known as: DIOVAN-HCT   Take 1 tablet by mouth daily. For treatment of high blood pressure.      warfarin 3 MG tablet   Commonly known as:  COUMADIN   Take 1 tablet (3 mg total) by mouth daily. Restart this medication next Sunday on 08/12/2011 at one -3 mg tablet daily or as directed by your heart doctor or your primary care doctor.            Discharge Condition: Improved and stable.  Disposition: 06-Home-Health Care Svc   Consults: Garfield Cornea, M.D.   Significant Diagnostic Studies: No results found.   Microbiology: No results found for this or any previous visit (from the past 240 hour(s)).   Labs: Results for orders placed during the hospital encounter of 08/03/11 (from the past 48 hour(s))  CBC WITH DIFFERENTIAL     Status: Abnormal   Collection Time   08/03/11  1:16 PM      Component Value Range Comment   WBC 4.3  4.0 - 10.5 K/uL    RBC 4.09  3.87 - 5.11 MIL/uL    Hemoglobin 11.1 (*) 12.0 - 15.0 g/dL    HCT 34.7 (*) 36.0 - 46.0 %    MCV 84.8  78.0 - 100.0 fL    MCH 27.1  26.0 - 34.0 pg    MCHC 32.0  30.0 - 36.0 g/dL    RDW 15.7 (*) 11.5 -  15.5 %    Platelets 133 (*) 150 - 400 K/uL    Neutrophils Relative 49  43 - 77 %    Neutro Abs 2.1  1.7 - 7.7 K/uL    Lymphocytes Relative 34  12 - 46 %    Lymphs Abs 1.5  0.7 - 4.0 K/uL    Monocytes Relative 8  3 - 12 %    Monocytes Absolute 0.3  0.1 - 1.0 K/uL    Eosinophils Relative 9 (*) 0 - 5 %    Eosinophils Absolute 0.4  0.0 - 0.7 K/uL    Basophils Relative 1  0 - 1 %    Basophils Absolute 0.0  0.0 - 0.1 K/uL   PROTIME-INR     Status: Abnormal   Collection Time   08/03/11  1:16 PM      Component Value Range Comment   Prothrombin Time 34.3 (*) 11.6 - 15.2 seconds    INR 3.33 (*) 0.00 - 1.49   COMPREHENSIVE METABOLIC PANEL     Status: Abnormal   Collection Time   08/03/11  1:16 PM      Component Value Range Comment   Sodium 141  135 - 145 mEq/L    Potassium 4.1  3.5 - 5.1 mEq/L    Chloride 107  96 - 112 mEq/L    CO2 21  19 - 32 mEq/L    Glucose, Bld 138 (*) 70 - 99 mg/dL    BUN 73 (*) 6 - 23 mg/dL    Creatinine, Ser 2.01 (*) 0.50 - 1.10 mg/dL    Calcium 9.7  8.4 - 10.5 mg/dL    Total Protein 7.7  6.0 - 8.3 g/dL    Albumin 3.9  3.5 - 5.2 g/dL    AST 18  0 - 37 U/L    ALT 16  0 - 35 U/L    Alkaline Phosphatase 100  39 - 117 U/L    Total Bilirubin 0.3  0.3 - 1.2 mg/dL    GFR calc non Af Amer 22 (*) >90 mL/min    GFR calc Af Amer 26 (*) >90 mL/min   LIPASE, BLOOD     Status: Normal   Collection Time   08/03/11  1:16 PM  Component Value Range Comment   Lipase 43  11 - 59 U/L   VITAMIN B12     Status: Normal   Collection Time   08/03/11  1:16 PM      Component Value Range Comment   Vitamin B-12 786  211 - 911 pg/mL   TSH     Status: Abnormal   Collection Time   08/03/11  1:16 PM      Component Value Range Comment   TSH 4.879 (*) 0.350 - 4.500 uIU/mL   T4, FREE     Status: Normal   Collection Time   08/03/11  1:16 PM      Component Value Range Comment   Free T4 1.10  0.80 - 1.80 ng/dL   URINALYSIS, ROUTINE W REFLEX MICROSCOPIC     Status: Abnormal   Collection  Time   08/03/11  2:00 PM      Component Value Range Comment   Color, Urine YELLOW  YELLOW    APPearance CLEAR  CLEAR    Specific Gravity, Urine 1.010  1.005 - 1.030    pH 6.0  5.0 - 8.0    Glucose, UA NEGATIVE  NEGATIVE mg/dL    Hgb urine dipstick TRACE (*) NEGATIVE    Bilirubin Urine NEGATIVE  NEGATIVE    Ketones, ur NEGATIVE  NEGATIVE mg/dL    Protein, ur NEGATIVE  NEGATIVE mg/dL    Urobilinogen, UA 0.2  0.0 - 1.0 mg/dL    Nitrite NEGATIVE  NEGATIVE    Leukocytes, UA NEGATIVE  NEGATIVE   URINE MICROSCOPIC-ADD ON     Status: Normal   Collection Time   08/03/11  2:00 PM      Component Value Range Comment   Squamous Epithelial / LPF RARE  RARE    WBC, UA 0-2  <3 WBC/hpf    RBC / HPF 0-2  <3 RBC/hpf    Bacteria, UA RARE  RARE   CBC     Status: Abnormal   Collection Time   08/03/11  6:31 PM      Component Value Range Comment   WBC 4.4  4.0 - 10.5 K/uL    RBC 4.01  3.87 - 5.11 MIL/uL    Hemoglobin 11.0 (*) 12.0 - 15.0 g/dL    HCT 33.9 (*) 36.0 - 46.0 %    MCV 84.5  78.0 - 100.0 fL    MCH 27.4  26.0 - 34.0 pg    MCHC 32.4  30.0 - 36.0 g/dL    RDW 15.7 (*) 11.5 - 15.5 %    Platelets 108 (*) 150 - 400 K/uL   CBC     Status: Abnormal   Collection Time   08/04/11 12:35 AM      Component Value Range Comment   WBC 7.4  4.0 - 10.5 K/uL    RBC 3.87  3.87 - 5.11 MIL/uL    Hemoglobin 10.6 (*) 12.0 - 15.0 g/dL    HCT 32.8 (*) 36.0 - 46.0 %    MCV 84.8  78.0 - 100.0 fL    MCH 27.4  26.0 - 34.0 pg    MCHC 32.3  30.0 - 36.0 g/dL    RDW 15.9 (*) 11.5 - 15.5 %    Platelets 113 (*) 150 - 400 K/uL   COMPREHENSIVE METABOLIC PANEL     Status: Abnormal   Collection Time   08/04/11  4:29 AM      Component Value Range Comment   Sodium 144  135 -  145 mEq/L    Potassium 3.8  3.5 - 5.1 mEq/L    Chloride 113 (*) 96 - 112 mEq/L    CO2 20  19 - 32 mEq/L    Glucose, Bld 92  70 - 99 mg/dL    BUN 51 (*) 6 - 23 mg/dL    Creatinine, Ser 1.51 (*) 0.50 - 1.10 mg/dL    Calcium 9.1  8.4 - 10.5 mg/dL     Total Protein 6.7  6.0 - 8.3 g/dL    Albumin 3.5  3.5 - 5.2 g/dL    AST 16  0 - 37 U/L    ALT 13  0 - 35 U/L    Alkaline Phosphatase 66  39 - 117 U/L    Total Bilirubin 0.7  0.3 - 1.2 mg/dL    GFR calc non Af Amer 32 (*) >90 mL/min    GFR calc Af Amer 37 (*) >90 mL/min   PROTIME-INR     Status: Abnormal   Collection Time   08/04/11  4:29 AM      Component Value Range Comment   Prothrombin Time 25.2 (*) 11.6 - 15.2 seconds    INR 2.24 (*) 0.00 - 1.49   APTT     Status: Abnormal   Collection Time   08/04/11  4:29 AM      Component Value Range Comment   aPTT 57 (*) 24 - 37 seconds   CBC     Status: Abnormal   Collection Time   08/04/11  7:10 AM      Component Value Range Comment   WBC 4.0  4.0 - 10.5 K/uL    RBC 3.78 (*) 3.87 - 5.11 MIL/uL    Hemoglobin 10.5 (*) 12.0 - 15.0 g/dL    HCT 32.0 (*) 36.0 - 46.0 %    MCV 84.7  78.0 - 100.0 fL    MCH 27.8  26.0 - 34.0 pg    MCHC 32.8  30.0 - 36.0 g/dL    RDW 15.9 (*) 11.5 - 15.5 %    Platelets 113 (*) 150 - 400 K/uL   PROTIME-INR     Status: Abnormal   Collection Time   08/05/11  5:43 AM      Component Value Range Comment   Prothrombin Time 20.8 (*) 11.6 - 15.2 seconds    INR 1.76 (*) 0.00 - 1.49   CBC     Status: Abnormal   Collection Time   08/05/11  5:43 AM      Component Value Range Comment   WBC 5.0  4.0 - 10.5 K/uL    RBC 3.66 (*) 3.87 - 5.11 MIL/uL    Hemoglobin 10.1 (*) 12.0 - 15.0 g/dL    HCT 31.3 (*) 36.0 - 46.0 %    MCV 85.5  78.0 - 100.0 fL    MCH 27.6  26.0 - 34.0 pg    MCHC 32.3  30.0 - 36.0 g/dL    RDW 16.2 (*) 11.5 - 15.5 %    Platelets 96 (*) 150 - 400 K/uL   BASIC METABOLIC PANEL     Status: Abnormal   Collection Time   08/05/11  5:43 AM      Component Value Range Comment   Sodium 143  135 - 145 mEq/L    Potassium 4.0  3.5 - 5.1 mEq/L    Chloride 114 (*) 96 - 112 mEq/L    CO2 19  19 - 32 mEq/L    Glucose, Bld  102 (*) 70 - 99 mg/dL    BUN 36 (*) 6 - 23 mg/dL    Creatinine, Ser 1.36 (*) 0.50 - 1.10 mg/dL      Calcium 8.9  8.4 - 10.5 mg/dL    GFR calc non Af Amer 36 (*) >90 mL/min    GFR calc Af Amer 42 (*) >90 mL/min      HPI : The patient is a 78 year old woman with a history significant for chronic atrial fibrillation, chronic warfarin coagulopathy, diverticular bleeding, and hypertension, who presented to the emergency department on 08/03/2011 with a chief complaint of rectal bleeding. In the emergency department, she was noted to be hypertensive with a blood pressure of 185/95, afebrile, and otherwise hemodynamically stable. Her lab data were significant for glucose of 138, hemoglobin of 11.1, platelet count of 133, BUN of 73, creatinine of 2.01, and an INR of 3.33. She was admitted for further evaluation and management.  HOSPITAL COURSE: The patient was started on IV fluids for treatment of dehydration and acute renal failure. She was given 2.5 mg of vitamin K intravenously. Coumadin was withheld. Her INR and hemoglobin/hematocrit were followed daily. Lasix and valsartan/HCTZ were withheld because of acute renal failure and dehydration. Metoprolol, hydralazine, and diltiazem were continued for treatment of malignant hypertension. For further evaluation, a number of studies were ordered. Her vitamin B12 level was within normal limits at 786. Her TSH was elevated at 4.8, but her free T4 was within normal limits at 1.10. Her urinalysis revealed no evidence of infection. Her followup platelet count decreased to 108 and then subsequently 96. She has a history of chronic thrombocytopenia which rarely drops below 100. Her thrombocytopenia in the past was attributed to allopurinol which she has been off for at least a year or 2. There was no indication for platelet transfusions. She would benefit from a nonemergent elective consultation with hematology. This will be deferred to her primary care physician.  Gastroenterologist, Dr. Gala Romney was consulted. Following his evaluation, he recommended Benefiber and MiraLax  to avoid constipation. He suspected a relatively trivial lower GI bleed from her hemorrhoids in the setting of constipation and a supratherapeutic INR. He did not believe that she required endoscopic evaluation at the time. He recommended holding anticoagulation for one week and maintaining an INR between 2-2.5 when it is restarted. If rebleeding occurs, she would need to followup with Dr. Oneida Alar for consideration of further evaluation.  The patient is hemorrhoidal or rectal bleeding completely resolved. Her hemoglobin did decrease 1 g, but this was in part secondary to be dilutional effects of IV fluids. She had no complaints of abdominal pain, nausea, or vomiting during the entire hospitalization. Her diet was advanced which she tolerated well. Her renal function improved, but not quite back to baseline. She was instructed to decrease the Lasix to 20 mg when necessary for swelling rather than 30 mg twice a day as needed for swelling. She also was advised to decrease the valsartan-HCTZ to 80 mg/12.5 mg daily instead of 160 mg daily. A new prescription was given. She was instructed to hold her Coumadin until 08/12/2011. She was advised to followup with her cardiologist and primary care physician in one to 2 weeks for further evaluation of her INR and renal function as well as her hemoglobin. She voiced understanding. All above, was discussed with her daughter as well.    Discharge Exam: Blood pressure 149/73, pulse 100, temperature 98.2 F (36.8 C), temperature source Oral, resp. rate 18, height 5'  5" (1.651 m), weight 86.3 kg (190 lb 4.1 oz), SpO2 93.00%.  Heart: Irregular, irregular. Lungs: Clear to auscultation bilaterally. Abdomen: Positive bowel sounds, soft, nontender, nondistended. Extremities: No pedal edema. Neurologic: She is alert and oriented x3.   Discharge Orders    Future Orders Please Complete By Expires   Diet - low sodium heart healthy      Increase activity slowly      Discharge  instructions      Comments:   You will need to have your blood test for your Coumadin level (INR) rechecked in one week. You will need to have your kidney function blood test (BUN and creatinine) rechecked in one week. He will need to have your blood count (CBC)  rechecked in one week. The dose of valsartan-HCT was decreased. The dose of Lasix (furosemide) was decreased, because of dehydration and abnormal increase in your kidney function test. Do not take Coumadin until 08/12/2011. Make an appointment to followup with your primary care doctor and heart doctor in one to 2 weeks.      Follow-up Information    Follow up with Marval Regal, MD in 1 week. (call to make appointment)    Contact information:   [address] [place] [phone]       Follow up with Manus Rudd, MD. Schedule an appointment as soon as possible for a visit in 3 weeks. (For followup of your rectal bleeding.)    Contact information:   [address] [place] [address] [place]       Follow up with Sanda Klein, MD. Schedule an appointment as soon as possible for a visit in 1 week. (For followup of your heart and Coumadin.)    Contact information:   [address] [place] [phone]          Discharge time: Greater than 35 minutes.   Signed: Wladyslawa Disbro 08/05/2011, 12:10 PM

## 2011-08-22 ENCOUNTER — Encounter: Payer: Self-pay | Admitting: Gastroenterology

## 2011-08-24 ENCOUNTER — Ambulatory Visit (INDEPENDENT_AMBULATORY_CARE_PROVIDER_SITE_OTHER): Payer: Medicare Other | Admitting: Urgent Care

## 2011-08-24 ENCOUNTER — Encounter: Payer: Self-pay | Admitting: Urgent Care

## 2011-08-24 VITALS — BP 168/82 | HR 57 | Temp 97.9°F | Ht 65.0 in | Wt 196.8 lb

## 2011-08-24 DIAGNOSIS — K922 Gastrointestinal hemorrhage, unspecified: Secondary | ICD-10-CM

## 2011-08-24 DIAGNOSIS — K5790 Diverticulosis of intestine, part unspecified, without perforation or abscess without bleeding: Secondary | ICD-10-CM

## 2011-08-24 DIAGNOSIS — D649 Anemia, unspecified: Secondary | ICD-10-CM

## 2011-08-24 DIAGNOSIS — K5730 Diverticulosis of large intestine without perforation or abscess without bleeding: Secondary | ICD-10-CM

## 2011-08-24 NOTE — Assessment & Plan Note (Signed)
See GI bleed.

## 2011-08-24 NOTE — Assessment & Plan Note (Signed)
Doing well after recent hospitalization for lower GI tract bleeding,  Suspected diverticular bleed vs. Hemorrhoids in the setting of anticoagulation.    She should continue coumadin unless issues recurrent bleeding.  FU w/ cardiologit & Marval Regal, MD for monitoring. No further colonoscopies advised at this point unless she has problems that require intervention.

## 2011-08-24 NOTE — Progress Notes (Signed)
Referring Provider: Marval Regal, MD Primary Care Physician:  Marval Regal, MD Primary Gastroenterologist:  Dr. Barney Drain  Chief Complaint  Patient presents with  . Follow-up    Suspected Diverticular Bleeding    HPI:  [name] [name] is a 78 y.o. female here for follow up for hospitalization for recent trivial lower GI bleed & anemia in the setting of anticoagulation for A fib suspected to be secondary to benign anorectal source or diverticula.  Pt has resumed coumadin.  Doing well.  Denies any complaints.  No further bleeding.   Denies any lower GI symptoms including constipation, diarrhea, rectal bleeding, melena or weight loss.   Denies any upper GI symptoms including heartburn, indigestion, nausea, vomiting, dysphagia, odynophagia or anorexia.  Past Medical History  Diagnosis Date  . Atrial fibrillation   . Hypertension   . Hypercholesterolemia   . Seasonal allergies   . Acid reflux   . CHF (congestive heart failure)     Hx of diastolic CHF.  Marland Kitchen Gout   . Diverticular hemorrhage 2011    Per colonoscopy  . Thrombocytopenia due to drugs     Allopurinol  . CKD (chronic kidney disease) stage 3, GFR 30-59 ml/min   . Hyperglycemia     Query DM 2  . Chronic anticoagulation   . Diverticulosis 2011    Per colonoscopy  . Internal hemorrhoids 2011    Per colonoscopy  . GI bleeding 08/03/2011    Not scoped but likely secondary to hemorrhoids or diverticulosis  . ARF (acute renal failure) 09/16/2010    Prerenal. Creatinine 1.36 before discharge.    Past Surgical History  Procedure Date  . Cesarean section   . Cataract extraction   . Joint replacement     Total right knee 2007  . Appendectomy   . Tubal ligation   . Laparotomy 09/22/2010    Procedure: EXPLORATORY LAPAROTOMY;  Surgeon: [name];  Location: [facility];  Service: General;  Laterality: N/A;  Lysis of Adhesions  . Esophagogastroduodenoscopy 08/20/09    small hiatal hernia/mild gastritis  . Ileocolonoscopy  08/20/09    normal terminal ileum/pancolonic diverticula/no polyps/benign colon mucosa    Current Outpatient Prescriptions  Medication Sig Dispense Refill  . CALCIUM PO Take 1 tablet by mouth daily.      . carvedilol (COREG) 25 MG tablet Take 25 mg by mouth 2 (two) times daily.        . colchicine 0.6 MG tablet Take 1 tablet (0.6 mg total) by mouth daily as needed (Gout).      Marland Kitchen diltiazem (DILACOR XR) 120 MG 24 hr capsule Take 120 mg by mouth daily.        . furosemide (LASIX) 20 MG tablet Decrease the dose to one tablet once daily as needed for swelling.      Marland Kitchen guar gum packet Take 1 packet by mouth 2 (two) times daily with a meal. Benefiber for constipation.      . hydrALAZINE (APRESOLINE) 25 MG tablet Take 25 mg by mouth 3 (three) times daily.      Marland Kitchen lovastatin (MEVACOR) 40 MG tablet Take 40 mg by mouth at bedtime.        . mirtazapine (REMERON) 30 MG tablet Take 30 mg by mouth at bedtime.        . montelukast (SINGULAIR) 10 MG tablet Take 10 mg by mouth every morning.        . Multiple Vitamin (MULTIVITAMIN WITH MINERALS) TABS Take 1 tablet by mouth  daily.      . omeprazole (PRILOSEC) 20 MG capsule Take 1 capsule (20 mg total) by mouth daily. For acid reflux.  30 capsule  2  . polyethylene glycol (MIRALAX / GLYCOLAX) packet Take 17 g by mouth daily.      . valsartan-hydrochlorothiazide (DIOVAN HCT) 80-12.5 MG per tablet Take 1 tablet by mouth daily. For treatment of high blood pressure.  30 tablet  2  . warfarin (COUMADIN) 3 MG tablet Take 3 mg by mouth daily. Per Dr C at [place] Current 3mg  QOD, 4mg  QOD      . DISCONTD: warfarin (COUMADIN) 3 MG tablet Take 1 tablet (3 mg total) by mouth daily. Restart this medication next Sunday on 08/12/2011 at one -3 mg tablet daily or as directed by your heart doctor or your primary care doctor.      . traMADol (ULTRAM) 50 MG tablet Take 50 mg by mouth every 6 (six) hours as needed. For pain        Allergies as of  08/24/2011 - Review Complete 08/24/2011  Allergen Reaction Noted  . Codeine Nausea And Vomiting 09/16/2010  . Sulfa antibiotics Nausea And Vomiting 09/16/2010    Review of Systems: See HPI, otherwise negative ROS Physical Exam: BP 168/82  Pulse 57  Temp 97.9 F (36.6 C) (Temporal)  Ht 5\' 5"  (1.651 m)  Wt 196 lb 12.8 oz (89.268 kg)  BMI 32.75 kg/m2 General:   Alert,  Well-developed, well-nourished, pleasant and cooperative in NAD.  Accompanied by her daughter today. Eyes:  Sclera clear, no icterus.   Conjunctiva pink. Mouth:  No deformity or lesions, oropharynx pink and moist. Neck:  Supple; no masses or thyromegaly. Heart:  Irregularly regular; no murmurs, clicks, rubs,  or gallops. Abdomen:  Normal bowel sounds.  No bruits.  Soft, non-tender and non-distended without masses, hepatosplenomegaly or hernias noted.  No guarding or rebound tenderness.   Rectal:  Deferred. Msk:  Symmetrical without gross deformities.  Pulses:  Normal pulses noted. Extremities:  1+ ankle edema bilaterally. Neurologic:  Alert and oriented x4;  grossly normal neurologically. Skin:  Intact without significant lesions or rashes.

## 2011-08-24 NOTE — Patient Instructions (Addendum)
Follow up with your heart doctor & Marval Regal, MD regarding coumadin Call if any further bleeding or problems

## 2011-08-27 NOTE — Progress Notes (Signed)
Faxed to PCP, Dr Bridget Hartshorn

## 2011-09-05 NOTE — Progress Notes (Signed)
UR Chart Review Completed  

## 2011-09-26 NOTE — Progress Notes (Signed)
JUN 2013: HB 11 TO 10.1 ASSOCIATED WITH BRBPR-IH V. TICS.  REVIEWED.  CONSIDER REPEAT TCS IF DEVELOPS PERISTENT BRBPR ON COUMADIN.

## 2011-10-02 ENCOUNTER — Ambulatory Visit: Payer: Self-pay | Admitting: Podiatry

## 2011-10-02 LAB — HEMOGLOBIN: HGB: 11.2 g/dL — ABNORMAL LOW (ref 12.0–16.0)

## 2011-10-02 LAB — POTASSIUM: Potassium: 3.9 mmol/L (ref 3.5–5.1)

## 2011-10-12 ENCOUNTER — Ambulatory Visit: Payer: Self-pay | Admitting: Podiatry

## 2011-10-12 DIAGNOSIS — I498 Other specified cardiac arrhythmias: Secondary | ICD-10-CM

## 2011-10-12 LAB — BASIC METABOLIC PANEL
Anion Gap: 9 (ref 7–16)
BUN: 64 mg/dL — ABNORMAL HIGH (ref 7–18)
Calcium, Total: 8.3 mg/dL — ABNORMAL LOW (ref 8.5–10.1)
Chloride: 109 mmol/L — ABNORMAL HIGH (ref 98–107)
Co2: 24 mmol/L (ref 21–32)
Creatinine: 2.43 mg/dL — ABNORMAL HIGH (ref 0.60–1.30)
EGFR (African American): 21 — ABNORMAL LOW
EGFR (Non-African Amer.): 18 — ABNORMAL LOW
Glucose: 107 mg/dL — ABNORMAL HIGH (ref 65–99)
Osmolality: 302 (ref 275–301)
Potassium: 3.8 mmol/L (ref 3.5–5.1)
Sodium: 142 mmol/L (ref 136–145)

## 2011-10-15 LAB — PATHOLOGY REPORT

## 2014-04-09 ENCOUNTER — Encounter (HOSPITAL_COMMUNITY): Payer: Self-pay | Admitting: Emergency Medicine

## 2014-04-09 ENCOUNTER — Emergency Department (HOSPITAL_COMMUNITY): Payer: Medicare Other

## 2014-04-09 ENCOUNTER — Inpatient Hospital Stay (HOSPITAL_COMMUNITY)
Admission: EM | Admit: 2014-04-09 | Discharge: 2014-04-14 | DRG: 554 | Disposition: A | Discharge status: Home / Self Care | Payer: Medicare Other | Attending: Internal Medicine | Admitting: Internal Medicine

## 2014-04-09 DIAGNOSIS — M109 Gout, unspecified: Principal | ICD-10-CM | POA: Diagnosis present

## 2014-04-09 DIAGNOSIS — I482 Chronic atrial fibrillation, unspecified: Secondary | ICD-10-CM | POA: Diagnosis present

## 2014-04-09 DIAGNOSIS — K219 Gastro-esophageal reflux disease without esophagitis: Secondary | ICD-10-CM | POA: Diagnosis present

## 2014-04-09 DIAGNOSIS — R110 Nausea: Secondary | ICD-10-CM

## 2014-04-09 DIAGNOSIS — M5440 Lumbago with sciatica, unspecified side: Secondary | ICD-10-CM

## 2014-04-09 DIAGNOSIS — N184 Chronic kidney disease, stage 4 (severe): Secondary | ICD-10-CM | POA: Diagnosis present

## 2014-04-09 DIAGNOSIS — M545 Low back pain: Secondary | ICD-10-CM | POA: Diagnosis not present

## 2014-04-09 DIAGNOSIS — Z8701 Personal history of pneumonia (recurrent): Secondary | ICD-10-CM

## 2014-04-09 DIAGNOSIS — I129 Hypertensive chronic kidney disease with stage 1 through stage 4 chronic kidney disease, or unspecified chronic kidney disease: Secondary | ICD-10-CM | POA: Diagnosis present

## 2014-04-09 DIAGNOSIS — Z23 Encounter for immunization: Secondary | ICD-10-CM

## 2014-04-09 DIAGNOSIS — E785 Hyperlipidemia, unspecified: Secondary | ICD-10-CM | POA: Diagnosis present

## 2014-04-09 DIAGNOSIS — T504X5A Adverse effect of drugs affecting uric acid metabolism, initial encounter: Secondary | ICD-10-CM | POA: Diagnosis present

## 2014-04-09 DIAGNOSIS — E780 Pure hypercholesterolemia: Secondary | ICD-10-CM | POA: Diagnosis present

## 2014-04-09 DIAGNOSIS — M549 Dorsalgia, unspecified: Secondary | ICD-10-CM

## 2014-04-09 DIAGNOSIS — N179 Acute kidney failure, unspecified: Secondary | ICD-10-CM | POA: Diagnosis present

## 2014-04-09 DIAGNOSIS — Z7901 Long term (current) use of anticoagulants: Secondary | ICD-10-CM

## 2014-04-09 DIAGNOSIS — R509 Fever, unspecified: Secondary | ICD-10-CM

## 2014-04-09 DIAGNOSIS — R52 Pain, unspecified: Secondary | ICD-10-CM

## 2014-04-09 DIAGNOSIS — I5030 Unspecified diastolic (congestive) heart failure: Secondary | ICD-10-CM | POA: Diagnosis present

## 2014-04-09 HISTORY — DX: Patient's other noncompliance with medication regimen for other reason: Z91.148

## 2014-04-09 HISTORY — DX: Patient's other noncompliance with medication regimen: Z91.14

## 2014-04-09 LAB — URINALYSIS, ROUTINE W REFLEX MICROSCOPIC
Bilirubin Urine: NEGATIVE
Glucose, UA: NEGATIVE mg/dL
Hgb urine dipstick: NEGATIVE
Ketones, ur: NEGATIVE mg/dL
Leukocytes, UA: NEGATIVE
Nitrite: NEGATIVE
Protein, ur: NEGATIVE mg/dL
Specific Gravity, Urine: 1.025 (ref 1.005–1.030)
Urobilinogen, UA: 0.2 mg/dL (ref 0.0–1.0)
pH: 5.5 (ref 5.0–8.0)

## 2014-04-09 LAB — COMPREHENSIVE METABOLIC PANEL
ALT: 50 U/L — ABNORMAL HIGH (ref 0–35)
AST: 36 U/L (ref 0–37)
Albumin: 3.1 g/dL — ABNORMAL LOW (ref 3.5–5.2)
Alkaline Phosphatase: 73 U/L (ref 39–117)
Anion gap: 10 (ref 5–15)
BUN: 31 mg/dL — ABNORMAL HIGH (ref 6–23)
CO2: 24 mmol/L (ref 19–32)
Calcium: 7.2 mg/dL — ABNORMAL LOW (ref 8.4–10.5)
Chloride: 107 mmol/L (ref 96–112)
Creatinine, Ser: 1.81 mg/dL — ABNORMAL HIGH (ref 0.50–1.10)
GFR calc Af Amer: 29 mL/min — ABNORMAL LOW (ref >90)
GFR calc non Af Amer: 25 mL/min — ABNORMAL LOW (ref >90)
Glucose, Bld: 149 mg/dL — ABNORMAL HIGH (ref 70–99)
Potassium: 3.2 mmol/L — ABNORMAL LOW (ref 3.5–5.1)
Sodium: 141 mmol/L (ref 135–145)
Total Bilirubin: 0.8 mg/dL (ref 0.3–1.2)
Total Protein: 7.2 g/dL (ref 6.0–8.3)

## 2014-04-09 LAB — CBC WITH DIFFERENTIAL/PLATELET
Basophils Absolute: 0 10*3/uL (ref 0.0–0.1)
Basophils Relative: 0 % (ref 0–1)
Eosinophils Absolute: 0 10*3/uL (ref 0.0–0.7)
Eosinophils Relative: 0 % (ref 0–5)
HCT: 33.7 % — ABNORMAL LOW (ref 36.0–46.0)
Hemoglobin: 10.7 g/dL — ABNORMAL LOW (ref 12.0–15.0)
Lymphocytes Relative: 6 % — ABNORMAL LOW (ref 12–46)
Lymphs Abs: 0.9 10*3/uL (ref 0.7–4.0)
MCH: 26.4 pg (ref 26.0–34.0)
MCHC: 31.8 g/dL (ref 30.0–36.0)
MCV: 83 fL (ref 78.0–100.0)
Monocytes Absolute: 1.4 10*3/uL — ABNORMAL HIGH (ref 0.1–1.0)
Monocytes Relative: 9 % (ref 3–12)
Neutro Abs: 12.8 10*3/uL — ABNORMAL HIGH (ref 1.7–7.7)
Neutrophils Relative %: 85 % — ABNORMAL HIGH (ref 43–77)
Platelets: 225 10*3/uL (ref 150–400)
RBC: 4.06 MIL/uL (ref 3.87–5.11)
RDW: 16.7 % — ABNORMAL HIGH (ref 11.5–15.5)
WBC: 15.1 10*3/uL — ABNORMAL HIGH (ref 4.0–10.5)

## 2014-04-09 NOTE — ED Provider Notes (Signed)
CSN: [id number]     Arrival date & time 04/09/14  2055 History  This chart was scribed for [name] Diego, MD by Edison Simon, ED Scribe. This patient was seen in room [facility]10/[facility]10 and the patient's care was started at 9:19 PM.    Chief Complaint  Patient presents with  . Generalized Body Aches   Patient is a 81 y.o. female presenting with back pain. The history is provided by the patient and a relative. No language interpreter was used.  Back Pain Location:  Lumbar spine Quality:  Unable to specify Radiates to: also has diffuse body aches. Pain severity:  Moderate Pain is:  Same all the time Onset quality:  Sudden Timing:  Constant Progression:  Unchanged Chronicity:  New Context comment:  Recent gout Relieved by:  Narcotics Worsened by:  Movement Ineffective treatments:  None tried Associated symptoms: fever   Associated symptoms: no abdominal pain, no chest pain, no dysuria and no headaches   Risk factors: obesity     HPI Comments: [name] [name] is a 81 y.o. female who presents to the Emergency Department complaining of low back pain. She notes she had gout in her feet 2 weeks ago and suspects back pain is related. She states pain is worse with movement. She states she is also having diffuse body aches, including her arms, shoulders, right hand, and right knee. She reports associated fever. Relative notes she had pneumonia in December. She denies dysuria or cough.   Past Medical History  Diagnosis Date  . Atrial fibrillation   . Hypertension   . Hypercholesterolemia   . Seasonal allergies   . Acid reflux   . CHF (congestive heart failure)     Hx of diastolic CHF.  Marland Kitchen Gout   . Diverticular hemorrhage 2011    Per colonoscopy  . Thrombocytopenia due to drugs     Allopurinol  . CKD (chronic kidney disease) stage 3, GFR 30-59 ml/min   . Hyperglycemia     Query DM 2  . Chronic anticoagulation   . Diverticulosis 2011    Per colonoscopy  . Internal hemorrhoids 2011    Per  colonoscopy  . GI bleeding 08/03/2011    Not scoped but likely secondary to hemorrhoids or diverticulosis  . ARF (acute renal failure) 09/16/2010    Prerenal. Creatinine 1.36 before discharge.  Marland Kitchen Hx of medication noncompliance    Past Surgical History  Procedure Laterality Date  . Cesarean section    . Cataract extraction    . Joint replacement      Total right knee 2007  . Appendectomy    . Tubal ligation    . Laparotomy  09/22/2010    Procedure: EXPLORATORY LAPAROTOMY;  Surgeon: [name];  Location: [facility];  Service: General;  Laterality: N/A;  Lysis of Adhesions  . Esophagogastroduodenoscopy  08/20/09    small hiatal hernia/mild gastritis  . Ileocolonoscopy  08/20/09    normal terminal ileum/pancolonic diverticula/no polyps/benign colon mucosa   No family history on file. History  Substance Use Topics  . Smoking status: Never Smoker   . Smokeless tobacco: Not on file  . Alcohol Use: No   OB History    No data available     Review of Systems  Constitutional: Positive for fever. Negative for appetite change and fatigue.  HENT: Negative for congestion, ear discharge and sinus pressure.   Eyes: Negative for discharge.  Respiratory: Negative for cough.   Cardiovascular: Negative for chest pain.  Gastrointestinal:  Negative for abdominal pain and diarrhea.  Genitourinary: Negative for dysuria, frequency and hematuria.  Musculoskeletal: Positive for myalgias and back pain.  Skin: Negative for rash.  Neurological: Negative for seizures and headaches.  Psychiatric/Behavioral: Negative for hallucinations.      Allergies  Codeine and Sulfa antibiotics  Home Medications   Prior to Admission medications   Medication Sig Start Date End Date Taking? Authorizing Provider  CALCIUM PO Take 1 tablet by mouth daily.    Historical Provider, MD  carvedilol (COREG) 25 MG tablet Take 25 mg by mouth 2 (two) times daily.      Historical Provider, MD  colchicine 0.6 MG tablet Take  1 tablet (0.6 mg total) by mouth daily as needed (Gout). 08/05/11   Rexene Alberts, MD  diltiazem (DILACOR XR) 120 MG 24 hr capsule Take 120 mg by mouth daily.      Historical Provider, MD  furosemide (LASIX) 20 MG tablet Decrease the dose to one tablet once daily as needed for swelling. 08/05/11   Rexene Alberts, MD  hydrALAZINE (APRESOLINE) 25 MG tablet Take 25 mg by mouth 3 (three) times daily.    Historical Provider, MD  lovastatin (MEVACOR) 40 MG tablet Take 40 mg by mouth at bedtime.      Historical Provider, MD  mirtazapine (REMERON) 30 MG tablet Take 30 mg by mouth at bedtime.      Historical Provider, MD  montelukast (SINGULAIR) 10 MG tablet Take 10 mg by mouth every morning.      Historical Provider, MD  Multiple Vitamin (MULTIVITAMIN WITH MINERALS) TABS Take 1 tablet by mouth daily.    Historical Provider, MD  omeprazole (PRILOSEC) 20 MG capsule Take 1 capsule (20 mg total) by mouth daily. For acid reflux. 08/05/11 08/04/12  Rexene Alberts, MD  polyethylene glycol (MIRALAX / Floria Raveling) packet Take 17 g by mouth daily.    Historical Provider, MD  traMADol (ULTRAM) 50 MG tablet Take 50 mg by mouth every 6 (six) hours as needed. For pain    Historical Provider, MD  valsartan-hydrochlorothiazide (DIOVAN HCT) 80-12.5 MG per tablet Take 1 tablet by mouth daily. For treatment of high blood pressure. 08/05/11 08/04/12  Rexene Alberts, MD  warfarin (COUMADIN) 3 MG tablet Take 3 mg by mouth daily. Per Dr C at [place] Current 3mg  QOD, 4mg  QOD 08/05/11   Rexene Alberts, MD   BP 157/67 mmHg  Pulse 114  Temp(Src) 101.6 F (38.7 C) (Oral)  Resp 24  Ht 5\' 5"  (1.651 m)  Wt 204 lb (92.534 kg)  BMI 33.95 kg/m2  SpO2 95% Physical Exam  Constitutional: She is oriented to person, place, and time. She appears well-developed.  HENT:  Head: Normocephalic.  Eyes: Conjunctivae and EOM are normal. No scleral icterus.  Neck: Neck supple. No thyromegaly present.  Cardiovascular: Normal  rate and regular rhythm.  Exam reveals no gallop and no friction rub.   No murmur heard. Pulmonary/Chest: No stridor. She has no wheezes. She has no rales. She exhibits no tenderness.  Abdominal: She exhibits no distension. There is no tenderness. There is no rebound.  Musculoskeletal: Normal range of motion. She exhibits no edema.  Moderate lumbar spine tenderness Mild right knee tenderness  Lymphadenopathy:    She has no cervical adenopathy.  Neurological: She is oriented to person, place, and time. She exhibits normal muscle tone. Coordination normal.  Skin: No rash noted. No erythema.  Psychiatric: She has a normal mood and affect. Her behavior is normal.  Nursing note and vitals reviewed.   ED Course  Procedures (including critical care time)  DIAGNOSTIC STUDIES: Oxygen Saturation is 95% on room air, adequate by my interpretation.    COORDINATION OF CARE: 9:24 PM Discussed treatment plan with patient at beside, the patient agrees with the plan and has no further questions at this time.   Labs Review Labs Reviewed - No data to display  Imaging Review No results found.   EKG Interpretation None      MDM   Final diagnoses:  None    Fever and back pain,   Pt to be seen by [facility]ad hospitalist  The chart was scribed for me under my direct supervision.  I personally performed the history, physical, and medical decision making and all procedures in the evaluation of this patient.[name] Diego, MD 04/10/14 [phone]

## 2014-04-09 NOTE — ED Notes (Signed)
Pt arrived from home by EMS. Reported pt having back pain & general body aches. EMS reports sats in the low 90's & temp of 101. Pt was dx w/ pneumonia 2 weeks ago at [facility].

## 2014-04-09 NOTE — ED Notes (Signed)
Pt took norco at home.

## 2014-04-09 NOTE — ED Notes (Signed)
MD at bedside. 

## 2014-04-10 ENCOUNTER — Encounter (HOSPITAL_COMMUNITY): Payer: Self-pay | Admitting: *Deleted

## 2014-04-10 ENCOUNTER — Emergency Department (HOSPITAL_COMMUNITY): Payer: Medicare Other

## 2014-04-10 ENCOUNTER — Observation Stay (HOSPITAL_COMMUNITY): Payer: Medicare Other

## 2014-04-10 DIAGNOSIS — R509 Fever, unspecified: Secondary | ICD-10-CM | POA: Diagnosis present

## 2014-04-10 DIAGNOSIS — M549 Dorsalgia, unspecified: Secondary | ICD-10-CM | POA: Diagnosis present

## 2014-04-10 DIAGNOSIS — N189 Chronic kidney disease, unspecified: Secondary | ICD-10-CM | POA: Diagnosis not present

## 2014-04-10 DIAGNOSIS — M5489 Other dorsalgia: Secondary | ICD-10-CM

## 2014-04-10 DIAGNOSIS — I482 Chronic atrial fibrillation: Secondary | ICD-10-CM | POA: Diagnosis not present

## 2014-04-10 DIAGNOSIS — M5440 Lumbago with sciatica, unspecified side: Secondary | ICD-10-CM

## 2014-04-10 LAB — BASIC METABOLIC PANEL
Anion gap: 12 (ref 5–15)
BUN: 37 mg/dL — ABNORMAL HIGH (ref 6–23)
CO2: 23 mmol/L (ref 19–32)
Calcium: 6.9 mg/dL — ABNORMAL LOW (ref 8.4–10.5)
Chloride: 106 mmol/L (ref 96–112)
Creatinine, Ser: 2.17 mg/dL — ABNORMAL HIGH (ref 0.50–1.10)
GFR calc Af Amer: 23 mL/min — ABNORMAL LOW (ref >90)
GFR calc non Af Amer: 20 mL/min — ABNORMAL LOW (ref >90)
Glucose, Bld: 156 mg/dL — ABNORMAL HIGH (ref 70–99)
Potassium: 3.5 mmol/L (ref 3.5–5.1)
Sodium: 141 mmol/L (ref 135–145)

## 2014-04-10 LAB — CBC
HCT: 32.2 % — ABNORMAL LOW (ref 36.0–46.0)
Hemoglobin: 9.9 g/dL — ABNORMAL LOW (ref 12.0–15.0)
MCH: 26.3 pg (ref 26.0–34.0)
MCHC: 30.7 g/dL (ref 30.0–36.0)
MCV: 85.4 fL (ref 78.0–100.0)
Platelets: 229 10*3/uL (ref 150–400)
RBC: 3.77 MIL/uL — ABNORMAL LOW (ref 3.87–5.11)
RDW: 17.1 % — ABNORMAL HIGH (ref 11.5–15.5)
WBC: 20.1 10*3/uL — ABNORMAL HIGH (ref 4.0–10.5)

## 2014-04-10 LAB — INFLUENZA PANEL BY PCR (TYPE A & B)
H1N1 flu by pcr: NOT DETECTED
Influenza A By PCR: NEGATIVE
Influenza B By PCR: NEGATIVE

## 2014-04-10 LAB — MRSA PCR SCREENING: MRSA by PCR: NEGATIVE

## 2014-04-10 LAB — URIC ACID: Uric Acid, Serum: 11.6 mg/dL — ABNORMAL HIGH (ref 2.4–7.0)

## 2014-04-10 LAB — PROTIME-INR
INR: 4.52 — ABNORMAL HIGH (ref 0.00–1.49)
Prothrombin Time: 43.2 seconds — ABNORMAL HIGH (ref 11.6–15.2)

## 2014-04-10 LAB — CALCIUM, IONIZED: Calcium, Ion: 0.9 mmol/L — ABNORMAL LOW (ref 1.12–1.32)

## 2014-04-10 MED ORDER — COLCHICINE 0.6 MG PO TABS: 0.6000 mg | ORAL_TABLET | Freq: Every day | ORAL | Status: DC

## 2014-04-10 MED ORDER — HYDROMORPHONE HCL 1 MG/ML IJ SOLN: 1.0000 mg | INTRAMUSCULAR | Status: DC | PRN

## 2014-04-10 MED ORDER — HYDROMORPHONE HCL 1 MG/ML IJ SOLN
0.5000 mg | Freq: Once | INTRAMUSCULAR | Status: AC
Start: 2014-04-10 — End: 2014-04-10
  Administered 2014-04-10: 0.5 mg via INTRAVENOUS
  Filled 2014-04-10: qty 1

## 2014-04-10 MED ORDER — HYDROMORPHONE HCL 1 MG/ML IJ SOLN
1.0000 mg | Freq: Once | INTRAMUSCULAR | Status: AC
Start: 2014-04-10 — End: 2014-04-10
  Administered 2014-04-10: 1 mg via INTRAVENOUS
  Filled 2014-04-10: qty 1

## 2014-04-10 MED ORDER — HYDRALAZINE HCL 25 MG PO TABS: 50.0000 mg | ORAL_TABLET | Freq: Three times a day (TID) | ORAL | Status: DC

## 2014-04-10 MED ORDER — ONDANSETRON HCL 4 MG PO TABS: 4.0000 mg | ORAL_TABLET | Freq: Four times a day (QID) | ORAL | Status: DC | PRN

## 2014-04-10 MED ORDER — COLCHICINE 0.6 MG PO TABS
0.6000 mg | ORAL_TABLET | Freq: Two times a day (BID) | ORAL | Status: DC
  Administered 2014-04-10 – 2014-04-11 (×3): 0.6 mg via ORAL
  Filled 2014-04-10 (×3): qty 1

## 2014-04-10 MED ORDER — ONDANSETRON HCL 4 MG/2ML IJ SOLN
4.0000 mg | Freq: Once | INTRAMUSCULAR | Status: AC
  Administered 2014-04-10: 4 mg via INTRAVENOUS
  Filled 2014-04-10: qty 2

## 2014-04-10 MED ORDER — ONDANSETRON HCL 4 MG/2ML IJ SOLN: 4.0000 mg | Freq: Four times a day (QID) | INTRAMUSCULAR | Status: DC | PRN

## 2014-04-10 MED ORDER — POLYETHYLENE GLYCOL 3350 17 G PO PACK
17.0000 g | PACK | Freq: Two times a day (BID) | ORAL | Status: DC
  Administered 2014-04-10 – 2014-04-12 (×4): 17 g via ORAL
  Filled 2014-04-10 (×5): qty 1

## 2014-04-10 MED ORDER — PNEUMOCOCCAL VAC POLYVALENT 25 MCG/0.5ML IJ INJ
0.5000 mL | INJECTION | INTRAMUSCULAR | Status: AC
  Administered 2014-04-11: 0.5 mL via INTRAMUSCULAR
  Filled 2014-04-10: qty 0.5

## 2014-04-10 MED ORDER — METHYLPREDNISOLONE SODIUM SUCC 125 MG IJ SOLR
60.0000 mg | Freq: Once | INTRAMUSCULAR | Status: AC
  Administered 2014-04-10: 60 mg via INTRAVENOUS
  Filled 2014-04-10: qty 2

## 2014-04-10 MED ORDER — DOCUSATE SODIUM 100 MG PO CAPS
200.0000 mg | ORAL_CAPSULE | Freq: Two times a day (BID) | ORAL | Status: DC
  Administered 2014-04-10 – 2014-04-12 (×5): 200 mg via ORAL
  Filled 2014-04-10 (×5): qty 2

## 2014-04-10 MED ORDER — WARFARIN - PHARMACIST DOSING INPATIENT
Status: DC
  Administered 2014-04-13: 16:00:00

## 2014-04-10 MED ORDER — FLUTICASONE PROPIONATE 50 MCG/ACT NA SUSP
1.0000 | Freq: Every day | NASAL | Status: DC
  Administered 2014-04-10 – 2014-04-14 (×5): 1 via NASAL
  Filled 2014-04-10 (×2): qty 16

## 2014-04-10 MED ORDER — POTASSIUM CHLORIDE IN NACL 20-0.9 MEQ/L-% IV SOLN
INTRAVENOUS | Status: AC
  Administered 2014-04-11: 04:00:00 via INTRAVENOUS

## 2014-04-10 MED ORDER — OXYCODONE HCL 5 MG PO TABS
5.0000 mg | ORAL_TABLET | ORAL | Status: DC | PRN
  Administered 2014-04-10 (×2): 5 mg via ORAL
  Filled 2014-04-10 (×3): qty 1

## 2014-04-10 MED ORDER — PRAVASTATIN SODIUM 10 MG PO TABS
20.0000 mg | ORAL_TABLET | Freq: Every day | ORAL | Status: DC
  Administered 2014-04-10 – 2014-04-13 (×4): 20 mg via ORAL
  Filled 2014-04-10 (×4): qty 2

## 2014-04-10 MED ORDER — ALBUTEROL SULFATE (2.5 MG/3ML) 0.083% IN NEBU: 2.5000 mg | INHALATION_SOLUTION | RESPIRATORY_TRACT | Status: DC | PRN

## 2014-04-10 MED ORDER — CARVEDILOL 3.125 MG PO TABS: 6.2500 mg | ORAL_TABLET | Freq: Two times a day (BID) | ORAL | Status: DC

## 2014-04-10 MED ORDER — CARVEDILOL 3.125 MG PO TABS
3.1250 mg | ORAL_TABLET | Freq: Two times a day (BID) | ORAL | Status: DC
  Administered 2014-04-10 – 2014-04-14 (×8): 3.125 mg via ORAL
  Filled 2014-04-10 (×8): qty 1

## 2014-04-10 MED ORDER — PANTOPRAZOLE SODIUM 40 MG PO TBEC
40.0000 mg | DELAYED_RELEASE_TABLET | Freq: Every day | ORAL | Status: DC
Start: 2014-04-10 — End: 2014-04-14
  Administered 2014-04-10 – 2014-04-14 (×5): 40 mg via ORAL
  Filled 2014-04-10 (×5): qty 1

## 2014-04-10 MED ORDER — POTASSIUM CHLORIDE IN NACL 20-0.9 MEQ/L-% IV SOLN
INTRAVENOUS | Status: DC
  Administered 2014-04-10: 04:00:00 via INTRAVENOUS

## 2014-04-10 NOTE — Progress Notes (Signed)
Pt transferring to unit 300 today. Pt/family is aware and agreeable to transfer. Assessment is unchanged from this morning and receiving RN has been given report. Belongings sent with pt at bedside.  

## 2014-04-10 NOTE — Progress Notes (Addendum)
Patient admitted earlier this morning with chief complaints of generalized joint pains and aches, low back pain acute on chronic. Had a severe gout flare in both of her feet 2 weeks ago.   She is now feeling much better, back pain has almost completely resolved, her present complaints are bilateral shoulder and wrist pain along with pain in her digits and toes. Despite her leukocytosis I do not think this is an infectious process. This appears to be disseminated gout. Have checked a uric acid level which has come back to 11.   We will place her on twice a day colchicine, one dose of IV Solu-Medrol,  blood cultures have been drawn. Afebrile. Feels a whole lot better. Back pain is almost completely gone. Follow MRI L-spine. Follow blood cultures. I doubt a bacterial infection on the basis of clinical findings and examination. She has no spine tenderness at all on my exam. No lower extremity weakness no signs of cauda equina.   Initial CXR with ? SBO, unlikely per exam, repeat ABD X ray.

## 2014-04-10 NOTE — Progress Notes (Signed)
UR completed 

## 2014-04-10 NOTE — Evaluation (Signed)
Physical Therapy Evaluation Patient Details Name: [name] [name] MRN: [id number] DOB: 06/06/1930 Today's Date: [name]   History of Present Illness  81 yo female h/o ckd, htn, afib on coumadin, chf, h/o gib comes in with severe lumbar back pain worse than Normal cannot get out of bed at all today. Has been dealing with [name] gouty flare, but more joints hurt than normal. Her dtr went to check on her, pt could not get out of bed, so they came to ED. Has been congested, no cough. No fevers. No dysuria. No n/v/d. No cp or abd pain. No rashes. No recent trauma. No swelling in legs. Her back occasionally gets bad but not this bad. Was not aware of any fever, temp over 101 in ED. No sick contacts. Asked to obs for w/u of her back pain and fever. Only dilaudid 0.5 mg iv given in ED for pain, which was not at all effective per patient. States she hurts "all over" but mainly in lower back.    Clinical Impression  [name] [name], [name] 81 year old female with 2L/min O2 nasal Cannula and IV line on the right, complaints of back pain upon admission, was seen awake, alert, oriented, cooperative and able to follow directions well. Patient reports that pain is normally aggravated by certain movements such as Sit to/from Stand. Currently, Patient reports no pain: Patient was medicated prior to PT evaluation. Current Functional Status: Patient requires Moderate Assist of 1 person to perform Supine to/from Sit, Min Assist to perform Sit to/from Stand with multiple attempts and using rocking forward/backward technique, and Min Guard Assist for Gait. Patient remains [name] fall risk. Recommendation: HHPT vs SNF per patient/family preference. Nursing Staff was notified on patient current functional status and observe patient for possible activities that [name] [name] aggravate pain.    Follow Up Recommendations Home health PT;SNF (per patient/family preference)    Equipment Recommendations  none   Recommendations for  Other Services OT consult     Precautions / Restrictions Precautions Precautions: Fall Restrictions Weight Bearing Restrictions: No      Mobility  Bed Mobility Overal bed mobility: Needs Assistance Bed Mobility: Supine to Sit  Supine to sit: Mod assist;HOB elevated   Transfers Overall transfer level: Needs assistance Equipment used: Rolling walker (2 wheeled) Transfers: Sit to/from Stand Sit to Stand: Min assist   Ambulation/Gait Ambulation/Gait assistance: Min guard Ambulation Distance (Feet): 5 Feet Assistive device: Rolling walker (2 wheeled) Gait Pattern/deviations: Step-to pattern;Trunk flexed;Decreased stride length   Gait velocity interpretation: Below normal speed for age/gender      Balance Overall balance assessment: Needs assistance Sitting-balance support: Feet supported;Bilateral upper extremity supported        Pertinent Vitals/Pain Pain Assessment: No/denies pain    Home Living Family/patient expects to be discharged to:: Private residence Living Arrangements: Other relatives Available Help at Discharge: Family;Available 24 hours/day (Children lives close by) Type of Home: House Home Access: Level entry;Other (comment) (1 step going down the basement.)     Home Layout: Able to live on main level with bedroom/bathroom;Other (Comment);Two level (Laundry is in the basement. Patient does not go down the basement. Granddaughter does the laundry) Home Equipment: Environmental consultant - 2 wheels;Cane - single point;Bedside commode;Shower seat;Tub bench;Toilet riser;Grab bars - tub/shower;Hand held shower head;Wheelchair - manual (Grab bars in the toilet/shower is broken and currently being fixed)      Prior Function Level of Independence: Independent with assistive device(s) (Patient reports using cane most of the time and sometimes uses the  walker)       Hand Dominance   Dominant Hand: Right    Extremity/Trunk Assessment   Upper Extremity Assessment: Defer to OT  evaluation    Lower Extremity Assessment: Generalized weakness   Cervical / Trunk Assessment: Kyphotic    Communication   Communication: No difficulties  Cognition Arousal/Alertness: Awake/alert Behavior During Therapy: WFL for tasks assessed/performed Overall Cognitive Status: Within Functional Limits for tasks assessed      Exercises General Exercises - Lower Extremity Ankle Circles/Pumps: AROM;Both;10 reps;Supine Hip ABduction/ADduction: AROM;Both;5 reps;Supine Straight Leg Raises: AROM;Both;5 reps;Supine      Assessment/Plan    PT Assessment   PT Diagnosis Generalized weakness;Acute pain;Difficulty walking   PT Problem List Decreased activity tolerance;Decreased mobility;Decreased balance;Pain;Decreased strength  PT Treatment Interventions Gait training;Functional mobility training;Therapeutic activities;Therapeutic exercise;Balance training   PT Goals (Current goals can be found in the Care Plan section) Acute Rehab PT Goals Patient Stated Goal: to return home PT Goal Formulation: With patient Time For Goal Achievement: 04/24/14 Potential to Achieve Goals: Good    Frequency Min 3X/week   Barriers to discharge none         End of Session Equipment Utilized During Treatment: Gait belt Activity Tolerance: Patient tolerated treatment well Patient left: in chair;with call bell/phone within reach;with chair alarm set Nurse Communication: Mobility status         Time: [id number] PT Time Calculation (min) (ACUTE ONLY): 63 min   Charges:   PT Evaluation $Initial PT Evaluation Tier I: 1 Procedure PT Treatments $Therapeutic Exercise: 8-22 mins    [name] [name] [name] [name], 5:16 PM

## 2014-04-10 NOTE — Progress Notes (Signed)
ANTICOAGULATION CONSULT NOTE - Initial Consult  Pharmacy Consult for Coumadin Indication: atrial fibrillation  Allergies  Allergen Reactions  . Codeine Nausea And Vomiting  . Sulfa Antibiotics Nausea And Vomiting    Patient Measurements: Height: 5\' 5"  (165.1 cm) Weight: 202 lb 13.2 oz (92 kg) IBW/kg (Calculated) : 57  Vital Signs: Temp: 97.4 F (36.3 C) (03/05 0745) Temp Source: Oral (03/05 0745) BP: 105/63 mmHg (03/05 0110) Pulse Rate: 92 (03/05 0110)  Labs:  Recent Labs  04/09/14 2200 04/10/14 0013 04/10/14 0442  HGB 10.7*  --  9.9*  HCT 33.7*  --  32.2*  PLT 225  --  229  LABPROT  --  43.2*  --   INR  --  4.52*  --   CREATININE 1.81*  --  2.17*    Estimated Creatinine Clearance: 22.4 mL/min (by C-G formula based on Cr of 2.17).   Medical History: Past Medical History  Diagnosis Date  . Atrial fibrillation   . Hypertension   . Hypercholesterolemia   . Seasonal allergies   . Acid reflux   . CHF (congestive heart failure)     Hx of diastolic CHF.  Marland Kitchen Gout   . Diverticular hemorrhage 2011    Per colonoscopy  . Thrombocytopenia due to drugs     Allopurinol  . CKD (chronic kidney disease) stage 3, GFR 30-59 ml/min   . Hyperglycemia     Query DM 2  . Chronic anticoagulation   . Diverticulosis 2011    Per colonoscopy  . Internal hemorrhoids 2011    Per colonoscopy  . GI bleeding 08/03/2011    Not scoped but likely secondary to hemorrhoids or diverticulosis  . ARF (acute renal failure) 09/16/2010    Prerenal. Creatinine 1.36 before discharge.  Marland Kitchen Hx of medication noncompliance     Medications:  Prescriptions prior to admission  Medication Sig Dispense Refill Last Dose  . Calcium Carbonate-Vitamin D (CALCIUM 500 + D PO) Take 1 tablet by mouth daily.   Past Week at Unknown time  . carvedilol (COREG) 12.5 MG tablet Take 12.5 mg by mouth 2 (two) times daily with a meal.   04/09/2014 at Unknown time  . colchicine 0.6 MG tablet Take 1 tablet (0.6 mg total) by  mouth daily as needed (Gout). (Patient taking differently: Take 0.6 mg by mouth daily. )   04/09/2014 at Unknown time  . diltiazem (DILACOR XR) 120 MG 24 hr capsule Take 120 mg by mouth daily.     04/09/2014 at Unknown time  . fluticasone (FLONASE) 50 MCG/ACT nasal spray Place 1 spray into both nostrils daily.   Past Week at Unknown time  . furosemide (LASIX) 20 MG tablet Decrease the dose to one tablet once daily as needed for swelling. (Patient taking differently: Take 20 mg by mouth every other day. Decrease the dose to one tablet once daily as needed for swelling.)   04/08/2014 at Unknown time  . hydrALAZINE (APRESOLINE) 50 MG tablet Take 50 mg by mouth 3 (three) times daily.   04/09/2014 at morning  . HYDROcodone-acetaminophen (NORCO/VICODIN) 5-325 MG per tablet Take 1 tablet by mouth 2 (two) times daily as needed for moderate pain.   04/09/2014 at Unknown time  . lovastatin (MEVACOR) 40 MG tablet Take 40 mg by mouth at bedtime.     04/08/2014 at Unknown time  . mirtazapine (REMERON) 30 MG tablet Take 30 mg by mouth at bedtime.     04/08/2014  . montelukast (SINGULAIR) 10 MG tablet Take  10 mg by mouth every morning.     04/09/2014 at Unknown time  . Multiple Vitamin (MULTIVITAMIN WITH MINERALS) TABS Take 1 tablet by mouth daily.   04/09/2014 at Unknown time  . polyethylene glycol (MIRALAX / GLYCOLAX) packet Take 17 g by mouth daily.   04/08/2014  . traMADol (ULTRAM) 50 MG tablet Take 50 mg by mouth every 6 (six) hours as needed. For pain   04/09/2014 at Unknown time  . traZODone (DESYREL) 50 MG tablet Take 25-50 mg by mouth at bedtime as needed for sleep.   04/08/2014 at Unknown time  . warfarin (COUMADIN) 3 MG tablet Take 2-3 mg by mouth See admin instructions. Take 3mg  on Mondays through Fridays, then take 2mg  on Saturdays and Sundays   04/08/2014 at Unknown time  . omeprazole (PRILOSEC) 20 MG capsule Take 1 capsule (20 mg total) by mouth daily. For acid reflux. 30 capsule 2 Taking  . valsartan-hydrochlorothiazide  (DIOVAN HCT) 80-12.5 MG per tablet Take 1 tablet by mouth daily. For treatment of high blood pressure. 30 tablet 2 Taking    Assessment: 81 yo F on chronic Coumadin for Afib.  Home dose listed above.  INR supra-therapeutic on admission.  No bleeding noted.  Concern for SBO per imaging.  Goal of Therapy:  INR 2-3   Plan:  Hold Coumadin today for elevated INR Daily INR  Libi Corso, Lavonia Drafts 04/10/2014,10:56 AM

## 2014-04-10 NOTE — H&P (Signed)
PCP:   PROVIDER NOT IN SYSTEM   Chief Complaint:  Back pain  HPI: 81 yo female h/o ckd, htn, afib on coumadin, chf, h/o gib comes in with severe lumbar back pain worse than  Normal cannot get out of bed at all today.  Has been dealing with a gouty flare, but more joints hurt than normal.  Her dtr went to check on her, pt could not get out of bed, so they came to ED.  Has been congested, no cough.  No fevers.  No dysuria.  No n/v/d.  No cp or abd pain.  No rashes.  No recent trauma.  No swelling in legs.  Her back occasionally gets bad but not this bad.  Was not aware of any fever, temp over 101 in ED.  No sick contacts.  Asked to obs for w/u of her back pain and fever.  Only dilaudid 0.5 mg iv given in ED for pain, which was not at all effective per patient.  States she hurts "all over" but mainly in lower back.  Review of Systems:  Positive and negative as per HPI otherwise all other systems are negative  Past Medical History: Past Medical History  Diagnosis Date  . Atrial fibrillation   . Hypertension   . Hypercholesterolemia   . Seasonal allergies   . Acid reflux   . CHF (congestive heart failure)     Hx of diastolic CHF.  Marland Kitchen Gout   . Diverticular hemorrhage 2011    Per colonoscopy  . Thrombocytopenia due to drugs     Allopurinol  . CKD (chronic kidney disease) stage 3, GFR 30-59 ml/min   . Hyperglycemia     Query DM 2  . Chronic anticoagulation   . Diverticulosis 2011    Per colonoscopy  . Internal hemorrhoids 2011    Per colonoscopy  . GI bleeding 08/03/2011    Not scoped but likely secondary to hemorrhoids or diverticulosis  . ARF (acute renal failure) 09/16/2010    Prerenal. Creatinine 1.36 before discharge.  Marland Kitchen Hx of medication noncompliance    Past Surgical History  Procedure Laterality Date  . Cesarean section    . Cataract extraction    . Joint replacement      Total right knee 2007  . Appendectomy    . Tubal ligation    . Laparotomy  09/22/2010    Procedure:  EXPLORATORY LAPAROTOMY;  Surgeon: [name];  Location: [facility];  Service: General;  Laterality: N/A;  Lysis of Adhesions  . Esophagogastroduodenoscopy  08/20/09    small hiatal hernia/mild gastritis  . Ileocolonoscopy  08/20/09    normal terminal ileum/pancolonic diverticula/no polyps/benign colon mucosa    Medications: Prior to Admission medications   Medication Sig Start Date End Date Taking? Authorizing Provider  Calcium Carbonate-Vitamin D (CALCIUM 500 + D PO) Take 1 tablet by mouth daily.   Yes Historical Provider, MD  carvedilol (COREG) 12.5 MG tablet Take 12.5 mg by mouth 2 (two) times daily with a meal.   Yes Historical Provider, MD  colchicine 0.6 MG tablet Take 1 tablet (0.6 mg total) by mouth daily as needed (Gout). Patient taking differently: Take 0.6 mg by mouth daily.  08/05/11  Yes Rexene Alberts, MD  diltiazem (DILACOR XR) 120 MG 24 hr capsule Take 120 mg by mouth daily.     Yes Historical Provider, MD  fluticasone (FLONASE) 50 MCG/ACT nasal spray Place 1 spray into both nostrils daily.   Yes Historical Provider, MD  furosemide (LASIX) 20 MG tablet Decrease the dose to one tablet once daily as needed for swelling. Patient taking differently: Take 20 mg by mouth every other day. Decrease the dose to one tablet once daily as needed for swelling. 08/05/11  Yes Rexene Alberts, MD  hydrALAZINE (APRESOLINE) 50 MG tablet Take 50 mg by mouth 3 (three) times daily.   Yes Historical Provider, MD  HYDROcodone-acetaminophen (NORCO/VICODIN) 5-325 MG per tablet Take 1 tablet by mouth 2 (two) times daily as needed for moderate pain.   Yes Historical Provider, MD  lovastatin (MEVACOR) 40 MG tablet Take 40 mg by mouth at bedtime.     Yes Historical Provider, MD  mirtazapine (REMERON) 30 MG tablet Take 30 mg by mouth at bedtime.     Yes Historical Provider, MD  montelukast (SINGULAIR) 10 MG tablet Take 10 mg by mouth every morning.     Yes Historical Provider, MD  Multiple Vitamin (MULTIVITAMIN  WITH MINERALS) TABS Take 1 tablet by mouth daily.   Yes Historical Provider, MD  polyethylene glycol (MIRALAX / GLYCOLAX) packet Take 17 g by mouth daily.   Yes Historical Provider, MD  traMADol (ULTRAM) 50 MG tablet Take 50 mg by mouth every 6 (six) hours as needed. For pain   Yes Historical Provider, MD  traZODone (DESYREL) 50 MG tablet Take 25-50 mg by mouth at bedtime as needed for sleep.   Yes Historical Provider, MD  warfarin (COUMADIN) 3 MG tablet Take 2-3 mg by mouth See admin instructions. Take 3mg  on Mondays through Fridays, then take 2mg  on Saturdays and Sundays 08/05/11  Yes Rexene Alberts, MD  omeprazole (PRILOSEC) 20 MG capsule Take 1 capsule (20 mg total) by mouth daily. For acid reflux. 08/05/11 08/04/12  Rexene Alberts, MD  valsartan-hydrochlorothiazide (DIOVAN HCT) 80-12.5 MG per tablet Take 1 tablet by mouth daily. For treatment of high blood pressure. 08/05/11 08/04/12  Rexene Alberts, MD    Allergies:   Allergies  Allergen Reactions  . Codeine Nausea And Vomiting  . Sulfa Antibiotics Nausea And Vomiting    Social History:  reports that she has never smoked. She does not have any smokeless tobacco history on file. She reports that she does not drink alcohol or use illicit drugs.  Family History: none  Physical Exam: Filed Vitals:   04/09/14 2230 04/09/14 2300 04/09/14 2330 04/10/14 0110  BP: 143/62  124/55 105/63  Pulse: 94  97 92  Temp:  98.2 F (36.8 C)  100.4 F (38 C)  TempSrc:  Oral  Oral  Resp:  25  24  Height:      Weight:      SpO2: 97%  94% 93%   General appearance: alert, cooperative and no distress Head: Normocephalic, without obvious abnormality, atraumatic Eyes: negative Nose: Nares normal. Septum midline. Mucosa normal. No drainage or sinus tenderness. Neck: no JVD and supple, symmetrical, trachea midline Lungs: clear to auscultation bilaterally Heart: regular rate and rhythm, S1, S2 normal, no murmur, click, rub or gallop Abdomen: soft,  non-tender; bowel sounds normal; no masses,  no organomegaly Extremities: extremities normal, atraumatic, no cyanosis or edema Pulses: 2+ and symmetric Skin: Skin color, texture, turgor normal. No rashes or lesions Neurologic: Grossly normal    Labs on Admission:   Recent Labs  04/09/14 2200  NA 141  K 3.2*  CL 107  CO2 24  GLUCOSE 149*  BUN 31*  CREATININE 1.81*  CALCIUM 7.2*    Recent Labs  04/09/14 2200  AST 36  ALT  50*  ALKPHOS 73  BILITOT 0.8  PROT 7.2  ALBUMIN 3.1*    Recent Labs  04/09/14 2200  WBC 15.1*  NEUTROABS 12.8*  HGB 10.7*  HCT 33.7*  MCV 83.0  PLT 225    Radiological Exams on Admission: Dg Chest Portable 1 View  04/09/2014   CLINICAL DATA:  Back pain. Body aches. CHF. Hypertension and pneumonia.  EXAM: PORTABLE CHEST - 1 VIEW  COMPARISON:  02/01/2014  FINDINGS: Midline trachea. Moderate to marked enlargement of the cardiopericardial silhouette. Atherosclerosis in the transverse aorta. No pleural effusion or pneumothorax. Pulmonary interstitial prominence is mild. No lobar consolidation. Thickening of the right minor fissure.  IMPRESSION: Cardiomegaly with mild pulmonary venous congestion.  No evidence of pneumonia.  Aortic atherosclerosis.   Electronically Signed   By: Abigail Miyamoto M.D.   On: 04/09/2014 22:43    Assessment/Plan  81 yo female with intractable lower back pain with fever, leukocytosis  Principal Problem:   Back pain-  obs for mri in am of l spine.  Prn dilaudid, increase dose as needed. PT eval after mri, may need HH/PT.  Active Problems:  Stable unless o/w noted   CKD (chronic kidney disease)   Chronic a-fib   Warfarin anticoagulation-     Hypocalcemia-  Ck pth studies   Gout attack   Fever  ua and cxr neg.  Flu pending.  obs on medical .  Full code.    DAVID,RACHAL A 04/10/2014, 2:50 AM

## 2014-04-11 DIAGNOSIS — Z7901 Long term (current) use of anticoagulants: Secondary | ICD-10-CM | POA: Diagnosis not present

## 2014-04-11 DIAGNOSIS — E785 Hyperlipidemia, unspecified: Secondary | ICD-10-CM | POA: Diagnosis not present

## 2014-04-11 DIAGNOSIS — Z23 Encounter for immunization: Secondary | ICD-10-CM | POA: Diagnosis not present

## 2014-04-11 DIAGNOSIS — I5030 Unspecified diastolic (congestive) heart failure: Secondary | ICD-10-CM | POA: Diagnosis not present

## 2014-04-11 DIAGNOSIS — Z8701 Personal history of pneumonia (recurrent): Secondary | ICD-10-CM | POA: Diagnosis not present

## 2014-04-11 DIAGNOSIS — I482 Chronic atrial fibrillation: Secondary | ICD-10-CM | POA: Diagnosis not present

## 2014-04-11 DIAGNOSIS — T504X5A Adverse effect of drugs affecting uric acid metabolism, initial encounter: Secondary | ICD-10-CM | POA: Diagnosis not present

## 2014-04-11 DIAGNOSIS — N184 Chronic kidney disease, stage 4 (severe): Secondary | ICD-10-CM | POA: Diagnosis not present

## 2014-04-11 DIAGNOSIS — K219 Gastro-esophageal reflux disease without esophagitis: Secondary | ICD-10-CM | POA: Diagnosis not present

## 2014-04-11 DIAGNOSIS — I129 Hypertensive chronic kidney disease with stage 1 through stage 4 chronic kidney disease, or unspecified chronic kidney disease: Secondary | ICD-10-CM | POA: Diagnosis not present

## 2014-04-11 DIAGNOSIS — M109 Gout, unspecified: Secondary | ICD-10-CM | POA: Diagnosis not present

## 2014-04-11 DIAGNOSIS — M545 Low back pain: Secondary | ICD-10-CM | POA: Diagnosis present

## 2014-04-11 DIAGNOSIS — E780 Pure hypercholesterolemia: Secondary | ICD-10-CM | POA: Diagnosis not present

## 2014-04-11 DIAGNOSIS — N179 Acute kidney failure, unspecified: Secondary | ICD-10-CM

## 2014-04-11 LAB — BASIC METABOLIC PANEL
Anion gap: 10 (ref 5–15)
BUN: 60 mg/dL — ABNORMAL HIGH (ref 6–23)
CO2: 20 mmol/L (ref 19–32)
Calcium: 6.2 mg/dL — CL (ref 8.4–10.5)
Chloride: 108 mmol/L (ref 96–112)
Creatinine, Ser: 3.71 mg/dL — ABNORMAL HIGH (ref 0.50–1.10)
GFR calc Af Amer: 12 mL/min — ABNORMAL LOW (ref >90)
GFR calc non Af Amer: 10 mL/min — ABNORMAL LOW (ref >90)
Glucose, Bld: 150 mg/dL — ABNORMAL HIGH (ref 70–99)
Potassium: 4.4 mmol/L (ref 3.5–5.1)
Sodium: 138 mmol/L (ref 135–145)

## 2014-04-11 LAB — PROTIME-INR
INR: 5.45 (ref 0.00–1.49)
Prothrombin Time: 49.4 seconds — ABNORMAL HIGH (ref 11.6–15.2)

## 2014-04-11 LAB — CBC
HCT: 28.6 % — ABNORMAL LOW (ref 36.0–46.0)
Hemoglobin: 8.7 g/dL — ABNORMAL LOW (ref 12.0–15.0)
MCH: 26 pg (ref 26.0–34.0)
MCHC: 30.4 g/dL (ref 30.0–36.0)
MCV: 85.6 fL (ref 78.0–100.0)
Platelets: 175 10*3/uL (ref 150–400)
RBC: 3.34 MIL/uL — ABNORMAL LOW (ref 3.87–5.11)
RDW: 17.1 % — ABNORMAL HIGH (ref 11.5–15.5)
WBC: 15.4 10*3/uL — ABNORMAL HIGH (ref 4.0–10.5)

## 2014-04-11 LAB — PTH, INTACT AND CALCIUM
Calcium, Total (PTH): 6.7 mg/dL — CL (ref 8.7–10.3)
PTH: 45 pg/mL (ref 15–65)

## 2014-04-11 LAB — ALBUMIN: Albumin: 2.6 g/dL — ABNORMAL LOW (ref 3.5–5.2)

## 2014-04-11 MED ORDER — SODIUM CHLORIDE 0.9 % IV SOLN
INTRAVENOUS | Status: DC
  Administered 2014-04-11 – 2014-04-14 (×6): via INTRAVENOUS

## 2014-04-11 NOTE — Progress Notes (Addendum)
[facility] PROGRESS NOTE  [name] [name] [id number] DOB: 08/28/1930 DOA: 04/09/2014 PCP: PROVIDER NOT IN SYSTEM  Assessment/Plan: Back Pain/Gout Flare -Much improved/resolved with steroids and colchicine. -Patient wants to go home today. -Has been ambulating without difficulty.  Fever -Resolved. -Suspect related to gout flare.  Acute on CKD Stage III-IV -Cr with marked increased since admission. -Suspect related to colchicine use. -DC colchicine, start IVF, check renal US.  HTN -Well controlled. -Continue current regimen.  Atrial Fibrillation -Rate controlled. -Continue coumadin.  HLD -Continue statin.  Code Status: Full Code Family Communication: Patient only  Disposition Plan: Home when ready   Consultants:  None   Antibiotics:  None   Subjective: Feels well, no complaints. Wants to go home today.  Objective: Filed Vitals:   04/10/14 1856 04/10/14 2037 04/11/14 0600 04/11/14 1129  BP: 110/72 98/80 102/64 120/64  Pulse:  95 96 85  Temp:  98.1 F (36.7 C) 98.4 F (36.9 C)   TempSrc:  Oral Oral   Resp:  20 20   Height:      Weight:      SpO2:  94% 96%     Intake/Output Summary (Last 24 hours) at 04/11/14 1442 Last data filed at 04/11/14 1145  Gross per 24 hour  Intake 1883.33 ml  Output    200 ml  Net 1683.33 ml   Filed Weights   04/09/14 2104 04/10/14 0500  Weight: 92.534 kg (204 lb) 92 kg (202 lb 13.2 oz)    Exam:   General:  AA Ox3  Cardiovascular: RRR  Respiratory: CTA B  Abdomen: S/NT/ND/+BS  Extremities: no C/C/E   Neurologic:  Intact/non-focal  Data Reviewed: Basic Metabolic Panel:  Recent Labs Lab 04/09/14 2200 04/10/14 0442 04/11/14 0522  NA 141 141 138  K 3.2* 3.5 4.4  CL 107 106 108  CO2 24 23 20   GLUCOSE 149* 156* 150*  BUN 31* 37* 60*  CREATININE 1.81* 2.17* 3.71*  CALCIUM 7.2* 6.9* 6.2*   Liver Function Tests:  Recent Labs Lab 04/09/14 2200 04/11/14 0706  AST 36  --   ALT 50*   --   ALKPHOS 73  --   BILITOT 0.8  --   PROT 7.2  --   ALBUMIN 3.1* 2.6*   No results for input(s): LIPASE, AMYLASE in the last 168 hours. No results for input(s): AMMONIA in the last 168 hours. CBC:  Recent Labs Lab 04/09/14 2200 04/10/14 0442 04/11/14 0522  WBC 15.1* 20.1* 15.4*  NEUTROABS 12.8*  --   --   HGB 10.7* 9.9* 8.7*  HCT 33.7* 32.2* 28.6*  MCV 83.0 85.4 85.6  PLT 225 229 175   Cardiac Enzymes: No results for input(s): CKTOTAL, CKMB, CKMBINDEX, TROPONINI in the last 168 hours. BNP (last 3 results) No results for input(s): BNP in the last 8760 hours.  ProBNP (last 3 results) No results for input(s): PROBNP in the last 8760 hours.  CBG: No results for input(s): GLUCAP in the last 168 hours.  Recent Results (from the past 240 hour(s))  MRSA PCR Screening     Status: None   Collection Time: 04/10/14  2:53 AM  Result Value Ref Range Status   MRSA by PCR NEGATIVE NEGATIVE Final    Comment:        The GeneXpert MRSA Assay (FDA approved for NASAL specimens only), is one component of a comprehensive MRSA colonization surveillance program. It is not intended to diagnose MRSA infection nor to guide or monitor treatment  for MRSA infections.   Culture, blood (routine x 2)     Status: None (Preliminary result)   Collection Time: 04/10/14 10:17 AM  Result Value Ref Range Status   Specimen Description BLOOD LEFT HAND  Final   Special Requests BOTTLES DRAWN AEROBIC ONLY 6CC  Final   Culture NO GROWTH 1 DAY  Final   Report Status PENDING  Incomplete  Culture, blood (routine x 2)     Status: None (Preliminary result)   Collection Time: 04/10/14 10:18 AM  Result Value Ref Range Status   Specimen Description BLOOD RIGHT HAND  Final   Special Requests BOTTLES DRAWN AEROBIC ONLY 6CC  Final   Culture NO GROWTH 1 DAY  Final   Report Status PENDING  Incomplete     Studies: Dg Lumbar Spine Complete  04/10/2014   CLINICAL DATA:  Generalized abdominal pain  EXAM:  LUMBAR SPINE - COMPLETE 4+ VIEW  COMPARISON:  Abdominal CT 09/16/2010  FINDINGS: Dilated bowel in the central abdomen suggesting small bowel obstruction. The patient has had small bowel obstruction in the past based on CT imaging. On this lumbar spine study, the bowel gas pattern is not completely visualized.  There is no evidence of lumbar spine fracture, endplate erosion, or acute subluxation. There is diffuse degenerative disc disease with advanced disc narrowing. Chronic retrolisthesis noted at L1-2 and L2-3, mild.  IMPRESSION: 1. Suspect small bowel obstruction. 2. No acute lumbar spine findings. 3. Diffuse degenerative disc disease.   Electronically Signed   By: Monte Fantasia M.D.   On: 04/10/2014 03:02   Mr Lumbar Spine Wo Contrast  04/10/2014   CLINICAL DATA:  Low back and bilateral leg pain for many years. Worsening symptoms over the last 3 months. No acute injury or prior relevant surgery. Initial encounter.  EXAM: MRI LUMBAR SPINE WITHOUT CONTRAST  TECHNIQUE: Multiplanar, multisequence MR imaging of the lumbar spine was performed. No intravenous contrast was administered.  COMPARISON:  Radiographs 04/10/2014. Abdominal pelvic CT 09/16/2010.  FINDINGS: CT demonstrates 5 lumbar type vertebral bodies. Convex left scoliosis and multilevel endplate degenerative changes are noted. There is no evidence of acute fracture or pars defect.  The conus medullaris extends to the L1-2 level and appears normal. No paraspinal abnormalities are identified.  L1-2: Chronic degenerative disc disease with loss of disc height, diffuse disc bulging and paraspinal osteophyte formation. Mild asymmetric narrowing of the right lateral recess and right foramen without gross nerve root encroachment.  L2-3: Chronic degenerative disc disease with loss of disc height, annular disc bulging and paraspinal osteophyte formation asymmetric to the right. Mild narrowing of both lateral recesses and the right foramen without gross nerve root  encroachment.  L3-4: Lesser loss of disc height at this level with annular disc bulging. A small inferiorly migrated disc fragment on the prior CT is no longer visualized. Moderate facet and ligamentous hypertrophy contribute to mild spinal stenosis. No evidence of nerve root encroachment. There is some periarticular edema associated with the facet joints, worse on the left.  L4-5: Chronic degenerative disc disease with loss of disc height, annular disc bulging and endplate osteophytes asymmetric to the left. There is asymmetric left-sided facet and ligamentous hypertrophy. These factors contribute to mild to moderate spinal stenosis. There is mild narrowing of the lateral recesses and left foramen.  L5-S1: Disc height and hydration are relatively maintained. Mild bilateral facet hypertrophy. No spinal stenosis or nerve root encroachment.  IMPRESSION: 1. Multilevel chronic degenerative disc disease from L1-2 through L4-5, similar to  prior abdominal CT. 2. Mild resulting spinal stenosis at L3-4 and mild-to-moderate spinal stenosis at L4-5. Mild chronic narrowing of the lateral recesses and foramina as described above. 3. No definite acute findings.   Electronically Signed   By: Richardean Sale M.D.   On: 04/10/2014 12:17   Dg Chest Portable 1 View  04/09/2014   CLINICAL DATA:  Back pain. Body aches. CHF. Hypertension and pneumonia.  EXAM: PORTABLE CHEST - 1 VIEW  COMPARISON:  02/01/2014  FINDINGS: Midline trachea. Moderate to marked enlargement of the cardiopericardial silhouette. Atherosclerosis in the transverse aorta. No pleural effusion or pneumothorax. Pulmonary interstitial prominence is mild. No lobar consolidation. Thickening of the right minor fissure.  IMPRESSION: Cardiomegaly with mild pulmonary venous congestion.  No evidence of pneumonia.  Aortic atherosclerosis.   Electronically Signed   By: Abigail Miyamoto M.D.   On: 04/09/2014 22:43   Dg Abd Portable 1v  04/10/2014   CLINICAL DATA:  Nausea. Diffuse  pain. Gout. Diffuse abdominal pain.  EXAM: PORTABLE ABDOMEN - 1 VIEW  COMPARISON:  None.  FINDINGS: The bowel gas pattern is normal. No radio-opaque calculi or other significant radiographic abnormality are seen.  IMPRESSION: Negative.   Electronically Signed   By: Dereck Ligas M.D.   On: 04/10/2014 12:59    Scheduled Meds: . carvedilol  3.125 mg Oral BID WC  . docusate sodium  200 mg Oral BID  . fluticasone  1 spray Each Nare Daily  . pantoprazole  40 mg Oral Daily  . polyethylene glycol  17 g Oral BID  . pravastatin  20 mg Oral q1800  . Warfarin - Pharmacist Dosing Inpatient   Does not apply Q24H   Continuous Infusions: . sodium chloride      Principal Problem:   Back pain Active Problems:   ARF (acute renal failure)   Chronic kidney disease (CKD), stage IV (severe)   Chronic a-fib   Warfarin anticoagulation   Hypocalcemia   Gout attack   Fever    Time spent: 35 minutes. Greater than 50% of this time was spent in direct contact with the patient coordinating care.    Lelon Frohlich  [facility] Pager [phone]  If 7PM-7AM, please contact night-coverage at [email or web address], password [facility] 04/11/2014, 2:42 PM

## 2014-04-11 NOTE — Progress Notes (Signed)
ANTICOAGULATION CONSULT NOTE - Initial Consult  Pharmacy Consult for Coumadin Indication: atrial fibrillation  Allergies  Allergen Reactions  . Codeine Nausea And Vomiting  . Sulfa Antibiotics Nausea And Vomiting    Patient Measurements: Height: 5\' 5"  (165.1 cm) Weight: 202 lb 13.2 oz (92 kg) IBW/kg (Calculated) : 57  Vital Signs: Temp: 98.4 F (36.9 C) (03/06 0600) Temp Source: Oral (03/06 0600) BP: 102/64 mmHg (03/06 0600) Pulse Rate: 96 (03/06 0600)  Labs:  Recent Labs  04/09/14 2200 04/10/14 0013 04/10/14 0442 04/11/14 0522  HGB 10.7*  --  9.9* 8.7*  HCT 33.7*  --  32.2* 28.6*  PLT 225  --  229 175  LABPROT  --  43.2*  --  49.4*  INR  --  4.52*  --  5.45*  CREATININE 1.81*  --  2.17* 3.71*    Estimated Creatinine Clearance: 13.1 mL/min (by C-G formula based on Cr of 3.71).   Medical History: Past Medical History  Diagnosis Date  . Atrial fibrillation   . Hypertension   . Hypercholesterolemia   . Seasonal allergies   . Acid reflux   . CHF (congestive heart failure)     Hx of diastolic CHF.  Marland Kitchen Gout   . Diverticular hemorrhage 2011    Per colonoscopy  . Thrombocytopenia due to drugs     Allopurinol  . CKD (chronic kidney disease) stage 3, GFR 30-59 ml/min   . Hyperglycemia     Query DM 2  . Chronic anticoagulation   . Diverticulosis 2011    Per colonoscopy  . Internal hemorrhoids 2011    Per colonoscopy  . GI bleeding 08/03/2011    Not scoped but likely secondary to hemorrhoids or diverticulosis  . ARF (acute renal failure) 09/16/2010    Prerenal. Creatinine 1.36 before discharge.  Marland Kitchen Hx of medication noncompliance     Medications:  Prescriptions prior to admission  Medication Sig Dispense Refill Last Dose  . Calcium Carbonate-Vitamin D (CALCIUM 500 + D PO) Take 1 tablet by mouth daily.   Past Week at Unknown time  . carvedilol (COREG) 12.5 MG tablet Take 12.5 mg by mouth 2 (two) times daily with a meal.   04/09/2014 at Unknown time  .  colchicine 0.6 MG tablet Take 1 tablet (0.6 mg total) by mouth daily as needed (Gout). (Patient taking differently: Take 0.6 mg by mouth daily. )   04/09/2014 at Unknown time  . diltiazem (DILACOR XR) 120 MG 24 hr capsule Take 120 mg by mouth daily.     04/09/2014 at Unknown time  . fluticasone (FLONASE) 50 MCG/ACT nasal spray Place 1 spray into both nostrils daily.   Past Week at Unknown time  . furosemide (LASIX) 20 MG tablet Decrease the dose to one tablet once daily as needed for swelling. (Patient taking differently: Take 20 mg by mouth every other day. Decrease the dose to one tablet once daily as needed for swelling.)   04/08/2014 at Unknown time  . hydrALAZINE (APRESOLINE) 50 MG tablet Take 50 mg by mouth 3 (three) times daily.   04/09/2014 at morning  . HYDROcodone-acetaminophen (NORCO/VICODIN) 5-325 MG per tablet Take 1 tablet by mouth 2 (two) times daily as needed for moderate pain.   04/09/2014 at Unknown time  . lovastatin (MEVACOR) 40 MG tablet Take 40 mg by mouth at bedtime.     04/08/2014 at Unknown time  . mirtazapine (REMERON) 30 MG tablet Take 30 mg by mouth at bedtime.     04/08/2014  .  montelukast (SINGULAIR) 10 MG tablet Take 10 mg by mouth every morning.     04/09/2014 at Unknown time  . Multiple Vitamin (MULTIVITAMIN WITH MINERALS) TABS Take 1 tablet by mouth daily.   04/09/2014 at Unknown time  . polyethylene glycol (MIRALAX / GLYCOLAX) packet Take 17 g by mouth daily.   04/08/2014  . traMADol (ULTRAM) 50 MG tablet Take 50 mg by mouth every 6 (six) hours as needed. For pain   04/09/2014 at Unknown time  . traZODone (DESYREL) 50 MG tablet Take 25-50 mg by mouth at bedtime as needed for sleep.   04/08/2014 at Unknown time  . warfarin (COUMADIN) 3 MG tablet Take 2-3 mg by mouth See admin instructions. Take 3mg  on Mondays through Fridays, then take 2mg  on Saturdays and Sundays   04/08/2014 at Unknown time  . omeprazole (PRILOSEC) 20 MG capsule Take 1 capsule (20 mg total) by mouth daily. For acid reflux. 30  capsule 2 Taking  . valsartan-hydrochlorothiazide (DIOVAN HCT) 80-12.5 MG per tablet Take 1 tablet by mouth daily. For treatment of high blood pressure. 30 tablet 2 Taking    Assessment: 81 yo F on chronic Coumadin for Afib.  Home dose listed above.  INR supra-therapeutic on admission and continues to rise.  Last Coumadin dose was on 3/3.  No bleeding noted.  Concern for SBO per imaging.  Goal of Therapy:  INR 2-3   Plan:  Hold Coumadin today for elevated INR Daily INR Consider low dose Vit K if INR continues to increase or concern for bleeding  [name] [name], Lavonia Drafts 04/11/2014,7:55 AM

## 2014-04-11 NOTE — Progress Notes (Signed)
Lab notified nurse of critical calcium level this am of 6.2. Notified Dr Shanon Brow, who ordered an Albumin level this am.

## 2014-04-11 NOTE — Progress Notes (Signed)
CRITICAL VALUE ALERT  Critical value received:  INR: 5.45  Date of notification:  04/11/2014  Time of notification:  0745  Critical value read back: yes  Nurse who received alert:  T.James, RN  MD notified (1st page): Dr. Jerilee Hoh  Time of first page:  1100

## 2014-04-12 ENCOUNTER — Inpatient Hospital Stay (HOSPITAL_COMMUNITY): Payer: Medicare Other

## 2014-04-12 DIAGNOSIS — M1000 Idiopathic gout, unspecified site: Secondary | ICD-10-CM

## 2014-04-12 DIAGNOSIS — T504X5A Adverse effect of drugs affecting uric acid metabolism, initial encounter: Secondary | ICD-10-CM | POA: Diagnosis present

## 2014-04-12 DIAGNOSIS — I482 Chronic atrial fibrillation: Secondary | ICD-10-CM | POA: Diagnosis present

## 2014-04-12 DIAGNOSIS — I129 Hypertensive chronic kidney disease with stage 1 through stage 4 chronic kidney disease, or unspecified chronic kidney disease: Secondary | ICD-10-CM | POA: Diagnosis present

## 2014-04-12 DIAGNOSIS — E780 Pure hypercholesterolemia: Secondary | ICD-10-CM | POA: Diagnosis present

## 2014-04-12 DIAGNOSIS — Z7901 Long term (current) use of anticoagulants: Secondary | ICD-10-CM | POA: Diagnosis not present

## 2014-04-12 DIAGNOSIS — Z23 Encounter for immunization: Secondary | ICD-10-CM | POA: Diagnosis not present

## 2014-04-12 DIAGNOSIS — N179 Acute kidney failure, unspecified: Secondary | ICD-10-CM | POA: Diagnosis present

## 2014-04-12 DIAGNOSIS — K219 Gastro-esophageal reflux disease without esophagitis: Secondary | ICD-10-CM | POA: Diagnosis present

## 2014-04-12 DIAGNOSIS — M109 Gout, unspecified: Secondary | ICD-10-CM | POA: Diagnosis present

## 2014-04-12 DIAGNOSIS — I5030 Unspecified diastolic (congestive) heart failure: Secondary | ICD-10-CM | POA: Diagnosis present

## 2014-04-12 DIAGNOSIS — N184 Chronic kidney disease, stage 4 (severe): Secondary | ICD-10-CM | POA: Diagnosis present

## 2014-04-12 DIAGNOSIS — Z8701 Personal history of pneumonia (recurrent): Secondary | ICD-10-CM | POA: Diagnosis not present

## 2014-04-12 DIAGNOSIS — E785 Hyperlipidemia, unspecified: Secondary | ICD-10-CM | POA: Diagnosis present

## 2014-04-12 DIAGNOSIS — M545 Low back pain: Secondary | ICD-10-CM | POA: Diagnosis present

## 2014-04-12 DIAGNOSIS — R509 Fever, unspecified: Secondary | ICD-10-CM | POA: Diagnosis not present

## 2014-04-12 LAB — PROTIME-INR
INR: 3.91 — ABNORMAL HIGH (ref 0.00–1.49)
Prothrombin Time: 38.6 seconds — ABNORMAL HIGH (ref 11.6–15.2)

## 2014-04-12 LAB — BASIC METABOLIC PANEL
Anion gap: 11 (ref 5–15)
BUN: 83 mg/dL — ABNORMAL HIGH (ref 6–23)
CO2: 18 mmol/L — ABNORMAL LOW (ref 19–32)
Calcium: 6.1 mg/dL — CL (ref 8.4–10.5)
Chloride: 110 mmol/L (ref 96–112)
Creatinine, Ser: 3.7 mg/dL — ABNORMAL HIGH (ref 0.50–1.10)
GFR calc Af Amer: 12 mL/min — ABNORMAL LOW (ref >90)
GFR calc non Af Amer: 10 mL/min — ABNORMAL LOW (ref >90)
Glucose, Bld: 125 mg/dL — ABNORMAL HIGH (ref 70–99)
Potassium: 4.2 mmol/L (ref 3.5–5.1)
Sodium: 139 mmol/L (ref 135–145)

## 2014-04-12 MED ORDER — POLYETHYLENE GLYCOL 3350 17 G PO PACK: 17.0000 g | PACK | Freq: Two times a day (BID) | ORAL | Status: DC | PRN

## 2014-04-12 MED ORDER — DIPHENHYDRAMINE HCL 25 MG PO CAPS: 50.0000 mg | ORAL_CAPSULE | Freq: Every evening | ORAL | Status: DC | PRN

## 2014-04-12 MED ORDER — DOCUSATE SODIUM 100 MG PO CAPS: 200.0000 mg | ORAL_CAPSULE | Freq: Two times a day (BID) | ORAL | Status: DC | PRN

## 2014-04-12 MED ORDER — ZOLPIDEM TARTRATE 5 MG PO TABS
5.0000 mg | ORAL_TABLET | Freq: Every evening | ORAL | Status: DC | PRN
  Administered 2014-04-12: 5 mg via ORAL
  Filled 2014-04-12: qty 1

## 2014-04-12 NOTE — Care Management Note (Addendum)
    Page 1 of 1   04/14/2014     11:05:46 AM CARE MANAGEMENT NOTE 04/14/2014  Patient:  LATORIA, LIZCANO   Account Number:  [id number]  Date Initiated:  04/12/2014  Documentation initiated by:  Theophilus Kinds  Subjective/Objective Assessment:   Pt admitted from home with generalized pain and ARF. Pt lives with her daughter and grandchildren and will return home at discharge. Pt is independent with ADL's.     Action/Plan:   PT reassess pt and no PT follow up needed. No CM needs anticipated.   Anticipated DC Date:  04/14/2014   Anticipated DC Plan:  [place]  CM consult      [facility] Choice  HOME HEALTH   Choice offered to / List presented to:  C-1 Patient        [place] arranged  HH-1 RN      [place].   Status of service:  Completed, signed off Medicare Important Message given?  YES (If response is "NO", the following Medicare IM given date fields will be blank) Date Medicare IM given:  04/14/2014 Medicare IM given by:  Theophilus Kinds Date Additional Medicare IM given:   Additional Medicare IM given by:    Discharge Disposition:  [place]  Per UR Regulation:    If discussed at Long Length of Stay Meetings, dates discussed:    Comments:  04/14/14 [place], RN BSN CM Pt discharged home today with [facility] RN (per pts choice). Romualdo Bolk of [facility] is aware and will collect the pts information from the chart. [place] services to start within 48 hours of discharge. No DME needs noted. Pt and pts nurse aware of discharge arrangements.  04/12/14 [place], RN BSN CM

## 2014-04-12 NOTE — Clinical Social Work Note (Signed)
PT over weekend recommended home health vs SNF, but pt is much improved today and no follow up needed. Pt plans to return home. CSW will sign off.  [name] [name], [name] [name]

## 2014-04-12 NOTE — Progress Notes (Signed)
[facility] PROGRESS NOTE  [name] [name] [id number] DOB: [birth year] DOA: 04/09/2014 PCP: PROVIDER NOT IN SYSTEM  Assessment/Plan: Back Pain/Gout Flare -Much improved/resolved with steroids and colchicine. -Has been ambulating without difficulty.  Fever -Resolved. -Suspect related to gout flare.  Acute on CKD Stage III-IV -Cr with marked increased since admission. -Suspect related to colchicine use. -colchicine discontinued and ARB/HCTZ on hold -started on IV fluids -Renal ultrasound without obstruction -If renal function does not improve, may need nephrology consult tomorrow  HTN -Well controlled. -Continue current regimen.  Atrial Fibrillation -Rate controlled. -Continue coumadin.  HLD -Continue statin.  Code Status: Full Code Family Communication: discussed with patient and daughter Disposition Plan: Home when ready   Consultants:  None   Antibiotics:  None   Subjective: Feels well, no complaints. Patient reports she was previously constipated, but with laxatives is having multiple bowel movements today  Objective: Filed Vitals:   04/11/14 1533 04/11/14 2309 04/12/14 0557 04/12/14 1427  BP: 107/69 110/68 120/74 132/63  Pulse: 113 80 77 93  Temp: 98.1 F (36.7 C) 97.9 F (36.6 C) 98.2 F (36.8 C) 98.1 F (36.7 C)  TempSrc: Oral Oral Oral   Resp: 20 20 18 18   Height:      Weight:      SpO2: 98% 97% 98% 97%    Intake/Output Summary (Last 24 hours) at 04/12/14 1853 Last data filed at 04/12/14 1832  Gross per 24 hour  Intake 2411.67 ml  Output    300 ml  Net 2111.67 ml   Filed Weights   04/09/14 2104 04/10/14 0500  Weight: 92.534 kg (204 lb) 92 kg (202 lb 13.2 oz)    Exam:   General:  AA Ox3  Cardiovascular: RRR  Respiratory: CTA B  Abdomen: S/NT/ND/+BS  Extremities: no C/C/E   Neurologic:  Intact/non-focal  Data Reviewed: Basic Metabolic Panel:  Recent Labs Lab 04/09/14 2200 04/10/14 0442 04/11/14 0522  04/12/14 0459  NA 141 141 138 139  K 3.2* 3.5 4.4 4.2  CL 107 106 108 110  CO2 24 23 20  18*  GLUCOSE 149* 156* 150* 125*  BUN 31* 37* 60* 83*  CREATININE 1.81* 2.17* 3.71* 3.70*  CALCIUM 7.2* 6.9*  6.7* 6.2* 6.1*   Liver Function Tests:  Recent Labs Lab 04/09/14 2200 04/11/14 0706  AST 36  --   ALT 50*  --   ALKPHOS 73  --   BILITOT 0.8  --   PROT 7.2  --   ALBUMIN 3.1* 2.6*   No results for input(s): LIPASE, AMYLASE in the last 168 hours. No results for input(s): AMMONIA in the last 168 hours. CBC:  Recent Labs Lab 04/09/14 2200 04/10/14 0442 04/11/14 0522  WBC 15.1* 20.1* 15.4*  NEUTROABS 12.8*  --   --   HGB 10.7* 9.9* 8.7*  HCT 33.7* 32.2* 28.6*  MCV 83.0 85.4 85.6  PLT 225 229 175   Cardiac Enzymes: No results for input(s): CKTOTAL, CKMB, CKMBINDEX, TROPONINI in the last 168 hours. BNP (last 3 results) No results for input(s): BNP in the last 8760 hours.  ProBNP (last 3 results) No results for input(s): PROBNP in the last 8760 hours.  CBG: No results for input(s): GLUCAP in the last 168 hours.  Recent Results (from the past 240 hour(s))  MRSA PCR Screening     Status: None   Collection Time: 04/10/14  2:53 AM  Result Value Ref Range Status   MRSA by PCR NEGATIVE NEGATIVE Final  Comment:        The GeneXpert MRSA Assay (FDA approved for NASAL specimens only), is one component of a comprehensive MRSA colonization surveillance program. It is not intended to diagnose MRSA infection nor to guide or monitor treatment for MRSA infections.   Culture, blood (routine x 2)     Status: None (Preliminary result)   Collection Time: 04/10/14 10:17 AM  Result Value Ref Range Status   Specimen Description BLOOD LEFT HAND  Final   Special Requests BOTTLES DRAWN AEROBIC ONLY 6CC  Final   Culture NO GROWTH 2 DAYS  Final   Report Status PENDING  Incomplete  Culture, blood (routine x 2)     Status: None (Preliminary result)   Collection Time: 04/10/14  10:18 AM  Result Value Ref Range Status   Specimen Description BLOOD RIGHT HAND  Final   Special Requests BOTTLES DRAWN AEROBIC ONLY [place]  Final   Culture NO GROWTH 2 DAYS  Final   Report Status PENDING  Incomplete     Studies: US Renal  04/12/2014   CLINICAL DATA:  Acute renal failure, hypertension, CHF, atrial fibrillation  EXAM: RENAL/URINARY TRACT ULTRASOUND COMPLETE  COMPARISON:  09/17/2010 renal ultrasound, CT abdomen and pelvis without contrast 09/16/2010  FINDINGS: Right Kidney:  Length: 11.8 cm. Normal cortical thickness and echogenicity. No mass, hydronephrosis or shadowing calcification. Minimal perinephric fluid at inferior pole.  Left Kidney:  Length: 10.0 cm. Normal cortical thickness and echogenicity. Tiny hypoechoic nodule at inferior pole question tiny cyst, 9 x 9 x 10 mm, not definitely seen on previous exam.  Bladder:  Appears normal for degree of bladder distention.  IMPRESSION: Questionable tiny cyst at inferior pole LEFT kidney.  Otherwise negative exam.   Electronically Signed   By: Lavonia Dana M.D.   On: 04/12/2014 11:01    Scheduled Meds: . carvedilol  3.125 mg Oral BID WC  . fluticasone  1 spray Each Nare Daily  . pantoprazole  40 mg Oral Daily  . pravastatin  20 mg Oral q1800  . Warfarin - Pharmacist Dosing Inpatient   Does not apply Q24H   Continuous Infusions: . sodium chloride 100 mL/hr at 04/12/14 1327    Principal Problem:   Back pain Active Problems:   ARF (acute renal failure)   Chronic kidney disease (CKD), stage IV (severe)   Chronic a-fib   Warfarin anticoagulation   Hypocalcemia   Gout attack   Fever    Time spent: 35 minutes. Greater than 50% of this time was spent in direct contact with the patient coordinating care.    Harshini Trent  [facility] Pager [phone]  If 7PM-7AM, please contact night-coverage at [email or web address], password [facility] 04/12/2014, 6:53 PM  LOS: 0 days

## 2014-04-12 NOTE — Progress Notes (Signed)
Physical Therapy Treatment Patient Details Name: [name] [name] MRN: [id number] DOB: 09-10-1930 Today's Date: 04/12/2014    History of Present Illness 81 yo female h/o ckd, htn, afib on coumadin, chf, h/o gib comes in with severe lumbar back pain worse than Normal cannot get out of bed at all today. Has been dealing with a gouty flare, but more joints hurt than normal. Her dtr went to check on her, pt could not get out of bed, so they came to ED. Has been congested, no cough. No fevers. No dysuria. No n/v/d. No cp or abd pain. No rashes. No recent trauma. No swelling in legs. Her back occasionally gets bad but not this bad. Was not aware of any fever, temp over 101 in ED. No sick contacts. Asked to obs for w/u of her back pain and fever. Only dilaudid 0.5 mg iv given in ED for pain, which was not at all effective per patient. States she hurts "all over" but mainly in lower back.    PT Comments    Pt was seen for gait training.  She reports no back pain and states that the gout in her feet is much better.  There was no erythema or heat in either foot and she has no pain with weight bearing.  She requires min assist to transfer to sitting and no assist to stand.  She was able to ambulate with walker 150' with good stability.  She does describe mild soreness in the L sacroiliac joint during gait.  Pt became dyspneic with gait.  O2 sat=85% on room air.  O2 had been turned on in the room so cannula was reapplied at 2 L /min.  Follow Up Recommendations  No PT follow up     Equipment Recommendations  None recommended by PT    Recommendations for Other Services  none     Precautions / Restrictions Precautions Precautions: None Restrictions Weight Bearing Restrictions: No    Mobility  Bed Mobility Overal bed mobility: Needs Assistance Bed Mobility: Supine to Sit     Supine to sit: Min assist        Transfers Overall transfer level: Modified independent Equipment used:  Rolling walker (2 wheeled) Transfers: Sit to/from Stand Sit to Stand: Supervision            Ambulation/Gait Ambulation/Gait assistance: Modified independent (Device/Increase time) Ambulation Distance (Feet): 150 Feet Assistive device: Rolling walker (2 wheeled) Gait Pattern/deviations: WFL(Within Functional Limits)   Gait velocity interpretation: at or above normal speed for age/gender     Stairs            Wheelchair Mobility    Modified Rankin (Stroke Patients Only)       Balance   Sitting-balance support: No upper extremity supported;Feet supported Sitting balance-Leahy Scale: Normal     Standing balance support: Bilateral upper extremity supported Standing balance-Leahy Scale: Good                      Cognition Arousal/Alertness: Awake/alert Behavior During Therapy: WFL for tasks assessed/performed Overall Cognitive Status: Within Functional Limits for tasks assessed                      Exercises      General Comments        Pertinent Vitals/Pain Pain Assessment: No/denies pain    Home Living  Prior Function            PT Goals (current goals can now be found in the care plan section) Progress towards PT goals: Goals met/education completed, patient discharged from PT    Frequency       PT Plan Discharge plan needs to be updated    Co-evaluation             End of Session Equipment Utilized During Treatment: Gait belt Activity Tolerance: Patient tolerated treatment well Patient left: in chair;with chair alarm set;with call bell/phone within reach;with family/visitor present     Time: 1110-1131 PT Time Calculation (min) (ACUTE ONLY): 21 min  Charges:  $Gait Training: 8-22 mins                    G Codes:      [name] 2014/04/27, 11:36 AM

## 2014-04-12 NOTE — Progress Notes (Signed)
[place] for Coumadin Indication: atrial fibrillation  Allergies  Allergen Reactions  . Codeine Nausea And Vomiting  . Sulfa Antibiotics Nausea And Vomiting    Patient Measurements: Height: 5\' 5"  (165.1 cm) Weight: 202 lb 13.2 oz (92 kg) IBW/kg (Calculated) : 57  Vital Signs: Temp: 98.2 F (36.8 C) (03/07 0557) Temp Source: Oral (03/07 0557) BP: 120/74 mmHg (03/07 0557) Pulse Rate: 77 (03/07 0557)  Labs:  Recent Labs  04/09/14 2200 04/10/14 0013 04/10/14 0442 04/11/14 0522 04/12/14 0459  HGB 10.7*  --  9.9* 8.7*  --   HCT 33.7*  --  32.2* 28.6*  --   PLT 225  --  229 175  --   LABPROT  --  43.2*  --  49.4* 38.6*  INR  --  4.52*  --  5.45* 3.91*  CREATININE 1.81*  --  2.17* 3.71* 3.70*    Estimated Creatinine Clearance: 13.1 mL/min (by C-G formula based on Cr of 3.7).   Medical History: Past Medical History  Diagnosis Date  . Atrial fibrillation   . Hypertension   . Hypercholesterolemia   . Seasonal allergies   . Acid reflux   . CHF (congestive heart failure)     Hx of diastolic CHF.  Marland Kitchen Gout   . Diverticular hemorrhage 2011    Per colonoscopy  . Thrombocytopenia due to drugs     Allopurinol  . CKD (chronic kidney disease) stage 3, GFR 30-59 ml/min   . Hyperglycemia     Query DM 2  . Chronic anticoagulation   . Diverticulosis 2011    Per colonoscopy  . Internal hemorrhoids 2011    Per colonoscopy  . GI bleeding 08/03/2011    Not scoped but likely secondary to hemorrhoids or diverticulosis  . ARF (acute renal failure) 09/16/2010    Prerenal. Creatinine 1.36 before discharge.  Marland Kitchen Hx of medication noncompliance     Medications:  Prescriptions prior to admission  Medication Sig Dispense Refill Last Dose  . Calcium Carbonate-Vitamin D (CALCIUM 500 + D PO) Take 1 tablet by mouth daily.   Past Week at Unknown time  . carvedilol (COREG) 12.5 MG tablet Take 12.5 mg by mouth 2 (two) times daily with a meal.   04/09/2014  at Unknown time  . colchicine 0.6 MG tablet Take 1 tablet (0.6 mg total) by mouth daily as needed (Gout). (Patient taking differently: Take 0.6 mg by mouth daily. )   04/09/2014 at Unknown time  . diltiazem (DILACOR XR) 120 MG 24 hr capsule Take 120 mg by mouth daily.     04/09/2014 at Unknown time  . fluticasone (FLONASE) 50 MCG/ACT nasal spray Place 1 spray into both nostrils daily.   Past Week at Unknown time  . furosemide (LASIX) 20 MG tablet Decrease the dose to one tablet once daily as needed for swelling. (Patient taking differently: Take 20 mg by mouth every other day. Decrease the dose to one tablet once daily as needed for swelling.)   04/08/2014 at Unknown time  . hydrALAZINE (APRESOLINE) 50 MG tablet Take 50 mg by mouth 3 (three) times daily.   04/09/2014 at morning  . HYDROcodone-acetaminophen (NORCO/VICODIN) 5-325 MG per tablet Take 1 tablet by mouth 2 (two) times daily as needed for moderate pain.   04/09/2014 at Unknown time  . lovastatin (MEVACOR) 40 MG tablet Take 40 mg by mouth at bedtime.     04/08/2014 at Unknown time  . mirtazapine (REMERON) 30 MG tablet Take  30 mg by mouth at bedtime.     04/08/2014  . montelukast (SINGULAIR) 10 MG tablet Take 10 mg by mouth every morning.     04/09/2014 at Unknown time  . Multiple Vitamin (MULTIVITAMIN WITH MINERALS) TABS Take 1 tablet by mouth daily.   04/09/2014 at Unknown time  . polyethylene glycol (MIRALAX / GLYCOLAX) packet Take 17 g by mouth daily.   04/08/2014  . traMADol (ULTRAM) 50 MG tablet Take 50 mg by mouth every 6 (six) hours as needed. For pain   04/09/2014 at Unknown time  . traZODone (DESYREL) 50 MG tablet Take 25-50 mg by mouth at bedtime as needed for sleep.   04/08/2014 at Unknown time  . warfarin (COUMADIN) 3 MG tablet Take 2-3 mg by mouth See admin instructions. Take 3mg  on Mondays through Fridays, then take 2mg  on Saturdays and Sundays   04/08/2014 at Unknown time  . omeprazole (PRILOSEC) 20 MG capsule Take 1 capsule (20 mg total) by mouth daily.  For acid reflux. 30 capsule 2 Taking  . valsartan-hydrochlorothiazide (DIOVAN HCT) 80-12.5 MG per tablet Take 1 tablet by mouth daily. For treatment of high blood pressure. 30 tablet 2 Taking    Assessment: 81 yo F on chronic Coumadin for Afib.  Home dose listed above.  INR supra-therapeutic, but has trended down today.  Last Coumadin dose was on 3/3.  No bleeding noted.    Goal of Therapy:  INR 2-3   Plan:  Hold Coumadin today for elevated INR Daily INR  Reeve Mallo, Lavonia Drafts 04/12/2014,7:52 AM

## 2014-04-12 NOTE — Progress Notes (Signed)
Lab notified nurse of critical calcium level this am of 6.1. Notified Dr Shanon Brow and will continue to monitor.

## 2014-04-13 LAB — BASIC METABOLIC PANEL
Anion gap: 10 (ref 5–15)
BUN: 74 mg/dL — ABNORMAL HIGH (ref 6–23)
CO2: 18 mmol/L — ABNORMAL LOW (ref 19–32)
Calcium: 6.7 mg/dL — ABNORMAL LOW (ref 8.4–10.5)
Chloride: 115 mmol/L — ABNORMAL HIGH (ref 96–112)
Creatinine, Ser: 2.4 mg/dL — ABNORMAL HIGH (ref 0.50–1.10)
GFR calc Af Amer: 21 mL/min — ABNORMAL LOW (ref >90)
GFR calc non Af Amer: 18 mL/min — ABNORMAL LOW (ref >90)
Glucose, Bld: 89 mg/dL (ref 70–99)
Potassium: 4 mmol/L (ref 3.5–5.1)
Sodium: 143 mmol/L (ref 135–145)

## 2014-04-13 LAB — PROTIME-INR
INR: 2.55 — ABNORMAL HIGH (ref 0.00–1.49)
Prothrombin Time: 27.6 seconds — ABNORMAL HIGH (ref 11.6–15.2)

## 2014-04-13 MED ORDER — WARFARIN SODIUM 2 MG PO TABS
2.0000 mg | ORAL_TABLET | Freq: Once | ORAL | Status: AC
  Administered 2014-04-13: 2 mg via ORAL
  Filled 2014-04-13: qty 1

## 2014-04-13 NOTE — Progress Notes (Signed)
[name] [name] for Coumadin Indication: atrial fibrillation  Allergies  Allergen Reactions  . Codeine Nausea And Vomiting  . Sulfa Antibiotics Nausea And Vomiting   Patient Measurements: Height: 5\' 5"  (165.1 cm) Weight: 202 lb 13.2 oz (92 kg) IBW/kg (Calculated) : 57  Vital Signs: Temp: 98.4 F (36.9 C) (03/08 0504) Temp Source: Oral (03/08 0504) BP: 127/61 mmHg (03/08 1007) Pulse Rate: 99 (03/08 1007)  Labs:  Recent Labs  04/11/14 0522 04/12/14 0459 04/13/14 0644  HGB 8.7*  --   --   HCT 28.6*  --   --   PLT 175  --   --   LABPROT 49.4* 38.6* 27.6*  INR 5.45* 3.91* 2.55*  CREATININE 3.71* 3.70* 2.40*   Estimated Creatinine Clearance: 20.3 mL/min (by C-G formula based on Cr of 2.4).  Medical History: Past Medical History  Diagnosis Date  . Atrial fibrillation   . Hypertension   . Hypercholesterolemia   . Seasonal allergies   . Acid reflux   . CHF (congestive heart failure)     Hx of diastolic CHF.  Marland Kitchen Gout   . Diverticular hemorrhage 2011    Per colonoscopy  . Thrombocytopenia due to drugs     Allopurinol  . CKD (chronic kidney disease) stage 3, GFR 30-59 ml/min   . Hyperglycemia     Query DM 2  . Chronic anticoagulation   . Diverticulosis 2011    Per colonoscopy  . Internal hemorrhoids 2011    Per colonoscopy  . GI bleeding 08/03/2011    Not scoped but likely secondary to hemorrhoids or diverticulosis  . ARF (acute renal failure) 09/16/2010    Prerenal. Creatinine 1.36 before discharge.  Marland Kitchen Hx of medication noncompliance    Medications:  Prescriptions prior to admission  Medication Sig Dispense Refill Last Dose  . Calcium Carbonate-Vitamin D (CALCIUM 500 + D PO) Take 1 tablet by mouth daily.   Past Week at Unknown time  . carvedilol (COREG) 12.5 MG tablet Take 12.5 mg by mouth 2 (two) times daily with a meal.   04/09/2014 at Unknown time  . colchicine 0.6 MG tablet Take 1 tablet (0.6 mg total) by mouth daily as needed  (Gout). (Patient taking differently: Take 0.6 mg by mouth daily. )   04/09/2014 at Unknown time  . diltiazem (DILACOR XR) 120 MG 24 hr capsule Take 120 mg by mouth daily.     04/09/2014 at Unknown time  . fluticasone (FLONASE) 50 MCG/ACT nasal spray Place 1 spray into both nostrils daily.   Past Week at Unknown time  . furosemide (LASIX) 20 MG tablet Decrease the dose to one tablet once daily as needed for swelling. (Patient taking differently: Take 20 mg by mouth every other day. Decrease the dose to one tablet once daily as needed for swelling.)   04/08/2014 at Unknown time  . hydrALAZINE (APRESOLINE) 50 MG tablet Take 50 mg by mouth 3 (three) times daily.   04/09/2014 at morning  . HYDROcodone-acetaminophen (NORCO/VICODIN) 5-325 MG per tablet Take 1 tablet by mouth 2 (two) times daily as needed for moderate pain.   04/09/2014 at Unknown time  . lovastatin (MEVACOR) 40 MG tablet Take 40 mg by mouth at bedtime.     04/08/2014 at Unknown time  . mirtazapine (REMERON) 30 MG tablet Take 30 mg by mouth at bedtime.     04/08/2014  . montelukast (SINGULAIR) 10 MG tablet Take 10 mg by mouth every morning.     04/09/2014  at Unknown time  . Multiple Vitamin (MULTIVITAMIN WITH MINERALS) TABS Take 1 tablet by mouth daily.   04/09/2014 at Unknown time  . polyethylene glycol (MIRALAX / GLYCOLAX) packet Take 17 g by mouth daily.   04/08/2014  . traMADol (ULTRAM) 50 MG tablet Take 50 mg by mouth every 6 (six) hours as needed. For pain   04/09/2014 at Unknown time  . traZODone (DESYREL) 50 MG tablet Take 25-50 mg by mouth at bedtime as needed for sleep.   04/08/2014 at Unknown time  . warfarin (COUMADIN) 3 MG tablet Take 2-3 mg by mouth See admin instructions. Take 3mg  on Mondays through Fridays, then take 2mg  on Saturdays and Sundays   04/08/2014 at Unknown time  . omeprazole (PRILOSEC) 20 MG capsule Take 1 capsule (20 mg total) by mouth daily. For acid reflux. 30 capsule 2 Taking  . valsartan-hydrochlorothiazide (DIOVAN HCT) 80-12.5 MG per  tablet Take 1 tablet by mouth daily. For treatment of high blood pressure. 30 tablet 2 Taking    Assessment: 81 yo F on chronic Coumadin for Afib.  Home dose listed above.  INR has trended down to therapeutic range.  Last Coumadin dose was on 3/3.  No bleeding noted.    Goal of Therapy:  INR 2-3   Plan:  Coumadin 2mg  po today x 1 Daily INR  [name], Akylah Hascall A 04/13/2014,11:54 AM

## 2014-04-13 NOTE — Progress Notes (Signed)
[facility] PROGRESS NOTE  TEMPESTT FAGERSTROM [id number] DOB: [birth year] DOA: 04/09/2014 PCP: PROVIDER NOT IN SYSTEM  Assessment/Plan: Back Pain/Gout Flare -Much improved/resolved with steroids and colchicine. -Has been ambulating without difficulty.  Fever -Resolved. -Suspect related to gout flare.  Acute on CKD Stage III-IV -Cr with marked increased since admission. -Suspect related to colchicine use. -colchicine discontinued and ARB/HCTZ on hold -started on IV fluids with improvement of creatinine -Renal ultrasound without obstruction -We'll continue treatments for 1 more day, anticipate she can discharge home tomorrow  HTN -Well controlled. -Continue current regimen.  Atrial Fibrillation -Rate controlled. -Continue coumadin.  HLD -Continue statin.  Code Status: Full Code Family Communication: discussed with patient and daughter Disposition Plan: Home when ready, likely in am   Consultants:  None   Antibiotics:  None   Subjective: Feels well, no complaints. Wants to go home.  Objective: Filed Vitals:   04/12/14 2105 04/13/14 0504 04/13/14 1007 04/13/14 1547  BP: 125/65 134/79 127/61 142/63  Pulse: 92 87 99 95  Temp: 98.2 F (36.8 C) 98.4 F (36.9 C)  98 F (36.7 C)  TempSrc: Oral Oral    Resp: 20 20  20   Height:      Weight:      SpO2: 100% 96%  98%    Intake/Output Summary (Last 24 hours) at 04/13/14 1748 Last data filed at 04/13/14 1741  Gross per 24 hour  Intake   3477 ml  Output    200 ml  Net   3277 ml   Filed Weights   04/09/14 2104 04/10/14 0500  Weight: 92.534 kg (204 lb) 92 kg (202 lb 13.2 oz)    Exam:   General:  AA Ox3  Cardiovascular: RRR  Respiratory: CTA B  Abdomen: S/NT/ND/+BS  Extremities: no C/C/E   Neurologic:  Intact/non-focal  Data Reviewed: Basic Metabolic Panel:  Recent Labs Lab 04/09/14 2200 04/10/14 0442 04/11/14 0522 04/12/14 0459 04/13/14 0644  NA 141 141 138 139 143  K 3.2*  3.5 4.4 4.2 4.0  CL 107 106 108 110 115*  CO2 24 23 20  18* 18*  GLUCOSE 149* 156* 150* 125* 89  BUN 31* 37* 60* 83* 74*  CREATININE 1.81* 2.17* 3.71* 3.70* 2.40*  CALCIUM 7.2* 6.9*  6.7* 6.2* 6.1* 6.7*   Liver Function Tests:  Recent Labs Lab 04/09/14 2200 04/11/14 0706  AST 36  --   ALT 50*  --   ALKPHOS 73  --   BILITOT 0.8  --   PROT 7.2  --   ALBUMIN 3.1* 2.6*   No results for input(s): LIPASE, AMYLASE in the last 168 hours. No results for input(s): AMMONIA in the last 168 hours. CBC:  Recent Labs Lab 04/09/14 2200 04/10/14 0442 04/11/14 0522  WBC 15.1* 20.1* 15.4*  NEUTROABS 12.8*  --   --   HGB 10.7* 9.9* 8.7*  HCT 33.7* 32.2* 28.6*  MCV 83.0 85.4 85.6  PLT 225 229 175   Cardiac Enzymes: No results for input(s): CKTOTAL, CKMB, CKMBINDEX, TROPONINI in the last 168 hours. BNP (last 3 results) No results for input(s): BNP in the last 8760 hours.  ProBNP (last 3 results) No results for input(s): PROBNP in the last 8760 hours.  CBG: No results for input(s): GLUCAP in the last 168 hours.  Recent Results (from the past 240 hour(s))  MRSA PCR Screening     Status: None   Collection Time: 04/10/14  2:53 AM  Result Value Ref Range Status  MRSA by PCR NEGATIVE NEGATIVE Final    Comment:        The GeneXpert MRSA Assay (FDA approved for NASAL specimens only), is one component of a comprehensive MRSA colonization surveillance program. It is not intended to diagnose MRSA infection nor to guide or monitor treatment for MRSA infections.   Culture, blood (routine x 2)     Status: None (Preliminary result)   Collection Time: 04/10/14 10:17 AM  Result Value Ref Range Status   Specimen Description BLOOD LEFT HAND  Final   Special Requests BOTTLES DRAWN AEROBIC ONLY 6CC  Final   Culture NO GROWTH 3 DAYS  Final   Report Status PENDING  Incomplete  Culture, blood (routine x 2)     Status: None (Preliminary result)   Collection Time: 04/10/14 10:18 AM  Result  Value Ref Range Status   Specimen Description BLOOD RIGHT HAND  Final   Special Requests BOTTLES DRAWN AEROBIC ONLY [place]  Final   Culture NO GROWTH 3 DAYS  Final   Report Status PENDING  Incomplete     Studies: US Renal  04/12/2014   CLINICAL DATA:  Acute renal failure, hypertension, CHF, atrial fibrillation  EXAM: RENAL/URINARY TRACT ULTRASOUND COMPLETE  COMPARISON:  09/17/2010 renal ultrasound, CT abdomen and pelvis without contrast 09/16/2010  FINDINGS: Right Kidney:  Length: 11.8 cm. Normal cortical thickness and echogenicity. No mass, hydronephrosis or shadowing calcification. Minimal perinephric fluid at inferior pole.  Left Kidney:  Length: 10.0 cm. Normal cortical thickness and echogenicity. Tiny hypoechoic nodule at inferior pole question tiny cyst, 9 x 9 x 10 mm, not definitely seen on previous exam.  Bladder:  Appears normal for degree of bladder distention.  IMPRESSION: Questionable tiny cyst at inferior pole LEFT kidney.  Otherwise negative exam.   Electronically Signed   By: Lavonia Dana M.D.   On: 04/12/2014 11:01    Scheduled Meds: . carvedilol  3.125 mg Oral BID WC  . fluticasone  1 spray Each Nare Daily  . pantoprazole  40 mg Oral Daily  . pravastatin  20 mg Oral q1800  . Warfarin - Pharmacist Dosing Inpatient   Does not apply Q24H   Continuous Infusions: . sodium chloride 100 mL/hr at 04/12/14 2340    Principal Problem:   Back pain Active Problems:   ARF (acute renal failure)   Chronic kidney disease (CKD), stage IV (severe)   Chronic a-fib   Warfarin anticoagulation   Hypocalcemia   Gout attack   Fever    Time spent: 35 minutes. Greater than 50% of this time was spent in direct contact with the patient coordinating care.    MEMON,JEHANZEB  [facility] Pager [phone]  If 7PM-7AM, please contact night-coverage at [email or web address], password [facility] 04/13/2014, 5:48 PM  LOS: 1 day

## 2014-04-14 DIAGNOSIS — N179 Acute kidney failure, unspecified: Secondary | ICD-10-CM | POA: Insufficient documentation

## 2014-04-14 DIAGNOSIS — R509 Fever, unspecified: Secondary | ICD-10-CM | POA: Insufficient documentation

## 2014-04-14 LAB — BASIC METABOLIC PANEL
Anion gap: 9 (ref 5–15)
BUN: 48 mg/dL — ABNORMAL HIGH (ref 6–23)
CO2: 19 mmol/L (ref 19–32)
Calcium: 7 mg/dL — ABNORMAL LOW (ref 8.4–10.5)
Chloride: 118 mmol/L — ABNORMAL HIGH (ref 96–112)
Creatinine, Ser: 1.49 mg/dL — ABNORMAL HIGH (ref 0.50–1.10)
GFR calc Af Amer: 37 mL/min — ABNORMAL LOW (ref >90)
GFR calc non Af Amer: 32 mL/min — ABNORMAL LOW (ref >90)
Glucose, Bld: 170 mg/dL — ABNORMAL HIGH (ref 70–99)
Potassium: 4.1 mmol/L (ref 3.5–5.1)
Sodium: 146 mmol/L — ABNORMAL HIGH (ref 135–145)

## 2014-04-14 LAB — CBC
HCT: 27.2 % — ABNORMAL LOW (ref 36.0–46.0)
Hemoglobin: 8.4 g/dL — ABNORMAL LOW (ref 12.0–15.0)
MCH: 25.7 pg — ABNORMAL LOW (ref 26.0–34.0)
MCHC: 30.9 g/dL (ref 30.0–36.0)
MCV: 83.2 fL (ref 78.0–100.0)
Platelets: 211 10*3/uL (ref 150–400)
RBC: 3.27 MIL/uL — ABNORMAL LOW (ref 3.87–5.11)
RDW: 17.1 % — ABNORMAL HIGH (ref 11.5–15.5)
WBC: 4.5 10*3/uL (ref 4.0–10.5)

## 2014-04-14 LAB — PROTIME-INR
INR: 2.27 — ABNORMAL HIGH (ref 0.00–1.49)
Prothrombin Time: 25.2 seconds — ABNORMAL HIGH (ref 11.6–15.2)

## 2014-04-14 MED ORDER — WARFARIN SODIUM 5 MG PO TABS: 2.5000 mg | ORAL_TABLET | Freq: Once | ORAL | Status: DC

## 2014-04-14 MED ORDER — OMEPRAZOLE 20 MG PO CPDR: 20.0000 mg | DELAYED_RELEASE_CAPSULE | Freq: Every day | ORAL | Status: DC

## 2014-04-14 NOTE — Discharge Summary (Signed)
Physician Discharge Summary  [name] [name] [id number] DOB: 08-15-1930 DOA: 04/09/2014  PCP: PROVIDER NOT IN SYSTEM  Admit date: 04/09/2014 Discharge date: 04/14/2014  Time spent: 40 minutes  Recommendations for Outpatient Follow-up:  1. Follow-up with primary care physician in 1-2 weeks. She will need repeat basic metabolic panel to assess her renal function at the time of visit 2. Diltiazem, hydralazine, valsartan/hydrochlorothiazide has been held due to normotensive blood pressures. Lasix has been changed to an as-needed basis. This can certainly be resumed in the outpatient setting as her clinical condition allows.  Discharge Diagnoses:  Principal Problem:   Back pain Active Problems:   ARF (acute renal failure)   Chronic kidney disease (CKD), stage IV (severe)   Chronic a-fib   Warfarin anticoagulation   Hypocalcemia   Gout attack   Fever   Acute renal failure syndrome   Febrile illness   Discharge Condition: improved  Diet recommendation: low salt  Filed Weights   04/09/14 2104 04/10/14 0500  Weight: 92.534 kg (204 lb) 92 kg (202 lb 13.2 oz)    History of present illness:  This patient presents to the hospital with severe lumbar back pain. She also had fever. He was concerned that she may have an underlying discitis and was admitted to the hospital under observation.  Hospital Course:  Patient underwent MRI of her lumbar spine that did not show any evidence of discitis. It was felt that her joint pains are likely related to a gouty flare. She was started on colchicine with significant improvement of her symptoms. Unfortunately she had worsening of her creatinine consistent with acute renal failure. Renal ultrasound did not show any acute findings. Patient was hydrated with IV fluids and her renal function has since improved to near baseline. Blood pressures were normotensive and there were several of her antihypertensives have been held. These be resumed in the outpatient  setting. The patient is feeling significantly improved at this time is a lot of complaining of any pains. Renal function is approaching baseline and she feels ready to discharge home.  Procedures:    Consultations:    Discharge Exam: Filed Vitals:   04/14/14 0700  BP: 127/57  Pulse: 97  Temp: 98.5 F (36.9 C)  Resp: 20    General: NAD Cardiovascular: S1, S2 RRR Respiratory: CTA B  Discharge Instructions   Discharge Instructions    Call MD for:  difficulty breathing, headache or visual disturbances    Complete by:  As directed      Call MD for:  severe uncontrolled pain    Complete by:  As directed      Diet - low sodium heart healthy    Complete by:  As directed      Face-to-face encounter (required for Medicare/Medicaid patients)    Complete by:  As directed   I MEMON,JEHANZEB certify that this patient is under my care and that I, or a nurse practitioner or physician's assistant working with me, had a face-to-face encounter that meets the physician face-to-face encounter requirements with this patient on 04/14/2014. The encounter with the patient was in whole, or in part for the following medical condition(s) which is the primary reason for home health care (List medical condition): admitted with gout attack and renal failure. Needs home health RN for follow up  The encounter with the patient was in whole, or in part, for the following medical condition, which is the primary reason for home health care:  acute renal failure  I certify  that, based on my findings, the following services are medically necessary home health services:  Nursing  Reason for Medically Necessary Home Health Services:  Skilled Nursing- Change/Decline in Patient Status  My clinical findings support the need for the above services:  Unable to leave home safely without assistance and/or assistive device  Further, I certify that my clinical findings support that this patient is homebound due to:  Unable to  leave home safely without assistance     Home Health    Complete by:  As directed   To provide the following care/treatments:  RN     Increase activity slowly    Complete by:  As directed           Discharge Medication List as of 04/14/2014  2:02 PM    CONTINUE these medications which have CHANGED   Details  omeprazole (PRILOSEC) 20 MG capsule Take 1 capsule (20 mg total) by mouth daily. For acid reflux., Starting 04/14/2014, Until Thu 04/14/15, Print      CONTINUE these medications which have NOT CHANGED   Details  Calcium Carbonate-Vitamin D (CALCIUM 500 + D PO) Take 1 tablet by mouth daily., Until Discontinued, Historical Med    carvedilol (COREG) 12.5 MG tablet Take 12.5 mg by mouth 2 (two) times daily with a meal., Until Discontinued, Historical Med    colchicine 0.6 MG tablet Take 1 tablet (0.6 mg total) by mouth daily as needed (Gout)., Starting 08/05/2011, Until Discontinued, No Print    fluticasone (FLONASE) 50 MCG/ACT nasal spray Place 1 spray into both nostrils daily., Until Discontinued, Historical Med    furosemide (LASIX) 20 MG tablet Decrease the dose to one tablet once daily as needed for swelling., No Print    HYDROcodone-acetaminophen (NORCO/VICODIN) 5-325 MG per tablet Take 1 tablet by mouth 2 (two) times daily as needed for moderate pain., Until Discontinued, Historical Med    lovastatin (MEVACOR) 40 MG tablet Take 40 mg by mouth at bedtime.  , Until Discontinued, Historical Med    mirtazapine (REMERON) 30 MG tablet Take 30 mg by mouth at bedtime.  , Until Discontinued, Historical Med    montelukast (SINGULAIR) 10 MG tablet Take 10 mg by mouth every morning.  , Until Discontinued, Historical Med    Multiple Vitamin (MULTIVITAMIN WITH MINERALS) TABS Take 1 tablet by mouth daily., Until Discontinued, Historical Med    polyethylene glycol (MIRALAX / GLYCOLAX) packet Take 17 g by mouth daily., Until Discontinued, Historical Med    traMADol (ULTRAM) 50 MG tablet Take  50 mg by mouth every 6 (six) hours as needed. For pain, Until Discontinued, Historical Med    traZODone (DESYREL) 50 MG tablet Take 25-50 mg by mouth at bedtime as needed for sleep., Until Discontinued, Historical Med    warfarin (COUMADIN) 3 MG tablet Take 2-3 mg by mouth See admin instructions. Take 3mg  on Mondays through Fridays, then take 2mg  on Saturdays and Sundays, Starting 08/05/2011, Until Discontinued, Historical Med      STOP taking these medications     diltiazem (DILACOR XR) 120 MG 24 hr capsule      hydrALAZINE (APRESOLINE) 50 MG tablet      valsartan-hydrochlorothiazide (DIOVAN HCT) 80-12.5 MG per tablet        Allergies  Allergen Reactions  . Codeine Nausea And Vomiting  . Sulfa Antibiotics Nausea And Vomiting   Follow-up Information    Follow up with [facility].   Contact information:   [address]  [place] [address] [phone]       Follow up with [facility] On 04/26/2014.   Why:  at 12:45 pm   Contact information:   [phone]       The results of significant diagnostics from this hospitalization (including imaging, microbiology, ancillary and laboratory) are listed below for reference.    Significant Diagnostic Studies: Dg Lumbar Spine Complete  04/10/2014   CLINICAL DATA:  Generalized abdominal pain  EXAM: LUMBAR SPINE - COMPLETE 4+ VIEW  COMPARISON:  Abdominal CT 09/16/2010  FINDINGS: Dilated bowel in the central abdomen suggesting small bowel obstruction. The patient has had small bowel obstruction in the past based on CT imaging. On this lumbar spine study, the bowel gas pattern is not completely visualized.  There is no evidence of lumbar spine fracture, endplate erosion, or acute subluxation. There is diffuse degenerative disc disease with advanced disc narrowing. Chronic retrolisthesis noted at L1-2 and L2-3, mild.  IMPRESSION: 1. Suspect small bowel obstruction. 2. No acute lumbar spine findings.  3. Diffuse degenerative disc disease.   Electronically Signed   By: Monte Fantasia M.D.   On: 04/10/2014 03:02   Mr Lumbar Spine Wo Contrast  04/10/2014   CLINICAL DATA:  Low back and bilateral leg pain for many years. Worsening symptoms over the last 3 months. No acute injury or prior relevant surgery. Initial encounter.  EXAM: MRI LUMBAR SPINE WITHOUT CONTRAST  TECHNIQUE: Multiplanar, multisequence MR imaging of the lumbar spine was performed. No intravenous contrast was administered.  COMPARISON:  Radiographs 04/10/2014. Abdominal pelvic CT 09/16/2010.  FINDINGS: CT demonstrates 5 lumbar type vertebral bodies. Convex left scoliosis and multilevel endplate degenerative changes are noted. There is no evidence of acute fracture or pars defect.  The conus medullaris extends to the L1-2 level and appears normal. No paraspinal abnormalities are identified.  L1-2: Chronic degenerative disc disease with loss of disc height, diffuse disc bulging and paraspinal osteophyte formation. Mild asymmetric narrowing of the right lateral recess and right foramen without gross nerve root encroachment.  L2-3: Chronic degenerative disc disease with loss of disc height, annular disc bulging and paraspinal osteophyte formation asymmetric to the right. Mild narrowing of both lateral recesses and the right foramen without gross nerve root encroachment.  L3-4: Lesser loss of disc height at this level with annular disc bulging. A small inferiorly migrated disc fragment on the prior CT is no longer visualized. Moderate facet and ligamentous hypertrophy contribute to mild spinal stenosis. No evidence of nerve root encroachment. There is some periarticular edema associated with the facet joints, worse on the left.  L4-5: Chronic degenerative disc disease with loss of disc height, annular disc bulging and endplate osteophytes asymmetric to the left. There is asymmetric left-sided facet and ligamentous hypertrophy. These factors contribute to  mild to moderate spinal stenosis. There is mild narrowing of the lateral recesses and left foramen.  L5-S1: Disc height and hydration are relatively maintained. Mild bilateral facet hypertrophy. No spinal stenosis or nerve root encroachment.  IMPRESSION: 1. Multilevel chronic degenerative disc disease from L1-2 through L4-5, similar to prior abdominal CT. 2. Mild resulting spinal stenosis at L3-4 and mild-to-moderate spinal stenosis at L4-5. Mild chronic narrowing of the lateral recesses and foramina as described above. 3. No definite acute findings.   Electronically Signed   By: Richardean Sale M.D.   On: 04/10/2014 12:17   US Renal  04/12/2014   CLINICAL DATA:  Acute renal failure, hypertension, CHF, atrial fibrillation  EXAM: RENAL/URINARY TRACT ULTRASOUND  COMPLETE  COMPARISON:  09/17/2010 renal ultrasound, CT abdomen and pelvis without contrast 09/16/2010  FINDINGS: Right Kidney:  Length: 11.8 cm. Normal cortical thickness and echogenicity. No mass, hydronephrosis or shadowing calcification. Minimal perinephric fluid at inferior pole.  Left Kidney:  Length: 10.0 cm. Normal cortical thickness and echogenicity. Tiny hypoechoic nodule at inferior pole question tiny cyst, 9 x 9 x 10 mm, not definitely seen on previous exam.  Bladder:  Appears normal for degree of bladder distention.  IMPRESSION: Questionable tiny cyst at inferior pole LEFT kidney.  Otherwise negative exam.   Electronically Signed   By: Lavonia Dana M.D.   On: 04/12/2014 11:01   Dg Chest Portable 1 View  04/09/2014   CLINICAL DATA:  Back pain. Body aches. CHF. Hypertension and pneumonia.  EXAM: PORTABLE CHEST - 1 VIEW  COMPARISON:  02/01/2014  FINDINGS: Midline trachea. Moderate to marked enlargement of the cardiopericardial silhouette. Atherosclerosis in the transverse aorta. No pleural effusion or pneumothorax. Pulmonary interstitial prominence is mild. No lobar consolidation. Thickening of the right minor fissure.  IMPRESSION: Cardiomegaly with  mild pulmonary venous congestion.  No evidence of pneumonia.  Aortic atherosclerosis.   Electronically Signed   By: Abigail Miyamoto M.D.   On: 04/09/2014 22:43   Dg Abd Portable 1v  04/10/2014   CLINICAL DATA:  Nausea. Diffuse pain. Gout. Diffuse abdominal pain.  EXAM: PORTABLE ABDOMEN - 1 VIEW  COMPARISON:  None.  FINDINGS: The bowel gas pattern is normal. No radio-opaque calculi or other significant radiographic abnormality are seen.  IMPRESSION: Negative.   Electronically Signed   By: Dereck Ligas M.D.   On: 04/10/2014 12:59    Microbiology: Recent Results (from the past 240 hour(s))  MRSA PCR Screening     Status: None   Collection Time: 04/10/14  2:53 AM  Result Value Ref Range Status   MRSA by PCR NEGATIVE NEGATIVE Final    Comment:        The GeneXpert MRSA Assay (FDA approved for NASAL specimens only), is one component of a comprehensive MRSA colonization surveillance program. It is not intended to diagnose MRSA infection nor to guide or monitor treatment for MRSA infections.   Culture, blood (routine x 2)     Status: None (Preliminary result)   Collection Time: 04/10/14 10:17 AM  Result Value Ref Range Status   Specimen Description BLOOD LEFT HAND  Final   Special Requests BOTTLES DRAWN AEROBIC ONLY 6CC  Final   Culture NO GROWTH 4 DAYS  Final   Report Status PENDING  Incomplete  Culture, blood (routine x 2)     Status: None (Preliminary result)   Collection Time: 04/10/14 10:18 AM  Result Value Ref Range Status   Specimen Description BLOOD RIGHT HAND  Final   Special Requests BOTTLES DRAWN AEROBIC ONLY 6CC  Final   Culture NO GROWTH 4 DAYS  Final   Report Status PENDING  Incomplete     Labs: Basic Metabolic Panel:  Recent Labs Lab 04/10/14 0442 04/11/14 0522 04/12/14 0459 04/13/14 0644 04/14/14 0605  NA 141 138 139 143 146*  K 3.5 4.4 4.2 4.0 4.1  CL 106 108 110 115* 118*  CO2 23 20 18* 18* 19  GLUCOSE 156* 150* 125* 89 170*  BUN 37* 60* 83* 74* 48*   CREATININE 2.17* 3.71* 3.70* 2.40* 1.49*  CALCIUM 6.9*  6.7* 6.2* 6.1* 6.7* 7.0*   Liver Function Tests:  Recent Labs Lab 04/09/14 2200 04/11/14 0706  AST 36  --  ALT 50*  --   ALKPHOS 73  --   BILITOT 0.8  --   PROT 7.2  --   ALBUMIN 3.1* 2.6*   No results for input(s): LIPASE, AMYLASE in the last 168 hours. No results for input(s): AMMONIA in the last 168 hours. CBC:  Recent Labs Lab 04/09/14 2200 04/10/14 0442 04/11/14 0522 04/14/14 0605  WBC 15.1* 20.1* 15.4* 4.5  NEUTROABS 12.8*  --   --   --   HGB 10.7* 9.9* 8.7* 8.4*  HCT 33.7* 32.2* 28.6* 27.2*  MCV 83.0 85.4 85.6 83.2  PLT 225 229 175 211   Cardiac Enzymes: No results for input(s): CKTOTAL, CKMB, CKMBINDEX, TROPONINI in the last 168 hours. BNP: BNP (last 3 results) No results for input(s): BNP in the last 8760 hours.  ProBNP (last 3 results) No results for input(s): PROBNP in the last 8760 hours.  CBG: No results for input(s): GLUCAP in the last 168 hours.     Signed:  MEMON,JEHANZEB  [facility] 04/14/2014, 8:12 PM

## 2014-04-14 NOTE — Progress Notes (Signed)
Laurene Footman discharged home with daughter and home health RN per MD order.  Discharge instructions reviewed and discussed with the patient and daughter at bedside, all questions and concerns answered. Copy of instructions and scripts given to patients daughter.    Medication List    STOP taking these medications        diltiazem 120 MG 24 hr capsule  Commonly known as:  DILACOR XR     hydrALAZINE 50 MG tablet  Commonly known as:  APRESOLINE     valsartan-hydrochlorothiazide 80-12.5 MG per tablet  Commonly known as:  DIOVAN HCT      TAKE these medications        CALCIUM 500 + D PO  Take 1 tablet by mouth daily.     carvedilol 12.5 MG tablet  Commonly known as:  COREG  Take 12.5 mg by mouth 2 (two) times daily with a meal.     colchicine 0.6 MG tablet  Take 1 tablet (0.6 mg total) by mouth daily as needed (Gout).     fluticasone 50 MCG/ACT nasal spray  Commonly known as:  FLONASE  Place 1 spray into both nostrils daily.     furosemide 20 MG tablet  Commonly known as:  LASIX  Decrease the dose to one tablet once daily as needed for swelling.     HYDROcodone-acetaminophen 5-325 MG per tablet  Commonly known as:  NORCO/VICODIN  Take 1 tablet by mouth 2 (two) times daily as needed for moderate pain.     lovastatin 40 MG tablet  Commonly known as:  MEVACOR  Take 40 mg by mouth at bedtime.     mirtazapine 30 MG tablet  Commonly known as:  REMERON  Take 30 mg by mouth at bedtime.     montelukast 10 MG tablet  Commonly known as:  SINGULAIR  Take 10 mg by mouth every morning.     multivitamin with minerals Tabs tablet  Take 1 tablet by mouth daily.     omeprazole 20 MG capsule  Commonly known as:  PRILOSEC  Take 1 capsule (20 mg total) by mouth daily. For acid reflux.     polyethylene glycol packet  Commonly known as:  MIRALAX / GLYCOLAX  Take 17 g by mouth daily.     traMADol 50 MG tablet  Commonly known as:  ULTRAM  Take 50 mg by mouth every 6 (six) hours  as needed. For pain     traZODone 50 MG tablet  Commonly known as:  DESYREL  Take 25-50 mg by mouth at bedtime as needed for sleep.     warfarin 3 MG tablet  Commonly known as:  COUMADIN  Take 2-3 mg by mouth See admin instructions. Take 3mg  on Mondays through Fridays, then take 2mg  on Saturdays and Sundays        Patients skin is clean, dry and intact, no evidence of skin break down. IV site discontinued and catheter remains intact. Site without signs and symptoms of complications. Dressing and pressure applied.  Patient escorted to car by Ubaldo Glassing, NT and Lovena Le, RN in a wheelchair,  no distress noted upon discharge.  Regino Bellow 04/14/2014 2:37 PM

## 2014-04-14 NOTE — Progress Notes (Signed)
[place] for Coumadin Indication: atrial fibrillation  Allergies  Allergen Reactions  . Codeine Nausea And Vomiting  . Sulfa Antibiotics Nausea And Vomiting   Patient Measurements: Height: 5\' 5"  (165.1 cm) Weight: 202 lb 13.2 oz (92 kg) IBW/kg (Calculated) : 57  Vital Signs: Temp: 98.5 F (36.9 C) (03/09 0700) Temp Source: Oral (03/09 0700) BP: 127/57 mmHg (03/09 0700) Pulse Rate: 97 (03/09 0700)  Labs:  Recent Labs  04/12/14 0459 04/13/14 0644 04/14/14 0605  HGB  --   --  8.4*  HCT  --   --  27.2*  PLT  --   --  211  LABPROT 38.6* 27.6* 25.2*  INR 3.91* 2.55* 2.27*  CREATININE 3.70* 2.40* 1.49*   Estimated Creatinine Clearance: 32.6 mL/min (by C-G formula based on Cr of 1.49).  Medical History: Past Medical History  Diagnosis Date  . Atrial fibrillation   . Hypertension   . Hypercholesterolemia   . Seasonal allergies   . Acid reflux   . CHF (congestive heart failure)     Hx of diastolic CHF.  Marland Kitchen Gout   . Diverticular hemorrhage 2011    Per colonoscopy  . Thrombocytopenia due to drugs     Allopurinol  . CKD (chronic kidney disease) stage 3, GFR 30-59 ml/min   . Hyperglycemia     Query DM 2  . Chronic anticoagulation   . Diverticulosis 2011    Per colonoscopy  . Internal hemorrhoids 2011    Per colonoscopy  . GI bleeding 08/03/2011    Not scoped but likely secondary to hemorrhoids or diverticulosis  . ARF (acute renal failure) 09/16/2010    Prerenal. Creatinine 1.36 before discharge.  Marland Kitchen Hx of medication noncompliance    Medications:  Prescriptions prior to admission  Medication Sig Dispense Refill Last Dose  . Calcium Carbonate-Vitamin D (CALCIUM 500 + D PO) Take 1 tablet by mouth daily.   Past Week at Unknown time  . carvedilol (COREG) 12.5 MG tablet Take 12.5 mg by mouth 2 (two) times daily with a meal.   04/09/2014 at Unknown time  . colchicine 0.6 MG tablet Take 1 tablet (0.6 mg total) by mouth daily as needed  (Gout). (Patient taking differently: Take 0.6 mg by mouth daily. )   04/09/2014 at Unknown time  . diltiazem (DILACOR XR) 120 MG 24 hr capsule Take 120 mg by mouth daily.     04/09/2014 at Unknown time  . fluticasone (FLONASE) 50 MCG/ACT nasal spray Place 1 spray into both nostrils daily.   Past Week at Unknown time  . furosemide (LASIX) 20 MG tablet Decrease the dose to one tablet once daily as needed for swelling. (Patient taking differently: Take 20 mg by mouth every other day. Decrease the dose to one tablet once daily as needed for swelling.)   04/08/2014 at Unknown time  . hydrALAZINE (APRESOLINE) 50 MG tablet Take 50 mg by mouth 3 (three) times daily.   04/09/2014 at morning  . HYDROcodone-acetaminophen (NORCO/VICODIN) 5-325 MG per tablet Take 1 tablet by mouth 2 (two) times daily as needed for moderate pain.   04/09/2014 at Unknown time  . lovastatin (MEVACOR) 40 MG tablet Take 40 mg by mouth at bedtime.     04/08/2014 at Unknown time  . mirtazapine (REMERON) 30 MG tablet Take 30 mg by mouth at bedtime.     04/08/2014  . montelukast (SINGULAIR) 10 MG tablet Take 10 mg by mouth every morning.     04/09/2014  at Unknown time  . Multiple Vitamin (MULTIVITAMIN WITH MINERALS) TABS Take 1 tablet by mouth daily.   04/09/2014 at Unknown time  . polyethylene glycol (MIRALAX / GLYCOLAX) packet Take 17 g by mouth daily.   04/08/2014  . traMADol (ULTRAM) 50 MG tablet Take 50 mg by mouth every 6 (six) hours as needed. For pain   04/09/2014 at Unknown time  . traZODone (DESYREL) 50 MG tablet Take 25-50 mg by mouth at bedtime as needed for sleep.   04/08/2014 at Unknown time  . warfarin (COUMADIN) 3 MG tablet Take 2-3 mg by mouth See admin instructions. Take 3mg  on Mondays through Fridays, then take 2mg  on Saturdays and Sundays   04/08/2014 at Unknown time  . valsartan-hydrochlorothiazide (DIOVAN HCT) 80-12.5 MG per tablet Take 1 tablet by mouth daily. For treatment of high blood pressure. 30 tablet 2 Taking  . [DISCONTINUED]  omeprazole (PRILOSEC) 20 MG capsule Take 1 capsule (20 mg total) by mouth daily. For acid reflux. 30 capsule 2 Taking   Assessment: 81 yo F on chronic Coumadin for Afib.  Home dose listed above.  INR has trended down to therapeutic range.  Last Coumadin dose was on 3/3.  No bleeding noted.    Goal of Therapy:  INR 2-3   Plan:  Coumadin 2.5mg  po today x 1 Daily INR  [name], [name] A 04/14/2014,1:43 PM

## 2014-04-15 LAB — CULTURE, BLOOD (ROUTINE X 2)
Culture: NO GROWTH
Culture: NO GROWTH

## 2014-04-15 NOTE — Progress Notes (Signed)
UR chart review completed.  

## 2014-05-25 NOTE — Consult Note (Signed)
PATIENT NAME:  KIM, CALVO MR#:  [id number] DATE OF BIRTH:  1930/03/06  DATE OF CONSULTATION:  10/12/2011  REFERRING PHYSICIAN:  Albertine Patricia, DPM and Dr Prescott Parma in Anesthesia.   CONSULTING PHYSICIAN:  Ayme Short D. Jackye Dever, MD  INDICATION: Bradycardia, atrial fibrillation with pauses postoperatively.    HISTORY OF PRESENT ILLNESS: Ms Elkind is a 81 year old female with known history of atrial fibrillation with significant medical problems including hypertension, atrial fibrillation, congestive heart failure, depression, gout, anemia, intestinal blockage in the past, mild obesity who is postoperative from toe surgery with Dr. Elvina Mattes. The patient has been followed by [facility] as well as sees a cardiologist that comes to see her out there. She has been treated for atrial fibrillation including long-term anticoagulation with Coumadin. She is rate controlled with Coreg and Cardizem. Blood pressure control with hydralazine. She has had no recent symptoms of syncope. She had some mild dizziness. No significant weakness, no fatigue. Denies any chest pain. Denies other cardiac history. She has been doing reasonably well until this surgery. Postoperatively she was found to have a heart rate dropping into the 30s with significant pauses and atrial fibrillation, which she states is asymptomatic. Because of bradycardia cardiology consultation was recommended after the patient was seen by Internal Medicine.  REVIEW OF SYSTEMS: No blackout spells or syncope. No nausea, vomiting. No fever. No chills. No sweats. No weight loss, no weight gain, hemoptysis, hematemesis. No bright red blood per rectum. No vision change or hearing change, infection or cough.  PAST MEDICAL HISTORY:  1. Atrial fibrillation. 2. CHF 3. Depression. 4. Gout.  5. Anemia.  6. Hypertension. 7. Trauma to the toe.   PAST SURGICAL HISTORY:  1. Intestinal blockage. 2. Left shoulder surgery. 3. Low back pain. 4. Right and left  total knee replacement.   ALLERGIES: She states she codeine and sulfa drugs.   MEDICATIONS: She is on: 1. Coreg 25 mg twice a day.  2. Colchicine 0.6 mg once a day.  3. Diltiazem 120 mg q.12 hours extended release.   4. Lasix 20 mg a day.  5. Hydralazine 25 mg tablet twice a day.  6. Hydralazine/valsartan 12.5/80 mg once a day.  7. Lovastatin 80 mg a day. 8. Mirtazapine 30 mg at bedtime.  9. Montelukast 10 mg in the morning.  10. Omeprazole 40 mg a day.  11. Potassium chloride 20 mEq extended release. 12. Warfarin usual dose, she alternates 3 mg and 4 mg a day.   PHYSICAL EXAMINATION:  VITAL SIGNS: Blood pressure 140/70, heart rate of 45 and  atrial fibrillation and irregular,  respiratory 14, afebrile.   HEENT: Normocephalic, atraumatic. Pupils equal, reactive to light.   NECK: Supple. No significant jugular venous distention, bruits or adenopathy.   LUNGS: Clear to auscultation and percussion. No significant wheeze, rhonchi, or rales.   HEART: Irregularly irregular, bradycardic. Systolic ejection murmur left sternal border. PMI nondisplaced.   ABDOMEN: Benign.   EXTREMITIES: Within normal limits except for recent surgery.  NEUROLOGIC: Intact.   SKIN: Normal.   LABORATORIES: Recent laboratory shows a glucose of 107, BUN is 64, creatinine is 2.43, sodium of 142, potassium is 2.8, chloride of 109, CO2 of 24. EKG: Atrial fibrillation, bradycardic, rate of 45, nonspecific findings.   ASSESSMENT:  1. Atrial fibrillation and bradycardia. 2. Renal insufficiency.  3. Hypertension.  4. Hyperlipidemia.  5. Reflux. 6. Gout.   7. History of congestive heart failure. 8. Depression. 9. History of anemia.   PLAN: After evaluating the patient,  she does have atrial fibrillation with significant bradycardia. I do not believe the patient meets criteria for permanent pacemaker. I am concerned that medication is contributing to her extreme bradycardia.  Would consider reducing the dose  of Coreg and hydralazine and controlling her blood pressure either with increasing dose of valsartan or hydralazine.  Patient may end up needing a pacemaker in the future but I do not think it is necessary at this point if we can reduce her medication increase her heart rate and hopefully treat the patient medically.  For now have the patient follow up with her medical doctor and cardiologist for further medication adjustment.  Echocardiogram may be helpful at some point but once medications are reduced for her heart rate she should do reasonably well.     MEDICATIONS:  Contributing to her extreme bradycardia. Would consider reducing the dose of chloride hydralazine and control her blood pressure wall. We will increase the dose of valsartan or hydralazine patient meeting him in a pacemaker in the future, but I do not think is necessary at this point and if we can reduce her medication to reduce her heart rate and hopefully transfer to             treat the patient medically for now. Have the patient follow-up with cardiologist for further medication adjustment. Echocardiogram may be helpful at some point but once the medications are reduce for her heart rate she should do reasonably well.     ____________________________ Loran Senters. Clayborn Bigness, MD ddc:vtd D: 10/12/2011 14:14:14 ET T: 10/13/2011 08:46:22 ET JOB#: [id number]  cc: Shalayna Ornstein D. Clayborn Bigness, MD, <Dictator> Yolonda Kida MD ELECTRONICALLY SIGNED 11/13/2011 15:15

## 2014-05-25 NOTE — Op Note (Signed)
PATIENT NAME:  [name] [name], [name] [name] MR#:  [id number] DATE OF BIRTH:  [birth year]  DATE OF PROCEDURE:  10/12/2011  PREOPERATIVE DIAGNOSES:  1. Hallux abductovalgus deformity left foot with degenerative changes to the joint.  2. Hammertoe deformity second toe left foot.  3. Hammertoe deformity third toe left foot.   POSTOPERATIVE DIAGNOSES:  1. Hallux abductovalgus deformity left foot with degenerative changes to the joint.  2. Hammertoe deformity second toe left foot.  3. Hammertoe deformity third toe left foot.  4. Tophaceous gout in the first MTP joint.   PROCEDURE: 1. <<MKellerISSING TEXT>> hallux abductovalgus correction with K-wire fixation and excision and removal of tophaceous gout deposits.  2. Hammertoe deformity correction second toe left foot.  3. Hammertoe correction third toe left foot.   SURGEON: Perry Mount, DPM.   ASSISTANT: None.   HISTORY OF PRESENT ILLNESS: The patient has had pain and discomfort in her left foot for long many years and has had continued problems with it and desires surgical correction since conservative treatments have proven ineffective.   ANESTHESIA: Monitored anesthesia care with local. CRNA Pearson Grippe.   ESTIMATED BLOOD LOSS: Approximately 20 mL.   OPERATIVE REPORT: The patient was brought to the operating room and placed on the operating room table in the supine position. At this point, after sedation was achieved I blocked her foot with local anesthetic. The patient was then prepped and draped in the usual sterile manner. Attention was then directed to the dorsum of the right foot. Tourniquet was inflated but was insufficient because she has some vessel calcification and it did not really compress her arteries well enough to be effective so we just opted to not use a tourniquet and do a wet field. Attention was directed to the dorsum of the first MTPJ where a 4-cm dorsal linear skin incision was made and deepened with sharp and blunt  dissection. Bleeders were clamped and bovied as required. The capsular tissue was incised longitudinally. There were tophaceous gout deposits encountered. These were removed and cleaned out as best we could and sent for crystal analysis as well as pathologic analysis. The base of the proximal phalanx was identified as the metatarsal head was as well. Medial resection of the metatarsal head was accomplished and then the base of the proximal phalanx was resected as well. Drill holes were placed in the plantar aspects of the proximal phalanx residual base to allow for a suture to be placed across the area. This was then sutured through the flexor tendons and sutured to the proximal phalanx base with 2-0 Vicryl simple interrupted sutures. At this point, a K-wire was drilled through the proximal and distal phalanx and then retrograded into the metatarsal head and shaft. The capsular tissue was then picked up and sutured into the space of the pseudojoint area with a figure-of-eight type of suture, closing the capsular tissue within the joint space. The area was then copiously irrigated and the remaining capsular tissue was closed over the metatarsal head with 4-0 Vicryl continuous stitch. Deep and superficial fascia layers were closed with 4-0 Vicryl in a continuous stitch as well. The skin was closed with subcuticular stitch and attention was then directed to the second toe.    At this time, attention was directed to the second digit of the left foot. Two semi-elliptical incisions were made over the PIP joint and a 1-cm linear incision was made over the first MTP joint. The ellipse of skin over the PIP joint was removed and  the proximal incision was then deepened with sharp and blunt dissection. The extensor tendon was identified at the MTPJ and the extensor tendon was identified and extensor hood release was performed and then an extensor tendon lengthening was performed with a Z-plasty technique. This allowed the toe  to relax some and sit in a better position. After irrigation here, attention was directed to the distal incision where the extensor tendon was identified, incised transversely, and reflected proximally. The head of the proximal phalanx was resected. I did not use any K wires on her.  I was concerned about her overall circulation especially with some of these calcified vessels. The area was then copiously irrigated and the extensor tendon was then reapproximated with 4-0 Vicryl simple sutures at the PIP joint. The skin was closed with 5-0 nylon horizontal mattress sutures. Proximally the incision was closed. The deep and superficial fascia was closed with 4-0 Vicryl in a continuous stitch. The skin was closed with 4-0 Vicryl in subcuticular fashion.   At this time attention was directed to the left foot where an identical procedure was performed.   At this time all areas were blocked with 0.5% Marcaine plain. Sterile compressive dressing was placed across the wound consisting of Steri-Strips, Xeroform gauze, 4 x 4's, Kling, and Kerlix. No tourniquet was used. A posterior splint was placed on the left foot and leg in the operating room.   The K-wire in the great toe was bent, cut, and capped.  The patient left the operating room for the recovery room with vital signs stable and neurovascular status intact.     ____________________________ Gerrit Heck. Teagan Ozawa, DPM mgt:bjt D: 10/12/2011 10:13:42 ET T: 10/12/2011 16:27:07 ET JOB#: [id number]  cc: Gerrit Heck Irbin Fines, DPM, <Dictator> Perry Mount MD ELECTRONICALLY SIGNED 10/23/2011 12:48

## 2014-05-25 NOTE — Consult Note (Signed)
PATIENT NAME:  [name] [name], HICKEL MR#:  [id number] DATE OF BIRTH:  1930-10-10  DATE OF CONSULTATION:  10/12/2011  REFERRING PHYSICIAN:   CONSULTING PHYSICIAN:  Demetrios Loll, MD  ADDENDUM:  ALLERGIES: Patient has allergies to codeine, sulfa drugs.    MEDICATIONS:  1. Coreg 25 mg p.o. b.i.d. 2. Colcrys 0.6 mg p.o. once daily. 3. Diltiazem 120 mg extended-release 1 capsule every 12 hours.  4. Lasix 20 mg p.o. daily.  5. Hydralazine 25 mg p.o. b.i.d.  6. Hydrochlorothiazide/losartan 12.5 mg/80 mg 1 tablet p.o. daily.  7. Lovastatin 40 mg p.o. at bedtime.  8. Mirtazapine 30 mg p.o. at bedtime.  9. Singulair 10 mg p.o. in the morning.  10. Omeprazole 40 mg p.o. in the morning. 11. Potassium 20 mEq p.o. in the morning.  12. Warfarin 1 mg p.o. once a day at bedtime, 3 mg tablets for total 4 mg alternate with 3 mg tablets every other day.  RECOMMENDATIONS. Since the patient's blood pressure is in lower side I recommended to hold the Lasix, hydralazine, hydrochlorothiazide, and the losartan at this time and monitor vital signs closely.   Thank you for the consult.    ____________________________ Demetrios Loll, MD qc:vtd D: 10/12/2011 11:37:23 ET T: 10/12/2011 12:29:57 ET JOB#: [id number] cc: Demetrios Loll, MD, <Dictator> Demetrios Loll MD ELECTRONICALLY SIGNED 10/13/2011 13:34

## 2014-05-25 NOTE — Consult Note (Signed)
PATIENT NAME:  [name] [name], LINKE MR#:  [id number] DATE OF BIRTH:  06-25-1930  DATE OF CONSULTATION:  10/12/2011  REFERRING PHYSICIAN:  Dr  Elvina Mattes CONSULTING PHYSICIAN:  Demetrios Loll, MD PRIMARY CARE PHYSICIAN: None local.  REASON FOR CONSULTATION: Bradycardia today.   REVIEW OF HISTORY: The patient is a 81 year old female with a history of hypertension, congestive heart failure, atrial fibrillation, anemia, gout, who got left foot surgery today.  According to nurse, the patient took Coreg and Cardizem for atrial fibrillation this morning before surgery. Patient's heart rate decreased to 38 during operation and then up to 40s after surgery. Patient denies any chest pain, palpitation, orthopnea, or nocturnal dyspnea. No headache or dizziness. No abdominal pain, nausea, vomiting, or diarrhea.   PAST MEDICAL HISTORY: Atrial fibrillation, CHF, hypertension, anemia, gout.    FAMILY HISTORY: No.  SOCIAL HISTORY: No smoking, alcohol drinking, or illicit drugs.   REVIEW OF SYSTEMS. CONSTITUTIONAL: No fever, chills. No headache or dizziness. No weakness. CARDIOVASCULAR: No chest pain, palpitation. No orthopnea or nocturnal dyspnea. EYES: No double vision or blurred vision. ENT: No epistaxis, postnasal drip, slurred speech, or dysphagia. PULMONARY: No cough, sputum, shortness of breath, or hematemesis. GASTROINTESTINAL: No abdominal pain, nausea, vomiting, or diarrhea. No melena or bloody stool. GU: No dysuria, hematuria, or incontinence. NEUROLOGY: No syncope, loss of consciousness, or seizure.   PHYSICAL EXAMINATION:  VITAL SIGNS: Blood pressure of 100/55, pulse 45, respirations 18, O2 saturation invasion 91% in room air.   GENERAL: The patient is alert, awake, oriented, in no acute distress.   HEENT: Pupils round, equal, react to light and accommodation. Moist oral mucosa. Clear oropharynx.   NECK: Supple. No JVD or carotid bruit. No lymphadenopathy. No thyromegaly.   CARDIOVASCULAR: S1, S2,  irregular rate and rhythm, bradycardia. No murmurs or gallops.     PULMONARY: Bilateral air entry. No wheezing or rales.   ABDOMEN: Soft. No distention or tenderness. No organomegaly. Bowel sounds present.   EXTREMITIES: No edema, clubbing, or cyanosis. No calf tenderness. Left leg in dressing.   SKIN: No rash or jaundice.   NEUROLOGY: Alert and oriented x3. No focal deficits.   LABORATORY DATA: No labs today. Potassium was 3.9 on 10/02/2011. Hemoglobin was 11.2 on 10/02/2011.   IMPRESSION:  1. Bradycardia, possibly due to beta blocker and Cardizem and also related to sedation during operation.  2. Atrial fibrillation, rate controlled.  3. Congestive heart failure, stable.  4. Hypertension, controlled.  5. Anemia.  RECOMMENDATIONS: Continue telemonitor.  We will hold the Coreg and the Cardizem.  We will get a BMP, and patient will need Cardiology for clearance of discharge to home. Thank you for the consult. Discussed the patient's situation and recommendations with the patient and daughter.    TIME SPENT: About 50 minutes.    ____________________________ Demetrios Loll, MD qc:vtd D: 10/12/2011 11:34:33 ET   T: 10/12/2011 13:01:06 ET   JOB#: [id number] cc: Demetrios Loll, MD, <Dictator> Demetrios Loll MD ELECTRONICALLY SIGNED 10/13/2011 13:57

## 2015-03-10 ENCOUNTER — Encounter (HOSPITAL_COMMUNITY): Payer: Self-pay | Admitting: *Deleted

## 2015-03-10 ENCOUNTER — Emergency Department (HOSPITAL_COMMUNITY)
Admission: EM | Admit: 2015-03-10 | Discharge: 2015-03-10 | Disposition: A | Discharge status: Home / Self Care | Payer: Medicare Other | Attending: Emergency Medicine | Admitting: Emergency Medicine

## 2015-03-10 DIAGNOSIS — K219 Gastro-esophageal reflux disease without esophagitis: Secondary | ICD-10-CM | POA: Insufficient documentation

## 2015-03-10 DIAGNOSIS — Z9119 Patient's noncompliance with other medical treatment and regimen: Secondary | ICD-10-CM | POA: Diagnosis not present

## 2015-03-10 DIAGNOSIS — I129 Hypertensive chronic kidney disease with stage 1 through stage 4 chronic kidney disease, or unspecified chronic kidney disease: Secondary | ICD-10-CM | POA: Diagnosis present

## 2015-03-10 DIAGNOSIS — F419 Anxiety disorder, unspecified: Secondary | ICD-10-CM | POA: Diagnosis not present

## 2015-03-10 DIAGNOSIS — I5030 Unspecified diastolic (congestive) heart failure: Secondary | ICD-10-CM | POA: Diagnosis not present

## 2015-03-10 DIAGNOSIS — Z7901 Long term (current) use of anticoagulants: Secondary | ICD-10-CM | POA: Insufficient documentation

## 2015-03-10 DIAGNOSIS — Z7951 Long term (current) use of inhaled steroids: Secondary | ICD-10-CM | POA: Insufficient documentation

## 2015-03-10 DIAGNOSIS — M109 Gout, unspecified: Secondary | ICD-10-CM | POA: Insufficient documentation

## 2015-03-10 DIAGNOSIS — I4891 Unspecified atrial fibrillation: Secondary | ICD-10-CM | POA: Insufficient documentation

## 2015-03-10 DIAGNOSIS — E7800 Pure hypercholesterolemia, unspecified: Secondary | ICD-10-CM | POA: Diagnosis not present

## 2015-03-10 DIAGNOSIS — N183 Chronic kidney disease, stage 3 (moderate): Secondary | ICD-10-CM | POA: Diagnosis not present

## 2015-03-10 DIAGNOSIS — Z79899 Other long term (current) drug therapy: Secondary | ICD-10-CM | POA: Diagnosis not present

## 2015-03-10 DIAGNOSIS — I10 Essential (primary) hypertension: Secondary | ICD-10-CM

## 2015-03-10 NOTE — ED Provider Notes (Signed)
CSN: [id number]     Arrival date & time 03/10/15  1938 History  By signing my name below, I, Eustaquio Maize, attest that this documentation has been prepared under the direction and in the presence of Noemi Chapel, MD. Electronically Signed: Eustaquio Maize, ED Scribe. 03/10/2015. 9:56 PM.   Chief Complaint  Patient presents with  . Hypertension   The history is provided by the patient. No language interpreter was used.     HPI Comments: [name] [name] is a 82 y.o. female with hx atrial fibrillation, HTN, HLD, CHF, and CKD  who presents to the Emergency Department complaining of hypertension that began tonight. Pt was seen at [facility] tonight for dizziness for the past month when she was told her BP was elevated. Pt cannot say what her BP normally runs but states the last time she was at her PCP's they told her it was "good." Pt's BP in the ED is 160/82. Pt does admit that she has had increased stress in her life lately worrying about her granddaughter that is going away to college. Denies vomiting, abdominal pain, chest pain, shortness of breath, numbness, weakness, or any other associated symptoms.    Past Medical History  Diagnosis Date  . Atrial fibrillation ([place])   . Hypertension   . Hypercholesterolemia   . Seasonal allergies   . Acid reflux   . CHF (congestive heart failure) ([facility])     Hx of diastolic CHF.  Marland Kitchen Gout   . Diverticular hemorrhage 2011    Per colonoscopy  . Thrombocytopenia due to drugs     Allopurinol  . CKD (chronic kidney disease) stage 3, GFR 30-59 ml/min   . Hyperglycemia     Query DM 2  . Chronic anticoagulation   . Diverticulosis 2011    Per colonoscopy  . Internal hemorrhoids 2011    Per colonoscopy  . GI bleeding 08/03/2011    Not scoped but likely secondary to hemorrhoids or diverticulosis  . ARF (acute renal failure) ([place]) 09/16/2010    Prerenal. Creatinine 1.36 before discharge.  Marland Kitchen Hx of medication noncompliance    Past Surgical History   Procedure Laterality Date  . Cesarean section    . Cataract extraction    . Joint replacement      Total right knee 2007  . Appendectomy    . Tubal ligation    . Laparotomy  09/22/2010    Procedure: EXPLORATORY LAPAROTOMY;  Surgeon: [name];  Location: [facility];  Service: General;  Laterality: N/A;  Lysis of Adhesions  . Esophagogastroduodenoscopy  08/20/09    small hiatal hernia/mild gastritis  . Ileocolonoscopy  08/20/09    normal terminal ileum/pancolonic diverticula/no polyps/benign colon mucosa   History reviewed. No pertinent family history. Social History  Substance Use Topics  . Smoking status: Never Smoker   . Smokeless tobacco: None  . Alcohol Use: No   OB History    No data available     Review of Systems  Respiratory: Negative for shortness of breath.   Cardiovascular: Negative for chest pain.  Gastrointestinal: Negative for vomiting and abdominal pain.  Neurological: Negative for weakness and numbness.  All other systems reviewed and are negative.     Allergies  Codeine and Sulfa antibiotics  Home Medications   Prior to Admission medications   Medication Sig Start Date End Date Taking? Authorizing Provider  allopurinol (ZYLOPRIM) 100 MG tablet Take 100 mg by mouth daily.   Yes Historical Provider, MD  apixaban Arne Cleveland)  5 MG TABS tablet Take 5 mg by mouth 2 (two) times daily.   Yes Historical Provider, MD  carvedilol (COREG) 12.5 MG tablet Take 12.5 mg by mouth 2 (two) times daily with a meal.   Yes Historical Provider, MD  diltiazem (DILTIAZEM CD) 120 MG 24 hr capsule Take 120 mg by mouth daily.   Yes Historical Provider, MD  ferrous sulfate 325 (65 FE) MG tablet Take 325 mg by mouth 2 (two) times daily with a meal.   Yes Historical Provider, MD  fluticasone (FLONASE) 50 MCG/ACT nasal spray Place 1 spray into both nostrils daily.   Yes Historical Provider, MD  hydrALAZINE (APRESOLINE) 25 MG tablet Take 25 mg by mouth 2 (two) times daily.   Yes  Historical Provider, MD  mirtazapine (REMERON) 30 MG tablet Take 30 mg by mouth at bedtime.     Yes Historical Provider, MD  montelukast (SINGULAIR) 10 MG tablet Take 10 mg by mouth every morning.     Yes Historical Provider, MD  Multiple Vitamin (MULTIVITAMIN WITH MINERALS) TABS Take 1 tablet by mouth daily.   Yes Historical Provider, MD  omeprazole (PRILOSEC) 20 MG capsule Take 1 capsule (20 mg total) by mouth daily. For acid reflux. 04/14/14 04/14/15 Yes Kathie Dike, MD  polyethylene glycol (MIRALAX / GLYCOLAX) packet Take 17 g by mouth daily.   Yes Historical Provider, MD  potassium chloride SA (K-DUR,KLOR-CON) 20 MEQ tablet Take 20 mEq by mouth daily.   Yes Historical Provider, MD  pravastatin (PRAVACHOL) 40 MG tablet Take 40 mg by mouth daily.   Yes Historical Provider, MD  Calcium Carbonate-Vitamin D (CALCIUM 500 + D PO) Take 1 tablet by mouth daily.    Historical Provider, MD  colchicine 0.6 MG tablet Take 1 tablet (0.6 mg total) by mouth daily as needed (Gout). Patient not taking: Reported on 03/10/2015 08/05/11   Rexene Alberts, MD  furosemide (LASIX) 20 MG tablet Decrease the dose to one tablet once daily as needed for swelling. Patient taking differently: Take 20 mg by mouth every other day. Decrease the dose to one tablet once daily as needed for swelling. 08/05/11   Rexene Alberts, MD   BP 149/93 mmHg  Pulse 74  Temp(Src) 97.3 F (36.3 C) (Oral)  Resp 17  Ht 5\' 5"  (1.651 m)  Wt 150 lb (68.04 kg)  BMI 24.96 kg/m2  SpO2 97%   Physical Exam  Constitutional: She appears well-developed and well-nourished. No distress.  Appears mildly anxious  HENT:  Head: Normocephalic and atraumatic.  Mouth/Throat: Oropharynx is clear and moist. No oropharyngeal exudate.  Eyes: Conjunctivae and EOM are normal. Pupils are equal, round, and reactive to light. Right eye exhibits no discharge. Left eye exhibits no discharge. No scleral icterus.  Neck: Normal range of motion. Neck supple. No JVD present.  No thyromegaly present.  Cardiovascular: Normal rate, regular rhythm, normal heart sounds and intact distal pulses.  Exam reveals no gallop and no friction rub.   No murmur heard. Pulmonary/Chest: Effort normal and breath sounds normal. No respiratory distress. She has no wheezes. She has no rales.  Abdominal: Soft. Bowel sounds are normal. She exhibits no distension and no mass. There is no tenderness.  Musculoskeletal: Normal range of motion. She exhibits no edema or tenderness.  Lymphadenopathy:    She has no cervical adenopathy.  Neurological: She is alert. Coordination normal.  Neurologic exam:  Speech clear, pupils equal round reactive to light, extraocular movements intact  Normal peripheral visual fields Cranial nerves III  through XII normal including no facial droop Follows commands, moves all extremities x4, normal strength to bilateral upper and lower extremities at all major muscle groups including grip Sensation normal to light touch and pinprick Coordination intact, no limb ataxia, finger-nose-finger normal Rapid alternating movements normal No pronator drift Gait normal   Skin: Skin is warm and dry. No rash noted. No erythema.  Psychiatric: She has a normal mood and affect. Her behavior is normal.  Nursing note and vitals reviewed.   ED Course  Procedures (including critical care time)  DIAGNOSTIC STUDIES: Oxygen Saturation is 100% on RA, normal by my interpretation.    COORDINATION OF CARE: 9:51 PM-Discussed treatment plan with pt at bedside and pt agreed to plan.   Labs Review Labs Reviewed - No data to display  Imaging Review No results found.   EKG Interpretation   Date/Time:  Thursday March 10 2015 22:23:02 EST Ventricular Rate:  88 PR Interval:    QRS Duration: 95 QT Interval:  367 QTC Calculation: 444 R Axis:   96 Text Interpretation:  Atrial fibrillation Right axis deviation since last  tracing no significant change Confirmed by Kashlynn Kundert   MD, Neita Landrigan ([id number]) on  03/10/2015 10:42:39 PM      MDM   Final diagnoses:  Essential hypertension    Well appearing, no signs of neuro dysfunction No cardiac dysfunction - BP improved wtihout intervention No need to change meds tonight Pt is asymptomatic. I personally performed the services described in this documentation, which was scribed in my presence. The recorded information has been reviewed and is accurate.         Noemi Chapel, MD 03/10/15 [phone]

## 2015-03-10 NOTE — ED Notes (Signed)
Pt reporting elevated BP. Denies any pain at this time, but reports feeling "nervous like" and occasionally dizzy.

## 2015-03-10 NOTE — Discharge Instructions (Signed)
Your blood pressure has come down gradually by itself Take your blood pressure daily and share the numbers with your doctor in next 2 weeks   Hypertension Hypertension, commonly called high blood pressure, is when the force of blood pumping through your arteries is too strong. Your arteries are the blood vessels that carry blood from your heart throughout your body. A blood pressure reading consists of a higher number over a lower number, such as 110/72. The higher number (systolic) is the pressure inside your arteries when your heart pumps. The lower number (diastolic) is the pressure inside your arteries when your heart relaxes. Ideally you want your blood pressure below 120/80. Hypertension forces your heart to work harder to pump blood. Your arteries may become narrow or stiff. Having untreated or uncontrolled hypertension can cause heart attack, stroke, kidney disease, and other problems. RISK FACTORS Some risk factors for high blood pressure are controllable. Others are not.  Risk factors you cannot control include:   Race. You may be at higher risk if you are African American.  Age. Risk increases with age.  Gender. Men are at higher risk than women before age 4 years. After age 51, women are at higher risk than men. Risk factors you can control include:  Not getting enough exercise or physical activity.  Being overweight.  Getting too much fat, sugar, calories, or salt in your diet.  Drinking too much alcohol. SIGNS AND SYMPTOMS Hypertension does not usually cause signs or symptoms. Extremely high blood pressure (hypertensive crisis) may cause headache, anxiety, shortness of breath, and nosebleed. DIAGNOSIS To check if you have hypertension, your health care provider will measure your blood pressure while you are seated, with your arm held at the level of your heart. It should be measured at least twice using the same arm. Certain conditions can cause a difference in blood  pressure between your right and left arms. A blood pressure reading that is higher than normal on one occasion does not mean that you need treatment. If it is not clear whether you have high blood pressure, you may be asked to return on a different day to have your blood pressure checked again. Or, you may be asked to monitor your blood pressure at home for 1 or more weeks. TREATMENT Treating high blood pressure includes making lifestyle changes and possibly taking medicine. Living a healthy lifestyle can help lower high blood pressure. You may need to change some of your habits. Lifestyle changes may include:  Following the DASH diet. This diet is high in fruits, vegetables, and whole grains. It is low in salt, red meat, and added sugars.  Keep your sodium intake below 2,300 mg per day.  Getting at least 30-45 minutes of aerobic exercise at least 4 times per week.  Losing weight if necessary.  Not smoking.  Limiting alcoholic beverages.  Learning ways to reduce stress. Your health care provider may prescribe medicine if lifestyle changes are not enough to get your blood pressure under control, and if one of the following is true:  You are 3-90 years of age and your systolic blood pressure is above 140.  You are 45 years of age or older, and your systolic blood pressure is above 150.  Your diastolic blood pressure is above 90.  You have diabetes, and your systolic blood pressure is over [id number] or your diastolic blood pressure is over 90.  You have kidney disease and your blood pressure is above 140/90.  You have heart disease  and your blood pressure is above 140/90. Your personal target blood pressure may vary depending on your medical conditions, your age, and other factors. HOME CARE INSTRUCTIONS  Have your blood pressure rechecked as directed by your health care provider.   Take medicines only as directed by your health care provider. Follow the directions carefully. Blood  pressure medicines must be taken as prescribed. The medicine does not work as well when you skip doses. Skipping doses also puts you at risk for problems.  Do not smoke.   Monitor your blood pressure at home as directed by your health care provider. SEEK MEDICAL CARE IF:   You think you are having a reaction to medicines taken.  You have recurrent headaches or feel dizzy.  You have swelling in your ankles.  You have trouble with your vision. SEEK IMMEDIATE MEDICAL CARE IF:  You develop a severe headache or confusion.  You have unusual weakness, numbness, or feel faint.  You have severe chest or abdominal pain.  You vomit repeatedly.  You have trouble breathing. MAKE SURE YOU:   Understand these instructions.  Will watch your condition.  Will get help right away if you are not doing well or get worse.   This information is not intended to replace advice given to you by your health care provider. Make sure you discuss any questions you have with your health care provider.   Document Released: 01/22/2005 Document Revised: 06/08/2014 Document Reviewed: 11/14/2012 Elsevier Interactive Patient Education [employer].

## 2015-03-10 NOTE — ED Notes (Signed)
Pt states dizziness for the past month, worse tonight. Went to [employer] & BP was high.

## 2015-04-02 ENCOUNTER — Emergency Department (HOSPITAL_COMMUNITY)
Admission: EM | Admit: 2015-04-02 | Discharge: 2015-04-02 | Disposition: A | Discharge status: Home / Self Care | Payer: Medicare Other | Attending: Emergency Medicine | Admitting: Emergency Medicine

## 2015-04-02 ENCOUNTER — Emergency Department (HOSPITAL_COMMUNITY): Payer: Medicare Other

## 2015-04-02 ENCOUNTER — Encounter (HOSPITAL_COMMUNITY): Payer: Self-pay

## 2015-04-02 DIAGNOSIS — Z8719 Personal history of other diseases of the digestive system: Secondary | ICD-10-CM | POA: Diagnosis not present

## 2015-04-02 DIAGNOSIS — E876 Hypokalemia: Secondary | ICD-10-CM | POA: Insufficient documentation

## 2015-04-02 DIAGNOSIS — R42 Dizziness and giddiness: Secondary | ICD-10-CM | POA: Diagnosis present

## 2015-04-02 DIAGNOSIS — I5030 Unspecified diastolic (congestive) heart failure: Secondary | ICD-10-CM | POA: Diagnosis not present

## 2015-04-02 DIAGNOSIS — I130 Hypertensive heart and chronic kidney disease with heart failure and stage 1 through stage 4 chronic kidney disease, or unspecified chronic kidney disease: Secondary | ICD-10-CM | POA: Insufficient documentation

## 2015-04-02 DIAGNOSIS — N183 Chronic kidney disease, stage 3 (moderate): Secondary | ICD-10-CM | POA: Insufficient documentation

## 2015-04-02 DIAGNOSIS — I4891 Unspecified atrial fibrillation: Secondary | ICD-10-CM | POA: Diagnosis not present

## 2015-04-02 DIAGNOSIS — Z79899 Other long term (current) drug therapy: Secondary | ICD-10-CM | POA: Insufficient documentation

## 2015-04-02 DIAGNOSIS — Z9049 Acquired absence of other specified parts of digestive tract: Secondary | ICD-10-CM | POA: Insufficient documentation

## 2015-04-02 DIAGNOSIS — Z8739 Personal history of other diseases of the musculoskeletal system and connective tissue: Secondary | ICD-10-CM | POA: Insufficient documentation

## 2015-04-02 LAB — CBC WITH DIFFERENTIAL/PLATELET
Basophils Absolute: 0 10*3/uL (ref 0.0–0.1)
Basophils Relative: 0 %
Eosinophils Absolute: 0.2 10*3/uL (ref 0.0–0.7)
Eosinophils Relative: 5 %
HCT: 35.6 % — ABNORMAL LOW (ref 36.0–46.0)
Hemoglobin: 11.6 g/dL — ABNORMAL LOW (ref 12.0–15.0)
Lymphocytes Relative: 31 %
Lymphs Abs: 1.5 10*3/uL (ref 0.7–4.0)
MCH: 26.7 pg (ref 26.0–34.0)
MCHC: 32.6 g/dL (ref 30.0–36.0)
MCV: 82 fL (ref 78.0–100.0)
Monocytes Absolute: 0.6 10*3/uL (ref 0.1–1.0)
Monocytes Relative: 12 %
Neutro Abs: 2.6 10*3/uL (ref 1.7–7.7)
Neutrophils Relative %: 53 %
Platelets: 216 10*3/uL (ref 150–400)
RBC: 4.34 MIL/uL (ref 3.87–5.11)
RDW: 17.7 % — ABNORMAL HIGH (ref 11.5–15.5)
WBC: 4.9 10*3/uL (ref 4.0–10.5)

## 2015-04-02 LAB — COMPREHENSIVE METABOLIC PANEL
ALT: 14 U/L (ref 14–54)
AST: 18 U/L (ref 15–41)
Albumin: 3.6 g/dL (ref 3.5–5.0)
Alkaline Phosphatase: 68 U/L (ref 38–126)
Anion gap: 13 (ref 5–15)
BUN: 19 mg/dL (ref 6–20)
CO2: 23 mmol/L (ref 22–32)
Calcium: 8.3 mg/dL — ABNORMAL LOW (ref 8.9–10.3)
Chloride: 107 mmol/L (ref 101–111)
Creatinine, Ser: 1.2 mg/dL — ABNORMAL HIGH (ref 0.44–1.00)
GFR calc Af Amer: 47 mL/min — ABNORMAL LOW (ref >60)
GFR calc non Af Amer: 41 mL/min — ABNORMAL LOW (ref >60)
Glucose, Bld: 105 mg/dL — ABNORMAL HIGH (ref 65–99)
Potassium: 2.7 mmol/L — CL (ref 3.5–5.1)
Sodium: 143 mmol/L (ref 135–145)
Total Bilirubin: 0.9 mg/dL (ref 0.3–1.2)
Total Protein: 7.2 g/dL (ref 6.5–8.1)

## 2015-04-02 LAB — TROPONIN I: Troponin I: <0.03 ng/mL (ref <0.031)

## 2015-04-02 MED ORDER — POTASSIUM CHLORIDE CRYS ER 20 MEQ PO TBCR
60.0000 meq | EXTENDED_RELEASE_TABLET | Freq: Once | ORAL | Status: AC
  Administered 2015-04-02: 60 meq via ORAL
  Filled 2015-04-02: qty 3

## 2015-04-02 NOTE — ED Notes (Signed)
MD at bedside. 

## 2015-04-02 NOTE — ED Provider Notes (Signed)
CSN: [id number]     Arrival date & time 04/02/15  1723 History   First MD Initiated Contact with Patient 04/02/15 1733     Chief Complaint  Patient presents with  . Dizziness     (Consider location/radiation/quality/duration/timing/severity/associated sxs/prior Treatment) HPI Patient presents from home with concerns for lightheadedness. Patient specifies that she does not feel instability, nor dizziness, but does feel lightheaded, particularly with upright positioning, walking. Patient denies focal pain, states that her lightheadedness has been present for some time. Onset is unclear, symptoms seem worse with upright positioning, no other clear exacerbating or alleviating factors. Patient acknowledges multiple medical issues, states that she also has not been taking medication regularly.  Past Medical History  Diagnosis Date  . Atrial fibrillation ([place])   . Hypertension   . Hypercholesterolemia   . Seasonal allergies   . Acid reflux   . CHF (congestive heart failure) (HCC)     Hx of diastolic CHF.  Marland Kitchen Gout   . Diverticular hemorrhage 2011    Per colonoscopy  . Thrombocytopenia due to drugs     Allopurinol  . CKD (chronic kidney disease) stage 3, GFR 30-59 ml/min   . Hyperglycemia     Query DM 2  . Chronic anticoagulation   . Diverticulosis 2011    Per colonoscopy  . Internal hemorrhoids 2011    Per colonoscopy  . GI bleeding 08/03/2011    Not scoped but likely secondary to hemorrhoids or diverticulosis  . ARF (acute renal failure) ([place]) 09/16/2010    Prerenal. Creatinine 1.36 before discharge.  Marland Kitchen Hx of medication noncompliance    Past Surgical History  Procedure Laterality Date  . Cesarean section    . Cataract extraction    . Joint replacement      Total right knee 2007  . Appendectomy    . Tubal ligation    . Laparotomy  09/22/2010    Procedure: EXPLORATORY LAPAROTOMY;  Surgeon: [name];  Location: [facility];  Service: General;  Laterality: N/A;  Lysis of  Adhesions  . Esophagogastroduodenoscopy  08/20/09    small hiatal hernia/mild gastritis  . Ileocolonoscopy  08/20/09    normal terminal ileum/pancolonic diverticula/no polyps/benign colon mucosa   No family history on file. Social History  Substance Use Topics  . Smoking status: Never Smoker   . Smokeless tobacco: None  . Alcohol Use: No   OB History    No data available     Review of Systems  Constitutional:       Per HPI, otherwise negative  HENT:       Per HPI, otherwise negative  Respiratory:       Per HPI, otherwise negative  Cardiovascular:       Per HPI, otherwise negative  Gastrointestinal: Negative for vomiting.  Endocrine:       Negative aside from HPI  Genitourinary:       Neg aside from HPI   Musculoskeletal:       Per HPI, otherwise negative  Skin: Negative.   Neurological: Positive for weakness and light-headedness. Negative for syncope and headaches.      Allergies  Codeine and Sulfa antibiotics  Home Medications   Prior to Admission medications   Medication Sig Start Date End Date Taking? Authorizing Provider  allopurinol (ZYLOPRIM) 100 MG tablet Take 100 mg by mouth daily.    Historical Provider, MD  apixaban (ELIQUIS) 5 MG TABS tablet Take 5 mg by mouth 2 (two) times daily.    Historical  Provider, MD  Calcium Carbonate-Vitamin D (CALCIUM 500 + D PO) Take 1 tablet by mouth daily.    Historical Provider, MD  carvedilol (COREG) 12.5 MG tablet Take 12.5 mg by mouth 2 (two) times daily with a meal.    Historical Provider, MD  colchicine 0.6 MG tablet Take 1 tablet (0.6 mg total) by mouth daily as needed (Gout). Patient not taking: Reported on 03/10/2015 08/05/11   Rexene Alberts, MD  diltiazem (DILTIAZEM CD) 120 MG 24 hr capsule Take 120 mg by mouth daily.    Historical Provider, MD  ferrous sulfate 325 (65 FE) MG tablet Take 325 mg by mouth 2 (two) times daily with a meal.    Historical Provider, MD  fluticasone (FLONASE) 50 MCG/ACT nasal spray Place 1  spray into both nostrils daily.    Historical Provider, MD  furosemide (LASIX) 20 MG tablet Decrease the dose to one tablet once daily as needed for swelling. Patient taking differently: Take 20 mg by mouth every other day. Decrease the dose to one tablet once daily as needed for swelling. 08/05/11   Rexene Alberts, MD  hydrALAZINE (APRESOLINE) 25 MG tablet Take 25 mg by mouth 2 (two) times daily.    Historical Provider, MD  mirtazapine (REMERON) 30 MG tablet Take 30 mg by mouth at bedtime.      Historical Provider, MD  montelukast (SINGULAIR) 10 MG tablet Take 10 mg by mouth every morning.      Historical Provider, MD  Multiple Vitamin (MULTIVITAMIN WITH MINERALS) TABS Take 1 tablet by mouth daily.    Historical Provider, MD  omeprazole (PRILOSEC) 20 MG capsule Take 1 capsule (20 mg total) by mouth daily. For acid reflux. 04/14/14 04/14/15  Kathie Dike, MD  polyethylene glycol (MIRALAX / GLYCOLAX) packet Take 17 g by mouth daily.    Historical Provider, MD  potassium chloride SA (K-DUR,KLOR-CON) 20 MEQ tablet Take 20 mEq by mouth daily.    Historical Provider, MD  pravastatin (PRAVACHOL) 40 MG tablet Take 40 mg by mouth daily.    Historical Provider, MD   BP 182/93 mmHg  Pulse 90  Temp(Src) 98.3 F (36.8 C) (Oral)  Resp 26  Ht 5\' 5"  (1.651 m)  Wt 134 lb (60.782 kg)  BMI 22.30 kg/m2  SpO2 96% Physical Exam  Constitutional: She is oriented to person, place, and time. She appears ill. No distress.  HENT:  Head: Normocephalic and atraumatic.  Eyes: Conjunctivae and EOM are normal.  Cardiovascular: Normal rate.  An irregularly irregular rhythm present.  Pulmonary/Chest: Effort normal and breath sounds normal. No stridor. No respiratory distress.  Abdominal: She exhibits no distension.  Musculoskeletal: She exhibits no edema.  Neurological: She is alert and oriented to person, place, and time. No cranial nerve deficit.  Skin: Skin is warm and dry.  Psychiatric: She has a normal mood and  affect.  Nursing note and vitals reviewed.   ED Course  Procedures (including critical care time) Labs Review Labs Reviewed  COMPREHENSIVE METABOLIC PANEL - Abnormal; Notable for the following:    Potassium 2.7 (*)    Glucose, Bld 105 (*)    Creatinine, Ser 1.20 (*)    Calcium 8.3 (*)    GFR calc non Af Amer 41 (*)    GFR calc Af Amer 47 (*)    All other components within normal limits  CBC WITH DIFFERENTIAL/PLATELET - Abnormal; Notable for the following:    Hemoglobin 11.6 (*)    HCT 35.6 (*)    RDW  17.7 (*)    All other components within normal limits  TROPONIN I    Imaging Review Dg Chest 2 View  04/02/2015  CLINICAL DATA:  Weakness EXAM: CHEST  2 VIEW COMPARISON:  04/09/2014 FINDINGS: Cardiac shadow is enlarged. The lungs are well aerated bilaterally. No focal infiltrate or sizable effusion is seen. Degenerative change of thoracic spine is noted. IMPRESSION: No active cardiopulmonary disease. Electronically Signed   By: Inez Catalina M.D.   On: 04/02/2015 18:41   I have personally reviewed and evaluated these images and lab results as part of my medical decision-making.  EKG with atrial fibrillation, rate 88, abnormal On cardiac monitor, patient is atrial fibrillation, rate variable, 80/110 normal Pulse ox 96% room air normal   After the initial evaluation the patient's granddaughter emerged from the room, she states that the patient typically has anxiety, has been taking medication consistently,  Initial labs notable for hypokalemia.  Patient received repletion, oral.  Update:, Patient's heart rate now 80s, A. fib, abnormal Patient in no distress, states that she feels okay I discussed all findings with her, the need to take all medication as directed, follow up with primary care.   MDM  Elderly female presents with family members due to concerns of lightheadedness. Notably, the patient has multiple medical issues, including atrial fibrillation, chronic kidney  disease, acknowledges not taking her medication as directed. Here the patient is initially tachycardic, tachypneic, but improves with medication, including repletion of potassium. Patient's findings are otherwise reassuring, she was discharged in stable condition to follow-up with primary care.  Carmin Muskrat, MD 04/02/15 2040

## 2015-04-02 NOTE — Discharge Instructions (Signed)
As discussed, it is important that you follow up as soon as possible with your physician for continued management of your condition.  In addition, please be sure to take all medication as directed.  If you develop any new, or concerning changes in your condition, please return to the emergency department immediately.

## 2015-04-02 NOTE — ED Notes (Signed)
EDP made aware of pt's K of 2.7

## 2015-04-02 NOTE — ED Notes (Signed)
Per EMS, called out for pt being dizzy but states pt walked to the unit to meet EMS. EMS stated pt's family left for a short while and patient became upset because she was left alone

## 2016-03-30 ENCOUNTER — Encounter (HOSPITAL_COMMUNITY): Payer: Self-pay

## 2016-03-30 ENCOUNTER — Emergency Department (HOSPITAL_COMMUNITY): Payer: Medicare Other

## 2016-03-30 ENCOUNTER — Inpatient Hospital Stay (HOSPITAL_COMMUNITY)
Admission: EM | Admit: 2016-03-30 | Discharge: 2016-04-01 | DRG: 291 | Disposition: A | Discharge status: Home / Self Care | Payer: Medicare Other | Attending: Internal Medicine | Admitting: Internal Medicine

## 2016-03-30 DIAGNOSIS — N183 Chronic kidney disease, stage 3 unspecified: Secondary | ICD-10-CM

## 2016-03-30 DIAGNOSIS — Z7901 Long term (current) use of anticoagulants: Secondary | ICD-10-CM

## 2016-03-30 DIAGNOSIS — R0602 Shortness of breath: Secondary | ICD-10-CM | POA: Diagnosis not present

## 2016-03-30 DIAGNOSIS — E876 Hypokalemia: Secondary | ICD-10-CM | POA: Diagnosis present

## 2016-03-30 DIAGNOSIS — I482 Chronic atrial fibrillation, unspecified: Secondary | ICD-10-CM | POA: Diagnosis present

## 2016-03-30 DIAGNOSIS — I130 Hypertensive heart and chronic kidney disease with heart failure and stage 1 through stage 4 chronic kidney disease, or unspecified chronic kidney disease: Secondary | ICD-10-CM | POA: Diagnosis not present

## 2016-03-30 DIAGNOSIS — Z833 Family history of diabetes mellitus: Secondary | ICD-10-CM

## 2016-03-30 DIAGNOSIS — K219 Gastro-esophageal reflux disease without esophagitis: Secondary | ICD-10-CM | POA: Diagnosis present

## 2016-03-30 DIAGNOSIS — I5043 Acute on chronic combined systolic (congestive) and diastolic (congestive) heart failure: Secondary | ICD-10-CM | POA: Diagnosis present

## 2016-03-30 DIAGNOSIS — Z79899 Other long term (current) drug therapy: Secondary | ICD-10-CM

## 2016-03-30 DIAGNOSIS — I509 Heart failure, unspecified: Secondary | ICD-10-CM

## 2016-03-30 DIAGNOSIS — Z8249 Family history of ischemic heart disease and other diseases of the circulatory system: Secondary | ICD-10-CM

## 2016-03-30 DIAGNOSIS — I10 Essential (primary) hypertension: Secondary | ICD-10-CM

## 2016-03-30 LAB — I-STAT TROPONIN, ED: Troponin i, poc: 0.16 ng/mL (ref 0.00–0.08)

## 2016-03-30 LAB — BASIC METABOLIC PANEL
Anion gap: 9 (ref 5–15)
BUN: 26 mg/dL — ABNORMAL HIGH (ref 6–20)
CO2: 28 mmol/L (ref 22–32)
Calcium: 8.5 mg/dL — ABNORMAL LOW (ref 8.9–10.3)
Chloride: 105 mmol/L (ref 101–111)
Creatinine, Ser: 1.17 mg/dL — ABNORMAL HIGH (ref 0.44–1.00)
GFR calc Af Amer: 49 mL/min — ABNORMAL LOW (ref >60)
GFR calc non Af Amer: 42 mL/min — ABNORMAL LOW (ref >60)
Glucose, Bld: 151 mg/dL — ABNORMAL HIGH (ref 65–99)
Potassium: 2.7 mmol/L — CL (ref 3.5–5.1)
Sodium: 142 mmol/L (ref 135–145)

## 2016-03-30 LAB — CBC WITH DIFFERENTIAL/PLATELET
Basophils Absolute: 0 10*3/uL (ref 0.0–0.1)
Basophils Relative: 0 %
Eosinophils Absolute: 0.2 10*3/uL (ref 0.0–0.7)
Eosinophils Relative: 4 %
HCT: 36.3 % (ref 36.0–46.0)
Hemoglobin: 12.1 g/dL (ref 12.0–15.0)
Lymphocytes Relative: 26 %
Lymphs Abs: 1.1 10*3/uL (ref 0.7–4.0)
MCH: 29.4 pg (ref 26.0–34.0)
MCHC: 33.3 g/dL (ref 30.0–36.0)
MCV: 88.1 fL (ref 78.0–100.0)
Monocytes Absolute: 0.5 10*3/uL (ref 0.1–1.0)
Monocytes Relative: 12 %
Neutro Abs: 2.4 10*3/uL (ref 1.7–7.7)
Neutrophils Relative %: 58 %
Platelets: 172 10*3/uL (ref 150–400)
RBC: 4.12 MIL/uL (ref 3.87–5.11)
RDW: 17.1 % — ABNORMAL HIGH (ref 11.5–15.5)
WBC: 4.1 10*3/uL (ref 4.0–10.5)

## 2016-03-30 LAB — BRAIN NATRIURETIC PEPTIDE: B Natriuretic Peptide: 858 pg/mL — ABNORMAL HIGH (ref 0.0–100.0)

## 2016-03-30 LAB — TROPONIN I
Troponin I: 0.04 ng/mL (ref <0.03)
Troponin I: 0.06 ng/mL (ref <0.03)

## 2016-03-30 MED ORDER — MAGNESIUM SULFATE 2 GM/50ML IV SOLN
2.0000 g | Freq: Once | INTRAVENOUS | Status: AC
  Administered 2016-03-30: 2 g via INTRAVENOUS
  Filled 2016-03-30: qty 50

## 2016-03-30 MED ORDER — FAMOTIDINE 20 MG PO TABS
20.0000 mg | ORAL_TABLET | Freq: Every day | ORAL | Status: DC
  Administered 2016-03-31 – 2016-04-01 (×2): 20 mg via ORAL
  Filled 2016-03-30 (×2): qty 1

## 2016-03-30 MED ORDER — APIXABAN 5 MG PO TABS
5.0000 mg | ORAL_TABLET | Freq: Two times a day (BID) | ORAL | Status: DC
  Administered 2016-03-30 – 2016-04-01 (×4): 5 mg via ORAL
  Filled 2016-03-30 (×4): qty 1

## 2016-03-30 MED ORDER — HEPARIN SODIUM (PORCINE) 5000 UNIT/ML IJ SOLN: 5000.0000 [IU] | Freq: Three times a day (TID) | INTRAMUSCULAR | Status: DC

## 2016-03-30 MED ORDER — ONDANSETRON HCL 4 MG/2ML IJ SOLN: 4.0000 mg | Freq: Four times a day (QID) | INTRAMUSCULAR | Status: DC | PRN

## 2016-03-30 MED ORDER — SODIUM CHLORIDE 0.9% FLUSH: 3.0000 mL | INTRAVENOUS | Status: DC | PRN

## 2016-03-30 MED ORDER — FUROSEMIDE 10 MG/ML IJ SOLN
40.0000 mg | Freq: Once | INTRAMUSCULAR | Status: AC
  Administered 2016-03-30: 40 mg via INTRAVENOUS
  Filled 2016-03-30: qty 4

## 2016-03-30 MED ORDER — ACETAMINOPHEN 650 MG RE SUPP: 650.0000 mg | Freq: Four times a day (QID) | RECTAL | Status: DC | PRN

## 2016-03-30 MED ORDER — ONDANSETRON HCL 4 MG PO TABS: 4.0000 mg | ORAL_TABLET | Freq: Four times a day (QID) | ORAL | Status: DC | PRN

## 2016-03-30 MED ORDER — ALBUTEROL SULFATE (2.5 MG/3ML) 0.083% IN NEBU
5.0000 mg | INHALATION_SOLUTION | Freq: Once | RESPIRATORY_TRACT | Status: AC
  Administered 2016-03-30: 5 mg via RESPIRATORY_TRACT
  Filled 2016-03-30: qty 6

## 2016-03-30 MED ORDER — POTASSIUM CHLORIDE CRYS ER 20 MEQ PO TBCR
60.0000 meq | EXTENDED_RELEASE_TABLET | Freq: Once | ORAL | Status: AC
  Administered 2016-03-30: 60 meq via ORAL
  Filled 2016-03-30: qty 3

## 2016-03-30 MED ORDER — FLUTICASONE PROPIONATE 50 MCG/ACT NA SUSP
1.0000 | Freq: Every day | NASAL | Status: DC
  Administered 2016-03-31 – 2016-04-01 (×2): 1 via NASAL
  Filled 2016-03-30 (×2): qty 16

## 2016-03-30 MED ORDER — FUROSEMIDE 10 MG/ML IJ SOLN
40.0000 mg | Freq: Once | INTRAMUSCULAR | Status: AC
  Administered 2016-03-31: 40 mg via INTRAVENOUS
  Filled 2016-03-30: qty 4

## 2016-03-30 MED ORDER — ALLOPURINOL 100 MG PO TABS
100.0000 mg | ORAL_TABLET | Freq: Every day | ORAL | Status: DC
  Administered 2016-03-31 – 2016-04-01 (×2): 100 mg via ORAL
  Filled 2016-03-30 (×2): qty 1

## 2016-03-30 MED ORDER — POTASSIUM CHLORIDE CRYS ER 20 MEQ PO TBCR
40.0000 meq | EXTENDED_RELEASE_TABLET | Freq: Two times a day (BID) | ORAL | Status: DC
  Administered 2016-03-30 – 2016-04-01 (×4): 40 meq via ORAL
  Filled 2016-03-30 (×4): qty 2

## 2016-03-30 MED ORDER — SODIUM CHLORIDE 0.9% FLUSH
3.0000 mL | Freq: Two times a day (BID) | INTRAVENOUS | Status: DC
  Administered 2016-03-30 – 2016-04-01 (×3): 3 mL via INTRAVENOUS

## 2016-03-30 MED ORDER — PRAVASTATIN SODIUM 40 MG PO TABS
40.0000 mg | ORAL_TABLET | Freq: Every evening | ORAL | Status: DC
  Administered 2016-03-30 – 2016-03-31 (×2): 40 mg via ORAL
  Filled 2016-03-30 (×2): qty 1

## 2016-03-30 MED ORDER — DILTIAZEM HCL ER COATED BEADS 120 MG PO CP24
120.0000 mg | ORAL_CAPSULE | Freq: Every day | ORAL | Status: DC
  Administered 2016-03-31 – 2016-04-01 (×2): 120 mg via ORAL
  Filled 2016-03-30 (×2): qty 1

## 2016-03-30 MED ORDER — CARVEDILOL 12.5 MG PO TABS
12.5000 mg | ORAL_TABLET | Freq: Two times a day (BID) | ORAL | Status: DC
  Administered 2016-03-30 – 2016-04-01 (×4): 12.5 mg via ORAL
  Filled 2016-03-30 (×4): qty 1

## 2016-03-30 MED ORDER — SODIUM CHLORIDE 0.9 % IV SOLN
250.0000 mL | INTRAVENOUS | Status: DC | PRN
Start: 2016-03-30 — End: 2016-04-01

## 2016-03-30 MED ORDER — MONTELUKAST SODIUM 10 MG PO TABS
10.0000 mg | ORAL_TABLET | Freq: Every day | ORAL | Status: DC
  Administered 2016-03-31 – 2016-04-01 (×2): 10 mg via ORAL
  Filled 2016-03-30 (×2): qty 1

## 2016-03-30 MED ORDER — ACETAMINOPHEN 325 MG PO TABS: 650.0000 mg | ORAL_TABLET | Freq: Four times a day (QID) | ORAL | Status: DC | PRN

## 2016-03-30 NOTE — ED Notes (Signed)
CRITICAL VALUE ALERT  Critical value received: Troponin 0.04                                        K-2.7  Date of notification:  03/30/2016  Time of notification:  6742    Nurse who received alert: Susa Day  MD notified (1st page):  Dr. Vanita Panda  Time of first page: Notified at 1705

## 2016-03-30 NOTE — ED Triage Notes (Signed)
Sent by Urgent care in  [place] for shortness of breath x3 week with non-productive cough. Denies fever. Patient has auditory wheezing noted. Denies pain.

## 2016-03-30 NOTE — H&P (Signed)
History and Physical    [name] [name] ALP:[id number] DOB: Oct 08, 1930 DOA: 03/30/2016  PCP: [facility] Patient coming from: [facility]  Chief Complaint: sob  HPI: [name] [name] is a 83 y.o. female with medical history significant of GERD, A. fib, CHF, C KD, diverticulosis, gout, hypertension.  Patient wanting a progressive constant shortness of breath. Worse with ambulation/exertion. Denies orthopnea, chest pain, palpitations. Endorses weight gain and lower extremity swelling. Patient is not tried anything for her symptoms. Symptoms are improved with rest. Denies recent URI symptoms, fever, productive cough, neck stiffness, headache, vertigo/dizziness, abdominal pain, dysuria, frequency, nausea, vomiting, diarrhea.  ED Course: objective findings below.   Review of Systems: As per HPI otherwise 10 point review of systems negative.   Ambulatory Status: restricted by respiratory effort as above  Past Medical History:  Diagnosis Date  . Acid reflux   . ARF (acute renal failure) ([place]) 09/16/2010   Prerenal. Creatinine 1.36 before discharge.  . Atrial fibrillation ([place])   . CHF (congestive heart failure) (HCC)    Hx of diastolic CHF.  Marland Kitchen Chronic anticoagulation   . CKD (chronic kidney disease) stage 3, GFR 30-59 ml/min   . Diverticular hemorrhage 2011   Per colonoscopy  . Diverticulosis 2011   Per colonoscopy  . GI bleeding 08/03/2011   Not scoped but likely secondary to hemorrhoids or diverticulosis  . Gout   . Hx of medication noncompliance   . Hypercholesterolemia   . Hyperglycemia    Query DM 2  . Hypertension   . Internal hemorrhoids 2011   Per colonoscopy  . Seasonal allergies   . Thrombocytopenia due to drugs    Allopurinol    Past Surgical History:  Procedure Laterality Date  . APPENDECTOMY    . CATARACT EXTRACTION    . CESAREAN SECTION    . ESOPHAGOGASTRODUODENOSCOPY  08/20/09   small hiatal hernia/mild gastritis  . ileocolonoscopy   08/20/09   normal terminal ileum/pancolonic diverticula/no polyps/benign colon mucosa  . JOINT REPLACEMENT     Total right knee 2007  . LAPAROTOMY  09/22/2010   Procedure: EXPLORATORY LAPAROTOMY;  Surgeon: [name];  Location: [facility];  Service: General;  Laterality: N/A;  Lysis of Adhesions  . TUBAL LIGATION      Social History   Social History  . Marital status: Widowed    Spouse name: N/A  . Number of children: N/A  . Years of education: N/A   Occupational History  . Not on file.   Social History Main Topics  . Smoking status: Never Smoker  . Smokeless tobacco: Never Used  . Alcohol use No  . Drug use: No  . Sexual activity: Not on file   Other Topics Concern  . Not on file   Social History Narrative  . No narrative on file    Allergies  Allergen Reactions  . Codeine Nausea And Vomiting  . Sulfa Antibiotics Nausea And Vomiting    Family History  Problem Relation Age of Onset  . Heart disease Mother   . Diabetes Mother     Prior to Admission medications   Medication Sig Start Date End Date Taking? Authorizing Provider  allopurinol (ZYLOPRIM) 100 MG tablet Take 100 mg by mouth daily.   Yes Historical Provider, MD  apixaban (ELIQUIS) 5 MG TABS tablet Take 5 mg by mouth 2 (two) times daily.   Yes Historical Provider, MD  carvedilol (COREG) 12.5 MG tablet Take 12.5 mg by mouth 2 (two)  times daily with a meal.   Yes Historical Provider, MD  diltiazem (DILTIAZEM CD) 120 MG 24 hr capsule Take 120 mg by mouth daily.   Yes Historical Provider, MD  ferrous sulfate 325 (65 FE) MG tablet Take 325 mg by mouth 2 (two) times daily with a meal.    Yes Historical Provider, MD  fluticasone (FLONASE) 50 MCG/ACT nasal spray Place 1 spray into both nostrils daily.   Yes Historical Provider, MD  montelukast (SINGULAIR) 10 MG tablet Take 10 mg by mouth every morning.     Yes Historical Provider, MD  pravastatin (PRAVACHOL) 40 MG tablet Take 40 mg by mouth every evening.    Yes  Historical Provider, MD  ranitidine (ZANTAC) 150 MG tablet Take 150 mg by mouth daily.  02/03/16  Yes Historical Provider, MD    Physical Exam: Vitals:   03/30/16 1700 03/30/16 1702 03/30/16 1730 03/30/16 1800  BP: 160/66  155/88 160/82  Pulse: 61  100 70  Resp: 25  (!) 30 (!) 28  Temp:      TempSrc:      SpO2: 97% 97% 96% 94%  Weight:      Height:         General:  Appears calm and comfortable Eyes:  PERRL, EOMI, normal lids, iris ENT:  grossly normal hearing, lips & tongue, mmm Neck:  no LAD, masses or thyromegaly Cardiovascular:  RRR, 2/6 systolic murmur, irregularly irregular, 1+ bilateral lower extremity pitting edema Respiratory: Few crackles in the bases. Mild increased effort. No wheezes. Abdomen:  soft, ntnd, NABS Skin:  no rash or induration seen on limited exam Musculoskeletal:  grossly normal tone BUE/BLE, good ROM, no bony abnormality Psychiatric:  grossly normal mood and affect, speech fluent and appropriate, AOx3 Neurologic:  CN 2-12 grossly intact, moves all extremities in coordinated fashion, sensation intact  Labs on Admission: I have personally reviewed following labs and imaging studies  CBC:  Recent Labs Lab 03/30/16 1605  WBC 4.1  NEUTROABS 2.4  HGB 12.1  HCT 36.3  MCV 88.1  PLT 096   Basic Metabolic Panel:  Recent Labs Lab 03/30/16 1605  NA 142  K 2.7*  CL 105  CO2 28  GLUCOSE 151*  BUN 26*  CREATININE 1.17*  CALCIUM 8.5*   GFR: Estimated Creatinine Clearance: 36.8 mL/min (by C-G formula based on SCr of 1.17 mg/dL (H)). Liver Function Tests: No results for input(s): AST, ALT, ALKPHOS, BILITOT, PROT, ALBUMIN in the last 168 hours. No results for input(s): LIPASE, AMYLASE in the last 168 hours. No results for input(s): AMMONIA in the last 168 hours. Coagulation Profile: No results for input(s): INR, PROTIME in the last 168 hours. Cardiac Enzymes:  Recent Labs Lab 03/30/16 1605  TROPONINI 0.04*   BNP (last 3 results) No  results for input(s): PROBNP in the last 8760 hours. HbA1C: No results for input(s): HGBA1C in the last 72 hours. CBG: No results for input(s): GLUCAP in the last 168 hours. Lipid Profile: No results for input(s): CHOL, HDL, LDLCALC, TRIG, CHOLHDL, LDLDIRECT in the last 72 hours. Thyroid Function Tests: No results for input(s): TSH, T4TOTAL, FREET4, T3FREE, THYROIDAB in the last 72 hours. Anemia Panel: No results for input(s): VITAMINB12, FOLATE, FERRITIN, TIBC, IRON, RETICCTPCT in the last 72 hours. Urine analysis:    Component Value Date/Time   COLORURINE YELLOW 04/09/2014 2255   APPEARANCEUR CLEAR 04/09/2014 2255   LABSPEC 1.025 04/09/2014 2255   PHURINE 5.5 04/09/2014 2255   GLUCOSEU NEGATIVE 04/09/2014 2255  HGBUR NEGATIVE 04/09/2014 2255   BILIRUBINUR NEGATIVE 04/09/2014 2255   KETONESUR NEGATIVE 04/09/2014 2255   PROTEINUR NEGATIVE 04/09/2014 2255   UROBILINOGEN 0.2 04/09/2014 2255   NITRITE NEGATIVE 04/09/2014 2255   LEUKOCYTESUR NEGATIVE 04/09/2014 2255    Creatinine Clearance: Estimated Creatinine Clearance: 36.8 mL/min (by C-G formula based on SCr of 1.17 mg/dL (H)).  Sepsis Labs: @LABRCNTIP (procalcitonin:4,lacticidven:4) )No results found for this or any previous visit (from the past 240 hour(s)).   Radiological Exams on Admission: Dg Chest 2 View  Result Date: 03/30/2016 CLINICAL DATA:  Shortness of breath. EXAM: CHEST  2 VIEW COMPARISON:  04/02/2015 FINDINGS: There is chronic marked cardiomegaly. There is slight pulmonary vascular congestion as indicated by distention of the azygos vein. No discrete pulmonary edema. Suggestion of a small right pleural effusion. No acute bone abnormality. Marked degenerative changes of both shoulders. Previous resection of the distal left clavicle. IMPRESSION: Findings consistent with mild congestive heart failure. Electronically Signed   By: Lorriane Shire M.D.   On: 03/30/2016 16:09    EKG: Independently reviewed. Single PVC,  fib, No ACS  Assessment/Plan Active Problems:   Hypokalemia   Chronic a-fib (HCC)   CHF (congestive heart failure) (HCC)   Acute CHF (congestive heart failure) (HCC)   CKD (chronic kidney disease), stage III   Hypertension   Acute on chronic congestive heart failure: In short diastolic or systolic. Patient states she was taken off her diuretic several years ago due to worsening renal function. BNP 858, troponin 0.04, creatinine 1.17, potassium 2.7, his respiratory effort with chest x-ray concerning for CHF. No O2 requirement. Lower extremity edema present. - Echo - Lasix 40 IV BID x2 (likely to need PRN lasix from now on at home) - strict I/Om, dly wts - f/u Cardiology - cycle trop (doubt ACS but demand related) - Tele - continue bblocker  CKD: Cr 1.17. At baseline per pt. Aggressive diuresis so anticipate some elevation - BMET in am  Afib: rate controlled - continue dilt and coreg and Eliquis - Tele  HypoK: 2.7.  - Replete by IV and PO - Mag - BMP in am  Gout: - contineu allopurinol  HTN: - contineu dilt, coreg  Allergies/sinusitis: Chronic and at basleine - continue singulair, flonase    DVT prophylaxis: Eliquis  Code Status: full  Family Communication: sister  Disposition Plan: pending diuresis and improvement in respriatory status  Consults called: none  Admission status: observation    Warrick Llera J MD [facility]  If 7PM-7AM, please contact night-coverage [email or web address] Password [facility]  03/30/2016, 7:08 PM

## 2016-03-30 NOTE — ED Provider Notes (Signed)
[facility] Provider Note   CSN: [id number] Arrival date & time: 03/30/16  1520     History   Chief Complaint Chief Complaint  Patient presents with  . Shortness of Breath    HPI [name] [name] is a 83 y.o. female.  HPI  Patient presents with dyspnea. She is here with her daughter who assists with the history of present illness. Illness began possibly several weeks ago, but seemingly, over the past few days the patient has had increasing dyspnea, and rest and with exertion. No new chest pain, syncope. She also complains of lower extremity swelling. No fever, no chill, no cough. There is associated sinus congestion, and patient had epistaxis yesterday. No recent medication changes, diet changes, activity changes. Today the patient went to urgent care, was sent here for evaluation.  Past Medical History:  Diagnosis Date  . Acid reflux   . ARF (acute renal failure) ([place]) 09/16/2010   Prerenal. Creatinine 1.36 before discharge.  . Atrial fibrillation ([place])   . CHF (congestive heart failure) (HCC)    Hx of diastolic CHF.  Marland Kitchen Chronic anticoagulation   . CKD (chronic kidney disease) stage 3, GFR 30-59 ml/min   . Diverticular hemorrhage 2011   Per colonoscopy  . Diverticulosis 2011   Per colonoscopy  . GI bleeding 08/03/2011   Not scoped but likely secondary to hemorrhoids or diverticulosis  . Gout   . Hx of medication noncompliance   . Hypercholesterolemia   . Hyperglycemia    Query DM 2  . Hypertension   . Internal hemorrhoids 2011   Per colonoscopy  . Seasonal allergies   . Thrombocytopenia due to drugs    Allopurinol    Patient Active Problem List   Diagnosis Date Noted  . CHF (congestive heart failure) ([place]) 03/30/2016  . Acute renal failure syndrome ([place])   . Febrile illness   . Back pain 04/10/2014  . Fever 04/10/2014  . Bradycardia 08/04/2011  . GI bleeding 08/03/2011  . HTN (hypertension), malignant 08/03/2011  . Diverticulosis 08/03/2011    . Internal hemorrhoids 08/03/2011  . Hyperglycemia 08/03/2011  . Leukocytosis (leucocytosis) 09/25/2010  . Borderline abnormal TFTs 09/21/2010  . Hypocalcemia 09/18/2010  . Hypernatremia 09/18/2010  . Gout attack 09/18/2010  . Anemia 09/18/2010  . Hypotension 09/17/2010  . Thrombocytopenia ([place]) 09/17/2010  . DM type 2 (diabetes mellitus, type 2) ([place]) 09/17/2010  . Hypomagnesemia 09/17/2010  . SBO (small bowel obstruction) 09/16/2010  . ARF (acute renal failure) ([place]) 09/16/2010  . Chronic kidney disease (CKD), stage IV (severe) ([place]) 09/16/2010  . Hypokalemia 09/16/2010  . Chronic a-fib ([place]) 09/16/2010  . Warfarin anticoagulation 09/16/2010  . Elevated troponin I level 09/16/2010    Past Surgical History:  Procedure Laterality Date  . APPENDECTOMY    . CATARACT EXTRACTION    . CESAREAN SECTION    . ESOPHAGOGASTRODUODENOSCOPY  08/20/09   small hiatal hernia/mild gastritis  . ileocolonoscopy  08/20/09   normal terminal ileum/pancolonic diverticula/no polyps/benign colon mucosa  . JOINT REPLACEMENT     Total right knee 2007  . LAPAROTOMY  09/22/2010   Procedure: EXPLORATORY LAPAROTOMY;  Surgeon: [name];  Location: [facility];  Service: General;  Laterality: N/A;  Lysis of Adhesions  . TUBAL LIGATION      OB History    No data available       Home Medications    Prior to Admission medications   Medication Sig Start Date End Date Taking? Authorizing Provider  allopurinol (ZYLOPRIM)  100 MG tablet Take 100 mg by mouth daily.   Yes Historical Provider, MD  apixaban (ELIQUIS) 5 MG TABS tablet Take 5 mg by mouth 2 (two) times daily.   Yes Historical Provider, MD  carvedilol (COREG) 12.5 MG tablet Take 12.5 mg by mouth 2 (two) times daily with a meal.   Yes Historical Provider, MD  diltiazem (DILTIAZEM CD) 120 MG 24 hr capsule Take 120 mg by mouth daily.   Yes Historical Provider, MD  ferrous sulfate 325 (65 FE) MG tablet Take 325 mg by mouth 2 (two) times daily with a  meal.    Yes Historical Provider, MD  fluticasone (FLONASE) 50 MCG/ACT nasal spray Place 1 spray into both nostrils daily.   Yes Historical Provider, MD  montelukast (SINGULAIR) 10 MG tablet Take 10 mg by mouth every morning.     Yes Historical Provider, MD  pravastatin (PRAVACHOL) 40 MG tablet Take 40 mg by mouth every evening.    Yes Historical Provider, MD  ranitidine (ZANTAC) 150 MG tablet Take 150 mg by mouth daily.  02/03/16  Yes Historical Provider, MD    Family History Family History  Problem Relation Age of Onset  . Heart disease Mother   . Diabetes Mother     Social History Social History  Substance Use Topics  . Smoking status: Never Smoker  . Smokeless tobacco: Never Used  . Alcohol use No     Allergies   Codeine and Sulfa antibiotics   Review of Systems Review of Systems  Constitutional:       Per HPI, otherwise negative  HENT:       Per HPI, otherwise negative  Respiratory:       Per HPI, otherwise negative  Cardiovascular:       Per HPI, otherwise negative  Gastrointestinal: Negative for vomiting.  Endocrine:       Negative aside from HPI  Genitourinary:       Neg aside from HPI   Musculoskeletal:       Per HPI, otherwise negative  Skin: Negative.   Neurological: Negative for syncope.     Physical Exam Updated Vital Signs BP 142/78   Pulse 62   Temp 98.1 F (36.7 C) (Oral)   Resp 23   Ht 5\' 5"  (1.651 m)   Wt 164 lb (74.4 kg)   SpO2 97%   BMI 27.29 kg/m   Physical Exam  Constitutional: She is oriented to person, place, and time. She appears well-developed and well-nourished. No distress.  HENT:  Head: Normocephalic and atraumatic.  Eyes: Conjunctivae and EOM are normal.  Cardiovascular: Normal rate and regular rhythm.   Pulmonary/Chest: Effort normal. No stridor.  Diminished breath sounds throughout  Abdominal: She exhibits no distension.  Musculoskeletal:  Patient describes spine, but there is no appreciable edema, no pitting    Neurological: She is alert and oriented to person, place, and time. No cranial nerve deficit.  Skin: Skin is warm and dry.  Psychiatric: She has a normal mood and affect.  Nursing note and vitals reviewed.    ED Treatments / Results  Labs (all labs ordered are listed, but only abnormal results are displayed) Labs Reviewed  BASIC METABOLIC PANEL - Abnormal; Notable for the following:       Result Value   Potassium 2.7 (*)    Glucose, Bld 151 (*)    BUN 26 (*)    Creatinine, Ser 1.17 (*)    Calcium 8.5 (*)    GFR calc  non Af Amer 42 (*)    GFR calc Af Amer 49 (*)    All other components within normal limits  BRAIN NATRIURETIC PEPTIDE - Abnormal; Notable for the following:    B Natriuretic Peptide 858.0 (*)    All other components within normal limits  TROPONIN I - Abnormal; Notable for the following:    Troponin I 0.04 (*)    All other components within normal limits  CBC WITH DIFFERENTIAL/PLATELET - Abnormal; Notable for the following:    RDW 17.1 (*)    All other components within normal limits  I-STAT TROPOININ, ED - Abnormal; Notable for the following:    Troponin i, poc 0.16 (*)    All other components within normal limits    EKG  EKG Interpretation  Date/Time:  Friday March 30 2016 15:28:09 EST Ventricular Rate:  69 PR Interval:    QRS Duration: 86 QT Interval:  374 QTC Calculation: 395 R Axis:   111 Text Interpretation:  Atrial fibrillation Ventricular premature complex Right axis deviation Baseline wander T wave abnormality Abnormal ekg Confirmed by Carmin Muskrat  MD ([id number]) on 03/30/2016 3:50:46 PM     Pulse oximetry 97% with 2 L nasal cannula abnormal Cardiac 70 A. fib abnormal   Radiology Dg Chest 2 View  Result Date: 03/30/2016 CLINICAL DATA:  Shortness of breath. EXAM: CHEST  2 VIEW COMPARISON:  04/02/2015 FINDINGS: There is chronic marked cardiomegaly. There is slight pulmonary vascular congestion as indicated by distention of the azygos vein.  No discrete pulmonary edema. Suggestion of a small right pleural effusion. No acute bone abnormality. Marked degenerative changes of both shoulders. Previous resection of the distal left clavicle. IMPRESSION: Findings consistent with mild congestive heart failure. Electronically Signed   By: Lorriane Shire M.D.   On: 03/30/2016 16:09    Procedures Procedures (including critical care time)  Medications Ordered in ED Medications  furosemide (LASIX) injection 40 mg (not administered)  potassium chloride SA (K-DUR,KLOR-CON) CR tablet 60 mEq (not administered)  potassium chloride SA (K-DUR,KLOR-CON) CR tablet 40 mEq (not administered)  magnesium sulfate IVPB 2 g 50 mL (not administered)  albuterol (PROVENTIL) (2.5 MG/3ML) 0.083% nebulizer solution 5 mg (5 mg Nebulization Given 03/30/16 1701)     Initial Impression / Assessment and Plan / ED Course  I have reviewed the triage vital signs and the nursing notes.  Pertinent labs & imaging results that were available during my care of the patient were reviewed by me and considered in my medical decision making (see chart for details).  Update: Patient aware of all findings including elevated BNP, troponin, and hypokalemia. Chart review demonstrates the patient is not currently taking a diuretic. Patient will receive IV Lasix, oral potassium supplement. Patient history no chest pain, there is consideration of the patient's elevated troponin is likely demand ischemia given the elevated BNP, fluid overload status, absence of overt ischemic changes on EKG. However, given the patient's aforementioned lab abnormalities, her history of coronary disease, patient will require admission for further evaluation and management. Patient is already taking Eliquis, additional heparin not indicated.   Final Clinical Impressions(s) / ED Diagnoses  Elevated troponin Dyspnea Elevated BNP Hypokalemia Likely acute heart failure exacerbation  CRITICAL  CARE Performed by: Carmin Muskrat Total critical care time: 35 minutes Critical care time was exclusive of separately billable procedures and treating other patients. Critical care was necessary to treat or prevent imminent or life-threatening deterioration. Critical care was time spent personally by me on the following activities:  development of treatment plan with patient and/or surrogate as well as nursing, discussions with consultants, evaluation of patient's response to treatment, examination of patient, obtaining history from patient or surrogate, ordering and performing treatments and interventions, ordering and review of laboratory studies, ordering and review of radiographic studies, pulse oximetry and re-evaluation of patient's condition.    Carmin Muskrat, MD 03/30/16 [phone]

## 2016-03-31 ENCOUNTER — Observation Stay (HOSPITAL_BASED_OUTPATIENT_CLINIC_OR_DEPARTMENT_OTHER): Payer: Medicare Other

## 2016-03-31 DIAGNOSIS — I5043 Acute on chronic combined systolic (congestive) and diastolic (congestive) heart failure: Secondary | ICD-10-CM | POA: Diagnosis present

## 2016-03-31 DIAGNOSIS — N183 Chronic kidney disease, stage 3 (moderate): Secondary | ICD-10-CM | POA: Diagnosis present

## 2016-03-31 DIAGNOSIS — I130 Hypertensive heart and chronic kidney disease with heart failure and stage 1 through stage 4 chronic kidney disease, or unspecified chronic kidney disease: Secondary | ICD-10-CM | POA: Diagnosis present

## 2016-03-31 DIAGNOSIS — Z79899 Other long term (current) drug therapy: Secondary | ICD-10-CM | POA: Diagnosis not present

## 2016-03-31 DIAGNOSIS — I482 Chronic atrial fibrillation: Secondary | ICD-10-CM | POA: Diagnosis present

## 2016-03-31 DIAGNOSIS — Z8249 Family history of ischemic heart disease and other diseases of the circulatory system: Secondary | ICD-10-CM | POA: Diagnosis not present

## 2016-03-31 DIAGNOSIS — I509 Heart failure, unspecified: Secondary | ICD-10-CM | POA: Diagnosis not present

## 2016-03-31 DIAGNOSIS — K219 Gastro-esophageal reflux disease without esophagitis: Secondary | ICD-10-CM | POA: Diagnosis present

## 2016-03-31 DIAGNOSIS — Z7901 Long term (current) use of anticoagulants: Secondary | ICD-10-CM | POA: Diagnosis not present

## 2016-03-31 DIAGNOSIS — E876 Hypokalemia: Secondary | ICD-10-CM | POA: Diagnosis present

## 2016-03-31 DIAGNOSIS — R0602 Shortness of breath: Secondary | ICD-10-CM | POA: Diagnosis present

## 2016-03-31 DIAGNOSIS — Z833 Family history of diabetes mellitus: Secondary | ICD-10-CM | POA: Diagnosis not present

## 2016-03-31 LAB — CBC
HCT: 33.5 % — ABNORMAL LOW (ref 36.0–46.0)
Hemoglobin: 10.9 g/dL — ABNORMAL LOW (ref 12.0–15.0)
MCH: 29.1 pg (ref 26.0–34.0)
MCHC: 32.5 g/dL (ref 30.0–36.0)
MCV: 89.3 fL (ref 78.0–100.0)
Platelets: 174 10*3/uL (ref 150–400)
RBC: 3.75 MIL/uL — ABNORMAL LOW (ref 3.87–5.11)
RDW: 17.4 % — ABNORMAL HIGH (ref 11.5–15.5)
WBC: 4.1 10*3/uL (ref 4.0–10.5)

## 2016-03-31 LAB — BASIC METABOLIC PANEL
Anion gap: 8 (ref 5–15)
BUN: 30 mg/dL — ABNORMAL HIGH (ref 6–20)
CO2: 27 mmol/L (ref 22–32)
Calcium: 8.1 mg/dL — ABNORMAL LOW (ref 8.9–10.3)
Chloride: 110 mmol/L (ref 101–111)
Creatinine, Ser: 1.36 mg/dL — ABNORMAL HIGH (ref 0.44–1.00)
GFR calc Af Amer: 40 mL/min — ABNORMAL LOW (ref >60)
GFR calc non Af Amer: 35 mL/min — ABNORMAL LOW (ref >60)
Glucose, Bld: 159 mg/dL — ABNORMAL HIGH (ref 65–99)
Potassium: 4.1 mmol/L (ref 3.5–5.1)
Sodium: 145 mmol/L (ref 135–145)

## 2016-03-31 LAB — ECHOCARDIOGRAM COMPLETE
Height: 65 in
Weight: 2584 oz

## 2016-03-31 LAB — TROPONIN I
Troponin I: 0.03 ng/mL (ref <0.03)
Troponin I: 0.03 ng/mL (ref <0.03)

## 2016-03-31 MED ORDER — FUROSEMIDE 10 MG/ML IJ SOLN
20.0000 mg | Freq: Two times a day (BID) | INTRAMUSCULAR | Status: DC
  Administered 2016-03-31 – 2016-04-01 (×2): 20 mg via INTRAVENOUS
  Filled 2016-03-31 (×2): qty 2

## 2016-03-31 NOTE — Progress Notes (Signed)
PROGRESS NOTE    [name] [name]  WNI:[id number] DOB: 08/17/1930 DOA: 03/30/2016 PCP: [facility]     Brief Narrative:  Patient is an 83 year old woman admitted from home on 2/23 due to shortness of breath worse with exertion. Thought to have acute CHF and admission requested.   Assessment & Plan:   Principal Problem:   Acute on chronic combined systolic and diastolic CHF (congestive heart failure) (HCC) Active Problems:   Hypokalemia   Chronic a-fib (HCC)   CHF (congestive heart failure) (HCC)   CKD (chronic kidney disease), stage III   Hypertension   Acute combined CHF -Echo with ejection fraction of 45-50% with high ventricular filling pressures and restrictive physiology, no regional wall motion abnormality. -Start Lasix 20 mg IV twice a day. -Is 750 mL negative since admission, still appears hypervolemic on exam. -Continue beta blocker, not on ACE inhibitor due to advanced chronic kidney disease.  Elevated troponin -Flat (maximum 0.06), no chest pain, suspect due to acute CHF. -No plans for further cardiac workup anticipated at present.  Acute on Chronic kidney disease stage III -Baseline creatinine around 1.1-1.2. -Creatinine is baseline at 1.36 today, likely related to ongoing diuresis.  A. Fib -Weight controlled, continue diltiazem, Coreg, anticoagulated on eliquis.   DVT prophylaxis: Eliquis Code Status: Full Code Family Communication: patient only Disposition Plan: home in 24-48 hours  Consultants:   None  Procedures:   None  Antimicrobials:  Anti-infectives    None       Subjective: Feels less SOB  Objective: Vitals:   03/30/16 2126 03/31/16 0530 03/31/16 1128 03/31/16 1425  BP: 138/80 (!) 137/53 (!) 159/71 (!) 154/91  Pulse: 65 70 70 (!) 55  Resp: 18 18  18   Temp: 98.2 F (36.8 C) 97.7 F (36.5 C) 98 F (36.7 C) 97.9 F (36.6 C)  TempSrc: Oral Oral Oral   SpO2: 100% 100% 100% 99%  Weight: 73.7 kg (162  lb 6.4 oz) 73.3 kg (161 lb 8 oz)    Height: 5\' 5"  (1.651 m)       Intake/Output Summary (Last 24 hours) at 03/31/16 1731 Last data filed at 03/30/16 2043  Gross per 24 hour  Intake               50 ml  Output              800 ml  Net             -750 ml   Filed Weights   03/30/16 1531 03/30/16 2126 03/31/16 0530  Weight: 74.4 kg (164 lb) 73.7 kg (162 lb 6.4 oz) 73.3 kg (161 lb 8 oz)    Examination:  General exam: Alert, awake, oriented x 3 Respiratory system: Clear to auscultation. Respiratory effort normal. Cardiovascular system:RRR. No murmurs, rubs, gallops. Gastrointestinal system: Abdomen is nondistended, soft and nontender. No organomegaly or masses felt. Normal bowel sounds heard. Central nervous system: Alert and oriented. No focal neurological deficits. Extremities: 1-2+ edema Skin: No rashes, lesions or ulcers Psychiatry: Judgement and insight appear normal. Mood & affect appropriate.     Data Reviewed: I have personally reviewed following labs and imaging studies  CBC:  Recent Labs Lab 03/30/16 1605 03/31/16 0636  WBC 4.1 4.1  NEUTROABS 2.4  --   HGB 12.1 10.9*  HCT 36.3 33.5*  MCV 88.1 89.3  PLT 172 381   Basic Metabolic Panel:  Recent Labs Lab 03/30/16 1605 03/31/16 0636  NA 142 145  K 2.7* 4.1  CL 105 110  CO2 28 27  GLUCOSE 151* 159*  BUN 26* 30*  CREATININE 1.17* 1.36*  CALCIUM 8.5* 8.1*   GFR: Estimated Creatinine Clearance: 31.4 mL/min (by C-G formula based on SCr of 1.36 mg/dL (H)). Liver Function Tests: No results for input(s): AST, ALT, ALKPHOS, BILITOT, PROT, ALBUMIN in the last 168 hours. No results for input(s): LIPASE, AMYLASE in the last 168 hours. No results for input(s): AMMONIA in the last 168 hours. Coagulation Profile: No results for input(s): INR, PROTIME in the last 168 hours. Cardiac Enzymes:  Recent Labs Lab 03/30/16 1605 03/30/16 1928 03/31/16 0055 03/31/16 0636  TROPONINI 0.04* 0.06* 0.03* 0.03*   BNP  (last 3 results) No results for input(s): PROBNP in the last 8760 hours. HbA1C: No results for input(s): HGBA1C in the last 72 hours. CBG: No results for input(s): GLUCAP in the last 168 hours. Lipid Profile: No results for input(s): CHOL, HDL, LDLCALC, TRIG, CHOLHDL, LDLDIRECT in the last 72 hours. Thyroid Function Tests: No results for input(s): TSH, T4TOTAL, FREET4, T3FREE, THYROIDAB in the last 72 hours. Anemia Panel: No results for input(s): VITAMINB12, FOLATE, FERRITIN, TIBC, IRON, RETICCTPCT in the last 72 hours. Urine analysis:    Component Value Date/Time   COLORURINE YELLOW 04/09/2014 2255   APPEARANCEUR CLEAR 04/09/2014 2255   LABSPEC 1.025 04/09/2014 2255   PHURINE 5.5 04/09/2014 2255   GLUCOSEU NEGATIVE 04/09/2014 2255   HGBUR NEGATIVE 04/09/2014 2255   BILIRUBINUR NEGATIVE 04/09/2014 2255   KETONESUR NEGATIVE 04/09/2014 2255   PROTEINUR NEGATIVE 04/09/2014 2255   UROBILINOGEN 0.2 04/09/2014 2255   NITRITE NEGATIVE 04/09/2014 2255   LEUKOCYTESUR NEGATIVE 04/09/2014 2255   Sepsis Labs: @LABRCNTIP (procalcitonin:4,lacticidven:4)  )No results found for this or any previous visit (from the past 240 hour(s)).       Radiology Studies: Dg Chest 2 View  Result Date: 03/30/2016 CLINICAL DATA:  Shortness of breath. EXAM: CHEST  2 VIEW COMPARISON:  04/02/2015 FINDINGS: There is chronic marked cardiomegaly. There is slight pulmonary vascular congestion as indicated by distention of the azygos vein. No discrete pulmonary edema. Suggestion of a small right pleural effusion. No acute bone abnormality. Marked degenerative changes of both shoulders. Previous resection of the distal left clavicle. IMPRESSION: Findings consistent with mild congestive heart failure. Electronically Signed   By: Lorriane Shire M.D.   On: 03/30/2016 16:09        Scheduled Meds: . allopurinol  100 mg Oral Daily  . apixaban  5 mg Oral BID  . carvedilol  12.5 mg Oral BID WC  . diltiazem  120 mg  Oral Daily  . famotidine  20 mg Oral Daily  . fluticasone  1 spray Each Nare Daily  . furosemide  20 mg Intravenous Q12H  . montelukast  10 mg Oral Daily  . potassium chloride  40 mEq Oral BID  . pravastatin  40 mg Oral QPM  . sodium chloride flush  3 mL Intravenous Q12H   Continuous Infusions:   LOS: 0 days    Time spent: 25 minutes. Greater than 50% of this time was spent in direct contact with the patient coordinating care.     Lelon Frohlich, MD [facility] Pager [phone]  If 7PM-7AM, please contact night-coverage [email or web address] Password [facility] 03/31/2016, 5:31 PM

## 2016-03-31 NOTE — Progress Notes (Signed)
  Echocardiogram 2D Echocardiogram has been performed.  Diamond Nickel 03/31/2016, 10:44 AM

## 2016-04-01 LAB — BASIC METABOLIC PANEL
Anion gap: 9 (ref 5–15)
BUN: 36 mg/dL — ABNORMAL HIGH (ref 6–20)
CO2: 28 mmol/L (ref 22–32)
Calcium: 8.5 mg/dL — ABNORMAL LOW (ref 8.9–10.3)
Chloride: 107 mmol/L (ref 101–111)
Creatinine, Ser: 1.51 mg/dL — ABNORMAL HIGH (ref 0.44–1.00)
GFR calc Af Amer: 36 mL/min — ABNORMAL LOW (ref >60)
GFR calc non Af Amer: 31 mL/min — ABNORMAL LOW (ref >60)
Glucose, Bld: 125 mg/dL — ABNORMAL HIGH (ref 65–99)
Potassium: 4.7 mmol/L (ref 3.5–5.1)
Sodium: 144 mmol/L (ref 135–145)

## 2016-04-01 MED ORDER — POTASSIUM CHLORIDE CRYS ER 20 MEQ PO TBCR: 20.0000 meq | EXTENDED_RELEASE_TABLET | Freq: Every day | ORAL | 2 refills | Status: AC

## 2016-04-01 MED ORDER — FUROSEMIDE 40 MG PO TABS: 40.0000 mg | ORAL_TABLET | Freq: Every day | ORAL | 2 refills | Status: DC

## 2016-04-01 NOTE — Discharge Summary (Signed)
Physician Discharge Summary  JAHNAYA BRANSCOME HCW:[id number] DOB: October 29, 1930 DOA: 03/30/2016  PCP: [facility] date: 03/30/2016 Discharge date: 04/01/2016  Time spent: 45 minutes  Recommendations for Outpatient Follow-up:  -Will be discharged home today. -Advised to follow up with PCP in 1-2 weeks.   Discharge Diagnoses:  Principal Problem:   Acute on chronic combined systolic and diastolic CHF (congestive heart failure) (HCC) Active Problems:   Hypokalemia   Chronic a-fib (HCC)   CHF (congestive heart failure) (HCC)   CKD (chronic kidney disease), stage III   Hypertension   Discharge Condition: Stable and improved  Filed Weights   03/30/16 2126 03/31/16 0530 04/01/16 0500  Weight: 73.7 kg (162 lb 6.4 oz) 73.3 kg (161 lb 8 oz) 73.5 kg (162 lb 1.6 oz)    History of present illness:  As per Dr. Marily Memos on 2/23: [name] [name] is a 83 y.o. female with medical history significant of GERD, A. fib, CHF, C KD, diverticulosis, gout, hypertension.  Patient wanting a progressive constant shortness of breath. Worse with ambulation/exertion. Denies orthopnea, chest pain, palpitations. Endorses weight gain and lower extremity swelling. Patient is not tried anything for her symptoms. Symptoms are improved with rest. Denies recent URI symptoms, fever, productive cough, neck stiffness, headache, vertigo/dizziness, abdominal pain, dysuria, frequency, nausea, vomiting, diarrhea.  ED Course: objective findings below.   Hospital Course:   Acute combined CHF -Echo with ejection fraction of 45-50% with high ventricular filling pressures and restrictive physiology, no regional wall motion abnormality. -Start Lasix 20 mg IV twice a day. -Is 750 mL negative since admission, is now euvolemic. -Continue beta blocker, not on ACE inhibitor due to advanced chronic kidney disease.  Elevated troponin -Flat (maximum 0.06), no chest pain, suspect due to acute CHF. -No  plans for further cardiac workup anticipated at present.  Acute on Chronic kidney disease stage III -Baseline creatinine around 1.1-1.2. -Creatinine is baseline at 1.36 today, likely related to ongoing diuresis.  A. Fib -Weight controlled, continue diltiazem, Coreg, anticoagulated on eliquis.  Procedures:  None   Consultations:  None  Discharge Instructions  Discharge Instructions    Diet - low sodium heart healthy    Complete by:  As directed    Increase activity slowly    Complete by:  As directed      Allergies as of 04/01/2016      Reactions   Codeine Nausea And Vomiting   Sulfa Antibiotics Nausea And Vomiting      Medication List    TAKE these medications   allopurinol 100 MG tablet Commonly known as:  ZYLOPRIM Take 100 mg by mouth daily.   carvedilol 12.5 MG tablet Commonly known as:  COREG Take 12.5 mg by mouth 2 (two) times daily with a meal.   DILTIAZEM CD 120 MG 24 hr capsule Generic drug:  diltiazem Take 120 mg by mouth daily.   ELIQUIS 5 MG Tabs tablet Generic drug:  apixaban Take 5 mg by mouth 2 (two) times daily.   ferrous sulfate 325 (65 FE) MG tablet Take 325 mg by mouth 2 (two) times daily with a meal.   fluticasone 50 MCG/ACT nasal spray Commonly known as:  FLONASE Place 1 spray into both nostrils daily.   furosemide 40 MG tablet Commonly known as:  LASIX Take 1 tablet (40 mg total) by mouth daily.   montelukast 10 MG tablet Commonly known as:  SINGULAIR Take 10 mg by mouth every morning.  potassium chloride SA 20 MEQ tablet Commonly known as:  K-DUR,KLOR-CON Take 1 tablet (20 mEq total) by mouth daily.   pravastatin 40 MG tablet Commonly known as:  PRAVACHOL Take 40 mg by mouth every evening.   ranitidine 150 MG tablet Commonly known as:  ZANTAC Take 150 mg by mouth daily.      Allergies  Allergen Reactions  . Codeine Nausea And Vomiting  . Sulfa Antibiotics Nausea And Vomiting   Follow-up [place]  [facility]. Schedule an appointment as soon as possible for a visit in 2 week(s).   Contact information: [address] [place] [address] [phone]            The results of significant diagnostics from this hospitalization (including imaging, microbiology, ancillary and laboratory) are listed below for reference.    Significant Diagnostic Studies: Dg Chest 2 View  Result Date: 03/30/2016 CLINICAL DATA:  Shortness of breath. EXAM: CHEST  2 VIEW COMPARISON:  04/02/2015 FINDINGS: There is chronic marked cardiomegaly. There is slight pulmonary vascular congestion as indicated by distention of the azygos vein. No discrete pulmonary edema. Suggestion of a small right pleural effusion. No acute bone abnormality. Marked degenerative changes of both shoulders. Previous resection of the distal left clavicle. IMPRESSION: Findings consistent with mild congestive heart failure. Electronically Signed   By: Lorriane Shire M.D.   On: 03/30/2016 16:09    Microbiology: No results found for this or any previous visit (from the past 240 hour(s)).   Labs: Basic Metabolic Panel:  Recent Labs Lab 03/30/16 1605 03/31/16 0636 04/01/16 0639  NA 142 145 144  K 2.7* 4.1 4.7  CL 105 110 107  CO2 28 27 28   GLUCOSE 151* 159* 125*  BUN 26* 30* 36*  CREATININE 1.17* 1.36* 1.51*  CALCIUM 8.5* 8.1* 8.5*   Liver Function Tests: No results for input(s): AST, ALT, ALKPHOS, BILITOT, PROT, ALBUMIN in the last 168 hours. No results for input(s): LIPASE, AMYLASE in the last 168 hours. No results for input(s): AMMONIA in the last 168 hours. CBC:  Recent Labs Lab 03/30/16 1605 03/31/16 0636  WBC 4.1 4.1  NEUTROABS 2.4  --   HGB 12.1 10.9*  HCT 36.3 33.5*  MCV 88.1 89.3  PLT 172 174   Cardiac Enzymes:  Recent Labs Lab 03/30/16 1605 03/30/16 1928 03/31/16 0055 03/31/16 0636  TROPONINI 0.04* 0.06* 0.03* 0.03*   BNP: BNP (last 3 results)  Recent Labs  03/30/16 1605   BNP 858.0*    ProBNP (last 3 results) No results for input(s): PROBNP in the last 8760 hours.  CBG: No results for input(s): GLUCAP in the last 168 hours.     SignedLelon Frohlich  [facility] Pager: [phone] 04/01/2016, 4:49 PM

## 2016-04-01 NOTE — Progress Notes (Signed)
Pt discharged in stable condition via wheelchair into the care of her daughter via private vehicle.  PIV removed intact and w/o S&S of complications.  Discharge instructions reviewed with pt/daughter.  Pt/daughter verbalized understanding.  Prescription given to pt.

## 2016-04-02 ENCOUNTER — Other Ambulatory Visit (HOSPITAL_COMMUNITY): Payer: Self-pay | Admitting: Emergency Medicine

## 2016-04-02 ENCOUNTER — Ambulatory Visit (HOSPITAL_COMMUNITY)
Admission: RE | Admit: 2016-04-02 | Discharge: 2016-04-02 | Disposition: A | Discharge status: Home / Self Care | Payer: Medicare Other | Source: Ambulatory Visit | Attending: Emergency Medicine | Admitting: Emergency Medicine

## 2016-04-02 DIAGNOSIS — R51 Headache: Secondary | ICD-10-CM

## 2016-04-02 DIAGNOSIS — G319 Degenerative disease of nervous system, unspecified: Secondary | ICD-10-CM | POA: Diagnosis not present

## 2016-04-02 DIAGNOSIS — I6782 Cerebral ischemia: Secondary | ICD-10-CM | POA: Diagnosis not present

## 2016-04-02 DIAGNOSIS — R519 Headache, unspecified: Secondary | ICD-10-CM

## 2016-04-02 DIAGNOSIS — G8929 Other chronic pain: Secondary | ICD-10-CM

## 2016-04-02 DIAGNOSIS — R52 Pain, unspecified: Secondary | ICD-10-CM

## 2016-06-19 ENCOUNTER — Other Ambulatory Visit: Payer: Self-pay

## 2016-06-19 ENCOUNTER — Emergency Department (HOSPITAL_COMMUNITY): Payer: Medicare Other

## 2016-06-19 ENCOUNTER — Emergency Department (HOSPITAL_COMMUNITY)
Admission: EM | Admit: 2016-06-19 | Discharge: 2016-06-19 | Disposition: A | Discharge status: Home / Self Care | Payer: Medicare Other | Attending: Emergency Medicine | Admitting: Emergency Medicine

## 2016-06-19 ENCOUNTER — Encounter (HOSPITAL_COMMUNITY): Payer: Self-pay | Admitting: *Deleted

## 2016-06-19 DIAGNOSIS — I509 Heart failure, unspecified: Secondary | ICD-10-CM | POA: Diagnosis not present

## 2016-06-19 DIAGNOSIS — K921 Melena: Secondary | ICD-10-CM | POA: Insufficient documentation

## 2016-06-19 DIAGNOSIS — R1032 Left lower quadrant pain: Secondary | ICD-10-CM | POA: Diagnosis not present

## 2016-06-19 DIAGNOSIS — R0602 Shortness of breath: Secondary | ICD-10-CM | POA: Insufficient documentation

## 2016-06-19 DIAGNOSIS — R1033 Periumbilical pain: Secondary | ICD-10-CM | POA: Diagnosis present

## 2016-06-19 DIAGNOSIS — I130 Hypertensive heart and chronic kidney disease with heart failure and stage 1 through stage 4 chronic kidney disease, or unspecified chronic kidney disease: Secondary | ICD-10-CM | POA: Insufficient documentation

## 2016-06-19 DIAGNOSIS — N184 Chronic kidney disease, stage 4 (severe): Secondary | ICD-10-CM | POA: Insufficient documentation

## 2016-06-19 DIAGNOSIS — E1122 Type 2 diabetes mellitus with diabetic chronic kidney disease: Secondary | ICD-10-CM | POA: Insufficient documentation

## 2016-06-19 DIAGNOSIS — M549 Dorsalgia, unspecified: Secondary | ICD-10-CM | POA: Diagnosis not present

## 2016-06-19 DIAGNOSIS — Z79899 Other long term (current) drug therapy: Secondary | ICD-10-CM | POA: Insufficient documentation

## 2016-06-19 LAB — COMPREHENSIVE METABOLIC PANEL
ALT: 13 U/L — ABNORMAL LOW (ref 14–54)
AST: 17 U/L (ref 15–41)
Albumin: 4.4 g/dL (ref 3.5–5.0)
Alkaline Phosphatase: 69 U/L (ref 38–126)
Anion gap: 11 (ref 5–15)
BUN: 30 mg/dL — ABNORMAL HIGH (ref 6–20)
CO2: 23 mmol/L (ref 22–32)
Calcium: 9.9 mg/dL (ref 8.9–10.3)
Chloride: 109 mmol/L (ref 101–111)
Creatinine, Ser: 1.76 mg/dL — ABNORMAL HIGH (ref 0.44–1.00)
GFR calc Af Amer: 29 mL/min — ABNORMAL LOW (ref >60)
GFR calc non Af Amer: 25 mL/min — ABNORMAL LOW (ref >60)
Glucose, Bld: 103 mg/dL — ABNORMAL HIGH (ref 65–99)
Potassium: 4.2 mmol/L (ref 3.5–5.1)
Sodium: 143 mmol/L (ref 135–145)
Total Bilirubin: 1.3 mg/dL — ABNORMAL HIGH (ref 0.3–1.2)
Total Protein: 7.9 g/dL (ref 6.5–8.1)

## 2016-06-19 LAB — CBC
HCT: 43 % (ref 36.0–46.0)
Hemoglobin: 14.6 g/dL (ref 12.0–15.0)
MCH: 29.6 pg (ref 26.0–34.0)
MCHC: 34 g/dL (ref 30.0–36.0)
MCV: 87 fL (ref 78.0–100.0)
Platelets: 169 10*3/uL (ref 150–400)
RBC: 4.94 MIL/uL (ref 3.87–5.11)
RDW: 17 % — ABNORMAL HIGH (ref 11.5–15.5)
WBC: 4 10*3/uL (ref 4.0–10.5)

## 2016-06-19 LAB — POC OCCULT BLOOD, ED: Fecal Occult Bld: NEGATIVE

## 2016-06-19 LAB — URINALYSIS, ROUTINE W REFLEX MICROSCOPIC
Bacteria, UA: NONE SEEN
Bilirubin Urine: NEGATIVE
Glucose, UA: NEGATIVE mg/dL
Hgb urine dipstick: NEGATIVE
Ketones, ur: NEGATIVE mg/dL
Leukocytes, UA: NEGATIVE
Nitrite: NEGATIVE
Protein, ur: 30 mg/dL — AB
Specific Gravity, Urine: 1.009 (ref 1.005–1.030)
pH: 5 (ref 5.0–8.0)

## 2016-06-19 LAB — LIPASE, BLOOD: Lipase: 23 U/L (ref 11–51)

## 2016-06-19 MED ORDER — OMEPRAZOLE 20 MG PO CPDR
20.0000 mg | DELAYED_RELEASE_CAPSULE | Freq: Every day | ORAL | 2 refills | Status: AC
Start: 2016-06-19 — End: ?

## 2016-06-19 NOTE — ED Provider Notes (Signed)
[facility] DEPT Provider Note   CSN: [id number] Arrival date & time: 06/19/16  1717     History   Chief Complaint Chief Complaint  Patient presents with  . Abdominal Pain    HPI [name] [name] is a 83 y.o. female with a PMhx significant for A-fib on Eliquis, DM2, CKD IV, SBO (2013), and GERD who presents to the [facility] emergency department after being seen at [facility] for periumbilical pain with radiation to the left back. The patient states the pain has been going on for some weeks. It occurs on/off about 2-3x/day for 5-10 minutes. The pain is describes as a pushing/bulgin that is not associated with any aggravating factors. She notes the pain is a 5/10 in the morning, but is usually relieved when she takes tylenol. Her last bout of pain was at 3am this morning. She states at this time she also became SOB. The patient admits to dark tarry stools, but is also on ferrous sulfate. She has had a decrease in appetite since this began. She denies any N/V/D/C, reflux, yellowing of skin, increase in urinary frequency, urgency, dysuria, or hematuria. Her last BM was last night. She has had an appendectomy, tubal ligation, c-section, and laparoscopy for SBO in the past.   She also notes a bout of chest fullness that occurred yesterday that was unrelated to the abdominal pain. The pain was substernal and last for a few minutes while she was at rest. It has not occurred since then.   HPI  Past Medical History:  Diagnosis Date  . Acid reflux   . ARF (acute renal failure) ([place]) 09/16/2010   Prerenal. Creatinine 1.36 before discharge.  . Atrial fibrillation ([place])   . CHF (congestive heart failure) (HCC)    Hx of diastolic CHF.  Marland Kitchen Chronic anticoagulation   . CKD (chronic kidney disease) stage 3, GFR 30-59 ml/min   . Diverticular hemorrhage 2011   Per colonoscopy  . Diverticulosis 2011   Per colonoscopy  . GI bleeding 08/03/2011   Not scoped but likely secondary to  hemorrhoids or diverticulosis  . Gout   . Hx of medication noncompliance   . Hypercholesterolemia   . Hyperglycemia    Query DM 2  . Hypertension   . Internal hemorrhoids 2011   Per colonoscopy  . Seasonal allergies   . Thrombocytopenia due to drugs    Allopurinol    Patient Active Problem List   Diagnosis Date Noted  . CHF (congestive heart failure) ([place]) 03/30/2016  . Acute on chronic combined systolic and diastolic CHF (congestive heart failure) ([place]) 03/30/2016  . CKD (chronic kidney disease), stage III 03/30/2016  . Hypertension 03/30/2016  . Acute renal failure syndrome ([place])   . Febrile illness   . Back pain 04/10/2014  . Fever 04/10/2014  . Bradycardia 08/04/2011  . GI bleeding 08/03/2011  . HTN (hypertension), malignant 08/03/2011  . Diverticulosis 08/03/2011  . Internal hemorrhoids 08/03/2011  . Hyperglycemia 08/03/2011  . Leukocytosis (leucocytosis) 09/25/2010  . Borderline abnormal TFTs 09/21/2010  . Hypocalcemia 09/18/2010  . Hypernatremia 09/18/2010  . Gout attack 09/18/2010  . Anemia 09/18/2010  . Hypotension 09/17/2010  . Thrombocytopenia ([place]) 09/17/2010  . DM type 2 (diabetes mellitus, type 2) ([place]) 09/17/2010  . Hypomagnesemia 09/17/2010  . SBO (small bowel obstruction) ([place]) 09/16/2010  . ARF (acute renal failure) ([place]) 09/16/2010  . Chronic kidney disease (CKD), stage IV (severe) ([place]) 09/16/2010  . Hypokalemia 09/16/2010  . Chronic a-fib ([place]) 09/16/2010  .  Warfarin anticoagulation 09/16/2010  . Elevated troponin I level 09/16/2010    Past Surgical History:  Procedure Laterality Date  . APPENDECTOMY    . CATARACT EXTRACTION    . CESAREAN SECTION    . ESOPHAGOGASTRODUODENOSCOPY  08/20/09   small hiatal hernia/mild gastritis  . ileocolonoscopy  08/20/09   normal terminal ileum/pancolonic diverticula/no polyps/benign colon mucosa  . JOINT REPLACEMENT     Total right knee 2007  . LAPAROTOMY  09/22/2010   Procedure: EXPLORATORY LAPAROTOMY;   Surgeon: [name];  Location: [facility];  Service: General;  Laterality: N/A;  Lysis of Adhesions  . TUBAL LIGATION      OB History    No data available       Home Medications    Prior to Admission medications   Medication Sig Start Date End Date Taking? Authorizing Provider  allopurinol (ZYLOPRIM) 100 MG tablet Take 100 mg by mouth daily.    [provider]  apixaban (ELIQUIS) 5 MG TABS tablet Take 5 mg by mouth 2 (two) times daily.    [provider]  carvedilol (COREG) 12.5 MG tablet Take 12.5 mg by mouth 2 (two) times daily with a meal.    [provider]  diltiazem (DILTIAZEM CD) 120 MG 24 hr capsule Take 120 mg by mouth daily.    [provider]  ferrous sulfate 325 (65 FE) MG tablet Take 325 mg by mouth 2 (two) times daily with a meal.     [provider]  fluticasone (FLONASE) 50 MCG/ACT nasal spray Place 1 spray into both nostrils daily.    [provider]  furosemide (LASIX) 40 MG tablet Take 1 tablet (40 mg total) by mouth daily. 04/01/16   Isaac Bliss, Rayford Halsted, MD  montelukast (SINGULAIR) 10 MG tablet Take 10 mg by mouth every morning.      [provider]  potassium chloride SA (K-DUR,KLOR-CON) 20 MEQ tablet Take 1 tablet (20 mEq total) by mouth daily. 04/01/16   Isaac Bliss, Rayford Halsted, MD  pravastatin (PRAVACHOL) 40 MG tablet Take 40 mg by mouth every evening.     [provider]  ranitidine (ZANTAC) 150 MG tablet Take 150 mg by mouth daily.  02/03/16   [provider]    Family History Family History  Problem Relation Age of Onset  . Heart disease Mother   . Diabetes Mother     Social History Social History  Substance Use Topics  . Smoking status: Never Smoker  . Smokeless tobacco: Never Used  . Alcohol use No     Allergies   Codeine and Sulfa antibiotics   Review of Systems Review of Systems  Constitutional: Positive for chills.  Respiratory: Positive for  shortness of breath.   Gastrointestinal: Positive for abdominal pain and blood in stool ( melena). Negative for abdominal distention, anal bleeding, constipation, diarrhea, nausea and vomiting.  Genitourinary: Negative for difficulty urinating, dyspareunia, dysuria, flank pain, frequency, hematuria and urgency.  Musculoskeletal: Positive for back pain.     Physical Exam Updated Vital Signs BP (!) 162/109 (BP Location: Left Arm)   Pulse 69   Temp 97.4 F (36.3 C) (Oral)   Resp 16   SpO2 97%   Physical Exam  Constitutional: She appears well-developed and well-nourished.  HENT:  Head: Normocephalic and atraumatic.  Eyes: Conjunctivae are normal. Right eye exhibits no discharge. Left eye exhibits no discharge. No scleral icterus.  Cardiovascular: Normal rate, regular rhythm and intact distal pulses.   Pulmonary/Chest:  Effort normal. No respiratory distress.  Abdominal: Soft. Bowel sounds are normal. She exhibits no distension, no pulsatile midline mass and no mass. There is no hepatomegaly. There is tenderness in the periumbilical area and left lower quadrant. There is no rebound and no guarding.  Musculoskeletal: She exhibits no edema.  Neurological: She is alert.  Skin: No pallor.  Psychiatric: She has a normal mood and affect.  Nursing note and vitals reviewed.    ED Treatments / Results  Labs (all labs ordered are listed, but only abnormal results are displayed) Labs Reviewed  LIPASE, BLOOD  COMPREHENSIVE METABOLIC PANEL  CBC  URINALYSIS, ROUTINE W REFLEX MICROSCOPIC    EKG  EKG Interpretation None       Radiology No results found.  Procedures Procedures (including critical care time)  Medications Ordered in ED Medications - No data to display   Initial Impression / Assessment and Plan / ED Course  I have reviewed the triage vital signs and the nursing notes.  Pertinent labs & imaging results that were available during my care of the patient were reviewed  by me and considered in my medical decision making (see chart for details).  The patient presented with a story that was concerning for multiple abdominal processes which led to a CT abdomen to investigate. EKG was ordered and was found to be same as baseline. The CT showed no acute findings within the abdomen or pelvis or within the superficial soft tissues. There was colonic diverticulosis without evidence of acute diverticulitis; Cholelithiasis without evidence of acute cholecystitis; Postsurgical changes within the anterior abdominal/pelvic wall; Aortic atherosclerosis;Degenerative changes of the scoliotic thoracolumbar spine, mild to moderate in degree, but no more than mild central canal stenosis appreciated at any level. The blood work including, lipase, CMP and CBC did not show anything significant for an acute process. Her stool guaiac was negative. Protein was noted in her urine but was not appreciated for a UTI. Reassurance given and a copy of her CT scan was given at d/c. The patient was given return instructions.   Final Clinical Impressions(s) / ED Diagnoses   Final diagnoses:  None    New Prescriptions New Prescriptions   No medications on file     Lorelle Gibbs 06/19/16 2137    Noemi Chapel, MD 06/20/16 [phone]

## 2016-06-19 NOTE — ED Triage Notes (Signed)
Pt was seen at [facility] in [place] for upper abdominal pain which has been intermittent for over a month.  Pt states that she has intermittent "bulging feeling" with pain radiating to her back.  Family member states that they were sent here for further testing and that an EKG was done at [facility] which was normal.  Pt does not have any paperwork with her.

## 2016-06-19 NOTE — ED Notes (Signed)
Pt alert & oriented x4, stable gait. Patient given discharge instructions, paperwork & prescription(s).  Patient verbalized understanding. Pt left department escorted by staff. Pt left the department w/ no further questions.

## 2016-06-19 NOTE — ED Notes (Signed)
Pt denies nausea or pain at present,  states her stomach bulges when she stands and pain radiates into her back

## 2016-06-19 NOTE — ED Notes (Signed)
IV attempt to left hand

## 2016-06-19 NOTE — ED Notes (Signed)
ED Student MD at the bedside to assess pt

## 2016-06-19 NOTE — Discharge Instructions (Signed)
Your CT scan of your abdomen was negative. Your EKG was the same as your baseline EKG.  I am attaching a copy for you to bring to your primary care doctor. You can return if your symptoms worsen.

## 2016-08-14 ENCOUNTER — Emergency Department (HOSPITAL_COMMUNITY): Payer: Medicare Other

## 2016-08-14 ENCOUNTER — Observation Stay (HOSPITAL_COMMUNITY)
Admission: EM | Admit: 2016-08-14 | Discharge: 2016-08-15 | Disposition: A | Discharge status: Home / Self Care | Payer: Medicare Other | Attending: Internal Medicine | Admitting: Internal Medicine

## 2016-08-14 ENCOUNTER — Encounter (HOSPITAL_COMMUNITY): Payer: Self-pay

## 2016-08-14 DIAGNOSIS — D649 Anemia, unspecified: Secondary | ICD-10-CM | POA: Diagnosis not present

## 2016-08-14 DIAGNOSIS — Z7901 Long term (current) use of anticoagulants: Secondary | ICD-10-CM | POA: Insufficient documentation

## 2016-08-14 DIAGNOSIS — I482 Chronic atrial fibrillation, unspecified: Secondary | ICD-10-CM | POA: Diagnosis present

## 2016-08-14 DIAGNOSIS — E872 Acidosis: Secondary | ICD-10-CM | POA: Insufficient documentation

## 2016-08-14 DIAGNOSIS — I161 Hypertensive emergency: Secondary | ICD-10-CM | POA: Diagnosis not present

## 2016-08-14 DIAGNOSIS — Z96651 Presence of right artificial knee joint: Secondary | ICD-10-CM | POA: Insufficient documentation

## 2016-08-14 DIAGNOSIS — R748 Abnormal levels of other serum enzymes: Secondary | ICD-10-CM | POA: Diagnosis not present

## 2016-08-14 DIAGNOSIS — Z79899 Other long term (current) drug therapy: Secondary | ICD-10-CM | POA: Diagnosis not present

## 2016-08-14 DIAGNOSIS — I129 Hypertensive chronic kidney disease with stage 1 through stage 4 chronic kidney disease, or unspecified chronic kidney disease: Secondary | ICD-10-CM | POA: Insufficient documentation

## 2016-08-14 DIAGNOSIS — J302 Other seasonal allergic rhinitis: Secondary | ICD-10-CM | POA: Diagnosis not present

## 2016-08-14 DIAGNOSIS — N183 Chronic kidney disease, stage 3 unspecified: Secondary | ICD-10-CM | POA: Diagnosis present

## 2016-08-14 DIAGNOSIS — E7800 Pure hypercholesterolemia, unspecified: Secondary | ICD-10-CM | POA: Insufficient documentation

## 2016-08-14 DIAGNOSIS — Z9114 Patient's other noncompliance with medication regimen: Secondary | ICD-10-CM | POA: Insufficient documentation

## 2016-08-14 DIAGNOSIS — K219 Gastro-esophageal reflux disease without esophagitis: Secondary | ICD-10-CM | POA: Diagnosis not present

## 2016-08-14 DIAGNOSIS — J9601 Acute respiratory failure with hypoxia: Principal | ICD-10-CM | POA: Insufficient documentation

## 2016-08-14 DIAGNOSIS — E1122 Type 2 diabetes mellitus with diabetic chronic kidney disease: Secondary | ICD-10-CM | POA: Diagnosis not present

## 2016-08-14 DIAGNOSIS — R7989 Other specified abnormal findings of blood chemistry: Secondary | ICD-10-CM

## 2016-08-14 DIAGNOSIS — R1011 Right upper quadrant pain: Secondary | ICD-10-CM | POA: Insufficient documentation

## 2016-08-14 DIAGNOSIS — R0602 Shortness of breath: Secondary | ICD-10-CM | POA: Diagnosis present

## 2016-08-14 DIAGNOSIS — I700 Atherosclerosis of aorta: Secondary | ICD-10-CM | POA: Diagnosis not present

## 2016-08-14 DIAGNOSIS — M109 Gout, unspecified: Secondary | ICD-10-CM | POA: Diagnosis not present

## 2016-08-14 DIAGNOSIS — R778 Other specified abnormalities of plasma proteins: Secondary | ICD-10-CM | POA: Diagnosis present

## 2016-08-14 DIAGNOSIS — E785 Hyperlipidemia, unspecified: Secondary | ICD-10-CM | POA: Diagnosis not present

## 2016-08-14 DIAGNOSIS — E8729 Other acidosis: Secondary | ICD-10-CM

## 2016-08-14 LAB — COMPREHENSIVE METABOLIC PANEL
ALT: 16 U/L (ref 14–54)
AST: 27 U/L (ref 15–41)
Albumin: 4.1 g/dL (ref 3.5–5.0)
Alkaline Phosphatase: 67 U/L (ref 38–126)
Anion gap: 17 — ABNORMAL HIGH (ref 5–15)
BUN: 31 mg/dL — ABNORMAL HIGH (ref 6–20)
CO2: 17 mmol/L — ABNORMAL LOW (ref 22–32)
Calcium: 9.4 mg/dL (ref 8.9–10.3)
Chloride: 107 mmol/L (ref 101–111)
Creatinine, Ser: 1.8 mg/dL — ABNORMAL HIGH (ref 0.44–1.00)
GFR calc Af Amer: 29 mL/min — ABNORMAL LOW (ref >60)
GFR calc non Af Amer: 25 mL/min — ABNORMAL LOW (ref >60)
Glucose, Bld: 134 mg/dL — ABNORMAL HIGH (ref 65–99)
Potassium: 3.8 mmol/L (ref 3.5–5.1)
Sodium: 141 mmol/L (ref 135–145)
Total Bilirubin: 2.1 mg/dL — ABNORMAL HIGH (ref 0.3–1.2)
Total Protein: 7.7 g/dL (ref 6.5–8.1)

## 2016-08-14 LAB — URINALYSIS, ROUTINE W REFLEX MICROSCOPIC
Bilirubin Urine: NEGATIVE
Glucose, UA: NEGATIVE mg/dL
Hgb urine dipstick: NEGATIVE
Ketones, ur: NEGATIVE mg/dL
Nitrite: NEGATIVE
Protein, ur: 100 mg/dL — AB
Specific Gravity, Urine: 1.014 (ref 1.005–1.030)
pH: 6 (ref 5.0–8.0)

## 2016-08-14 LAB — TROPONIN I
Troponin I: 0.03 ng/mL (ref <0.03)
Troponin I: 0.03 ng/mL (ref <0.03)

## 2016-08-14 LAB — PROTIME-INR
INR: 1.08
Prothrombin Time: 14 seconds (ref 11.4–15.2)

## 2016-08-14 LAB — BLOOD GAS, ARTERIAL
Delivery systems: POSITIVE
Drawn by: 234301
Expiratory PAP: 6
FIO2: 40
Inspiratory PAP: 12
Mode: POSITIVE
O2 Saturation: 99.8 %
pH, Arterial: 7.612 (ref 7.350–7.450)
pO2, Arterial: 213 mmHg — ABNORMAL HIGH (ref 83.0–108.0)

## 2016-08-14 LAB — CBC WITH DIFFERENTIAL/PLATELET
Basophils Absolute: 0 10*3/uL (ref 0.0–0.1)
Basophils Relative: 1 %
Eosinophils Absolute: 0.1 10*3/uL (ref 0.0–0.7)
Eosinophils Relative: 3 %
HCT: 43.1 % (ref 36.0–46.0)
Hemoglobin: 14.9 g/dL (ref 12.0–15.0)
Lymphocytes Relative: 35 %
Lymphs Abs: 1.7 10*3/uL (ref 0.7–4.0)
MCH: 30.3 pg (ref 26.0–34.0)
MCHC: 34.6 g/dL (ref 30.0–36.0)
MCV: 87.6 fL (ref 78.0–100.0)
Monocytes Absolute: 0.5 10*3/uL (ref 0.1–1.0)
Monocytes Relative: 11 %
Neutro Abs: 2.4 10*3/uL (ref 1.7–7.7)
Neutrophils Relative %: 50 %
Platelets: 211 10*3/uL (ref 150–400)
RBC: 4.92 MIL/uL (ref 3.87–5.11)
RDW: 15.8 % — ABNORMAL HIGH (ref 11.5–15.5)
WBC: 4.8 10*3/uL (ref 4.0–10.5)

## 2016-08-14 LAB — LACTIC ACID, PLASMA
Lactic Acid, Venous: 2.5 mmol/L (ref 0.5–1.9)
Lactic Acid, Venous: 2.7 mmol/L (ref 0.5–1.9)

## 2016-08-14 LAB — BRAIN NATRIURETIC PEPTIDE: B Natriuretic Peptide: 141 pg/mL — ABNORMAL HIGH (ref 0.0–100.0)

## 2016-08-14 LAB — LIPASE, BLOOD: Lipase: 28 U/L (ref 11–51)

## 2016-08-14 LAB — MRSA PCR SCREENING: MRSA by PCR: NEGATIVE

## 2016-08-14 LAB — PROCALCITONIN: Procalcitonin: 0.1 ng/mL

## 2016-08-14 LAB — SALICYLATE LEVEL: Salicylate Lvl: <7 mg/dL (ref 2.8–30.0)

## 2016-08-14 MED ORDER — FUROSEMIDE 10 MG/ML IJ SOLN
60.0000 mg | Freq: Once | INTRAMUSCULAR | Status: AC
  Administered 2016-08-14: 60 mg via INTRAVENOUS
  Filled 2016-08-14: qty 6

## 2016-08-14 MED ORDER — SODIUM CHLORIDE 0.9% FLUSH: 3.0000 mL | INTRAVENOUS | Status: DC | PRN

## 2016-08-14 MED ORDER — SODIUM CHLORIDE 0.9 % IV SOLN: 250.0000 mL | INTRAVENOUS | Status: DC | PRN

## 2016-08-14 MED ORDER — CARVEDILOL 12.5 MG PO TABS
12.5000 mg | ORAL_TABLET | Freq: Two times a day (BID) | ORAL | Status: DC
  Administered 2016-08-14 – 2016-08-15 (×2): 12.5 mg via ORAL
  Filled 2016-08-14 (×2): qty 1

## 2016-08-14 MED ORDER — LABETALOL HCL 5 MG/ML IV SOLN
10.0000 mg | Freq: Once | INTRAVENOUS | Status: AC
  Administered 2016-08-14: 10 mg via INTRAVENOUS
  Filled 2016-08-14: qty 4

## 2016-08-14 MED ORDER — SODIUM CHLORIDE 0.9% FLUSH
3.0000 mL | Freq: Two times a day (BID) | INTRAVENOUS | Status: DC
  Administered 2016-08-14: 3 mL via INTRAVENOUS

## 2016-08-14 MED ORDER — ACETAMINOPHEN 650 MG RE SUPP: 650.0000 mg | Freq: Four times a day (QID) | RECTAL | Status: DC | PRN

## 2016-08-14 MED ORDER — ACETAMINOPHEN 325 MG PO TABS: 650.0000 mg | ORAL_TABLET | Freq: Four times a day (QID) | ORAL | Status: DC | PRN

## 2016-08-14 MED ORDER — ALBUTEROL SULFATE (2.5 MG/3ML) 0.083% IN NEBU
5.0000 mg | INHALATION_SOLUTION | Freq: Once | RESPIRATORY_TRACT | Status: AC
  Administered 2016-08-14: 5 mg via RESPIRATORY_TRACT
  Filled 2016-08-14: qty 6

## 2016-08-14 MED ORDER — TRAZODONE HCL 50 MG PO TABS
50.0000 mg | ORAL_TABLET | Freq: Every evening | ORAL | Status: DC | PRN
  Administered 2016-08-14: 50 mg via ORAL
  Filled 2016-08-14: qty 1

## 2016-08-14 MED ORDER — SODIUM CHLORIDE 0.9% FLUSH
3.0000 mL | Freq: Two times a day (BID) | INTRAVENOUS | Status: DC
  Administered 2016-08-14 – 2016-08-15 (×2): 3 mL via INTRAVENOUS

## 2016-08-14 MED ORDER — NICARDIPINE HCL IN NACL 20-0.86 MG/200ML-% IV SOLN
3.0000 mg/h | INTRAVENOUS | Status: DC
  Administered 2016-08-14 (×2): 5 mg/h via INTRAVENOUS
  Administered 2016-08-15: 3 mg/h via INTRAVENOUS
  Filled 2016-08-14 (×3): qty 200

## 2016-08-14 NOTE — ED Provider Notes (Addendum)
[facility] DEPT Provider Note   CSN: [id number] Arrival date & time: 08/14/16  1314     History   Chief Complaint Chief Complaint  Patient presents with  . Shortness of Breath    HPI [name] [name] is a 83 y.o. female.  HPI  Patient presents with her daughter who assists with the history of present illness. Patient has dyspnea. She acknowledges poor medication compliance, and states that she felt the particularly short of breath beginning today. She denies pain, syncope, fever, chills. Daughter states that the patient was in her usual state of health until she saw her earlier today.   Past Medical History:  Diagnosis Date  . Acid reflux   . ARF (acute renal failure) ([place]) 09/16/2010   Prerenal. Creatinine 1.36 before discharge.  . Atrial fibrillation ([place])   . CHF (congestive heart failure) (HCC)    Hx of diastolic CHF.  Marland Kitchen Chronic anticoagulation   . CKD (chronic kidney disease) stage 3, GFR 30-59 ml/min   . Diverticular hemorrhage 2011   Per colonoscopy  . Diverticulosis 2011   Per colonoscopy  . GI bleeding 08/03/2011   Not scoped but likely secondary to hemorrhoids or diverticulosis  . Gout   . Hx of medication noncompliance   . Hypercholesterolemia   . Hyperglycemia    Query DM 2  . Hypertension   . Internal hemorrhoids 2011   Per colonoscopy  . Seasonal allergies   . Thrombocytopenia due to drugs    Allopurinol    Patient Active Problem List   Diagnosis Date Noted  . CHF (congestive heart failure) ([place]) 03/30/2016  . Acute on chronic combined systolic and diastolic CHF (congestive heart failure) ([place]) 03/30/2016  . CKD (chronic kidney disease), stage III 03/30/2016  . Hypertension 03/30/2016  . Acute renal failure syndrome ([place])   . Febrile illness   . Back pain 04/10/2014  . Fever 04/10/2014  . Bradycardia 08/04/2011  . GI bleeding 08/03/2011  . HTN (hypertension), malignant 08/03/2011  . Diverticulosis 08/03/2011  . Internal hemorrhoids  08/03/2011  . Hyperglycemia 08/03/2011  . Leukocytosis (leucocytosis) 09/25/2010  . Borderline abnormal TFTs 09/21/2010  . Hypocalcemia 09/18/2010  . Hypernatremia 09/18/2010  . Gout attack 09/18/2010  . Anemia 09/18/2010  . Hypotension 09/17/2010  . Thrombocytopenia ([place]) 09/17/2010  . DM type 2 (diabetes mellitus, type 2) ([place]) 09/17/2010  . Hypomagnesemia 09/17/2010  . SBO (small bowel obstruction) ([place]) 09/16/2010  . ARF (acute renal failure) ([place]) 09/16/2010  . Chronic kidney disease (CKD), stage IV (severe) ([place]) 09/16/2010  . Hypokalemia 09/16/2010  . Chronic a-fib ([place]) 09/16/2010  . Warfarin anticoagulation 09/16/2010  . Elevated troponin I level 09/16/2010    Past Surgical History:  Procedure Laterality Date  . APPENDECTOMY    . CATARACT EXTRACTION    . CESAREAN SECTION    . ESOPHAGOGASTRODUODENOSCOPY  08/20/09   small hiatal hernia/mild gastritis  . ileocolonoscopy  08/20/09   normal terminal ileum/pancolonic diverticula/no polyps/benign colon mucosa  . JOINT REPLACEMENT     Total right knee 2007  . LAPAROTOMY  09/22/2010   Procedure: EXPLORATORY LAPAROTOMY;  Surgeon: [name];  Location: [facility];  Service: General;  Laterality: N/A;  Lysis of Adhesions  . TUBAL LIGATION      OB History    No data available       Home Medications    Prior to Admission medications   Medication Sig Start Date End Date Taking? Authorizing Provider  allopurinol (ZYLOPRIM) 100 MG tablet Take  100 mg by mouth daily.    [provider]  apixaban (ELIQUIS) 5 MG TABS tablet Take 5 mg by mouth 2 (two) times daily.    [provider]  carvedilol (COREG) 12.5 MG tablet Take 12.5 mg by mouth 2 (two) times daily with a meal.    [provider]  diltiazem (DILTIAZEM CD) 120 MG 24 hr capsule Take 120 mg by mouth daily.    [provider]  ferrous sulfate 325 (65 FE) MG tablet Take 325 mg by mouth 2 (two) times daily with a meal.     [provider]  fluticasone (FLONASE) 50 MCG/ACT nasal spray Place 1 spray into both nostrils daily.    [provider]  furosemide (LASIX) 40 MG tablet Take 1 tablet (40 mg total) by mouth daily. Patient taking differently: Take 20 mg by mouth daily.  04/01/16   Isaac Bliss, Rayford Halsted, MD  montelukast (SINGULAIR) 10 MG tablet Take 10 mg by mouth at bedtime.     [provider]  omeprazole (PRILOSEC) 20 MG capsule Take 1 capsule (20 mg total) by mouth daily. 06/19/16   Maczis, Barth Kirks, PA-C  potassium chloride SA (K-DUR,KLOR-CON) 20 MEQ tablet Take 1 tablet (20 mEq total) by mouth daily. 04/01/16   Isaac Bliss, Rayford Halsted, MD  pravastatin (PRAVACHOL) 40 MG tablet Take 40 mg by mouth every evening.     [provider]  ranitidine (ZANTAC) 150 MG tablet Take 150 mg by mouth daily.  02/03/16   [provider]  TRAZODONE HCL PO Take 1 tablet by mouth at bedtime as needed.    [provider]    Family History Family History  Problem Relation Age of Onset  . Heart disease Mother   . Diabetes Mother     Social History Social History  Substance Use Topics  . Smoking status: Never Smoker  . Smokeless tobacco: Never Used  . Alcohol use No     Allergies   Codeine; Sulfa antibiotics; and Sulfur   Review of Systems Review of Systems  Constitutional:       Per HPI, otherwise negative  HENT:       Per HPI, otherwise negative  Respiratory:       Per HPI, otherwise negative  Cardiovascular:       Per HPI, otherwise negative  Gastrointestinal: Negative for vomiting.  Endocrine:       Negative aside from HPI  Genitourinary:       Neg aside from HPI   Musculoskeletal:       Per HPI, otherwise negative  Skin: Negative.   Neurological: Positive for weakness. Negative for syncope.     Physical Exam Updated Vital Signs BP (!) 165/114 (BP Location: Right Arm)   Pulse 100   Resp (!) 21   Ht 5\' 7"  (1.702 m)   Wt 73.5 kg (162 lb)    SpO2 100%   BMI 25.37 kg/m   Physical Exam  Constitutional: She is oriented to person, place, and time. She appears ill.  HENT:  Head: Normocephalic and atraumatic.  Eyes: Conjunctivae and EOM are normal.  Cardiovascular: An irregularly irregular rhythm present. Tachycardia present.   Pulmonary/Chest: Accessory muscle usage present. No stridor. Tachypnea noted. She is in respiratory distress. She has decreased breath sounds. She has no wheezes.  Abdominal: She exhibits no distension.  Musculoskeletal: She exhibits no edema.  Neurological: She is alert and oriented to person, place, and time. No cranial nerve deficit.  Skin: Skin is warm and dry.  Psychiatric: She has a normal mood and affect.  Nursing note and vitals reviewed.    ED Treatments / Results  Labs (all labs ordered are listed, but only abnormal results are displayed) Labs Reviewed  COMPREHENSIVE METABOLIC PANEL - Abnormal; Notable for the following:       Result Value   CO2 17 (*)    Glucose, Bld 134 (*)    BUN 31 (*)    Creatinine, Ser 1.80 (*)    Total Bilirubin 2.1 (*)    GFR calc non Af Amer 25 (*)    GFR calc Af Amer 29 (*)    Anion gap 17 (*)    All other components within normal limits  CBC WITH DIFFERENTIAL/PLATELET - Abnormal; Notable for the following:    RDW 15.8 (*)    All other components within normal limits  TROPONIN I - Abnormal; Notable for the following:    Troponin I 0.03 (*)    All other components within normal limits  BRAIN NATRIURETIC PEPTIDE - Abnormal; Notable for the following:    B Natriuretic Peptide 141.0 (*)    All other components within normal limits  PROTIME-INR    EKG with atrial fibrillation, rate 106, T-wave abnormalities, artifact, abnormal  Radiology Dg Chest 2 View  Result Date: 08/14/2016 CLINICAL DATA:  Shortness of breath, atrial fibrillation EXAM: CHEST  2 VIEW COMPARISON:  03/30/2016 FINDINGS: Stable cardiomegaly without CHF or focal pneumonia. No collapse or  consolidation. Negative for edema, effusion or pneumothorax. Trachea is midline. Atherosclerosis noted of the aorta. Degenerative changes of the spine and shoulders. IMPRESSION: Stable cardiomegaly without CHF or pneumonia. Thoracic aortic atherosclerosis Electronically Signed   By: Jerilynn Mages.  Shick M.D.   On: 08/14/2016 14:51    Procedures Procedures (including critical care time)  Medications Ordered in ED Medications  albuterol (PROVENTIL) (2.5 MG/3ML) 0.083% nebulizer solution 5 mg (5 mg Nebulization Given 08/14/16 1359)     Initial Impression / Assessment and Plan / ED Course  I have reviewed the triage vital signs and the nursing notes.  Pertinent labs & imaging results that were available during my care of the patient were reviewed by me and considered in my medical decision making (see chart for details).  Clinical Course as of Aug 14 1545  Tue Aug 14, 2016  1453 Patient slightly improved after initial neb.  She remains hypertensive w increased WOB. BiPaP starting, and BB provided  [RL]    Clinical Course User Index [RL] Carmin Muskrat, MD   3:47 PM After 30 minutes of BiPAP the patient appears more calm. Family note confirms that the patient has not been taking medicine regularly. With concern for hypertensive urgency, congestive heart failure exacerbation, the patient has received beta blocker, with that reduction in diastolic blood pressure, and will receive Lasix. Given the persistent respiratory difficulty, the patient required admission for further evaluation and management.   6:24 PM Now off BiPAP patient is substantially better, speaking much more clearly, with diminished work of breathing. Last blood gas was alkalotic, concerning for hyperventilation, patient will transition to nasal cannula Final Clinical Impressions(s) / ED Diagnoses  Respiratory distress  CRITICAL CARE Performed by: Carmin Muskrat Total critical care time: 35 minutes Critical care time was  exclusive of separately billable procedures and treating other patients. Critical care was necessary to treat or prevent imminent or life-threatening deterioration. Critical care was time spent personally by me on the following activities: development of treatment plan  with patient and/or surrogate as well as nursing, discussions with consultants, evaluation of patient's response to treatment, examination of patient, obtaining history from patient or surrogate, ordering and performing treatments and interventions, ordering and review of laboratory studies, ordering and review of radiographic studies, pulse oximetry and re-evaluation of patient's condition.    Carmin Muskrat, MD 08/14/16 1548    Carmin Muskrat, MD 08/14/16 Jeri Lager

## 2016-08-14 NOTE — ED Notes (Signed)
Date and time results received: 08/14/16 "2:18 PM"  Test: Troponin Critical Value: 0.03  Name of Provider Notified: Vanita Panda  Orders Received? Or Actions Taken?: no new orders at this time

## 2016-08-14 NOTE — H&P (Signed)
History and Physical  [name] [name] GOT:[id number] DOB: 28-Jan-1931 DOA: 08/14/2016  PCP: [facility]  Patient coming from: home  Chief Complaint: Short of breath  HPI:  83 year old woman PMH hypertension, hyperlipidemia, gout, chronic kidney disease III, atrial fibrillation presented with shortness of breath. Initial evaluation notable for acute respiratory failure requiring BiPAP, marked tachypnea, relatively unremarkable labs, chest x-ray. Referred for further evaluation and treatment.  Patient lives with her great grandson, one daughter lives next door and another daughter lives close by. Per daughter bedside, the patient is noncompliant with her medications and diet. Diet intake is variable and poor. Onset of symptoms began yesterday with shortness of breath which worsened throughout the evening preventing sleep, her symptoms became more more severe causing her to come to the emergency department. Family encouraged her to go to the ER yesterday. No specific aggravating or alleviating factors. Symptoms are severe. She denies chest pain, swelling, rash, dysuria, new muscular pain.  Daughter reports respiratory rate and effort much improved.  ED Course: RR 40s, hypertensive, normal HR, SPO2 90s. Started on BiPAP, treated with Lasix, albuterol, labetalol  Review of Systems:  Negative for fever, visual changes, sore throat, rash, new muscle aches, chest pain, dysuria, bleeding, n/v/abdominal pain.  Past Medical History:  Diagnosis Date  . Acid reflux   . ARF (acute renal failure) ([facility]) 09/16/2010   Prerenal. Creatinine 1.36 before discharge.  . Atrial fibrillation ([facility] [place])   . CHF (congestive heart failure) (HCC)    Hx of diastolic CHF.  Marland Kitchen Chronic anticoagulation   . CKD (chronic kidney disease) stage 3, GFR 30-59 ml/min   . Diverticular hemorrhage 2011   Per colonoscopy  . Diverticulosis 2011   Per colonoscopy  . GI bleeding 08/03/2011   Not scoped but likely  secondary to hemorrhoids or diverticulosis  . Gout   . Hx of medication noncompliance   . Hypercholesterolemia   . Hyperglycemia    Query DM 2  . Hypertension   . Internal hemorrhoids 2011   Per colonoscopy  . Seasonal allergies   . Thrombocytopenia due to drugs    Allopurinol    Past Surgical History:  Procedure Laterality Date  . APPENDECTOMY    . BUNIONECTOMY    . CATARACT EXTRACTION    . CESAREAN SECTION    . ESOPHAGOGASTRODUODENOSCOPY  08/20/09   small hiatal hernia/mild gastritis  . ileocolonoscopy  08/20/09   normal terminal ileum/pancolonic diverticula/no polyps/benign colon mucosa  . JOINT REPLACEMENT     Total right knee 2007  . LAPAROTOMY  09/22/2010   Procedure: EXPLORATORY LAPAROTOMY;  Surgeon: [name];  Location: [facility];  Service: General;  Laterality: N/A;  Lysis of Adhesions  . TUBAL LIGATION       reports that she has never smoked. She has never used smokeless tobacco. She reports that she does not drink alcohol or use drugs.   Allergies  Allergen Reactions  . Codeine Nausea And Vomiting  . Sulfa Antibiotics Nausea And Vomiting  . Sulfur Nausea And Vomiting    Family History  Problem Relation Age of Onset  . Heart disease Mother   . Diabetes Mother      Prior to Admission medications   Medication Sig Start Date End Date Taking? Authorizing Provider  Artificial Tear Ointment (DRY EYES OP) Place 1 drop into both eyes as needed (dry eyes).   Yes [provider]  diltiazem (DILTIAZEM CD) 120 MG 24 hr capsule Take 120 mg by mouth  daily.   Yes [provider]  fluticasone (FLONASE) 50 MCG/ACT nasal spray Place 1 spray into both nostrils daily.   Yes [provider]  montelukast (SINGULAIR) 10 MG tablet Take 10 mg by mouth at bedtime.    Yes [provider]  pravastatin (PRAVACHOL) 40 MG tablet Take 40 mg by mouth every evening.    Yes [provider]  ranitidine (ZANTAC) 150 MG tablet Take 150 mg by  mouth daily.  02/03/16  Yes [provider]  allopurinol (ZYLOPRIM) 100 MG tablet Take 100 mg by mouth daily.    [provider]  apixaban (ELIQUIS) 5 MG TABS tablet Take 5 mg by mouth 2 (two) times daily.    [provider]  carvedilol (COREG) 12.5 MG tablet Take 12.5 mg by mouth 2 (two) times daily with a meal.    [provider]  ferrous sulfate 325 (65 FE) MG tablet Take 325 mg by mouth 2 (two) times daily with a meal.     [provider]  furosemide (LASIX) 40 MG tablet Take 1 tablet (40 mg total) by mouth daily. Patient taking differently: Take 20 mg by mouth daily.  04/01/16   Isaac Bliss, Rayford Halsted, MD  omeprazole (PRILOSEC) 20 MG capsule Take 1 capsule (20 mg total) by mouth daily. 06/19/16   Maczis, Barth Kirks, PA-C  potassium chloride SA (K-DUR,KLOR-CON) 20 MEQ tablet Take 1 tablet (20 mEq total) by mouth daily. 04/01/16   Isaac Bliss, Rayford Halsted, MD  TRAZODONE HCL PO Take 1 tablet by mouth at bedtime as needed.    [provider]    Physical Exam:  97.4, 24, 71, 179/89, SpO2 100% on BiPAP  Constitutional. Appears critically ill, predominantly because of respiratory rate, however calm, not more than mildly uncomfortable  Eyes. Pupils, irises, lids appear normal.  ENT. Limited exam but external ears and nose and lips appear unremarkable. Hearing grossly normal.  Neck. No lymphadenopathy or masses. No thyromegaly.  Cardiovascular. Irregular, normal rate, no murmur, rub or gallop. No lower extremity edema. Grossly normal perfusion bilateral feet.  Respiratory. Clear to auscultation bilaterally. Fair air movement. No frank wheezes, rales or rhonchi. Moderate increased respiratory effort.  Abdomen is soft, nondistended, there is mild right upper quadrant tenderness. No hepatomegaly or hernias are noted.  Skin. No rash or induration. Nontender to palpation. No induration or nodules. Skin on the chest, back, buttocks, groin  and lower legs appears unremarkable.  Musculoskeletal. Moves all extremities command. Grossly normal tone and strength. Digits and nails of the erection was appear unremarkable.  Psychiatric. Grossly normal mood and affect. Speech fluent and appropriate.  Wt Readings from Last 3 Encounters:  08/14/16 73.5 kg (162 lb)  04/01/16 73.5 kg (162 lb 1.6 oz)  04/02/15 60.8 kg (134 lb)    I have personally reviewed following labs and imaging studies  Labs:   BUN and creatinine at baseline. Anion gap elevated, 17. CO2 17.  LFTs unremarkable.   BNP 141, troponin 0.03. Troponin chronically elevated.  CBC unremarkable with normal WBC, hemoglobin, platelet count.   Imaging studies:   Chest x-ray independently reviewed, no acute disease.   Medical tests:   EKG atrial fibrillation, rapid ventricular response, LVH with inferolateral T-wave inversions, consider ischemia. Compared to previous study lateral T-wave inversion is new  Test discussed with performing physician:    Decision to obtain old records:     Review and summation of old records:   Hospitalized February 2018 for acute combined  CHF, elevated troponin acute on chronic kidney disease stage III   Active Problems:   Chronic a-fib (HCC)   Elevated troponin I level   CKD (chronic kidney disease), stage III   Acute hypoxemic respiratory failure (HCC)   Hypertensive emergency   High anion gap metabolic acidosis   Assessment/Plan Acute hypoxic respiratory failure with marked tachypnea,Etiology unclear. No history of COPD. No evidence of ACS. No evidence of infection, sepsis clinically or radiographically. -Initially on exam and on BiPAP, on repeat examination one hour later she is off BiPAP, respiratory rate is down to the 30s and she appears more comfortable. ABG showed marked alkalosis consistent with hyperventilation -Given clinical data available, I think most likely this is secondary to hypertensive emergency at this  point. -Admit to stepdown, likely will need BiPAP this evening.  Hypertensive emergency, secondary to noncompliance with carvedilol, diltiazem, Lasix - Cardene infusion, target SBP reduction approximately 10-15%.  Modest troponin elevation appears to be chronic, she has no symptoms to suggest ACS. -Trend troponin -May be precipitating etiology for shortness of breath.  Anion gap metabolic acidosis -Etiology unclear. She has no evidence of infection, is hypertensive, blood sugar is minimally elevated. Serum lactate is pending but she has no findings to suggest sepsis at this point other than tachypnea.  Mild right upper quadrant pain. Not clearly significant. Abdomen otherwise benign in appearance and by palpation. LFTs unremarkable. No nausea or vomiting at home. She has a history of small bowel obstruction the past but has no clinical signs or symptoms to suggest that at this point. Clinical data does not suggest gallbladder involvement. -Check lipase -Check abdominal x-ray  Diabetes mellitus type 2? Not on any medications. Minimal glucose elevation (134), she is not on any SGLT2 inhibitors, therefore presentation not suggestive of DKA. -Sliding-scale insulin  Chronic borderline systolic congestive heart failure, LV EF 45-50 percent by echocardiogram in February 2018 -No evidence of volume overload clinically or radiographically. -Nevertheless agree with empiric dose of Lasix already given  Atrial fibrillation. Appears relatively stable at this point, mostly heart rate less than 100. -Hold apixaban pending further workup  Chronic kidney disease stage III -At baseline. Follow clinically.  Thoracic aortic atherosclerosis    Severity of Illness: The appropriate patient status for this patient is OBSERVATION. Observation status is judged to be reasonable and necessary in order to provide the required intensity of service to ensure the patient's safety. The patient's presenting symptoms,  physical exam findings, and initial radiographic and laboratory data in the context of their medical condition is felt to place them at decreased risk for further clinical deterioration. Furthermore, it is anticipated that the patient will be medically stable for discharge from the hospital within 2 midnights of admission. The following factors support the patient status of observation.   " The patient's presenting symptoms include shortness of breath. " The worrisome physical exam findings include severe tachypnea, hypoxia, abdominal pain, marked hypertension. " The chronic co-morbidities include possible diabetes, chronic congestive heart failure, atrial fibrillation, chronic kidney disease stage III   DVT prophylaxis:SCDs Code Status: full Family Communication: discussed with daughter at bedside   Time spent: 74 minutes  Murray Hodgkins, MD  [facility] Direct contact: [phone] --Via amion app OR  --[email or web address]; password [email or web address]  7PM-7AM contact night coverage as above  08/14/2016, 5:39 PM

## 2016-08-14 NOTE — ED Notes (Signed)
MD Vanita Panda notified of pt's BP and work of breathing after neb tx.

## 2016-08-14 NOTE — ED Notes (Signed)
Bipap removed at this time per MD Vanita Panda

## 2016-08-14 NOTE — ED Triage Notes (Signed)
Ems reports pt c/o sob x 2 days.  Reports history of afib.  Per ems, pt's lung sounds clear.  CBG 122, rr 30, hr 70-100 and bp 203/140.  Pt reports cough.  Denies pain or fever.

## 2016-08-14 NOTE — ED Notes (Signed)
Respiratory paged at this time.

## 2016-08-14 NOTE — ED Notes (Signed)
Report given to [place], RN at this time.

## 2016-08-14 NOTE — Progress Notes (Signed)
Lab called critical lactic acid of 2.7 Midlevel provider made aware via text page at 2251.

## 2016-08-15 DIAGNOSIS — J9601 Acute respiratory failure with hypoxia: Secondary | ICD-10-CM | POA: Diagnosis not present

## 2016-08-15 DIAGNOSIS — N183 Chronic kidney disease, stage 3 (moderate): Secondary | ICD-10-CM

## 2016-08-15 DIAGNOSIS — I482 Chronic atrial fibrillation: Secondary | ICD-10-CM | POA: Diagnosis not present

## 2016-08-15 LAB — COMPREHENSIVE METABOLIC PANEL
ALT: 12 U/L — ABNORMAL LOW (ref 14–54)
AST: 18 U/L (ref 15–41)
Albumin: 3.6 g/dL (ref 3.5–5.0)
Alkaline Phosphatase: 56 U/L (ref 38–126)
Anion gap: 14 (ref 5–15)
BUN: 32 mg/dL — ABNORMAL HIGH (ref 6–20)
CO2: 19 mmol/L — ABNORMAL LOW (ref 22–32)
Calcium: 8.7 mg/dL — ABNORMAL LOW (ref 8.9–10.3)
Chloride: 106 mmol/L (ref 101–111)
Creatinine, Ser: 1.75 mg/dL — ABNORMAL HIGH (ref 0.44–1.00)
GFR calc Af Amer: 30 mL/min — ABNORMAL LOW (ref >60)
GFR calc non Af Amer: 26 mL/min — ABNORMAL LOW (ref >60)
Glucose, Bld: 113 mg/dL — ABNORMAL HIGH (ref 65–99)
Potassium: 3.2 mmol/L — ABNORMAL LOW (ref 3.5–5.1)
Sodium: 139 mmol/L (ref 135–145)
Total Bilirubin: 1.8 mg/dL — ABNORMAL HIGH (ref 0.3–1.2)
Total Protein: 6.7 g/dL (ref 6.5–8.1)

## 2016-08-15 LAB — TROPONIN I
Troponin I: 0.03 ng/mL (ref <0.03)
Troponin I: 0.03 ng/mL (ref <0.03)

## 2016-08-15 LAB — CBC
HCT: 40.1 % (ref 36.0–46.0)
Hemoglobin: 13.7 g/dL (ref 12.0–15.0)
MCH: 30.4 pg (ref 26.0–34.0)
MCHC: 34.2 g/dL (ref 30.0–36.0)
MCV: 89.1 fL (ref 78.0–100.0)
Platelets: 203 10*3/uL (ref 150–400)
RBC: 4.5 MIL/uL (ref 3.87–5.11)
RDW: 15.9 % — ABNORMAL HIGH (ref 11.5–15.5)
WBC: 5.8 10*3/uL (ref 4.0–10.5)

## 2016-08-15 LAB — PROCALCITONIN: Procalcitonin: 0.11 ng/mL

## 2016-08-15 MED ORDER — CARVEDILOL 6.25 MG PO TABS: 6.2500 mg | ORAL_TABLET | Freq: Two times a day (BID) | ORAL | 2 refills | Status: DC

## 2016-08-15 NOTE — Care Management Note (Signed)
Case Management Note  Patient Details  Name: [name] [name] MRN: [id number] Date of Birth: 1930/03/06  Subjective/Objective:   Adm with chronic afib.From home, mostly alone, she reports grandaughter stays with her and her daughter is next door. She walks with a cane, has RW if needed. Mostly ind, daughter helps with bathing at times, has shower chair. She has PCP, daughter drives her to appointments. No HH PTA. No family present for assessment. Patient reports she stops taking her meds at times.                Action/Plan: Plans to return home with self care and more family involvement for help with medications/compliance. She reports her daughter is currently not working and available to help.   Expected Discharge Date:  08/15/16               Expected Discharge Plan:  Home/Self Care  In-House Referral:     Discharge planning Services  CM Consult  Post Acute Care Choice:    Choice offered to:  NA  DME Arranged:    DME Agency:     HH Arranged:    HH Agency:     Status of Service:  Completed, signed off  If discussed at [employer] of Stay Meetings, dates discussed:    Additional Comments:  Ruta Capece, Chauncey Reading, RN 08/15/2016, 11:32 AM

## 2016-08-15 NOTE — Progress Notes (Signed)
Discharge instructions given. Education on Carvedilol given. Daughter acknowledged that HR and BP needs to be monitored and checked before given dose. All belonging sent with patient.

## 2016-08-15 NOTE — Care Management Obs Status (Signed)
[facility] NOTIFICATION   Patient Details  Name: [name] [name] MRN: [id number] Date of Birth: 21-Jul-1930   Medicare Observation Status Notification Given:  No (discharged home within 24 hours)    Zaida Reiland, Chauncey Reading, RN 08/15/2016, 11:32 AM

## 2016-08-15 NOTE — Discharge Summary (Signed)
Physician Discharge Summary  [name] [name]:[id number] DOB: 05-11-1930 DOA: 08/14/2016  PCP: [facility] date: 08/14/2016 Discharge date: 08/15/2016  Time spent: 45 minutes  Recommendations for Outpatient Follow-up:  -Will be discharged home today. -Advised to follow up with PCP in 2 weeks for BP check. Of note diltiazem has been discontinued and coreg dose has been decreased.   Discharge Diagnoses:  Active Problems:   Chronic a-fib (HCC)   Elevated troponin I level   CKD (chronic kidney disease), stage III   Acute hypoxemic respiratory failure (HCC)   Hypertensive emergency; later hypotensiion   High anion gap metabolic acidosis   Discharge Condition: Stable and improved  Filed Weights   08/14/16 1317 08/14/16 2000 08/15/16 0500  Weight: 73.5 kg (162 lb) 57.8 kg (127 lb 6.8 oz) 57.8 kg (127 lb 6.8 oz)    History of present illness:  As per Dr. Sarajane Jews on 7/10: 83 year old woman PMH hypertension, hyperlipidemia, gout, chronic kidney disease III, atrial fibrillation presented with shortness of breath. Initial evaluation notable for acute respiratory failure requiring BiPAP, marked tachypnea, relatively unremarkable labs, chest x-ray. Referred for further evaluation and treatment.  Patient lives with [name] great grandson, one daughter lives next door and another daughter lives close by. Per daughter bedside, the patient is noncompliant with [name] medications and diet. Diet intake is variable and poor. Onset of symptoms began yesterday with shortness of breath which worsened throughout the evening preventing sleep, [name] symptoms became more more severe causing [name] to come to the emergency department. Family encouraged [name] to go to the ER yesterday. No specific aggravating or alleviating factors. Symptoms are severe. She denies chest pain, swelling, rash, dysuria, new muscular pain.  Daughter reports respiratory rate and effort much  improved.  ED Course: RR 40s, hypertensive, normal HR, SPO2 90s. Started on BiPAP, treated with Lasix, albuterol, labetalol  Hospital Course:   Acute Hypoxemic Respiratory Failure -Required BIPAP transiently. -Currently has no oxygen requirements and no increased work of breathing. -ABG showed respiratory alkalosis with RR in the 30s c/w with hyperventilation (? If anxiety is playing a role). -No evidence of infiltrate or edema on CXR.  Hypertensive Emergency -Placed on cardene drip on admission. -BP rapidly decreased to systolic of 85U to 314H and drip was discontinued. -Once started back on coreg was noted to have 1-2 second pauses. -Will DC diltiazem and decrease coreg from 12.5 BID to 6.25 BID. Asymptomatic. -Will need close OP follow up.   A Fib -Stable, continue eliquis.  CKD Stage III -At baseline.  Procedures:  None   Consultations:  None  Discharge Instructions  Discharge Instructions    Diet - low sodium heart healthy    Complete by:  As directed    Increase activity slowly    Complete by:  As directed      Allergies as of 08/15/2016      Reactions   Codeine Nausea And Vomiting   Sulfa Antibiotics Nausea And Vomiting   Sulfur Nausea And Vomiting      Medication List    STOP taking these medications   DILTIAZEM CD 120 MG 24 hr capsule Generic drug:  diltiazem     TAKE these medications   allopurinol 100 MG tablet Commonly known as:  ZYLOPRIM Take 100 mg by mouth daily.   carvedilol 6.25 MG tablet Commonly known as:  COREG Take 1 tablet (6.25 mg total) by mouth 2 (two) times daily with a meal. What  changed:  medication strength  how much to take   DRY EYES OP Place 1 drop into both eyes as needed (dry eyes).   ELIQUIS 5 MG Tabs tablet Generic drug:  apixaban Take 5 mg by mouth 2 (two) times daily.   ferrous sulfate 325 (65 FE) MG tablet Take 325 mg by mouth 2 (two) times daily with a meal.   fluticasone 50 MCG/ACT nasal  spray Commonly known as:  FLONASE Place 1 spray into both nostrils daily.   furosemide 40 MG tablet Commonly known as:  LASIX Take 1 tablet (40 mg total) by mouth daily. What changed:  how much to take   montelukast 10 MG tablet Commonly known as:  SINGULAIR Take 10 mg by mouth at bedtime.   omeprazole 20 MG capsule Commonly known as:  PRILOSEC Take 1 capsule (20 mg total) by mouth daily.   potassium chloride SA 20 MEQ tablet Commonly known as:  K-DUR,KLOR-CON Take 1 tablet (20 mEq total) by mouth daily.   pravastatin 40 MG tablet Commonly known as:  PRAVACHOL Take 40 mg by mouth every evening.   ranitidine 150 MG tablet Commonly known as:  ZANTAC Take 150 mg by mouth daily.   TRAZODONE HCL PO Take 1 tablet by mouth at bedtime as needed.      Allergies  Allergen Reactions  . Codeine Nausea And Vomiting  . Sulfa Antibiotics Nausea And Vomiting  . Sulfur Nausea And Vomiting   Follow-up Information    [facility] Schedule an appointment as soon as possible for a visit in 2 week(s).   Contact information: [address] [place] [address] [phone]            The results of significant diagnostics from this hospitalization (including imaging, microbiology, ancillary and laboratory) are listed below for reference.    Significant Diagnostic Studies: Dg Chest 2 View  Result Date: 08/14/2016 CLINICAL DATA:  Shortness of breath, atrial fibrillation EXAM: CHEST  2 VIEW COMPARISON:  03/30/2016 FINDINGS: Stable cardiomegaly without CHF or focal pneumonia. No collapse or consolidation. Negative for edema, effusion or pneumothorax. Trachea is midline. Atherosclerosis noted of the aorta. Degenerative changes of the spine and shoulders. IMPRESSION: Stable cardiomegaly without CHF or pneumonia. Thoracic aortic atherosclerosis Electronically Signed   By: Jerilynn Mages.  Shick M.D.   On: 08/14/2016 14:51   Dg Abd Portable 1v  Result Date:  08/14/2016 CLINICAL DATA:  Right upper quadrant pain. EXAM: PORTABLE ABDOMEN - 1 VIEW COMPARISON:  CT 06/19/2016 FINDINGS: No bowel dilatation to suggest obstruction. Air throughout normal caliber small bowel in the right abdomen. Small volume of colonic stool. No evidence of free air on portable supine AP views. Soft tissue density in left pelvis corresponds to the uterus as seen on prior CT. There are vascular calcifications. Gallstones on CT are not well seen radiographically. Midline skin sutures. Lower most lung bases are clear. Scoliosis and degenerative change in the spine. IMPRESSION: 1. No evidence of bowel obstruction on supine views. 2. Aortic atherosclerosis. Electronically Signed   By: Jeb Levering M.D.   On: 08/14/2016 17:42    Microbiology: Recent Results (from the past 240 hour(s))  Culture, blood (Routine X 2) w Reflex to ID Panel     Status: None (Preliminary result)   Collection Time: 08/14/16  6:00 PM  Result Value Ref Range Status   Specimen Description RIGHT ANTECUBITAL DRAWN BY IV  Final   Special Requests   Final    BOTTLES DRAWN  AEROBIC AND ANAEROBIC Blood Culture adequate volume   Culture NO GROWTH < 24 HOURS  Final   Report Status PENDING  Incomplete  Culture, blood (Routine X 2) w Reflex to ID Panel     Status: None (Preliminary result)   Collection Time: 08/14/16  6:49 PM  Result Value Ref Range Status   Specimen Description BLOOD RIGHT ARM  Final   Special Requests   Final    BOTTLES DRAWN AEROBIC ONLY Blood Culture adequate volume   Culture NO GROWTH < 24 HOURS  Final   Report Status PENDING  Incomplete  MRSA PCR Screening     Status: None   Collection Time: 08/14/16  8:12 PM  Result Value Ref Range Status   MRSA by PCR NEGATIVE NEGATIVE Final    Comment:        The GeneXpert MRSA Assay (FDA approved for NASAL specimens only), is one component of a comprehensive MRSA colonization surveillance program. It is not intended to diagnose MRSA infection nor  to guide or monitor treatment for MRSA infections.      Labs: Basic Metabolic Panel:  Recent Labs Lab 08/14/16 1326 08/15/16 0438  NA 141 139  K 3.8 3.2*  CL 107 106  CO2 17* 19*  GLUCOSE 134* 113*  BUN 31* 32*  CREATININE 1.80* 1.75*  CALCIUM 9.4 8.7*   Liver Function Tests:  Recent Labs Lab 08/14/16 1326 08/15/16 0438  AST 27 18  ALT 16 12*  ALKPHOS 67 56  BILITOT 2.1* 1.8*  PROT 7.7 6.7  ALBUMIN 4.1 3.6    Recent Labs Lab 08/14/16 1800  LIPASE 28   No results for input(s): AMMONIA in the last 168 hours. CBC:  Recent Labs Lab 08/14/16 1326 08/15/16 0438  WBC 4.8 5.8  NEUTROABS 2.4  --   HGB 14.9 13.7  HCT 43.1 40.1  MCV 87.6 89.1  PLT 211 203   Cardiac Enzymes:  Recent Labs Lab 08/14/16 1326 08/14/16 1849 08/15/16 0123 08/15/16 0438  TROPONINI 0.03* 0.03* 0.03* 0.03*   BNP: BNP (last 3 results)  Recent Labs  03/30/16 1605 08/14/16 1326  BNP 858.0* 141.0*    ProBNP (last 3 results) No results for input(s): PROBNP in the last 8760 hours.  CBG: No results for input(s): GLUCAP in the last 168 hours.     SignedLelon Frohlich  [facility] Pager: [phone] 08/15/2016, 11:20 AM

## 2016-08-19 LAB — CULTURE, BLOOD (ROUTINE X 2)
Culture: NO GROWTH
Culture: NO GROWTH
Special Requests: ADEQUATE
Special Requests: ADEQUATE

## 2017-03-18 ENCOUNTER — Ambulatory Visit (INDEPENDENT_AMBULATORY_CARE_PROVIDER_SITE_OTHER): Payer: Medicare Other | Admitting: Otolaryngology

## 2017-03-18 DIAGNOSIS — H903 Sensorineural hearing loss, bilateral: Secondary | ICD-10-CM | POA: Diagnosis not present

## 2017-03-18 DIAGNOSIS — H6983 Other specified disorders of Eustachian tube, bilateral: Secondary | ICD-10-CM

## 2017-05-20 ENCOUNTER — Ambulatory Visit (INDEPENDENT_AMBULATORY_CARE_PROVIDER_SITE_OTHER): Payer: Medicare Other | Admitting: Otolaryngology

## 2017-05-20 DIAGNOSIS — H6983 Other specified disorders of Eustachian tube, bilateral: Secondary | ICD-10-CM | POA: Diagnosis not present

## 2017-05-20 DIAGNOSIS — J310 Chronic rhinitis: Secondary | ICD-10-CM

## 2017-05-20 DIAGNOSIS — J343 Hypertrophy of nasal turbinates: Secondary | ICD-10-CM

## 2017-06-01 ENCOUNTER — Emergency Department (HOSPITAL_COMMUNITY)
Admission: EM | Admit: 2017-06-01 | Discharge: 2017-06-01 | Disposition: A | Discharge status: Home / Self Care | Payer: Medicare Other | Attending: Emergency Medicine | Admitting: Emergency Medicine

## 2017-06-01 ENCOUNTER — Emergency Department (HOSPITAL_COMMUNITY): Payer: Medicare Other

## 2017-06-01 ENCOUNTER — Other Ambulatory Visit: Payer: Self-pay

## 2017-06-01 ENCOUNTER — Encounter (HOSPITAL_COMMUNITY): Payer: Self-pay | Admitting: Emergency Medicine

## 2017-06-01 DIAGNOSIS — Z79899 Other long term (current) drug therapy: Secondary | ICD-10-CM | POA: Diagnosis not present

## 2017-06-01 DIAGNOSIS — Z96651 Presence of right artificial knee joint: Secondary | ICD-10-CM | POA: Diagnosis not present

## 2017-06-01 DIAGNOSIS — R0600 Dyspnea, unspecified: Secondary | ICD-10-CM | POA: Diagnosis present

## 2017-06-01 DIAGNOSIS — E1122 Type 2 diabetes mellitus with diabetic chronic kidney disease: Secondary | ICD-10-CM | POA: Diagnosis not present

## 2017-06-01 DIAGNOSIS — I5040 Unspecified combined systolic (congestive) and diastolic (congestive) heart failure: Secondary | ICD-10-CM | POA: Insufficient documentation

## 2017-06-01 DIAGNOSIS — Z7901 Long term (current) use of anticoagulants: Secondary | ICD-10-CM | POA: Diagnosis not present

## 2017-06-01 DIAGNOSIS — N183 Chronic kidney disease, stage 3 (moderate): Secondary | ICD-10-CM | POA: Diagnosis not present

## 2017-06-01 DIAGNOSIS — I482 Chronic atrial fibrillation, unspecified: Secondary | ICD-10-CM

## 2017-06-01 DIAGNOSIS — I130 Hypertensive heart and chronic kidney disease with heart failure and stage 1 through stage 4 chronic kidney disease, or unspecified chronic kidney disease: Secondary | ICD-10-CM | POA: Diagnosis not present

## 2017-06-01 LAB — URINALYSIS, ROUTINE W REFLEX MICROSCOPIC
Bacteria, UA: NONE SEEN
Bilirubin Urine: NEGATIVE
Glucose, UA: NEGATIVE mg/dL
Hgb urine dipstick: NEGATIVE
Ketones, ur: NEGATIVE mg/dL
Leukocytes, UA: NEGATIVE
Nitrite: NEGATIVE
Protein, ur: 100 mg/dL — AB
Specific Gravity, Urine: 1.017 (ref 1.005–1.030)
pH: 5 (ref 5.0–8.0)

## 2017-06-01 LAB — COMPREHENSIVE METABOLIC PANEL
ALT: 23 U/L (ref 14–54)
AST: 23 U/L (ref 15–41)
Albumin: 3.9 g/dL (ref 3.5–5.0)
Alkaline Phosphatase: 90 U/L (ref 38–126)
Anion gap: 12 (ref 5–15)
BUN: 29 mg/dL — ABNORMAL HIGH (ref 6–20)
CO2: 19 mmol/L — ABNORMAL LOW (ref 22–32)
Calcium: 9 mg/dL (ref 8.9–10.3)
Chloride: 108 mmol/L (ref 101–111)
Creatinine, Ser: 1.47 mg/dL — ABNORMAL HIGH (ref 0.44–1.00)
GFR calc Af Amer: 36 mL/min — ABNORMAL LOW (ref >60)
GFR calc non Af Amer: 31 mL/min — ABNORMAL LOW (ref >60)
Glucose, Bld: 142 mg/dL — ABNORMAL HIGH (ref 65–99)
Potassium: 4.6 mmol/L (ref 3.5–5.1)
Sodium: 139 mmol/L (ref 135–145)
Total Bilirubin: 1.1 mg/dL (ref 0.3–1.2)
Total Protein: 6.9 g/dL (ref 6.5–8.1)

## 2017-06-01 LAB — CBC WITH DIFFERENTIAL/PLATELET
Basophils Absolute: 0 10*3/uL (ref 0.0–0.1)
Basophils Relative: 0 %
Eosinophils Absolute: 0.4 10*3/uL (ref 0.0–0.7)
Eosinophils Relative: 8 %
HCT: 38.7 % (ref 36.0–46.0)
Hemoglobin: 12 g/dL (ref 12.0–15.0)
Lymphocytes Relative: 26 %
Lymphs Abs: 1.2 10*3/uL (ref 0.7–4.0)
MCH: 27.9 pg (ref 26.0–34.0)
MCHC: 31 g/dL (ref 30.0–36.0)
MCV: 90 fL (ref 78.0–100.0)
Monocytes Absolute: 0.4 10*3/uL (ref 0.1–1.0)
Monocytes Relative: 9 %
Neutro Abs: 2.7 10*3/uL (ref 1.7–7.7)
Neutrophils Relative %: 57 %
Platelets: 160 10*3/uL (ref 150–400)
RBC: 4.3 MIL/uL (ref 3.87–5.11)
RDW: 16.6 % — ABNORMAL HIGH (ref 11.5–15.5)
WBC: 4.7 10*3/uL (ref 4.0–10.5)

## 2017-06-01 LAB — BRAIN NATRIURETIC PEPTIDE: B Natriuretic Peptide: 478 pg/mL — ABNORMAL HIGH (ref 0.0–100.0)

## 2017-06-01 LAB — TROPONIN I: Troponin I: 0.03 ng/mL (ref <0.03)

## 2017-06-01 MED ORDER — METOPROLOL TARTRATE 5 MG/5ML IV SOLN
INTRAVENOUS | Status: AC
  Filled 2017-06-01: qty 5

## 2017-06-01 MED ORDER — METOPROLOL TARTRATE 5 MG/5ML IV SOLN
5.0000 mg | Freq: Once | INTRAVENOUS | Status: DC
  Filled 2017-06-01: qty 5

## 2017-06-01 MED ORDER — CARVEDILOL 12.5 MG PO TABS: 12.5000 mg | ORAL_TABLET | Freq: Two times a day (BID) | ORAL | 0 refills | Status: DC

## 2017-06-01 MED ORDER — ALBUTEROL SULFATE (2.5 MG/3ML) 0.083% IN NEBU: 5.0000 mg | INHALATION_SOLUTION | Freq: Once | RESPIRATORY_TRACT | Status: DC

## 2017-06-01 NOTE — Discharge Instructions (Addendum)
Increase Coreg to 2 pills twice a day.  Recheck with her doctor next week

## 2017-06-01 NOTE — ED Notes (Signed)
Attempted IV x 2 with no success. 

## 2017-06-01 NOTE — ED Provider Notes (Signed)
[facility] EMERGENCY DEPARTMENT Provider Note   CSN: [id number] Arrival date & time: 06/01/17  1612     History   Chief Complaint Chief Complaint  Patient presents with  . Shortness of Breath    HPI [name] [name] is a 84 y.o. female.  HPI  84 yo female ho a fib, chf, ckd present s with worsening dyspnea for days to weeks now worse today.  Denies fever, chills, nausea and vomiting.  Nonproductive cough.  States she has had one minute of anterior chest pain today while waiting for evaluation.  No history of dvt or pe, on eliquis for a fib.  Denies increased weight, peripheral edema, or orthopnea.  DOE, but it has not restricted her activity.  States today also was lightheaded today but did not tell anyone. Denies current lightheadedness.  Daughter reports that her Cardizem was stopped by her kidney doctor.  She has been taking Coreg 12.5 mg twice daily.  Past Medical History:  Diagnosis Date  . Acid reflux   . ARF (acute renal failure) ([place]) 09/16/2010   Prerenal. Creatinine 1.36 before discharge.  . Atrial fibrillation ([place])   . CHF (congestive heart failure) (HCC)    Hx of diastolic CHF.  Marland Kitchen Chronic anticoagulation   . CKD (chronic kidney disease) stage 3, GFR 30-59 ml/min (HCC)   . Diverticular hemorrhage 2011   Per colonoscopy  . Diverticulosis 2011   Per colonoscopy  . GI bleeding 08/03/2011   Not scoped but likely secondary to hemorrhoids or diverticulosis  . Gout   . Hx of medication noncompliance   . Hypercholesterolemia   . Hyperglycemia    Query DM 2  . Hypertension   . Internal hemorrhoids 2011   Per colonoscopy  . Seasonal allergies   . Thrombocytopenia due to drugs    Allopurinol    Patient Active Problem List   Diagnosis Date Noted  . Acute hypoxemic respiratory failure ([place]) 08/14/2016  . Hypertensive emergency 08/14/2016  . High anion gap metabolic acidosis 47/82/9562  . Acute on chronic combined systolic and diastolic CHF (congestive heart  failure) ([place]) 03/30/2016  . CKD (chronic kidney disease), stage III ([place]) 03/30/2016  . Hypertension 03/30/2016  . Febrile illness   . Back pain 04/10/2014  . Fever 04/10/2014  . GI bleeding 08/03/2011  . HTN (hypertension), malignant 08/03/2011  . Diverticulosis 08/03/2011  . Internal hemorrhoids 08/03/2011  . Hyperglycemia 08/03/2011  . Leukocytosis (leucocytosis) 09/25/2010  . Borderline abnormal TFTs 09/21/2010  . Hypocalcemia 09/18/2010  . Hypernatremia 09/18/2010  . Gout attack 09/18/2010  . Hypotension 09/17/2010  . Thrombocytopenia ([place]) 09/17/2010  . DM type 2 (diabetes mellitus, type 2) ([place]) 09/17/2010  . Hypomagnesemia 09/17/2010  . SBO (small bowel obstruction) ([place]) 09/16/2010  . Chronic kidney disease (CKD), stage IV (severe) ([place]) 09/16/2010  . Hypokalemia 09/16/2010  . Chronic a-fib ([place]) 09/16/2010  . Warfarin anticoagulation 09/16/2010  . Elevated troponin I level 09/16/2010    Past Surgical History:  Procedure Laterality Date  . APPENDECTOMY    . BUNIONECTOMY    . CATARACT EXTRACTION    . CESAREAN SECTION    . ESOPHAGOGASTRODUODENOSCOPY  08/20/09   small hiatal hernia/mild gastritis  . ileocolonoscopy  08/20/09   normal terminal ileum/pancolonic diverticula/no polyps/benign colon mucosa  . JOINT REPLACEMENT     Total right knee 2007  . LAPAROTOMY  09/22/2010   Procedure: EXPLORATORY LAPAROTOMY;  Surgeon: [name];  Location: [facility];  Service: General;  Laterality: N/A;  Lysis  of Adhesions  . TUBAL LIGATION       OB History    Gravida  4   Para  4   Term  3   Preterm  1   AB      Living  3     SAB      TAB      Ectopic      Multiple      Live Births               Home Medications    Prior to Admission medications   Medication Sig Start Date End Date Taking? Authorizing Provider  allopurinol (ZYLOPRIM) 100 MG tablet Take 100 mg by mouth daily.    [provider]  apixaban (ELIQUIS) 5 MG TABS tablet Take 5  mg by mouth 2 (two) times daily.    [provider]  Artificial Tear Ointment (DRY EYES OP) Place 1 drop into both eyes as needed (dry eyes).    [provider]  carvedilol (COREG) 6.25 MG tablet Take 1 tablet (6.25 mg total) by mouth 2 (two) times daily with a meal. 08/15/16   Isaac Bliss, Rayford Halsted, MD  ferrous sulfate 325 (65 FE) MG tablet Take 325 mg by mouth 2 (two) times daily with a meal.     [provider]  fluticasone (FLONASE) 50 MCG/ACT nasal spray Place 1 spray into both nostrils daily.    [provider]  furosemide (LASIX) 40 MG tablet Take 1 tablet (40 mg total) by mouth daily. Patient taking differently: Take 20 mg by mouth daily.  04/01/16   Isaac Bliss, Rayford Halsted, MD  montelukast (SINGULAIR) 10 MG tablet Take 10 mg by mouth at bedtime.     [provider]  omeprazole (PRILOSEC) 20 MG capsule Take 1 capsule (20 mg total) by mouth daily. 06/19/16   Maczis, Barth Kirks, PA-C  potassium chloride SA (K-DUR,KLOR-CON) 20 MEQ tablet Take 1 tablet (20 mEq total) by mouth daily. 04/01/16   Isaac Bliss, Rayford Halsted, MD  pravastatin (PRAVACHOL) 40 MG tablet Take 40 mg by mouth every evening.     [provider]  ranitidine (ZANTAC) 150 MG tablet Take 150 mg by mouth daily.  02/03/16   [provider]  TRAZODONE HCL PO Take 1 tablet by mouth at bedtime as needed.    [provider]    Family History Family History  Problem Relation Age of Onset  . Heart disease Mother   . Diabetes Mother     Social History Social History   Tobacco Use  . Smoking status: Never Smoker  . Smokeless tobacco: Never Used  Substance Use Topics  . Alcohol use: No  . Drug use: No     Allergies   Codeine; Sulfa antibiotics; and Sulfur   Review of Systems Review of Systems  Constitutional: Negative for activity change, appetite change, chills and fever.  HENT: Positive for congestion.   Respiratory: Positive for cough and  shortness of breath.   Cardiovascular: Positive for chest pain. Negative for leg swelling.  Gastrointestinal: Positive for abdominal pain. Negative for nausea and vomiting.  Endocrine: Negative.   Genitourinary: Positive for frequency.  Musculoskeletal: Negative.   Skin: Negative.   Neurological: Negative.   Hematological: Negative.   Psychiatric/Behavioral: Negative.   All other systems reviewed and are negative.    Physical Exam Updated Vital Signs BP (!) 166/105 (BP Location: Right Arm) Comment: DR Marqueze Ramcharan present during vital sign assessment  Pulse Marland Kitchen)  120   Temp 97.6 F (36.4 C) (Oral)   Resp (!) 25   Ht 1.651 m (5\' 5" )   Wt 79.4 kg (175 lb)   SpO2 97%   BMI 29.12 kg/m   Physical Exam  Constitutional: She is oriented to person, place, and time. She appears well-developed and well-nourished. She does not appear ill.  HENT:  Head: Normocephalic and atraumatic.  Mouth/Throat: Oropharynx is clear and moist.  Eyes: Pupils are equal, round, and reactive to light. EOM are normal.  Neck: Normal range of motion. Neck supple.  Cardiovascular: An irregularly irregular rhythm present. Tachycardia present.  Pulmonary/Chest: Effort normal and breath sounds normal.  Abdominal: Soft. Bowel sounds are normal.  Musculoskeletal: Normal range of motion.  Trace edema bilaterally  Neurological: She is alert and oriented to person, place, and time.  Skin: Skin is warm. Capillary refill takes less than 2 seconds.  Psychiatric: She has a normal mood and affect.  Nursing note and vitals reviewed.    ED Treatments / Results  Labs (all labs ordered are listed, but only abnormal results are displayed) Labs Reviewed  CBC WITH DIFFERENTIAL/PLATELET - Abnormal; Notable for the following components:      Result Value   RDW 16.6 (*)    All other components within normal limits  COMPREHENSIVE METABOLIC PANEL - Abnormal; Notable for the following components:   CO2 19 (*)    Glucose, Bld 142 (*)     BUN 29 (*)    Creatinine, Ser 1.47 (*)    GFR calc non Af Amer 31 (*)    GFR calc Af Amer 36 (*)    All other components within normal limits  BRAIN NATRIURETIC PEPTIDE - Abnormal; Notable for the following components:   B Natriuretic Peptide 478.0 (*)    All other components within normal limits  TROPONIN I - Abnormal; Notable for the following components:   Troponin I 0.03 (*)    All other components within normal limits    EKG EKG Interpretation  Date/Time:  Saturday June 01 2017 16:39:12 EDT Ventricular Rate:  97 PR Interval:    QRS Duration: 82 QT Interval:  362 QTC Calculation: 459 R Axis:   110 Text Interpretation:  Atrial fibrillation Right axis deviation Possible Anterior infarct , age undetermined Abnormal ECG since last tracing no significant change Confirmed by Daleen Bo [phone]) on 06/01/2017 4:52:46 PM   Radiology Dg Chest 2 View  Result Date: 06/01/2017 CLINICAL DATA:  SOB, pt stated she has problems with allergies this year, hx of asthma EXAM: CHEST - 2 VIEW COMPARISON:  08/14/2016 FINDINGS: Moderate enlargement of the cardiopericardial silhouette. No mediastinal or hilar masses. No convincing adenopathy. Mild linear areas of opacity in the mid lungs consistent with atelectasis. No evidence of pneumonia. No pulmonary edema. No pleural effusion or pneumothorax. Skeletal structures are demineralized but grossly intact. IMPRESSION: 1. No acute cardiopulmonary disease. 2. Moderate cardiomegaly. Electronically Signed   By: Lajean Manes M.D.   On: 06/01/2017 17:26    Procedures Procedures (including critical care time)  Medications Ordered in ED Medications  albuterol (PROVENTIL) (2.5 MG/3ML) 0.083% nebulizer solution 5 mg (5 mg Nebulization Not Given 06/01/17 1840)     Initial Impression / Assessment and Plan / ED Course  I have reviewed the triage vital signs and the nursing notes.  Pertinent labs & imaging results that were available during my care of  the patient were reviewed by me and considered in my medical decision making (see chart  for details).     84 year old female presents today with dyspnea on exertion.  Evaluation here revealed that initially she had an increased heart rate to 110 120 with her A. fib.  Resting on the bed her heart rate has decreased into the 90s.  Given her evaluation, I feel that her dyspnea is most likely related to her increased heart rate.  She had been on Cardizem and this was stopped.  I discussed the patient with cardiology on-call.  Plan increasing her Coreg to 25 mg twice daily for the next few days.  I discussed this with the patient and her daughter she will recheck with her doctor next week.  We have discussed return precautions need for close follow-up. Patient's respiratory rate is 20.  I reviewed the nursing notes that showed a respiratory rate of 30.  By multiple exams her heart rate has been 16-20.  Her oxygen saturation is currently 100%.  Intermittently the monitor does not correlate with her heart rate and shows lower.  Final Clinical Impressions(s) / ED Diagnoses   Final diagnoses:  Chronic atrial fibrillation [facility])    ED Discharge Orders    None       Pattricia Boss, MD 06/01/17 2115

## 2017-06-01 NOTE — ED Notes (Signed)
Call from [place] 0.03 given to CN mandy due to pt in [facility]

## 2017-06-01 NOTE — ED Triage Notes (Signed)
Patient reports she has been seeing an allergist, has had SOB. Symptoms became worse this am. Patient reports abdominal distention. Pulse rate variable in Triage, history of A-fib.

## 2017-07-15 ENCOUNTER — Ambulatory Visit (INDEPENDENT_AMBULATORY_CARE_PROVIDER_SITE_OTHER): Payer: Medicare Other | Admitting: Otolaryngology

## 2017-07-15 DIAGNOSIS — J310 Chronic rhinitis: Secondary | ICD-10-CM

## 2017-07-15 DIAGNOSIS — J343 Hypertrophy of nasal turbinates: Secondary | ICD-10-CM

## 2017-07-15 DIAGNOSIS — J342 Deviated nasal septum: Secondary | ICD-10-CM

## 2017-08-20 ENCOUNTER — Ambulatory Visit (INDEPENDENT_AMBULATORY_CARE_PROVIDER_SITE_OTHER): Payer: Medicare Other | Admitting: Urology

## 2017-08-20 DIAGNOSIS — R3915 Urgency of urination: Secondary | ICD-10-CM | POA: Diagnosis not present

## 2017-08-20 DIAGNOSIS — R350 Frequency of micturition: Secondary | ICD-10-CM

## 2017-08-20 DIAGNOSIS — N3941 Urge incontinence: Secondary | ICD-10-CM

## 2017-09-10 ENCOUNTER — Encounter: Payer: Self-pay | Admitting: Allergy & Immunology

## 2017-09-10 ENCOUNTER — Ambulatory Visit (INDEPENDENT_AMBULATORY_CARE_PROVIDER_SITE_OTHER): Payer: Medicare Other | Admitting: Allergy & Immunology

## 2017-09-10 VITALS — BP 108/70 | HR 99 | Temp 98.1°F | Resp 16 | Ht 62.5 in | Wt 187.8 lb

## 2017-09-10 DIAGNOSIS — R05 Cough: Secondary | ICD-10-CM | POA: Diagnosis not present

## 2017-09-10 DIAGNOSIS — K219 Gastro-esophageal reflux disease without esophagitis: Secondary | ICD-10-CM | POA: Diagnosis not present

## 2017-09-10 DIAGNOSIS — R059 Cough, unspecified: Secondary | ICD-10-CM

## 2017-09-10 DIAGNOSIS — J310 Chronic rhinitis: Secondary | ICD-10-CM | POA: Diagnosis not present

## 2017-09-10 MED ORDER — LEVALBUTEROL HCL 1.25 MG/3ML IN NEBU: 1.2500 mg | INHALATION_SOLUTION | RESPIRATORY_TRACT | 2 refills | Status: AC | PRN

## 2017-09-10 MED ORDER — CETIRIZINE HCL 10 MG PO TABS: 10.0000 mg | ORAL_TABLET | Freq: Every day | ORAL | 2 refills | Status: DC

## 2017-09-10 NOTE — Progress Notes (Signed)
NEW PATIENT  Date of Service/Encounter:  09/10/17  Referring provider: Vidal Schwalbe, MD   Assessment:   Coughing  Chronic rhinitis - with uncontrolled postnasal drip  GERD - on PPI + H2 blocker   [name] is a delightful 84 year old female presenting with a cough for several weeks as well as chronic rhinitis.  She has been followed by Dr. Benjamine Mola in the past who referred her here.  It is not clear whether her cough is more related to postnasal drip versus asthma.  Her lung function testing today was actually normal and did not improve at all with the nebulizer treatment.  However, she does report symptomatic improvement.  In any case, a think we should maximize her rhinitis control first to rule out postnasal drip as a cause of her coughing before adding any asthma medications. We are going to get some blood work to evaluate for allergies. She would like a refill on her nebulizer solution.  We will send in a prescription for Xopenex due to her history of atrial fibrillation.  Her reflux is adequately controlled with both a proton pump inhibitor, therefore I am doubtful whether this is contributing to her cough.     Plan/Recommendations:   21. Coughing - Lung testing look mostly normal today. - It did not change much with the nebulizer treatment, but you did feel better with it.  - In any case, continue with the Xopenex nebulizer treatment every 4-6 hours as needed for coughing/wheezing.   2. Chronic rhinitis - We will get blood testing to look for allergies. - We will call you in 1-2 weeks with the results of the testing. - In the meantime, stop the loratadine.  - Start Zyrtec (cetirizine) 10mg  once daily.  - Continue with fluticasone 1-2 sprays per nostril daily. - Continue with Atrovent (ipratropium) 1 spray per nostril every 6 hours as needed.  3. Return in about 3 months (around 12/11/2017).  Subjective:   [name] [name] is a 84 y.o. female presenting today for  evaluation of  Chief Complaint  Patient presents with  . Nasal Congestion  . Cough  . Burning Eyes  . Allergy Testing    [name] [name] has a history of the following: Patient Active Problem List   Diagnosis Date Noted  . Acute hypoxemic respiratory failure ([place]) 08/14/2016  . Hypertensive emergency 08/14/2016  . High anion gap metabolic acidosis 86/57/8469  . Acute on chronic combined systolic and diastolic CHF (congestive heart failure) ([place]) 03/30/2016  . CKD (chronic kidney disease), stage III ([place]) 03/30/2016  . Hypertension 03/30/2016  . Febrile illness   . Back pain 04/10/2014  . Fever 04/10/2014  . GI bleeding 08/03/2011  . HTN (hypertension), malignant 08/03/2011  . Diverticulosis 08/03/2011  . Internal hemorrhoids 08/03/2011  . Hyperglycemia 08/03/2011  . Leukocytosis (leucocytosis) 09/25/2010  . Borderline abnormal TFTs 09/21/2010  . Hypocalcemia 09/18/2010  . Hypernatremia 09/18/2010  . Gout attack 09/18/2010  . Hypotension 09/17/2010  . Thrombocytopenia ([place]) 09/17/2010  . DM type 2 (diabetes mellitus, type 2) ([place]) 09/17/2010  . Hypomagnesemia 09/17/2010  . SBO (small bowel obstruction) ([place]) 09/16/2010  . Chronic kidney disease (CKD), stage IV (severe) ([place]) 09/16/2010  . Hypokalemia 09/16/2010  . Chronic a-fib ([place]) 09/16/2010  . Warfarin anticoagulation 09/16/2010  . Elevated troponin I level 09/16/2010    History obtained from: chart review and patient and her daughter, who is the "favorite daughter".  [name] [name] was referred by Vidal Schwalbe, MD.  [name] [name] is a 84 y.o. female presenting for an evaluation of coughing and rhinitis.   Asthma/Respiratory Symptom History: She does have a history of a cough that has been ongoing for a period of weeks. Her first cough was within the last couple of months. She has a nebulizer at home, but does need a refill of the solution.  It is unclear why she received a nebulizer, as she has had this for a  couple of years.  However, it seems that she may have received it due to in any case, she has not tried using it with this current cough since she is out of solution.  Allergic Rhinitis Symptom History: She has a long standing history of congestion of her nose. This has been ongoing for years. She does use fluticasone as well as loratadine. She has been on this for a "good minute". She also has Atrovent nasal spray. She has been followed by Dr. Benjamine Mola, but I do not have a copy of his notes with me today.   She does have a history of reflux and is on omeprazole and ranitidine.  She also has a history of atrial fibrillation.  Otherwise, there is no history of other atopic diseases, including drug allergies, food allergies, stinging insect allergies, or urticaria. There is no significant infectious history. Vaccinations are up to date.    Past Medical History: Patient Active Problem List   Diagnosis Date Noted  . Acute hypoxemic respiratory failure ([place]) 08/14/2016  . Hypertensive emergency 08/14/2016  . High anion gap metabolic acidosis 19/14/7829  . Acute on chronic combined systolic and diastolic CHF (congestive heart failure) ([place]) 03/30/2016  . CKD (chronic kidney disease), stage III ([place]) 03/30/2016  . Hypertension 03/30/2016  . Febrile illness   . Back pain 04/10/2014  . Fever 04/10/2014  . GI bleeding 08/03/2011  . HTN (hypertension), malignant 08/03/2011  . Diverticulosis 08/03/2011  . Internal hemorrhoids 08/03/2011  . Hyperglycemia 08/03/2011  . Leukocytosis (leucocytosis) 09/25/2010  . Borderline abnormal TFTs 09/21/2010  . Hypocalcemia 09/18/2010  . Hypernatremia 09/18/2010  . Gout attack 09/18/2010  . Hypotension 09/17/2010  . Thrombocytopenia ([place]) 09/17/2010  . DM type 2 (diabetes mellitus, type 2) ([place]) 09/17/2010  . Hypomagnesemia 09/17/2010  . SBO (small bowel obstruction) ([place]) 09/16/2010  . Chronic kidney disease (CKD), stage IV (severe) ([place]) 09/16/2010  .  Hypokalemia 09/16/2010  . Chronic a-fib ([place]) 09/16/2010  . Warfarin anticoagulation 09/16/2010  . Elevated troponin I level 09/16/2010    Medication List:  Allergies as of 09/10/2017      Reactions   Codeine Nausea And Vomiting   Sulfa Antibiotics Nausea And Vomiting   Sulfur Nausea And Vomiting      Medication List        Accurate as of 09/10/17  3:25 PM. Always use your most recent med list.          allopurinol 100 MG tablet Commonly known as:  ZYLOPRIM Take 100 mg by mouth daily.   carvedilol 12.5 MG tablet Commonly known as:  COREG Take 1 tablet (12.5 mg total) by mouth 2 (two) times daily with a meal. Take two tablets two times daily   cetirizine 10 MG tablet Commonly known as:  ZYRTEC Take 1 tablet (10 mg total) by mouth daily.   DRY EYES OP Place 1 drop into both eyes as needed (dry eyes).   ELIQUIS 5 MG Tabs tablet Generic drug:  apixaban Take 5 mg by mouth 2 (two) times daily.  ferrous sulfate 325 (65 FE) MG tablet Take 325 mg by mouth 2 (two) times daily with a meal.   fluticasone 50 MCG/ACT nasal spray Commonly known as:  FLONASE Place 1 spray into both nostrils daily.   furosemide 40 MG tablet Commonly known as:  LASIX Take 1 tablet (40 mg total) by mouth daily.   ipratropium 0.06 % nasal spray Commonly known as:  ATROVENT Place 2 sprays into both nostrils 2 (two) times daily at 10 AM and 5 PM.   levalbuterol 1.25 MG/3ML nebulizer solution Commonly known as:  XOPENEX Take 1.25 mg by nebulization every 4 (four) hours as needed for wheezing.   lisinopril 10 MG tablet Commonly known as:  PRINIVIL,ZESTRIL Take 10 mg by mouth daily.   loratadine 10 MG tablet Commonly known as:  CLARITIN Take 10 mg by mouth daily.   montelukast 10 MG tablet Commonly known as:  SINGULAIR Take 10 mg by mouth at bedtime.   multivitamin with minerals Tabs tablet Take 1 tablet by mouth daily.   omeprazole 20 MG capsule Commonly known as:  PRILOSEC Take 1  capsule (20 mg total) by mouth daily.   potassium chloride SA 20 MEQ tablet Commonly known as:  K-DUR,KLOR-CON Take 1 tablet (20 mEq total) by mouth daily.   pravastatin 40 MG tablet Commonly known as:  PRAVACHOL Take 40 mg by mouth every evening.   ranitidine 150 MG tablet Commonly known as:  ZANTAC Take 150 mg by mouth daily.   sodium chloride 0.65 % Soln nasal spray Commonly known as:  OCEAN Place 1 spray into both nostrils as needed for congestion.   Vitamin D (Ergocalciferol) 50000 units Caps capsule Commonly known as:  DRISDOL Take 50,000 Units by mouth every 30 (thirty) days. Administered on the 1st of each month       Birth History: non-contributory.  Developmental History: non-contributory.   Past Surgical History: Past Surgical History:  Procedure Laterality Date  . ADENOIDECTOMY    . APPENDECTOMY    . BUNIONECTOMY    . CATARACT EXTRACTION    . CESAREAN SECTION    . ESOPHAGOGASTRODUODENOSCOPY  08/20/09   small hiatal hernia/mild gastritis  . ileocolonoscopy  08/20/09   normal terminal ileum/pancolonic diverticula/no polyps/benign colon mucosa  . JOINT REPLACEMENT     Total right knee 2007  . LAPAROTOMY  09/22/2010   Procedure: EXPLORATORY LAPAROTOMY;  Surgeon: [name];  Location: [facility];  Service: General;  Laterality: N/A;  Lysis of Adhesions  . TONSILLECTOMY    . TUBAL LIGATION       Family History: Family History  Problem Relation Age of Onset  . Heart disease Mother   . Diabetes Mother      Social History: Lan[place] lives at home. She lives in a house that is 82 years old. There are hardwood floors throughout the home. They have electric heating and central cooling. There are cats and dogs outside of the home. There are no dust mite coverings on the bedding. There is no tobacco exposure in the home. She is currently retired but worked in a tobacco Porter.     Review of Systems: a 14-point review of systems is pertinent for what is  mentioned in HPI.  Otherwise, all other systems were negative. Constitutional: negative other than that listed in the HPI Eyes: negative other than that listed in the HPI Ears, nose, mouth, throat, and face: negative other than that listed in the HPI Respiratory: negative other than that listed in the  HPI Cardiovascular: negative other than that listed in the HPI Gastrointestinal: negative other than that listed in the HPI Genitourinary: negative other than that listed in the HPI Integument: negative other than that listed in the HPI Hematologic: negative other than that listed in the HPI Musculoskeletal: negative other than that listed in the HPI Neurological: negative other than that listed in the HPI Allergy/Immunologic: negative other than that listed in the HPI    Objective:   Blood pressure 108/70, pulse 99, temperature 98.1 F (36.7 C), temperature source Oral, resp. rate 16, height 5' 2.5" (1.588 m), weight 187 lb 12.8 oz (85.2 kg), SpO2 95 %. Body mass index is 33.8 kg/m.   Physical Exam:  General: Alert, interactive, in no acute distress. Delightful and pleasant.  Eyes: No conjunctival injection bilaterally, no discharge on the right, no discharge on the left and no Horner-Trantas dots present. PERRL bilaterally. EOMI without pain. No photophobia.  Ears: Right TM pearly gray with normal light reflex, Left TM pearly gray with normal light reflex, Right TM intact without perforation and Left TM intact without perforation.  Nose/Throat: External nose within normal limits and septum midline. Turbinates edematous without discharge. Posterior oropharynx mildly erythematous without cobblestoning in the posterior oropharynx. Tonsils 2+ without exudates.  Tongue without thrush. Neck: Supple without thyromegaly. Trachea midline. Adenopathy: no enlarged lymph nodes appreciated in the anterior cervical, occipital, axillary, epitrochlear, inguinal, or popliteal regions. Lungs: Clear to  auscultation without wheezing, rhonchi or rales. No increased work of breathing. Coarse upper airway sounds throughout.  CV: Normal S1/S2. No murmurs. Capillary refill <2 seconds.  Abdomen: Nondistended, nontender. No guarding or rebound tenderness. Bowel sounds present in all fields and hypoactive  Skin: Warm and dry, without lesions or rashes. Extremities:  No clubbing, cyanosis or edema. Neuro:   Grossly intact. No focal deficits appreciated. Responsive to questions.  Diagnostic studies:  Spirometry: results normal (FEV1: 0.86 L, FVC: 1.24 L, FEV1/FVC: 68%).    Spirometry consistent with normal pattern. Xopenex nebulixer treatment treatment given in clinic with no improvement, however the FEF 25-75% did increase 65%.   Allergy Studies: none (deferred since she had taken antihistamines this morning)     Salvatore Marvel, MD [facility] [place]

## 2017-09-10 NOTE — Patient Instructions (Addendum)
1. Coughing - Lung testing look mostly normal today. - It did not change much with the nebulizer treatment, but you did feel better with it.  - In any case, continue with the Xopenex nebulizer treatment every 4-6 hours as needed for coughing/wheezing.   2. Chronic rhinitis - We will get blood testing to look for allergies. - We will call you in 1-2 weeks with the results of the testing. - In the meantime, stop the loratadine.  - Start Zyrtec (cetirizine) 10mg  once daily.  - Continue with fluticasone 1-2 sprays per nostril daily. - Continue with Atrovent (ipratropium) 1 spray per nostril every 6 hours as needed.  3. Return in about 3 months (around 12/11/2017).   Please inform us of any Emergency Department visits, hospitalizations, or changes in symptoms. Call us before going to the ED for breathing or allergy symptoms since we might be able to fit you in for a sick visit. Feel free to contact us anytime with any questions, problems, or concerns.  It was a pleasure to meet you and your family today! Glad I was able to meet the Favorite Daughter today!   Websites that have reliable patient information: 1. American Academy of Asthma, Allergy, and Immunology: www.aaaai.org 2. Food Allergy Research and Education (FARE): foodallergy.org 3. Mothers of Asthmatics: http://www.asthmacommunitynetwork.org 4. American College of Allergy, Asthma, and Immunology: MonthlyElectricBill.co.uk   Make sure you are registered to vote! If you have moved or changed any of your contact information, you will need to get this updated before voting!

## 2017-09-14 LAB — IGE+ALLERGENS ZONE 2(30)

## 2017-10-23 ENCOUNTER — Inpatient Hospital Stay (HOSPITAL_COMMUNITY)
Admission: EM | Admit: 2017-10-23 | Discharge: 2017-10-27 | DRG: 291 | Disposition: A | Discharge status: Home Health | Payer: Medicare Other | Attending: Family Medicine | Admitting: Family Medicine

## 2017-10-23 ENCOUNTER — Other Ambulatory Visit: Payer: Self-pay

## 2017-10-23 ENCOUNTER — Emergency Department (HOSPITAL_COMMUNITY): Payer: Medicare Other

## 2017-10-23 ENCOUNTER — Encounter (HOSPITAL_COMMUNITY): Payer: Self-pay

## 2017-10-23 DIAGNOSIS — I482 Chronic atrial fibrillation, unspecified: Secondary | ICD-10-CM | POA: Diagnosis present

## 2017-10-23 DIAGNOSIS — E119 Type 2 diabetes mellitus without complications: Secondary | ICD-10-CM

## 2017-10-23 DIAGNOSIS — Z7901 Long term (current) use of anticoagulants: Secondary | ICD-10-CM

## 2017-10-23 DIAGNOSIS — Z96651 Presence of right artificial knee joint: Secondary | ICD-10-CM | POA: Diagnosis present

## 2017-10-23 DIAGNOSIS — Z882 Allergy status to sulfonamides status: Secondary | ICD-10-CM

## 2017-10-23 DIAGNOSIS — N189 Chronic kidney disease, unspecified: Secondary | ICD-10-CM

## 2017-10-23 DIAGNOSIS — I130 Hypertensive heart and chronic kidney disease with heart failure and stage 1 through stage 4 chronic kidney disease, or unspecified chronic kidney disease: Principal | ICD-10-CM | POA: Diagnosis present

## 2017-10-23 DIAGNOSIS — E7800 Pure hypercholesterolemia, unspecified: Secondary | ICD-10-CM | POA: Diagnosis present

## 2017-10-23 DIAGNOSIS — R778 Other specified abnormalities of plasma proteins: Secondary | ICD-10-CM | POA: Diagnosis present

## 2017-10-23 DIAGNOSIS — I5043 Acute on chronic combined systolic (congestive) and diastolic (congestive) heart failure: Secondary | ICD-10-CM | POA: Diagnosis not present

## 2017-10-23 DIAGNOSIS — R7989 Other specified abnormal findings of blood chemistry: Secondary | ICD-10-CM | POA: Diagnosis present

## 2017-10-23 DIAGNOSIS — R748 Abnormal levels of other serum enzymes: Secondary | ICD-10-CM

## 2017-10-23 DIAGNOSIS — E1165 Type 2 diabetes mellitus with hyperglycemia: Secondary | ICD-10-CM | POA: Diagnosis not present

## 2017-10-23 DIAGNOSIS — I509 Heart failure, unspecified: Secondary | ICD-10-CM

## 2017-10-23 DIAGNOSIS — Z888 Allergy status to other drugs, medicaments and biological substances status: Secondary | ICD-10-CM

## 2017-10-23 DIAGNOSIS — Z79899 Other long term (current) drug therapy: Secondary | ICD-10-CM

## 2017-10-23 DIAGNOSIS — Z23 Encounter for immunization: Secondary | ICD-10-CM

## 2017-10-23 DIAGNOSIS — E875 Hyperkalemia: Secondary | ICD-10-CM | POA: Diagnosis present

## 2017-10-23 DIAGNOSIS — N179 Acute kidney failure, unspecified: Secondary | ICD-10-CM | POA: Diagnosis present

## 2017-10-23 DIAGNOSIS — K219 Gastro-esophageal reflux disease without esophagitis: Secondary | ICD-10-CM | POA: Diagnosis present

## 2017-10-23 DIAGNOSIS — N183 Chronic kidney disease, stage 3 (moderate): Secondary | ICD-10-CM | POA: Diagnosis present

## 2017-10-23 DIAGNOSIS — Z885 Allergy status to narcotic agent status: Secondary | ICD-10-CM

## 2017-10-23 DIAGNOSIS — N393 Stress incontinence (female) (male): Secondary | ICD-10-CM | POA: Diagnosis present

## 2017-10-23 DIAGNOSIS — E1122 Type 2 diabetes mellitus with diabetic chronic kidney disease: Secondary | ICD-10-CM | POA: Diagnosis present

## 2017-10-23 LAB — CBC WITH DIFFERENTIAL/PLATELET
Basophils Absolute: 0 10*3/uL (ref 0.0–0.1)
Basophils Relative: 1 %
Eosinophils Absolute: 0.3 10*3/uL (ref 0.0–0.7)
Eosinophils Relative: 5 %
HCT: 36.2 % (ref 36.0–46.0)
Hemoglobin: 11.3 g/dL — ABNORMAL LOW (ref 12.0–15.0)
Lymphocytes Relative: 21 %
Lymphs Abs: 1.1 10*3/uL (ref 0.7–4.0)
MCH: 29 pg (ref 26.0–34.0)
MCHC: 31.2 g/dL (ref 30.0–36.0)
MCV: 93.1 fL (ref 78.0–100.0)
Monocytes Absolute: 0.7 10*3/uL (ref 0.1–1.0)
Monocytes Relative: 12 %
Neutro Abs: 3.2 10*3/uL (ref 1.7–7.7)
Neutrophils Relative %: 61 %
Platelets: 103 10*3/uL — ABNORMAL LOW (ref 150–400)
RBC: 3.89 MIL/uL (ref 3.87–5.11)
RDW: 17.4 % — ABNORMAL HIGH (ref 11.5–15.5)
WBC: 5.3 10*3/uL (ref 4.0–10.5)

## 2017-10-23 LAB — COMPREHENSIVE METABOLIC PANEL
ALT: 14 U/L (ref 0–44)
AST: 30 U/L (ref 15–41)
Albumin: 3.9 g/dL (ref 3.5–5.0)
Alkaline Phosphatase: 83 U/L (ref 38–126)
Anion gap: 5 (ref 5–15)
BUN: 68 mg/dL — ABNORMAL HIGH (ref 8–23)
CO2: 22 mmol/L (ref 22–32)
Calcium: 8.6 mg/dL — ABNORMAL LOW (ref 8.9–10.3)
Chloride: 114 mmol/L — ABNORMAL HIGH (ref 98–111)
Creatinine, Ser: 2.62 mg/dL — ABNORMAL HIGH (ref 0.44–1.00)
GFR calc Af Amer: 18 mL/min — ABNORMAL LOW (ref >60)
GFR calc non Af Amer: 16 mL/min — ABNORMAL LOW (ref >60)
Glucose, Bld: 169 mg/dL — ABNORMAL HIGH (ref 70–99)
Potassium: 5.5 mmol/L — ABNORMAL HIGH (ref 3.5–5.1)
Sodium: 141 mmol/L (ref 135–145)
Total Bilirubin: 1.3 mg/dL — ABNORMAL HIGH (ref 0.3–1.2)
Total Protein: 6.6 g/dL (ref 6.5–8.1)

## 2017-10-23 LAB — TROPONIN I
Troponin I: 0.03 ng/mL (ref <0.03)
Troponin I: 0.03 ng/mL (ref <0.03)

## 2017-10-23 LAB — BRAIN NATRIURETIC PEPTIDE: B Natriuretic Peptide: 314 pg/mL — ABNORMAL HIGH (ref 0.0–100.0)

## 2017-10-23 LAB — GLUCOSE, CAPILLARY: Glucose-Capillary: 104 mg/dL — ABNORMAL HIGH (ref 70–99)

## 2017-10-23 MED ORDER — FUROSEMIDE 10 MG/ML IJ SOLN
40.0000 mg | Freq: Once | INTRAMUSCULAR | Status: AC
  Administered 2017-10-23: 40 mg via INTRAVENOUS
  Filled 2017-10-23: qty 4

## 2017-10-23 MED ORDER — ALBUTEROL SULFATE (2.5 MG/3ML) 0.083% IN NEBU
5.0000 mg | INHALATION_SOLUTION | Freq: Once | RESPIRATORY_TRACT | Status: AC
  Administered 2017-10-23: 5 mg via RESPIRATORY_TRACT
  Filled 2017-10-23: qty 6

## 2017-10-23 MED ORDER — ONDANSETRON HCL 4 MG/2ML IJ SOLN: 4.0000 mg | Freq: Four times a day (QID) | INTRAMUSCULAR | Status: DC | PRN

## 2017-10-23 MED ORDER — FERROUS SULFATE 325 (65 FE) MG PO TABS
325.0000 mg | ORAL_TABLET | Freq: Two times a day (BID) | ORAL | Status: DC
  Administered 2017-10-24 – 2017-10-27 (×8): 325 mg via ORAL
  Filled 2017-10-23 (×8): qty 1

## 2017-10-23 MED ORDER — MONTELUKAST SODIUM 10 MG PO TABS
10.0000 mg | ORAL_TABLET | Freq: Every day | ORAL | Status: DC
  Administered 2017-10-23 – 2017-10-26 (×4): 10 mg via ORAL
  Filled 2017-10-23 (×4): qty 1

## 2017-10-23 MED ORDER — ACETAMINOPHEN 325 MG PO TABS
650.0000 mg | ORAL_TABLET | ORAL | Status: DC | PRN
  Administered 2017-10-25 – 2017-10-26 (×2): 650 mg via ORAL
  Filled 2017-10-23 (×2): qty 2

## 2017-10-23 MED ORDER — PREGABALIN 75 MG PO CAPS: 75.0000 mg | ORAL_CAPSULE | Freq: Two times a day (BID) | ORAL | Status: DC | PRN

## 2017-10-23 MED ORDER — FUROSEMIDE 10 MG/ML IJ SOLN
60.0000 mg | Freq: Two times a day (BID) | INTRAMUSCULAR | Status: DC
  Administered 2017-10-24: 60 mg via INTRAVENOUS
  Filled 2017-10-23: qty 6

## 2017-10-23 MED ORDER — LEVALBUTEROL HCL 1.25 MG/0.5ML IN NEBU: 1.2500 mg | INHALATION_SOLUTION | RESPIRATORY_TRACT | Status: DC | PRN

## 2017-10-23 MED ORDER — PRAVASTATIN SODIUM 40 MG PO TABS
40.0000 mg | ORAL_TABLET | Freq: Every evening | ORAL | Status: DC
  Administered 2017-10-23 – 2017-10-27 (×5): 40 mg via ORAL
  Filled 2017-10-23 (×5): qty 1

## 2017-10-23 MED ORDER — VITAMIN D (ERGOCALCIFEROL) 1.25 MG (50000 UNIT) PO CAPS: 50000.0000 [IU] | ORAL_CAPSULE | ORAL | Status: DC

## 2017-10-23 MED ORDER — LORATADINE 10 MG PO TABS: 10.0000 mg | ORAL_TABLET | Freq: Every day | ORAL | Status: DC

## 2017-10-23 MED ORDER — APIXABAN 5 MG PO TABS
5.0000 mg | ORAL_TABLET | Freq: Two times a day (BID) | ORAL | Status: DC
  Administered 2017-10-23 – 2017-10-24 (×2): 5 mg via ORAL
  Filled 2017-10-23 (×2): qty 1

## 2017-10-23 MED ORDER — INFLUENZA VAC SPLIT HIGH-DOSE 0.5 ML IM SUSY
0.5000 mL | PREFILLED_SYRINGE | INTRAMUSCULAR | Status: AC
  Administered 2017-10-24: 0.5 mL via INTRAMUSCULAR
  Filled 2017-10-23: qty 0.5

## 2017-10-23 MED ORDER — SALINE SPRAY 0.65 % NA SOLN: 1.0000 | NASAL | Status: DC | PRN

## 2017-10-23 MED ORDER — FLUTICASONE PROPIONATE 50 MCG/ACT NA SUSP
1.0000 | Freq: Every day | NASAL | Status: DC
  Administered 2017-10-24 – 2017-10-27 (×4): 1 via NASAL
  Filled 2017-10-23: qty 16

## 2017-10-23 MED ORDER — IPRATROPIUM BROMIDE 0.06 % NA SOLN
2.0000 | Freq: Two times a day (BID) | NASAL | Status: DC
  Administered 2017-10-24 – 2017-10-25 (×3): 2 via NASAL
  Filled 2017-10-23: qty 15

## 2017-10-23 MED ORDER — SODIUM CHLORIDE 0.9% FLUSH
3.0000 mL | Freq: Two times a day (BID) | INTRAVENOUS | Status: DC
  Administered 2017-10-23 – 2017-10-27 (×7): 3 mL via INTRAVENOUS

## 2017-10-23 MED ORDER — FAMOTIDINE 20 MG PO TABS
20.0000 mg | ORAL_TABLET | Freq: Every day | ORAL | Status: DC
  Administered 2017-10-23 – 2017-10-26 (×4): 20 mg via ORAL
  Filled 2017-10-23 (×4): qty 1

## 2017-10-23 MED ORDER — ALLOPURINOL 100 MG PO TABS
100.0000 mg | ORAL_TABLET | Freq: Every day | ORAL | Status: DC
  Administered 2017-10-24 – 2017-10-27 (×4): 100 mg via ORAL
  Filled 2017-10-23 (×4): qty 1

## 2017-10-23 MED ORDER — PANTOPRAZOLE SODIUM 40 MG PO TBEC
40.0000 mg | DELAYED_RELEASE_TABLET | Freq: Every day | ORAL | Status: DC
  Administered 2017-10-24 – 2017-10-27 (×4): 40 mg via ORAL
  Filled 2017-10-23 (×4): qty 1

## 2017-10-23 MED ORDER — PNEUMOCOCCAL VAC POLYVALENT 25 MCG/0.5ML IJ INJ
0.5000 mL | INJECTION | INTRAMUSCULAR | Status: AC
  Administered 2017-10-24: 0.5 mL via INTRAMUSCULAR
  Filled 2017-10-23: qty 0.5

## 2017-10-23 MED ORDER — INSULIN ASPART 100 UNIT/ML ~~LOC~~ SOLN
0.0000 [IU] | Freq: Three times a day (TID) | SUBCUTANEOUS | Status: DC
  Administered 2017-10-24 – 2017-10-25 (×3): 1 [IU] via SUBCUTANEOUS
  Administered 2017-10-26 – 2017-10-27 (×2): 2 [IU] via SUBCUTANEOUS

## 2017-10-23 MED ORDER — SODIUM CHLORIDE 0.9 % IV SOLN: 250.0000 mL | INTRAVENOUS | Status: DC | PRN

## 2017-10-23 MED ORDER — NYSTATIN 100000 UNIT/GM EX POWD
Freq: Three times a day (TID) | CUTANEOUS | Status: DC
  Administered 2017-10-23 – 2017-10-27 (×12): via TOPICAL
  Filled 2017-10-23: qty 15

## 2017-10-23 MED ORDER — LORATADINE 10 MG PO TABS
10.0000 mg | ORAL_TABLET | Freq: Every day | ORAL | Status: DC
  Administered 2017-10-24 – 2017-10-27 (×4): 10 mg via ORAL
  Filled 2017-10-23 (×4): qty 1

## 2017-10-23 MED ORDER — METOPROLOL SUCCINATE ER 25 MG PO TB24
25.0000 mg | ORAL_TABLET | Freq: Every morning | ORAL | Status: DC
  Administered 2017-10-24 – 2017-10-27 (×4): 25 mg via ORAL
  Filled 2017-10-23 (×4): qty 1

## 2017-10-23 MED ORDER — ADULT MULTIVITAMIN W/MINERALS CH
1.0000 | ORAL_TABLET | Freq: Every day | ORAL | Status: DC
  Administered 2017-10-24 – 2017-10-27 (×4): 1 via ORAL
  Filled 2017-10-23 (×4): qty 1

## 2017-10-23 MED ORDER — SODIUM CHLORIDE 0.9% FLUSH: 3.0000 mL | INTRAVENOUS | Status: DC | PRN

## 2017-10-23 NOTE — ED Triage Notes (Signed)
Pt brought in by daughter. Pt has been increasingly SOB for 2 weeks and increase edema in lower ext. Pt seen by cardiologist yesterday and had CXR performed . Pt was given extra lasix to take for 3 days. Daugter reports difficulty gettingOOB this morning so she brought to ED

## 2017-10-23 NOTE — ED Notes (Signed)
Have paged respiratory  

## 2017-10-23 NOTE — ED Notes (Signed)
CRITICAL VALUE ALERT  Critical Value:  Troponin 0.03   Date & Time Notied: 10/23/17  Provider Notified: Yes   Orders Received/Actions taken: No orders yet

## 2017-10-23 NOTE — H&P (Addendum)
History and Physical    [name] [name] PIR:[id number] DOB: April 26, 1930 DOA: 10/23/2017  PCP: Vidal Schwalbe, MD [facility]   Patient coming from: Home  I have personally briefly reviewed patient's old medical records in [place]  Chief Complaint: swelling  HPI: [name] [name] is a 84 y.o. female with medical history significant of CHF hypertension renal disease presents with edema.  Over the past 2 weeks patient has had increasing shortness of breath.  Over the past 2 days family has noticed she seemed more swollen all over from head to toe.  Patient was taken to her cardiologist In [place] by her family on yesterday and they increased her Lasix doses.  Higher Lasix dose was not given until today however.  Patient is otherwise compliant with her medications.  She denies any chest pain.  Patient had a chest x-ray today that showed marked cardiomegaly and vascular congestion.  Her echo in 2018 showed EF 45 to 63% with RV systolic dysfunction and diastolic dysfunction as well.She also has worsening renal function with Crt > 2 today with baseline around 1.5. She does complain of chronic stress incontinence. She has seen urology and has f/u appt scheduled.  ED Course: Chest x-ray showed moderate to marked  cardiomegaly and mild vascular congestion.  Patient was given IV Lasix and nebulizers with improvement of her shortness of breath  Review of Systems: Positive for feeling swollen all over, shortness of breath, orthopnea, stress incontinence All others reviewed with patient  and are  negative unless otherwise stated   Past Medical History:  Diagnosis Date  . Acid reflux   . ARF (acute renal failure) ([place]) 09/16/2010   Prerenal. Creatinine 1.36 before discharge.  . Atrial fibrillation ([place])   . CHF (congestive heart failure) (HCC)    Hx of diastolic CHF.  Marland Kitchen Chronic anticoagulation   . CKD (chronic kidney disease) stage 3, GFR 30-59 ml/min (HCC)   . Diverticular hemorrhage  2011   Per colonoscopy  . Diverticulosis 2011   Per colonoscopy  . GI bleeding 08/03/2011   Not scoped but likely secondary to hemorrhoids or diverticulosis  . Gout   . Hx of medication noncompliance   . Hypercholesterolemia   . Hyperglycemia    Query DM 2  . Hypertension   . Internal hemorrhoids 2011   Per colonoscopy  . Seasonal allergies   . Thrombocytopenia due to drugs    Allopurinol    Past Surgical History:  Procedure Laterality Date  . ADENOIDECTOMY    . APPENDECTOMY    . BUNIONECTOMY    . CATARACT EXTRACTION    . CESAREAN SECTION    . ESOPHAGOGASTRODUODENOSCOPY  08/20/09   small hiatal hernia/mild gastritis  . ileocolonoscopy  08/20/09   normal terminal ileum/pancolonic diverticula/no polyps/benign colon mucosa  . JOINT REPLACEMENT     Total right knee 2007  . LAPAROTOMY  09/22/2010   Procedure: EXPLORATORY LAPAROTOMY;  Surgeon: [name];  Location: [facility];  Service: General;  Laterality: N/A;  Lysis of Adhesions  . TONSILLECTOMY    . TUBAL LIGATION       reports that she has never smoked. She has never used smokeless tobacco. She reports that she does not drink alcohol or use drugs.  Allergies  Allergen Reactions  . Codeine Nausea And Vomiting  . Sulfa Antibiotics Nausea And Vomiting  . Sulfur Nausea And Vomiting    Family History  Problem Relation Age of Onset  . Heart disease Mother   .  Diabetes Mother      Prior to Admission medications   Medication Sig Start Date End Date Taking? Authorizing Provider  allopurinol (ZYLOPRIM) 100 MG tablet Take 100 mg by mouth daily.   Yes [provider]  apixaban (ELIQUIS) 5 MG TABS tablet Take 5 mg by mouth 2 (two) times daily.   Yes [provider]  Artificial Tear Ointment (DRY EYES OP) Place 1 drop into both eyes as needed (dry eyes).   Yes [provider]  Azelastine HCl 137 MCG/SPRAY SOLN Place 1-2 sprays into the nose daily as needed.  09/30/17  Yes [provider]    cetirizine (ZYRTEC) 10 MG tablet Take 1 tablet (10 mg total) by mouth daily. 09/10/17  Yes Valentina Shaggy, MD  ferrous sulfate 325 (65 FE) MG tablet Take 325 mg by mouth 2 (two) times daily with a meal.    Yes [provider]  fluticasone (FLONASE) 50 MCG/ACT nasal spray Place 1 spray into both nostrils daily.   Yes [provider]  furosemide (LASIX) 40 MG tablet Take 1 tablet (40 mg total) by mouth daily. Patient taking differently: Take 40 mg by mouth 2 (two) times daily.  04/01/16  Yes Isaac Bliss, Rayford Halsted, MD  ipratropium (ATROVENT) 0.06 % nasal spray Place 2 sprays into both nostrils 2 (two) times daily at 10 AM and 5 PM.   Yes [provider]  levalbuterol (XOPENEX) 1.25 MG/3ML nebulizer solution Take 1.25 mg by nebulization every 4 (four) hours as needed for wheezing. 09/10/17  Yes Valentina Shaggy, MD  lisinopril (PRINIVIL,ZESTRIL) 10 MG tablet Take 10 mg by mouth daily.   Yes [provider]  loratadine (CLARITIN) 10 MG tablet Take 10 mg by mouth daily.   Yes [provider]  metoprolol succinate (TOPROL-XL) 25 MG 24 hr tablet Take 25 mg by mouth every morning.   Yes [provider]  montelukast (SINGULAIR) 10 MG tablet Take 10 mg by mouth at bedtime.    Yes [provider]  Multiple Vitamin (MULTIVITAMIN WITH MINERALS) TABS tablet Take 1 tablet by mouth daily.   Yes [provider]  omeprazole (PRILOSEC) 20 MG capsule Take 1 capsule (20 mg total) by mouth daily. Patient taking differently: Take 20 mg by mouth every morning.  06/19/16  Yes Maczis, Barth Kirks, PA-C  potassium chloride SA (K-DUR,KLOR-CON) 20 MEQ tablet Take 1 tablet (20 mEq total) by mouth daily. 04/01/16  Yes Isaac Bliss, Rayford Halsted, MD  pravastatin (PRAVACHOL) 40 MG tablet Take 40 mg by mouth every evening.    Yes [provider]  pregabalin (LYRICA) 75 MG capsule Take 75 mg by mouth 2 (two) times daily as needed (for pain in  feet).  12/31/16  Yes [provider]  ranitidine (ZANTAC) 150 MG tablet Take 150 mg by mouth at bedtime.  02/03/16  Yes [provider]  sodium chloride (OCEAN) 0.65 % SOLN nasal spray Place 1 spray into both nostrils as needed for congestion.   Yes [provider]  Vitamin D, Ergocalciferol, (DRISDOL) 50000 units CAPS capsule Take 50,000 Units by mouth every 30 (thirty) days. Administered on the 1st of each month   Yes [provider]    Physical Exam: Vitals:   10/23/17 1555 10/23/17 1600 10/23/17 1700 10/23/17 1715  BP:  (!) 118/54 (!) 99/47   Pulse:  64 (!) 56 (!) 42  Resp:  (!) 28 19   Temp:      TempSrc:  SpO2: 98% 100% 99% 97%  Weight:        Constitutional: NAD, calm, comfortable Vitals:   10/23/17 1555 10/23/17 1600 10/23/17 1700 10/23/17 1715  BP:  (!) 118/54 (!) 99/47   Pulse:  64 (!) 56 (!) 42  Resp:  (!) 28 19   Temp:      TempSrc:      SpO2: 98% 100% 99% 97%  Weight:       Eyes: PERRL, lids and conjunctivae normal ENMT: Mucous membranes are moist. Posterior pharynx clear of any exudate or lesions.Normal dentition.  Neck: normal, supple, no masses,  Respiratory: Few scattered rales  Normal respiratory effort. No accessory muscle use.  Cardiovascular: Regular rate and irregularly irregular rhythm, no murmurs / rubs / gallops.  1+ lower  extremity edema.  Palpable pedal pulses. .  Abdomen: no tenderness, no masses palpated. No hepatosplenomegaly. Bowel sounds positive.  Obese Musculoskeletal: no clubbing / cyanosis. No joint deformity upper and lower extremities. Good ROM, no contractures. Normal muscle tone.  Skin: no rashes, lesions, ulcers. No induration Neurologic: CN 2-12 grossly intact. . Strength 5/5 in all 4.  Psychiatric: Normal judgment and insight. Alert and oriented x 3. Normal mood.  Positive generalized weakness    Labs on Admission: I have personally reviewed following labs and imaging studies  CBC: Recent  Labs  Lab 10/23/17 1506  WBC 5.3  NEUTROABS 3.2  HGB 11.3*  HCT 36.2  MCV 93.1  PLT 742*   Basic Metabolic Panel: Recent Labs  Lab 10/23/17 1506  NA 141  K 5.5*  CL 114*  CO2 22  GLUCOSE 169*  BUN 68*  CREATININE 2.62*  CALCIUM 8.6*   GFR: Estimated Creatinine Clearance: 16.9 mL/min (A) (by C-G formula based on SCr of 2.62 mg/dL (H)). Liver Function Tests: Recent Labs  Lab 10/23/17 1506  AST 30  ALT 14  ALKPHOS 83  BILITOT 1.3*  PROT 6.6  ALBUMIN 3.9   No results for input(s): LIPASE, AMYLASE in the last 168 hours. No results for input(s): AMMONIA in the last 168 hours. Coagulation Profile: No results for input(s): INR, PROTIME in the last 168 hours. Cardiac Enzymes: Recent Labs  Lab 10/23/17 1506  TROPONINI 0.03*   BNP (last 3 results) No results for input(s): PROBNP in the last 8760 hours. HbA1C: No results for input(s): HGBA1C in the last 72 hours. CBG: No results for input(s): GLUCAP in the last 168 hours. Lipid Profile: No results for input(s): CHOL, HDL, LDLCALC, TRIG, CHOLHDL, LDLDIRECT in the last 72 hours. Thyroid Function Tests: No results for input(s): TSH, T4TOTAL, FREET4, T3FREE, THYROIDAB in the last 72 hours. Anemia Panel: No results for input(s): VITAMINB12, FOLATE, FERRITIN, TIBC, IRON, RETICCTPCT in the last 72 hours. Urine analysis:    Component Value Date/Time   COLORURINE YELLOW 06/01/2017 2031   APPEARANCEUR CLEAR 06/01/2017 2031   LABSPEC 1.017 06/01/2017 2031   PHURINE 5.0 06/01/2017 2031   GLUCOSEU NEGATIVE 06/01/2017 2031   HGBUR NEGATIVE 06/01/2017 2031   BILIRUBINUR NEGATIVE 06/01/2017 2031   [place] NEGATIVE 06/01/2017 2031   PROTEINUR 100 (A) 06/01/2017 2031   UROBILINOGEN 0.2 04/09/2014 2255   NITRITE NEGATIVE 06/01/2017 2031   LEUKOCYTESUR NEGATIVE 06/01/2017 2031    Radiological Exams on Admission: Dg Chest 2 View  Result Date: 10/23/2017 CLINICAL DATA:  Shortness of breath EXAM: CHEST - 2 VIEW  COMPARISON:  06/01/2017, 08/14/2016 FINDINGS: Moderate cardiomegaly with globular cardiac configuration. Vascular congestion. Streaky scarring or atelectasis in the right mid lung.  No pleural effusion. Degenerative changes of the spine. Aortic atherosclerosis. IMPRESSION: 1. Moderate to marked cardiomegaly, consider pericardial effusion given configuration. Mild vascular congestion. Electronically Signed   By: Donavan Foil M.D.   On: 10/23/2017 15:54    EKG: Independently reviewed.  A. fib rate of 71 beats otherwise normal intervals abnormal EKG  Assessment/Plan Principal Problem:   Acute on chronic combined systolic and diastolic CHF (congestive heart failure) (HCC) Active Problems:   Chronic a-fib (HCC)   Elevated troponin I level   DM type 2 (diabetes mellitus, type 2) (HCC)   Acute on chronic renal failure (HCC)   Hyperkalemia   -Admit to telemetry, IV Lasix, follow ins and outs and daily weights, restrict fluids.  Cycle cardiac enzymes.  Supplemental oxygen to maintain oxygen sats as needed, check 2D echo.  No Nitropaste as blood pressure is low normal at this time.  Continue beta-blocker hold ACE inhibitor in light of worsening renal function consult cardiology -Check urinalysis hold ACE inhibitor -Eliquis and beta-blocker for chronic A. fib rate controlled at this time -Carb consistent diet, sliding scale insulin , check hemoglobin A1c   DVT prophylaxis: Continue Eliquis Code Status: Full Family Communication: Discuss case with family and daughter Kennyth Lose at bedside  disposition Plan: Home 2 days  admission status: Observation telemetry   Karam Dunson Johnson-Pitts MD [facility] Pager [phone]  If 7PM-7AM, please contact night-coverage [email or web address] Password [facility]  10/23/2017, 5:43 PM

## 2017-10-23 NOTE — ED Provider Notes (Signed)
[facility] EMERGENCY DEPARTMENT Provider Note   CSN: [id number] Arrival date & time: 10/23/17  1416     History   Chief Complaint Chief Complaint  Patient presents with  . Shortness of Breath    HPI [name] [name] is a 84 y.o. female.  HPI  Patient presents with her daughter who assists with the HPI. Patient has multiple medical issues including CHF, CKD, A. fib. Patient has recently been on 20 mg of Lasix, twice daily, but as of yesterday had an increase in her dosing to 40 mg twice daily. This occurred after consultation with her physician due to worsening dyspnea, fatigue and diffuse edema, occurring over the past week. She notes that in spite of these changes, since yesterday, she has persistent dyspnea, fatigue, edema. She denies focal pain, fever.  When there is cough, wheezing, audible and uncomfortable. Beyond that the Lasix change no other recent medication change, diet change, activity change. Symptoms are worse with activity, minimally better at rest.  Past Medical History:  Diagnosis Date  . Acid reflux   . ARF (acute renal failure) ([place]) 09/16/2010   Prerenal. Creatinine 1.36 before discharge.  . Atrial fibrillation ([place])   . CHF (congestive heart failure) (HCC)    Hx of diastolic CHF.  Marland Kitchen Chronic anticoagulation   . CKD (chronic kidney disease) stage 3, GFR 30-59 ml/min (HCC)   . Diverticular hemorrhage 2011   Per colonoscopy  . Diverticulosis 2011   Per colonoscopy  . GI bleeding 08/03/2011   Not scoped but likely secondary to hemorrhoids or diverticulosis  . Gout   . Hx of medication noncompliance   . Hypercholesterolemia   . Hyperglycemia    Query DM 2  . Hypertension   . Internal hemorrhoids 2011   Per colonoscopy  . Seasonal allergies   . Thrombocytopenia due to drugs    Allopurinol    Patient Active Problem List   Diagnosis Date Noted  . Acute hypoxemic respiratory failure ([place]) 08/14/2016  . Hypertensive emergency 08/14/2016  . High  anion gap metabolic acidosis 45/80/9983  . Acute on chronic combined systolic and diastolic CHF (congestive heart failure) ([place]) 03/30/2016  . CKD (chronic kidney disease), stage III ([place]) 03/30/2016  . Hypertension 03/30/2016  . Febrile illness   . Back pain 04/10/2014  . Fever 04/10/2014  . GI bleeding 08/03/2011  . HTN (hypertension), malignant 08/03/2011  . Diverticulosis 08/03/2011  . Internal hemorrhoids 08/03/2011  . Hyperglycemia 08/03/2011  . Leukocytosis (leucocytosis) 09/25/2010  . Borderline abnormal TFTs 09/21/2010  . Hypocalcemia 09/18/2010  . Hypernatremia 09/18/2010  . Gout attack 09/18/2010  . Hypotension 09/17/2010  . Thrombocytopenia ([place]) 09/17/2010  . DM type 2 (diabetes mellitus, type 2) ([place]) 09/17/2010  . Hypomagnesemia 09/17/2010  . SBO (small bowel obstruction) ([place]) 09/16/2010  . Chronic kidney disease (CKD), stage IV (severe) ([place]) 09/16/2010  . Hypokalemia 09/16/2010  . Chronic a-fib ([place]) 09/16/2010  . Warfarin anticoagulation 09/16/2010  . Elevated troponin I level 09/16/2010    Past Surgical History:  Procedure Laterality Date  . ADENOIDECTOMY    . APPENDECTOMY    . BUNIONECTOMY    . CATARACT EXTRACTION    . CESAREAN SECTION    . ESOPHAGOGASTRODUODENOSCOPY  08/20/09   small hiatal hernia/mild gastritis  . ileocolonoscopy  08/20/09   normal terminal ileum/pancolonic diverticula/no polyps/benign colon mucosa  . JOINT REPLACEMENT     Total right knee 2007  . LAPAROTOMY  09/22/2010   Procedure: EXPLORATORY LAPAROTOMY;  Surgeon: [name];  Location: [facility];  Service: General;  Laterality: N/A;  Lysis of Adhesions  . TONSILLECTOMY    . TUBAL LIGATION       OB History    Gravida  4   Para  4   Term  3   Preterm  1   AB      Living  3     SAB      TAB      Ectopic      Multiple      Live Births               Home Medications    Prior to Admission medications   Medication Sig Start Date End Date Taking?  Authorizing Provider  allopurinol (ZYLOPRIM) 100 MG tablet Take 100 mg by mouth daily.    [provider]  apixaban (ELIQUIS) 5 MG TABS tablet Take 5 mg by mouth 2 (two) times daily.    [provider]  Artificial Tear Ointment (DRY EYES OP) Place 1 drop into both eyes as needed (dry eyes).    [provider]  carvedilol (COREG) 12.5 MG tablet Take 1 tablet (12.5 mg total) by mouth 2 (two) times daily with a meal. Take two tablets two times daily 06/01/17   Pattricia Boss, MD  cetirizine (ZYRTEC) 10 MG tablet Take 1 tablet (10 mg total) by mouth daily. 09/10/17   Valentina Shaggy, MD  ferrous sulfate 325 (65 FE) MG tablet Take 325 mg by mouth 2 (two) times daily with a meal.     [provider]  fluticasone (FLONASE) 50 MCG/ACT nasal spray Place 1 spray into both nostrils daily.    [provider]  furosemide (LASIX) 40 MG tablet Take 1 tablet (40 mg total) by mouth daily. Patient taking differently: Take 20 mg by mouth daily.  04/01/16   Isaac Bliss, Rayford Halsted, MD  ipratropium (ATROVENT) 0.06 % nasal spray Place 2 sprays into both nostrils 2 (two) times daily at 10 AM and 5 PM.    [provider]  levalbuterol (XOPENEX) 1.25 MG/3ML nebulizer solution Take 1.25 mg by nebulization every 4 (four) hours as needed for wheezing. 09/10/17   Valentina Shaggy, MD  lisinopril (PRINIVIL,ZESTRIL) 10 MG tablet Take 10 mg by mouth daily.    [provider]  loratadine (CLARITIN) 10 MG tablet Take 10 mg by mouth daily.    [provider]  montelukast (SINGULAIR) 10 MG tablet Take 10 mg by mouth at bedtime.     [provider]  Multiple Vitamin (MULTIVITAMIN WITH MINERALS) TABS tablet Take 1 tablet by mouth daily.    [provider]  omeprazole (PRILOSEC) 20 MG capsule Take 1 capsule (20 mg total) by mouth daily. 06/19/16   Maczis, Barth Kirks, PA-C  potassium chloride SA (K-DUR,KLOR-CON) 20 MEQ tablet Take 1 tablet (20  mEq total) by mouth daily. 04/01/16   Isaac Bliss, Rayford Halsted, MD  pravastatin (PRAVACHOL) 40 MG tablet Take 40 mg by mouth every evening.     [provider]  ranitidine (ZANTAC) 150 MG tablet Take 150 mg by mouth daily.  02/03/16   [provider]  sodium chloride (OCEAN) 0.65 % SOLN nasal spray Place 1 spray into both nostrils as needed for congestion.    [provider]  Vitamin D, Ergocalciferol, (DRISDOL) 50000 units CAPS capsule Take 50,000 Units by mouth every 30 (thirty) days. Administered on the 1st of each month    [provider]  Family History Family History  Problem Relation Age of Onset  . Heart disease Mother   . Diabetes Mother     Social History Social History   Tobacco Use  . Smoking status: Never Smoker  . Smokeless tobacco: Never Used  Substance Use Topics  . Alcohol use: No  . Drug use: No     Allergies   Codeine; Sulfa antibiotics; and Sulfur   Review of Systems Review of Systems  Constitutional:       Per HPI, otherwise negative  HENT:       Per HPI, otherwise negative  Respiratory:       Per HPI, otherwise negative  Cardiovascular:       Per HPI, otherwise negative  Gastrointestinal: Negative for vomiting.  Endocrine:       Negative aside from HPI  Genitourinary:       Neg aside from HPI   Musculoskeletal:       Per HPI, otherwise negative  Skin: Negative.   Neurological: Negative for syncope.     Physical Exam Updated Vital Signs BP 108/63   Pulse 75   Temp 98 F (36.7 C) (Oral)   Resp 20   Wt 93 kg   SpO2 98%   BMI 36.90 kg/m   Physical Exam  Constitutional: She is oriented to person, place, and time. She has a sickly appearance. No distress.  HENT:  Head: Normocephalic and atraumatic.  Eyes: Conjunctivae and EOM are normal.  Cardiovascular: An irregularly irregular rhythm present. Tachycardia present.  Pulmonary/Chest: Accessory muscle usage present. No stridor. Tachypnea noted.  She has decreased breath sounds. She has wheezes. She has rhonchi.  Abdominal: She exhibits no distension.  Musculoskeletal: She exhibits edema.  Neurological: She is alert and oriented to person, place, and time. No cranial nerve deficit.  Skin: Skin is warm and dry.  Psychiatric: She has a normal mood and affect.  Nursing note and vitals reviewed.    ED Treatments / Results  Labs (all labs ordered are listed, but only abnormal results are displayed) Labs Reviewed  COMPREHENSIVE METABOLIC PANEL - Abnormal; Notable for the following components:      Result Value   Potassium 5.5 (*)    Chloride 114 (*)    Glucose, Bld 169 (*)    BUN 68 (*)    Creatinine, Ser 2.62 (*)    Calcium 8.6 (*)    Total Bilirubin 1.3 (*)    GFR calc non Af Amer 16 (*)    GFR calc Af Amer 18 (*)    All other components within normal limits  CBC WITH DIFFERENTIAL/PLATELET - Abnormal; Notable for the following components:   Hemoglobin 11.3 (*)    RDW 17.4 (*)    Platelets 103 (*)    All other components within normal limits  TROPONIN I - Abnormal; Notable for the following components:   Troponin I 0.03 (*)    All other components within normal limits  BRAIN NATRIURETIC PEPTIDE - Abnormal; Notable for the following components:   B Natriuretic Peptide 314.0 (*)    All other components within normal limits    EKG EKG Interpretation  Date/Time:  Wednesday October 23 2017 14:33:42 EDT Ventricular Rate:  79 PR Interval:    QRS Duration: 93 QT Interval:  360 QTC Calculation: 413 R Axis:   119 Text Interpretation:  Atrial fibrillation Right axis deviation Low voltage, precordial leads Baseline wander in lead(s) V2 Abnormal ekg Confirmed by Carmin Muskrat [phone]) on 10/23/2017 4:07:01  PM   Radiology Dg Chest 2 View  Result Date: 10/23/2017 CLINICAL DATA:  Shortness of breath EXAM: CHEST - 2 VIEW COMPARISON:  06/01/2017, 08/14/2016 FINDINGS: Moderate cardiomegaly with globular cardiac configuration.  Vascular congestion. Streaky scarring or atelectasis in the right mid lung. No pleural effusion. Degenerative changes of the spine. Aortic atherosclerosis. IMPRESSION: 1. Moderate to marked cardiomegaly, consider pericardial effusion given configuration. Mild vascular congestion. Electronically Signed   By: Donavan Foil M.D.   On: 10/23/2017 15:54    Procedures Procedures (including critical care time)  Medications Ordered in ED Medications  albuterol (PROVENTIL) (2.5 MG/3ML) 0.083% nebulizer solution 5 mg (5 mg Nebulization Given 10/23/17 1555)  furosemide (LASIX) injection 40 mg (40 mg Intravenous Given 10/23/17 1549)     Initial Impression / Assessment and Plan / ED Course  I have reviewed the triage vital signs and the nursing notes.  Pertinent labs & imaging results that were available during my care of the patient were reviewed by me and considered in my medical decision making (see chart for details).    After the initial evaluation with consideration of heart failure exacerbation the patient received IV Lasix and breathing treatment.  4:07 PM On repeat exam the patient is in similar condition. Labs notable for elevated BNP, and with audible wheezing, abnormal chest x-ray, there is suspicion for heart failure exacerbation. Patient has received bronchodilator and Lasix, IV, but with acute kidney injury, management will require admission, for continuous monitoring, medication adjustments. Patient remains afebrile, awake and alert, there is low suspicion for concurrent infection.   Final Clinical Impressions(s) / ED Diagnoses  Acute heart failure exacerbation   Carmin Muskrat, MD 10/23/17 1610

## 2017-10-24 ENCOUNTER — Observation Stay (HOSPITAL_BASED_OUTPATIENT_CLINIC_OR_DEPARTMENT_OTHER): Payer: Medicare Other

## 2017-10-24 DIAGNOSIS — I340 Nonrheumatic mitral (valve) insufficiency: Secondary | ICD-10-CM

## 2017-10-24 DIAGNOSIS — I5043 Acute on chronic combined systolic (congestive) and diastolic (congestive) heart failure: Secondary | ICD-10-CM | POA: Diagnosis present

## 2017-10-24 DIAGNOSIS — Z79899 Other long term (current) drug therapy: Secondary | ICD-10-CM | POA: Diagnosis not present

## 2017-10-24 DIAGNOSIS — Z96651 Presence of right artificial knee joint: Secondary | ICD-10-CM | POA: Diagnosis present

## 2017-10-24 DIAGNOSIS — N183 Chronic kidney disease, stage 3 (moderate): Secondary | ICD-10-CM | POA: Diagnosis present

## 2017-10-24 DIAGNOSIS — R7989 Other specified abnormal findings of blood chemistry: Secondary | ICD-10-CM | POA: Diagnosis present

## 2017-10-24 DIAGNOSIS — Z885 Allergy status to narcotic agent status: Secondary | ICD-10-CM | POA: Diagnosis not present

## 2017-10-24 DIAGNOSIS — E7800 Pure hypercholesterolemia, unspecified: Secondary | ICD-10-CM | POA: Diagnosis present

## 2017-10-24 DIAGNOSIS — I130 Hypertensive heart and chronic kidney disease with heart failure and stage 1 through stage 4 chronic kidney disease, or unspecified chronic kidney disease: Secondary | ICD-10-CM | POA: Diagnosis present

## 2017-10-24 DIAGNOSIS — Z23 Encounter for immunization: Secondary | ICD-10-CM | POA: Diagnosis present

## 2017-10-24 DIAGNOSIS — Z882 Allergy status to sulfonamides status: Secondary | ICD-10-CM | POA: Diagnosis not present

## 2017-10-24 DIAGNOSIS — K219 Gastro-esophageal reflux disease without esophagitis: Secondary | ICD-10-CM | POA: Diagnosis present

## 2017-10-24 DIAGNOSIS — Z7901 Long term (current) use of anticoagulants: Secondary | ICD-10-CM | POA: Diagnosis not present

## 2017-10-24 DIAGNOSIS — Z888 Allergy status to other drugs, medicaments and biological substances status: Secondary | ICD-10-CM | POA: Diagnosis not present

## 2017-10-24 DIAGNOSIS — N179 Acute kidney failure, unspecified: Secondary | ICD-10-CM | POA: Diagnosis present

## 2017-10-24 DIAGNOSIS — I482 Chronic atrial fibrillation: Secondary | ICD-10-CM | POA: Diagnosis present

## 2017-10-24 DIAGNOSIS — E1122 Type 2 diabetes mellitus with diabetic chronic kidney disease: Secondary | ICD-10-CM | POA: Diagnosis present

## 2017-10-24 DIAGNOSIS — E875 Hyperkalemia: Secondary | ICD-10-CM | POA: Diagnosis present

## 2017-10-24 DIAGNOSIS — N393 Stress incontinence (female) (male): Secondary | ICD-10-CM | POA: Diagnosis present

## 2017-10-24 LAB — BASIC METABOLIC PANEL
Anion gap: 8 (ref 5–15)
BUN: 65 mg/dL — ABNORMAL HIGH (ref 8–23)
CO2: 24 mmol/L (ref 22–32)
Calcium: 8.8 mg/dL — ABNORMAL LOW (ref 8.9–10.3)
Chloride: 112 mmol/L — ABNORMAL HIGH (ref 98–111)
Creatinine, Ser: 2.43 mg/dL — ABNORMAL HIGH (ref 0.44–1.00)
GFR calc Af Amer: 20 mL/min — ABNORMAL LOW (ref >60)
GFR calc non Af Amer: 17 mL/min — ABNORMAL LOW (ref >60)
Glucose, Bld: 121 mg/dL — ABNORMAL HIGH (ref 70–99)
Potassium: 4.6 mmol/L (ref 3.5–5.1)
Sodium: 144 mmol/L (ref 135–145)

## 2017-10-24 LAB — GLUCOSE, CAPILLARY
Glucose-Capillary: 115 mg/dL — ABNORMAL HIGH (ref 70–99)
Glucose-Capillary: 138 mg/dL — ABNORMAL HIGH (ref 70–99)
Glucose-Capillary: 127 mg/dL — ABNORMAL HIGH (ref 70–99)
Glucose-Capillary: 106 mg/dL — ABNORMAL HIGH (ref 70–99)
Glucose-Capillary: 153 mg/dL — ABNORMAL HIGH (ref 70–99)

## 2017-10-24 LAB — URINALYSIS, ROUTINE W REFLEX MICROSCOPIC
Bilirubin Urine: NEGATIVE
Glucose, UA: NEGATIVE mg/dL
Ketones, ur: NEGATIVE mg/dL
Leukocytes, UA: NEGATIVE
Nitrite: NEGATIVE
Protein, ur: NEGATIVE mg/dL
Specific Gravity, Urine: 1.008 (ref 1.005–1.030)
pH: 5 (ref 5.0–8.0)

## 2017-10-24 LAB — HEMOGLOBIN A1C
Hgb A1c MFr Bld: 7.9 % — ABNORMAL HIGH (ref 4.8–5.6)
Mean Plasma Glucose: 180.03 mg/dL

## 2017-10-24 LAB — ECHOCARDIOGRAM COMPLETE
Height: 63 in
Weight: 3167.57 oz

## 2017-10-24 LAB — TROPONIN I: Troponin I: 0.03 ng/mL (ref <0.03)

## 2017-10-24 MED ORDER — FUROSEMIDE 10 MG/ML IJ SOLN
40.0000 mg | Freq: Two times a day (BID) | INTRAMUSCULAR | Status: DC
  Administered 2017-10-24 – 2017-10-25 (×3): 40 mg via INTRAVENOUS
  Filled 2017-10-24 (×3): qty 4

## 2017-10-24 MED ORDER — GLIMEPIRIDE 2 MG PO TABS
2.0000 mg | ORAL_TABLET | Freq: Every day | ORAL | Status: DC
  Administered 2017-10-25 – 2017-10-27 (×3): 2 mg via ORAL
  Filled 2017-10-24 (×3): qty 1

## 2017-10-24 MED ORDER — APIXABAN 2.5 MG PO TABS
2.5000 mg | ORAL_TABLET | Freq: Two times a day (BID) | ORAL | Status: DC
  Administered 2017-10-24 – 2017-10-27 (×6): 2.5 mg via ORAL
  Filled 2017-10-24 (×6): qty 1

## 2017-10-24 NOTE — Progress Notes (Signed)
Patient Demographics:    [name] [name], is a 84 y.o. female, DOB - December 27, 1930, ESP:[id number]  Admit date - 10/23/2017   Admitting Physician Shelbie Proctor, MD  Outpatient Primary MD for the patient is Vidal Schwalbe, MD  LOS - 0   Chief Complaint  Patient presents with  . Shortness of Breath        Subjective:    [name] [name] today has no fevers, no emesis,  No chest pain,  Sob is better, dyspnea on exertion persist, no chest pains  Assessment  & Plan :    Principal Problem:   Acute on chronic combined systolic and diastolic CHF (congestive heart failure) (HCC) Active Problems:   Chronic a-fib (HCC)   Elevated troponin I level   DM type 2 (diabetes mellitus, type 2) (HCC)   CHF (congestive heart failure) (HCC)   Acute on chronic renal failure (HCC)   Hyperkalemia  Brief Summary:- 84 year old with past medical history relevant for combined systolic and diastolic dysfunction CHF, CKD stage III, chronic A. fib diabetes and hypertension admitted on 10/23/2017 with worsening dyspnea and lower extremity edema consistent with CHF flareup.   Plan:- 1)HFrEF--- patient with history of combined systolic and diastolic dysfunction CHF, continues to have dyspnea on minimal exertion, change IV Lasix to 40 mg IV every 12 hours, daily weights and fluid input and output monitoring, echo without pericardial effusion and EF 45 to 50%, troponins are flat, no evidence of frank ACS  2)AKI----acute kidney injury on CKD stage - III    creatinine on admission= 2.62  ,   baseline creatinine = 1.5   , creatinine is now= 2.43      , renally adjust medications, avoid nephrotoxic agents/dehydration/hypotension, hold lisinopril due to kidney concerns  3) chronic atrial fibrillation-stable, continue apixaban for anticoagulation, Toprol-XL 25 mg daily for rate control  4)DM2-not well controlled, A1c 7.9, give Amaryl 2 mg  p.o. every morning with breakfast, use Novolog/Humalog Sliding scale insulin with Accu-Cheks/Fingersticks as ordered    Disposition/Need for in-Hospital Stay- patient unable to be discharged at this time due to CHF flareup with dyspnea on exertion and volume overload, requiring IV diuretics as well as worsening renal function requiring close monitoring  Code Status : Full    Disposition Plan  : Home    DVT Prophylaxis  :  Apixaban  Lab Results  Component Value Date   PLT 103 (L) 10/23/2017    Inpatient Medications  Scheduled Meds: . allopurinol  100 mg Oral Daily  . apixaban  2.5 mg Oral BID  . famotidine  20 mg Oral QHS  . ferrous sulfate  325 mg Oral BID WC  . fluticasone  1 spray Each Nare Daily  . furosemide  40 mg Intravenous Q12H  . insulin aspart  0-9 Units Subcutaneous TID WC  . ipratropium  2 spray Each Nare BID  . loratadine  10 mg Oral Daily  . metoprolol succinate  25 mg Oral q morning - 10a  . montelukast  10 mg Oral QHS  . multivitamin with minerals  1 tablet Oral Daily  . nystatin   Topical TID  . pantoprazole  40 mg Oral Daily  . pravastatin  40 mg Oral QPM  . sodium chloride flush  3 mL Intravenous Q12H  . [START ON 11/05/2017] Vitamin D (Ergocalciferol)  50,000 Units Oral Q30 days   Continuous Infusions: . sodium chloride     PRN Meds:.sodium chloride, acetaminophen, levalbuterol, ondansetron (ZOFRAN) IV, pregabalin, sodium chloride, sodium chloride flush    Anti-infectives (From admission, onward)   None        Objective:   Vitals:   10/23/17 1915 10/24/17 0400 10/24/17 0435 10/24/17 1412  BP: (!) 108/49  131/64 (!) 143/73  Pulse: 76  75 69  Resp: 20  18 16   Temp: 97.9 F (36.6 C)  97.8 F (36.6 C) 97.7 F (36.5 C)  TempSrc: Oral  Oral Oral  SpO2: 97%  96% 96%  Weight: 90.9 kg 89.8 kg    Height: 5\' 3"  (1.6 m)       Wt Readings from Last 3 Encounters:  10/24/17 89.8 kg  09/10/17 85.2 kg  06/01/17 79.4 kg     Intake/Output  Summary (Last 24 hours) at 10/24/2017 1603 Last data filed at 10/24/2017 1100 Gross per 24 hour  Intake 606 ml  Output 2550 ml  Net -1944 ml     Physical Exam Patient is examined daily including today on 10/24/17 , exams remain the same as of yesterday except that has changed   Gen:- Awake Alert,  In no apparent distress  HEENT:- [place].AT, No sclera icterus Neck-Supple Neck,No JVD,.  Lungs-diminished in bases with faint bibasilar rales CV- S1, S2 normal, irregularly irregular Abd-  +ve B.Sounds, Abd Soft, No tenderness,    Extremity/Skin:-1+ pitting edema,   good pulses Psych-affect is appropriate, oriented x3 Neuro-no new focal deficits, no tremors   Data Review:   Micro Results No results found for this or any previous visit (from the past 240 hour(s)).  Radiology Reports Dg Chest 2 View  Result Date: 10/23/2017 CLINICAL DATA:  Shortness of breath EXAM: CHEST - 2 VIEW COMPARISON:  06/01/2017, 08/14/2016 FINDINGS: Moderate cardiomegaly with globular cardiac configuration. Vascular congestion. Streaky scarring or atelectasis in the right mid lung. No pleural effusion. Degenerative changes of the spine. Aortic atherosclerosis. IMPRESSION: 1. Moderate to marked cardiomegaly, consider pericardial effusion given configuration. Mild vascular congestion. Electronically Signed   By: Donavan Foil M.D.   On: 10/23/2017 15:54     CBC Recent Labs  Lab 10/23/17 1506  WBC 5.3  HGB 11.3*  HCT 36.2  PLT 103*  MCV 93.1  MCH 29.0  MCHC 31.2  RDW 17.4*  LYMPHSABS 1.1  MONOABS 0.7  EOSABS 0.3  BASOSABS 0.0    Chemistries  Recent Labs  Lab 10/23/17 1506 10/24/17 0502  NA 141 144  K 5.5* 4.6  CL 114* 112*  CO2 22 24  GLUCOSE 169* 121*  BUN 68* 65*  CREATININE 2.62* 2.43*  CALCIUM 8.6* 8.8*  AST 30  --   ALT 14  --   ALKPHOS 83  --   BILITOT 1.3*  --    ------------------------------------------------------------------------------------------------------------------ No  results for input(s): CHOL, HDL, LDLCALC, TRIG, CHOLHDL, LDLDIRECT in the last 72 hours.  Lab Results  Component Value Date   HGBA1C 7.9 (H) 10/23/2017   ------------------------------------------------------------------------------------------------------------------ No results for input(s): TSH, T4TOTAL, T3FREE, THYROIDAB in the last 72 hours.  Invalid input(s): FREET3 ------------------------------------------------------------------------------------------------------------------ No results for input(s): VITAMINB12, FOLATE, FERRITIN, TIBC, IRON, RETICCTPCT in the last 72 hours.  Coagulation profile No results for input(s): INR, PROTIME in the last 168 hours.  No results for input(s): DDIMER in the last 72 hours.  Cardiac Enzymes Recent Labs  Lab 10/23/17 1506 10/23/17 1753 10/23/17 2357  TROPONINI 0.03* 0.03* 0.03*   ------------------------------------------------------------------------------------------------------------------    Component Value Date/Time   BNP 314.0 (H) 10/23/2017 1449    Roxan Hockey M.D on 10/24/2017 at 4:03 PM  Pager---[phone] Go to [email or web address] - password [email or web address] for contact info  [facility] - Office  [phone]

## 2017-10-24 NOTE — Progress Notes (Signed)
*  PRELIMINARY RESULTS* Echocardiogram 2D Echocardiogram has been performed.  [name] 10/24/2017, 9:49 AM

## 2017-10-25 LAB — BASIC METABOLIC PANEL
Anion gap: 11 (ref 5–15)
BUN: 55 mg/dL — ABNORMAL HIGH (ref 8–23)
CO2: 30 mmol/L (ref 22–32)
Calcium: 9.2 mg/dL (ref 8.9–10.3)
Chloride: 104 mmol/L (ref 98–111)
Creatinine, Ser: 2.12 mg/dL — ABNORMAL HIGH (ref 0.44–1.00)
GFR calc Af Amer: 23 mL/min — ABNORMAL LOW (ref >60)
GFR calc non Af Amer: 20 mL/min — ABNORMAL LOW (ref >60)
Glucose, Bld: 129 mg/dL — ABNORMAL HIGH (ref 70–99)
Potassium: 3.9 mmol/L (ref 3.5–5.1)
Sodium: 145 mmol/L (ref 135–145)

## 2017-10-25 LAB — GLUCOSE, CAPILLARY
Glucose-Capillary: 131 mg/dL — ABNORMAL HIGH (ref 70–99)
Glucose-Capillary: 141 mg/dL — ABNORMAL HIGH (ref 70–99)
Glucose-Capillary: 64 mg/dL — ABNORMAL LOW (ref 70–99)
Glucose-Capillary: 98 mg/dL (ref 70–99)
Glucose-Capillary: 148 mg/dL — ABNORMAL HIGH (ref 70–99)

## 2017-10-25 MED ORDER — FUROSEMIDE 10 MG/ML IJ SOLN: 40.0000 mg | Freq: Every day | INTRAMUSCULAR | Status: DC

## 2017-10-25 MED ORDER — FUROSEMIDE 10 MG/ML IJ SOLN: 20.0000 mg | Freq: Once | INTRAMUSCULAR | Status: DC

## 2017-10-25 NOTE — Progress Notes (Signed)
Tele called and 3.17 second pause.  BP  139/67  Pulse 65.  Denies chest pain.  Texted Dr. Denton Brick

## 2017-10-25 NOTE — Care Management Important Message (Signed)
Important Message  Patient Details  Name: EUN VERMEER MRN: [id number] Date of Birth: [birth year]   Medicare Important Message Given:  Yes    Shelda Altes 10/25/2017, 10:32 AM

## 2017-10-25 NOTE — Care Management Note (Signed)
Case Management Note  Patient Details  Name: [name] [name] MRN: [id number] Date of Birth: May 29, 1930  Subjective/Objective:         Admitted with CHF. CM referral for CHF assessment. Chart reviewed for needs. Pt from home, lives with family, ind pta. She has insurance with drug coverage, PCP and transportation to appointments. She has PT eval pending. She is on room air. No documentation of non-compliance. Pt lives in [place]. This is pt's only admission in past 6 months, no ED visits.             Action/Plan: Bedside assessment pending PT eval results. CM will cont to follow.   Expected Discharge Date:       10/26/17           Expected Discharge Plan:  [place]  In-House Referral:     Discharge planning Services  CM Consult  Post Acute Care Choice:    Choice offered to:     DME Arranged:    DME Agency:     HH Arranged:    [place] Agency:     Status of Service:  In process, will continue to follow  If discussed at Long Length of Stay Meetings, dates discussed:    Additional Comments:  Sherald Barge, RN 10/25/2017, 2:59 PM

## 2017-10-25 NOTE — Evaluation (Signed)
Physical Therapy Evaluation Patient Details Name: [name] [name] MRN: [id number] DOB: 18-Jul-1930 Today's Date: 10/25/2017   History of Present Illness  84 yo female with onset of LE edema and pain was admitted, noted pericardial effusion, cardiomegaly, flat troponin values.  PMHx:  CHF, renal disease, CKD 3, Acute renal failure, EF 45-50% incontinent of urine, GI bleed, DM, gout,   Clinical Impression  Pt was seen for evaluation of mobility and is reluctant to even attempt it.  Pt was apparently not sleeping well and was surprised she was at the end of the day with poor sleep.  Talked with her about not staying in bed and trying to be active to sleep better at the end of the day.  Will see acutely for strengthening and balance, and transition to rehab for further work toward independence.    Follow Up Recommendations SNF    Equipment Recommendations  None recommended by PT    Recommendations for Other Services       Precautions / Restrictions Precautions Precautions: Fall Precaution Comments: telemetry Restrictions Weight Bearing Restrictions: No      Mobility  Bed Mobility Overal bed mobility: Needs Assistance Bed Mobility: Supine to Sit     Supine to sit: Mod assist     General bed mobility comments: mod assist to sit up with trunk support but assist to scoot to EOB as well  Transfers Overall transfer level: Needs assistance Equipment used: 1 person hand held assist Transfers: Sit to/from Stand Sit to Stand: Mod assist         General transfer comment: pt holds onto the bedrail to determine her assist needs  Ambulation/Gait             General Gait Details: unable  Stairs            Wheelchair Mobility    Modified Rankin (Stroke Patients Only)       Balance Overall balance assessment: Needs assistance Sitting-balance support: Feet supported;Bilateral upper extremity supported Sitting balance-Leahy Scale: Good     Standing balance  support: Single extremity supported;Bilateral upper extremity supported Standing balance-Leahy Scale: Poor                               Pertinent Vitals/Pain Pain Assessment: No/denies pain    Home Living Family/patient expects to be discharged to:: Private residence Living Arrangements: Other relatives Available Help at Discharge: Family;Available 24 hours/day Type of Home: House       Home Layout: One level Home Equipment: Cane - single point Additional Comments: pt did not have a complete history    Prior Function Level of Independence: Independent with assistive device(s)         Comments: used SPC per pt     Hand Dominance   Dominant Hand: Right    Extremity/Trunk Assessment   Upper Extremity Assessment Upper Extremity Assessment: Generalized weakness    Lower Extremity Assessment Lower Extremity Assessment: Overall WFL for tasks assessed    Cervical / Trunk Assessment Cervical / Trunk Assessment: Kyphotic  Communication   Communication: No difficulties  Cognition Arousal/Alertness: Awake/alert Behavior During Therapy: Flat affect Overall Cognitive Status: History of cognitive impairments - at baseline                                        General Comments  Exercises     Assessment/Plan    PT Assessment Patient needs continued PT services  PT Problem List Decreased strength;Decreased range of motion;Decreased activity tolerance;Decreased balance;Decreased mobility;Decreased cognition;Decreased knowledge of use of DME;Decreased safety awareness;Cardiopulmonary status limiting activity;Obesity       PT Treatment Interventions DME instruction;Gait training;Functional mobility training;Therapeutic activities;Therapeutic exercise;Balance training;Neuromuscular re-education;Patient/family education    PT Goals (Current goals can be found in the Care Plan section)  Acute Rehab PT Goals Patient Stated Goal: to sit on  side of bed for dinner PT Goal Formulation: With patient Time For Goal Achievement: 11/08/17 Potential to Achieve Goals: Good    Frequency Min 2X/week   Barriers to discharge Decreased caregiver support pt states she is mostly home alone    Co-evaluation               AM-PAC PT "6 Clicks" Daily Activity  Outcome Measure Difficulty turning over in bed (including adjusting bedclothes, sheets and blankets)?: A Little Difficulty moving from lying on back to sitting on the side of the bed? : Unable Difficulty sitting down on and standing up from a chair with arms (e.g., wheelchair, bedside commode, etc,.)?: Unable Help needed moving to and from a bed to chair (including a wheelchair)?: A Lot Help needed walking in hospital room?: A Lot Help needed climbing 3-5 steps with a railing? : Total 6 Click Score: 10    End of Session   Activity Tolerance: Patient limited by fatigue Patient left: in bed;with call bell/phone within reach;with bed alarm set;with nursing/sitter in room Nurse Communication: Mobility status;Other (comment)(discussed plans for rehab) PT Visit Diagnosis: Unsteadiness on feet (R26.81);Muscle weakness (generalized) (M62.81);Adult, failure to thrive (R62.7)    Time: 2956-2130 PT Time Calculation (min) (ACUTE ONLY): 24 min   Charges:   PT Evaluation $PT Eval Moderate Complexity: 1 Mod PT Treatments $Therapeutic Activity: 8-22 mins       Ramond Dial 10/25/2017, 8:56 PM   8:59 PM, 10/25/17 Mee Hives, PT, MS Physical Therapist - [place] [phone] (Office)

## 2017-10-25 NOTE — Progress Notes (Signed)
Patient Demographics:    [name] [name], is a 84 y.o. female, DOB - [birth year], DQQ:[id number]  Admit date - 10/23/2017   Admitting Physician Shelbie Proctor, MD  Outpatient Primary MD for the patient is Vidal Schwalbe, MD  LOS - 1   Chief Complaint  Patient presents with  . Shortness of Breath        Subjective:    [name] [name] today has no fevers, no emesis,  No chest pain, no orthopnea basically continues to have some dyspnea with minimal ambulation, voiding well  Assessment  & Plan :    Principal Problem:   Acute on chronic combined systolic and diastolic CHF (congestive heart failure) (HCC) Active Problems:   Chronic a-fib (HCC)   Elevated troponin I level   DM type 2 (diabetes mellitus, type 2) (HCC)   CHF (congestive heart failure) (HCC)   Acute on chronic renal failure (HCC)   Hyperkalemia  Brief Summary:- 84 year old with past medical history relevant for combined systolic and diastolic dysfunction CHF, CKD stage III, chronic A. fib diabetes and hypertension admitted on 10/23/2017 with worsening dyspnea and lower extremity edema consistent with CHF flareup.   Plan:- 1)HFrEF--- weight is down to 83.7 kg from 89.8, fluid balance -2100, continues to have some dyspnea on exertion, patient with history of combined systolic and diastolic dysfunction CHF, c/n  daily weights and fluid input and output monitoring, echo without pericardial effusion and EF 45 to 50%, troponins are flat, no evidence of frank ACS, change IV Lasix to 40 mg every morning and 20 mg every afternoon  2)AKI----acute kidney injury on CKD stage - III, renal function is improving,    creatinine on admission= 2.62  ,   baseline creatinine = 1.5   , creatinine is now= 2.1      , renally adjust medications, avoid nephrotoxic agents/dehydration/hypotension, hold lisinopril due to kidney concerns  3) chronic atrial  fibrillation-stable, continue apixaban for anticoagulation, Toprol-XL 25 mg daily for rate control  4)DM2-not well controlled, A1c 7.9, c/n  Amaryl 2 mg p.o. every morning with breakfast, use Novolog/Humalog Sliding scale insulin with Accu-Cheks/Fingersticks as ordered    Disposition/Need for in-Hospital Stay- patient unable to be discharged at this time due to CHF flareup with dyspnea on exertion and volume overload, requiring IV diuretics as well as  renal function requiring close monitoring  Code Status : Full    Disposition Plan  : Home    DVT Prophylaxis  :  Apixaban  Lab Results  Component Value Date   PLT 103 (L) 10/23/2017    Inpatient Medications  Scheduled Meds: . allopurinol  100 mg Oral Daily  . apixaban  2.5 mg Oral BID  . famotidine  20 mg Oral QHS  . ferrous sulfate  325 mg Oral BID WC  . fluticasone  1 spray Each Nare Daily  . furosemide  40 mg Intravenous Q12H  . glimepiride  2 mg Oral Q breakfast  . insulin aspart  0-9 Units Subcutaneous TID WC  . ipratropium  2 spray Each Nare BID  . loratadine  10 mg Oral Daily  . metoprolol succinate  25 mg Oral q morning - 10a  . montelukast  10 mg Oral QHS  . multivitamin with minerals  1 tablet Oral Daily  . nystatin   Topical TID  . pantoprazole  40 mg Oral Daily  . pravastatin  40 mg Oral QPM  . sodium chloride flush  3 mL Intravenous Q12H  . [START ON 11/05/2017] Vitamin D (Ergocalciferol)  50,000 Units Oral Q30 days   Continuous Infusions: . sodium chloride     PRN Meds:.sodium chloride, acetaminophen, levalbuterol, ondansetron (ZOFRAN) IV, pregabalin, sodium chloride, sodium chloride flush    Anti-infectives (From admission, onward)   None        Objective:   Vitals:   10/24/17 2141 10/25/17 0531 10/25/17 0700 10/25/17 1313  BP: (!) 141/70 (!) 143/70  139/61  Pulse: 90 71  81  Resp:    18  Temp: 97.8 F (36.6 C) 98.4 F (36.9 C)  98.3 F (36.8 C)  TempSrc: Oral Oral  Oral  SpO2: 98% 98%   100%  Weight:   83.7 kg   Height:        Wt Readings from Last 3 Encounters:  10/25/17 83.7 kg  09/10/17 85.2 kg  06/01/17 79.4 kg     Intake/Output Summary (Last 24 hours) at 10/25/2017 1746 Last data filed at 10/25/2017 1100 Gross per 24 hour  Intake 600 ml  Output 2700 ml  Net -2100 ml     Physical Exam Patient is examined daily including today on 10/25/17 , exams remain the same as of yesterday except that has changed   Gen:- Awake Alert,  In no apparent distress  HEENT:- [place].AT, No sclera icterus Neck-Supple Neck,No JVD,.  Lungs-improving air movement, with faint bibasilar rales CV- S1, S2 normal, irregularly irregular Abd-  +ve B.Sounds, Abd Soft, No tenderness,    Extremity/Skin:-1+ pitting edema,   good pulses Psych-affect is appropriate, oriented x3 Neuro-no new focal deficits, no tremors   Data Review:   Micro Results No results found for this or any previous visit (from the past 240 hour(s)).  Radiology Reports Dg Chest 2 View  Result Date: 10/23/2017 CLINICAL DATA:  Shortness of breath EXAM: CHEST - 2 VIEW COMPARISON:  06/01/2017, 08/14/2016 FINDINGS: Moderate cardiomegaly with globular cardiac configuration. Vascular congestion. Streaky scarring or atelectasis in the right mid lung. No pleural effusion. Degenerative changes of the spine. Aortic atherosclerosis. IMPRESSION: 1. Moderate to marked cardiomegaly, consider pericardial effusion given configuration. Mild vascular congestion. Electronically Signed   By: Donavan Foil M.D.   On: 10/23/2017 15:54     CBC Recent Labs  Lab 10/23/17 1506  WBC 5.3  HGB 11.3*  HCT 36.2  PLT 103*  MCV 93.1  MCH 29.0  MCHC 31.2  RDW 17.4*  LYMPHSABS 1.1  MONOABS 0.7  EOSABS 0.3  BASOSABS 0.0    Chemistries  Recent Labs  Lab 10/23/17 1506 10/24/17 0502 10/25/17 0520  NA 141 144 145  K 5.5* 4.6 3.9  CL 114* 112* 104  CO2 22 24 30   GLUCOSE 169* 121* 129*  BUN 68* 65* 55*  CREATININE 2.62* 2.43* 2.12*    CALCIUM 8.6* 8.8* 9.2  AST 30  --   --   ALT 14  --   --   ALKPHOS 83  --   --   BILITOT 1.3*  --   --    ------------------------------------------------------------------------------------------------------------------ No results for input(s): CHOL, HDL, LDLCALC, TRIG, CHOLHDL, LDLDIRECT in the last 72 hours.  Lab Results  Component Value Date   HGBA1C 7.9 (H) 10/23/2017   ------------------------------------------------------------------------------------------------------------------ No results for input(s): TSH, T4TOTAL, T3FREE, THYROIDAB in the last  72 hours.  Invalid input(s): FREET3 ------------------------------------------------------------------------------------------------------------------ No results for input(s): VITAMINB12, FOLATE, FERRITIN, TIBC, IRON, RETICCTPCT in the last 72 hours.  Coagulation profile No results for input(s): INR, PROTIME in the last 168 hours.  No results for input(s): DDIMER in the last 72 hours.  Cardiac Enzymes Recent Labs  Lab 10/23/17 1506 10/23/17 1753 10/23/17 2357  TROPONINI 0.03* 0.03* 0.03*   ------------------------------------------------------------------------------------------------------------------    Component Value Date/Time   BNP 314.0 (H) 10/23/2017 1449    Fayne Mcguffee M.D on 10/25/2017 at 5:46 PM  Pager---[phone] Go to [email or web address] - password [email or web address] for contact info  [facility] - Office  [phone]

## 2017-10-26 LAB — GLUCOSE, CAPILLARY
Glucose-Capillary: 114 mg/dL — ABNORMAL HIGH (ref 70–99)
Glucose-Capillary: 159 mg/dL — ABNORMAL HIGH (ref 70–99)
Glucose-Capillary: 74 mg/dL (ref 70–99)
Glucose-Capillary: 140 mg/dL — ABNORMAL HIGH (ref 70–99)

## 2017-10-26 LAB — BASIC METABOLIC PANEL
Anion gap: 14 (ref 5–15)
BUN: 54 mg/dL — ABNORMAL HIGH (ref 8–23)
CO2: 31 mmol/L (ref 22–32)
Calcium: 8.6 mg/dL — ABNORMAL LOW (ref 8.9–10.3)
Chloride: 100 mmol/L (ref 98–111)
Creatinine, Ser: 2.28 mg/dL — ABNORMAL HIGH (ref 0.44–1.00)
GFR calc Af Amer: 21 mL/min — ABNORMAL LOW (ref >60)
GFR calc non Af Amer: 18 mL/min — ABNORMAL LOW (ref >60)
Glucose, Bld: 137 mg/dL — ABNORMAL HIGH (ref 70–99)
Potassium: 3.9 mmol/L (ref 3.5–5.1)
Sodium: 145 mmol/L (ref 135–145)

## 2017-10-26 MED ORDER — FUROSEMIDE 40 MG PO TABS
40.0000 mg | ORAL_TABLET | Freq: Every day | ORAL | Status: DC
  Administered 2017-10-26 – 2017-10-27 (×2): 40 mg via ORAL
  Filled 2017-10-26 (×2): qty 1

## 2017-10-26 MED ORDER — BISACODYL 5 MG PO TBEC
5.0000 mg | DELAYED_RELEASE_TABLET | Freq: Every day | ORAL | Status: DC | PRN
  Administered 2017-10-26: 5 mg via ORAL
  Filled 2017-10-26: qty 1

## 2017-10-26 NOTE — Progress Notes (Signed)
Patient Demographics:    [name] [name], is a 84 y.o. female, DOB - Jun 22, 1930, [id number]  Admit date - 10/23/2017   Admitting Physician Shelbie Proctor, MD  Outpatient Primary MD for the patient is Vidal Schwalbe, MD  LOS - 2   Chief Complaint  Patient presents with  . Shortness of Breath        Subjective:    [name] [name] today has no fevers, no emesis,  No chest pain, dyspnea on exertion is improving,   Assessment  & Plan :    Principal Problem:   Acute on chronic combined systolic and diastolic CHF (congestive heart failure) (HCC) Active Problems:   Chronic a-fib (HCC)   Elevated troponin I level   DM type 2 (diabetes mellitus, type 2) (HCC)   CHF (congestive heart failure) (HCC)   Acute on chronic renal failure (HCC)   Hyperkalemia  Brief Summary:- 84 year old with past medical history relevant for combined systolic and diastolic dysfunction CHF, CKD stage III, chronic A. fib diabetes and hypertension admitted on 10/23/2017 with worsening dyspnea and lower extremity edema consistent with CHF flareup, also found to have worsening renal function  Plan:- 1)HFrEF--- weight is down to 83.7 kg from 89.8, fluid balance remains negative,, improving dyspnea on exertion, patient with history of combined systolic and diastolic dysfunction CHF, c/n  daily weights and fluid input and output monitoring, echo without pericardial effusion and EF 45 to 50%, troponins are flat, no evidence of frank ACS, change IV Lasix to oral Lasix on 10/27/17,   2)AKI----acute kidney injury on CKD stage - III, renal function is improving,    creatinine on admission= 2.62  ,   baseline creatinine = 1.5   , creatinine is now= 2.2      , renally adjust medications, avoid nephrotoxic agents/dehydration/hypotension, c/n to hold lisinopril due to kidney concerns  3) chronic atrial fibrillation-stable, continue apixaban for  anticoagulation, c/n Toprol-XL 25 mg daily for rate control  4)DM2-not well controlled, A1c 7.9, c/n  Amaryl 2 mg p.o. every morning with breakfast, use Novolog/Humalog Sliding scale insulin with Accu-Cheks/Fingersticks as ordered  5)Generalized Weakness/Debility--physical therapy evaluation appreciated, recommended skilled nursing facility rehab  Disposition/Need for in-Hospital Stay- patient unable to be discharged at this time due to CHF flareup with dyspnea on exertion and volume overload, treated with IV diuretics while monitoring renal function closely, patient is now awaiting transfer/insurance approval for skilled nursing facility rehab   Code Status : Full   Disposition Plan  :   DVT Prophylaxis  :  Apixaban  Lab Results  Component Value Date   PLT 103 (L) 10/23/2017    Inpatient Medications  Scheduled Meds: . allopurinol  100 mg Oral Daily  . apixaban  2.5 mg Oral BID  . famotidine  20 mg Oral QHS  . ferrous sulfate  325 mg Oral BID WC  . fluticasone  1 spray Each Nare Daily  . furosemide  40 mg Oral Daily  . glimepiride  2 mg Oral Q breakfast  . insulin aspart  0-9 Units Subcutaneous TID WC  . ipratropium  2 spray Each Nare BID  . loratadine  10 mg Oral Daily  . metoprolol succinate  25 mg Oral q morning - 10a  . montelukast  10 mg Oral QHS  . multivitamin with minerals  1 tablet Oral Daily  . nystatin   Topical TID  . pantoprazole  40 mg Oral Daily  . pravastatin  40 mg Oral QPM  . sodium chloride flush  3 mL Intravenous Q12H  . [START ON 11/05/2017] Vitamin D (Ergocalciferol)  50,000 Units Oral Q30 days   Continuous Infusions: . sodium chloride     PRN Meds:.sodium chloride, acetaminophen, levalbuterol, ondansetron (ZOFRAN) IV, pregabalin, sodium chloride, sodium chloride flush    Anti-infectives (From admission, onward)   None        Objective:   Vitals:   10/25/17 2130 10/26/17 0500 10/26/17 0629 10/26/17 0632  BP: 98/84   (!) 150/73  Pulse: 87   90 (!) 59  Resp: 18  17 17   Temp: (!) 97.4 F (36.3 C)  97.8 F (36.6 C) 97.8 F (36.6 C)  TempSrc: Oral  Oral Oral  SpO2: 97%  96% 96%  Weight:  83.7 kg    Height:        Wt Readings from Last 3 Encounters:  10/26/17 83.7 kg  09/10/17 85.2 kg  06/01/17 79.4 kg     Intake/Output Summary (Last 24 hours) at 10/26/2017 1704 Last data filed at 10/26/2017 1308 Gross per 24 hour  Intake 480 ml  Output 600 ml  Net -120 ml   Physical Exam Patient is examined daily including today on 10/26/17 , exams remain the same as of yesterday except that has changed   Gen:- Awake Alert,  In no apparent distress  HEENT:- .AT, No sclera icterus Neck-Supple Neck,No JVD,.  Lungs-improving air movement, with faint bibasilar rales, no wheezing CV- S1, S2 normal, irregularly irregular Abd-  +ve B.Sounds, Abd Soft, No tenderness,    Extremity/Skin:- Trace pitting edema,   good pulses Psych-affect is appropriate, oriented x3 Neuro-no new focal deficits, no tremors   Data Review:   Micro Results No results found for this or any previous visit (from the past 240 hour(s)).  Radiology Reports Dg Chest 2 View  Result Date: 10/23/2017 CLINICAL DATA:  Shortness of breath EXAM: CHEST - 2 VIEW COMPARISON:  06/01/2017, 08/14/2016 FINDINGS: Moderate cardiomegaly with globular cardiac configuration. Vascular congestion. Streaky scarring or atelectasis in the right mid lung. No pleural effusion. Degenerative changes of the spine. Aortic atherosclerosis. IMPRESSION: 1. Moderate to marked cardiomegaly, consider pericardial effusion given configuration. Mild vascular congestion. Electronically Signed   By: Donavan Foil M.D.   On: 10/23/2017 15:54     CBC Recent Labs  Lab 10/23/17 1506  WBC 5.3  HGB 11.3*  HCT 36.2  PLT 103*  MCV 93.1  MCH 29.0  MCHC 31.2  RDW 17.4*  LYMPHSABS 1.1  MONOABS 0.7  EOSABS 0.3  BASOSABS 0.0    Chemistries  Recent Labs  Lab 10/23/17 1506 10/24/17 0502  10/25/17 0520 10/26/17 0606  NA 141 144 145 145  K 5.5* 4.6 3.9 3.9  CL 114* 112* 104 100  CO2 22 24 30 31   GLUCOSE 169* 121* 129* 137*  BUN 68* 65* 55* 54*  CREATININE 2.62* 2.43* 2.12* 2.28*  CALCIUM 8.6* 8.8* 9.2 8.6*  AST 30  --   --   --   ALT 14  --   --   --   ALKPHOS 83  --   --   --   BILITOT 1.3*  --   --   --    ------------------------------------------------------------------------------------------------------------------ No results for input(s): CHOL, HDL, LDLCALC, TRIG, CHOLHDL,  LDLDIRECT in the last 72 hours.  Lab Results  Component Value Date   HGBA1C 7.9 (H) 10/23/2017   ------------------------------------------------------------------------------------------------------------------ No results for input(s): TSH, T4TOTAL, T3FREE, THYROIDAB in the last 72 hours.  Invalid input(s): FREET3 ------------------------------------------------------------------------------------------------------------------ No results for input(s): VITAMINB12, FOLATE, FERRITIN, TIBC, IRON, RETICCTPCT in the last 72 hours.  Coagulation profile No results for input(s): INR, PROTIME in the last 168 hours.  No results for input(s): DDIMER in the last 72 hours.  Cardiac Enzymes Recent Labs  Lab 10/23/17 1506 10/23/17 1753 10/23/17 2357  TROPONINI 0.03* 0.03* 0.03*   ------------------------------------------------------------------------------------------------------------------    Component Value Date/Time   BNP 314.0 (H) 10/23/2017 1449    Cierah Crader M.D on 10/26/2017 at 5:04 PM  Pager---[phone] Go to [email or web address] - password [email or web address] for contact info  [facility] - Office  [phone]

## 2017-10-26 NOTE — Progress Notes (Signed)
Physical therapy suggests SNF. Upon MD requests this  Nurse spoke with patient and she declines going to a SNF at this time. Patient states that she has "enough help at home". MD made aware of patients denial of SNF

## 2017-10-27 LAB — CBC
HCT: 42.3 % (ref 36.0–46.0)
Hemoglobin: 13 g/dL (ref 12.0–15.0)
MCH: 28.3 pg (ref 26.0–34.0)
MCHC: 30.7 g/dL (ref 30.0–36.0)
MCV: 92.2 fL (ref 78.0–100.0)
Platelets: 176 10*3/uL (ref 150–400)
RBC: 4.59 MIL/uL (ref 3.87–5.11)
RDW: 17.3 % — ABNORMAL HIGH (ref 11.5–15.5)
WBC: 7.6 10*3/uL (ref 4.0–10.5)

## 2017-10-27 LAB — BASIC METABOLIC PANEL
Anion gap: 13 (ref 5–15)
BUN: 62 mg/dL — ABNORMAL HIGH (ref 8–23)
CO2: 31 mmol/L (ref 22–32)
Calcium: 7.9 mg/dL — ABNORMAL LOW (ref 8.9–10.3)
Chloride: 98 mmol/L (ref 98–111)
Creatinine, Ser: 2.73 mg/dL — ABNORMAL HIGH (ref 0.44–1.00)
GFR calc Af Amer: 17 mL/min — ABNORMAL LOW (ref >60)
GFR calc non Af Amer: 15 mL/min — ABNORMAL LOW (ref >60)
Glucose, Bld: 110 mg/dL — ABNORMAL HIGH (ref 70–99)
Potassium: 3.6 mmol/L (ref 3.5–5.1)
Sodium: 142 mmol/L (ref 135–145)

## 2017-10-27 LAB — GLUCOSE, CAPILLARY
Glucose-Capillary: 82 mg/dL (ref 70–99)
Glucose-Capillary: 157 mg/dL — ABNORMAL HIGH (ref 70–99)
Glucose-Capillary: 101 mg/dL — ABNORMAL HIGH (ref 70–99)

## 2017-10-27 MED ORDER — FUROSEMIDE 40 MG PO TABS: 40.0000 mg | ORAL_TABLET | Freq: Every day | ORAL | 2 refills | Status: AC

## 2017-10-27 MED ORDER — LISINOPRIL 10 MG PO TABS: 10.0000 mg | ORAL_TABLET | Freq: Every day | ORAL | 1 refills | Status: DC

## 2017-10-27 MED ORDER — APIXABAN 2.5 MG PO TABS: 2.5000 mg | ORAL_TABLET | Freq: Two times a day (BID) | ORAL | 2 refills | Status: AC

## 2017-10-27 MED ORDER — ACETAMINOPHEN 325 MG PO TABS: 650.0000 mg | ORAL_TABLET | ORAL | 1 refills | Status: AC | PRN

## 2017-10-27 MED ORDER — GLIMEPIRIDE 2 MG PO TABS: 2.0000 mg | ORAL_TABLET | Freq: Every day | ORAL | 1 refills | Status: AC

## 2017-10-27 NOTE — Discharge Summary (Addendum)
[name] [name], is a 84 y.o. female  DOB 21-Dec-1930  MRN [id number].  Admission date:  10/23/2017  Admitting Physician  Shelbie Proctor, MD  Discharge Date:  10/27/2017   Primary MD  Vidal Schwalbe, MD  Recommendations for primary care physician for things to follow:  1)Hold Lisinopril for now, Restart it if ok with your Nephrologist after Blood work next week 2)Recheck BMP/kidney test with your Nephrologist in [place] within the next 3 to 5 days 3)Avoid ibuprofen/Advil/Aleve/Motrin/Goody Powders/Naproxen/BC powders/Meloxicam/Diclofenac/Indomethacin and other Nonsteroidal anti-inflammatory medications as these will make you more likely to bleed and can cause stomach ulcers, can also cause Kidney problems.  4)Very low-salt diet advised 5)Weigh yourself daily, call if you gain more than 3 pounds in 1 day or more than 5 pounds in 1 week as your diuretic medications may need to be adjusted 6)Limit your Fluid  intake to no more than 60 ounces (1.8 Liters) per day   Admission Diagnosis  Acute on chronic combined systolic and diastolic congestive heart failure ([place]) [I50.43] AKI (acute kidney injury) ([place]) [N17.9]   Discharge Diagnosis  Acute on chronic combined systolic and diastolic congestive heart failure ([place]) [I50.43] AKI (acute kidney injury) ([place]) [N17.9]    Principal Problem:   Acute on chronic combined systolic and diastolic CHF (congestive heart failure) (HCC) Active Problems:   Chronic a-fib (HCC)   Elevated troponin I level   DM type 2 (diabetes mellitus, type 2) (HCC)   CHF (congestive heart failure) (HCC)   Acute on chronic renal failure (HCC)   Hyperkalemia      Past Medical History:  Diagnosis Date  . Acid reflux   . ARF (acute renal failure) ([place]) 09/16/2010   Prerenal. Creatinine 1.36 before discharge.  . Atrial fibrillation ([place])   . CHF (congestive heart failure) (HCC)    Hx of diastolic CHF.  Marland Kitchen Chronic anticoagulation   . CKD (chronic kidney disease) stage 3, GFR 30-59 ml/min (HCC)   . Diverticular hemorrhage 2011   Per colonoscopy  . Diverticulosis 2011   Per colonoscopy  . GI bleeding 08/03/2011   Not scoped but likely secondary to hemorrhoids or diverticulosis  . Gout   . Hx of medication noncompliance   . Hypercholesterolemia   . Hyperglycemia    Query DM 2  . Hypertension   . Internal hemorrhoids 2011   Per colonoscopy  . Seasonal allergies   . Thrombocytopenia due to drugs    Allopurinol    Past Surgical History:  Procedure Laterality Date  . ADENOIDECTOMY    . APPENDECTOMY    . BUNIONECTOMY    . CATARACT EXTRACTION    . CESAREAN SECTION    . ESOPHAGOGASTRODUODENOSCOPY  08/20/09   small hiatal hernia/mild gastritis  . ileocolonoscopy  08/20/09   normal terminal ileum/pancolonic diverticula/no polyps/benign colon mucosa  . JOINT REPLACEMENT     Total right knee 2007  . LAPAROTOMY  09/22/2010   Procedure: EXPLORATORY LAPAROTOMY;  Surgeon: [name];  Location: [facility];  Service: General;  Laterality:  N/A;  Lysis of Adhesions  . TONSILLECTOMY    . TUBAL LIGATION       HPI  from the history and physical done on the day of admission:    Chief Complaint: swelling  HPI: [name] [name] is a 84 y.o. female with medical history significant of CHF hypertension renal disease presents with edema.  Over the past 2 weeks patient has had increasing shortness of breath.  Over the past 2 days family has noticed she seemed more swollen all over from head to toe.  Patient was taken to her cardiologist In [place] by her family on yesterday and they increased her Lasix doses.  Higher Lasix dose was not given until today however.  Patient is otherwise compliant with her medications.  She denies any chest pain.  Patient had a chest x-ray today that showed marked cardiomegaly and vascular congestion.  Her echo in 2018 showed EF 45 to 26% with RV  systolic dysfunction and diastolic dysfunction as well.She also has worsening renal function with Crt > 2 today with baseline around 1.5. She does complain of chronic stress incontinence. She has seen urology and has f/u appt scheduled.  ED Course: Chest x-ray showed moderate to marked  cardiomegaly and mild vascular congestion.  Patient was given IV Lasix and nebulizers with improvement of her shortness of breath    Hospital Course:     Brief Summary:- 84 year old with past medical history relevant for combined systolic and diastolic dysfunction CHF, CKD stage III, chronic A. fib diabetes and hypertension admitted on 10/23/2017 with worsening dyspnea and lower extremity edema consistent with CHF flareup, also found to have worsening renal function.  CHF symptoms improved with diuresis, creatinine remains elevated.   Plan:- 1)HFrEF--- weight is down to 83.5 kg from 89.8, fluid balance remains negative this admission,, resolved less of breath unresolved dyspnea on exertion,, patient was treated for acute exacerbation of combined systolic and diastolic dysfunction CHF, c/n  daily weights at home, echo without pericardial effusion and EF 45 to 50%, troponins are flat, no evidence of frank ACS, treated with IV Lasix, will discharge on Lasix 40 p.o. daily   2)AKI----acute kidney injury on CKD stage - III,   creatinine on admission= 2.62  ,   baseline creatinine = 1.5   , creatinine is now= 2.7      , renally adjust medications, avoid nephrotoxic agents/dehydration/hypotension, c/n to hold lisinopril due to kidney concerns, hold lisinopril until seen by nephrologist, Case discussed with Dr. Lowanda Foster the on-call nephrologist, he reviewed patient's labs, okay to discharge home today with outpatient nephrology follow-up within a week, patient will follow up with her regular nephrologist in [place], [place]  3)Chronic Atrial Fibrillation-stable, reduce apixaban to 2.5 mg twice daily given poor  renal function and age , c/n Toprol-XL 25 mg daily for rate control  4)DM2-not well controlled, A1c 7.9, c/n  Amaryl 2 mg p.o. every morning with breakfast,    5)Generalized Weakness/Debility--physical therapy evaluation appreciated, recommended skilled nursing facility rehab, patient and family declined skilled nursing facility placement, will discharge home with home health Physical therapy and RN  Code Status : Full   Disposition Plan  : Home with home health services plan discussed with patient's daughters Silva Bandy and Kennyth Lose, patient and family declined transfer to skilled nursing facility for rehab  DVT Prophylaxis  :  Apixaban 2.5 mg twice daily   Discharge Condition: stable  Follow UP--nephrologist in [facility] for repeat BMP/kidney test  Diet and Activity recommendation:  As advised  Discharge Instructions    Discharge Instructions    (HEART FAILURE PATIENTS) Call MD:  Anytime you have any of the following symptoms: 1) 3 pound weight gain in 24 hours or 5 pounds in 1 week 2) shortness of breath, with or without a dry hacking cough 3) swelling in the hands, feet or stomach 4) if you have to sleep on extra pillows at night in order to breathe.   Complete by:  As directed    Call MD for:  difficulty breathing, headache or visual disturbances   Complete by:  As directed    Call MD for:  persistant dizziness or light-headedness   Complete by:  As directed    Call MD for:  persistant nausea and vomiting   Complete by:  As directed    Call MD for:  severe uncontrolled pain   Complete by:  As directed    Call MD for:  temperature >100.4   Complete by:  As directed    Diet - low sodium heart healthy   Complete by:  As directed    Diet Carb Modified   Complete by:  As directed    Discharge instructions   Complete by:  As directed    1)Hold Lisinopril for now, Restart it if ok with your Nephrologist after Blood work next week 2)Recheck BMP/kidney test with your  Nephrologist in [place] within the next 3 to 5 days 3)Avoid ibuprofen/Advil/Aleve/Motrin/Goody Powders/Naproxen/BC powders/Meloxicam/Diclofenac/Indomethacin and other Nonsteroidal anti-inflammatory medications as these will make you more likely to bleed and can cause stomach ulcers, can also cause Kidney problems.  4)Very low-salt diet advised 5)Weigh yourself daily, call if you gain more than 3 pounds in 1 day or more than 5 pounds in 1 week as your diuretic medications may need to be adjusted 6)Limit your Fluid  intake to no more than 60 ounces (1.8 Liters) per day   Increase activity slowly   Complete by:  As directed        Discharge Medications     Allergies as of 10/27/2017      Reactions   Codeine Nausea And Vomiting   Sulfa Antibiotics Nausea And Vomiting   Sulfur Nausea And Vomiting      Medication List    STOP taking these medications   cetirizine 10 MG tablet Commonly known as:  ZYRTEC   ranitidine 150 MG tablet Commonly known as:  ZANTAC     TAKE these medications   acetaminophen 325 MG tablet Commonly known as:  TYLENOL Take 2 tablets (650 mg total) by mouth every 4 (four) hours as needed for headache or mild pain.   allopurinol 100 MG tablet Commonly known as:  ZYLOPRIM Take 100 mg by mouth daily.   apixaban 2.5 MG Tabs tablet Commonly known as:  ELIQUIS Take 1 tablet (2.5 mg total) by mouth 2 (two) times daily. What changed:    medication strength  how much to take   Azelastine HCl 137 MCG/SPRAY Soln Place 1-2 sprays into the nose daily as needed.   DRY EYES OP Place 1 drop into both eyes as needed (dry eyes).   ferrous sulfate 325 (65 FE) MG tablet Take 325 mg by mouth 2 (two) times daily with a meal.   fluticasone 50 MCG/ACT nasal spray Commonly known as:  FLONASE Place 1 spray into both nostrils daily.   furosemide 40 MG tablet Commonly known as:  LASIX Take 1 tablet (40 mg total) by mouth daily. What changed:  when to take this     glimepiride 2 MG tablet Commonly known as:  AMARYL Take 1 tablet (2 mg total) by mouth daily with breakfast. Start taking on:  10/28/2017   ipratropium 0.06 % nasal spray Commonly known as:  ATROVENT Place 2 sprays into both nostrils 2 (two) times daily at 10 AM and 5 PM.   levalbuterol 1.25 MG/3ML nebulizer solution Commonly known as:  XOPENEX Take 1.25 mg by nebulization every 4 (four) hours as needed for wheezing.   lisinopril 10 MG tablet Commonly known as:  PRINIVIL,ZESTRIL Take 1 tablet (10 mg total) by mouth daily. Restart ik ok with your Nephrologist after Blood work What changed:  additional instructions   loratadine 10 MG tablet Commonly known as:  CLARITIN Take 10 mg by mouth daily.   LYRICA 75 MG capsule Generic drug:  pregabalin Take 75 mg by mouth 2 (two) times daily as needed (for pain in feet).   metoprolol succinate 25 MG 24 hr tablet Commonly known as:  TOPROL-XL Take 25 mg by mouth every morning.   montelukast 10 MG tablet Commonly known as:  SINGULAIR Take 10 mg by mouth at bedtime.   multivitamin with minerals Tabs tablet Take 1 tablet by mouth daily.   omeprazole 20 MG capsule Commonly known as:  PRILOSEC Take 1 capsule (20 mg total) by mouth daily. What changed:  when to take this   potassium chloride SA 20 MEQ tablet Commonly known as:  K-DUR,KLOR-CON Take 1 tablet (20 mEq total) by mouth daily.   pravastatin 40 MG tablet Commonly known as:  PRAVACHOL Take 40 mg by mouth every evening.   sodium chloride 0.65 % Soln nasal spray Commonly known as:  OCEAN Place 1 spray into both nostrils as needed for congestion.   Vitamin D (Ergocalciferol) 50000 units Caps capsule Commonly known as:  DRISDOL Take 50,000 Units by mouth every 30 (thirty) days. Administered on the 1st of each month       Major procedures and Radiology Reports - PLEASE review detailed and final reports for all details, in brief -   Dg Chest 2 View  Result Date:  10/23/2017 CLINICAL DATA:  Shortness of breath EXAM: CHEST - 2 VIEW COMPARISON:  06/01/2017, 08/14/2016 FINDINGS: Moderate cardiomegaly with globular cardiac configuration. Vascular congestion. Streaky scarring or atelectasis in the right mid lung. No pleural effusion. Degenerative changes of the spine. Aortic atherosclerosis. IMPRESSION: 1. Moderate to marked cardiomegaly, consider pericardial effusion given configuration. Mild vascular congestion. Electronically Signed   By: Donavan Foil M.D.   On: 10/23/2017 15:54    Micro Results   No results found for this or any previous visit (from the past 240 hour(s)).   Today   Subjective    [name] [name] today has no new complaints, breathing better, able to get from bed to commode, with some help.  Daughter Silva Bandy at bedside, questions answered, declined skilled nursing facility placement for rehab          Patient has been seen and examined prior to discharge   Objective   Blood pressure 114/75, pulse 85, temperature 97.7 F (36.5 C), temperature source Oral, resp. rate 18, height 5\' 3"  (1.6 m), weight 83.5 kg, SpO2 92 %.   Intake/Output Summary (Last 24 hours) at 10/27/2017 1304 Last data filed at 10/27/2017 1000 Gross per 24 hour  Intake 720 ml  Output 450 ml  Net 270 ml    Exam Patient is examined daily including today on 10/27/17 , exams  remain the same as of yesterday except that has changed   Gen:- Awake Alert,  In no apparent distress  HEENT:- [place].AT, No sclera icterus Neck-Supple Neck,No JVD,.  Lungs-improving air movement, no rales, no wheezing CV- S1, S2 normal, irregularly irregular Abd-  +ve B.Sounds, Abd Soft, No tenderness,    Extremity/Skin:- resolved pitting edema,   good pulses Psych-affect is appropriate, oriented x3 Neuro-no new focal deficits, no tremors    Data Review   CBC w Diff:  Lab Results  Component Value Date   WBC 7.6 10/27/2017   HGB 13.0 10/27/2017   HGB 11.2 (L) 10/02/2011   HCT 42.3  10/27/2017   PLT 176 10/27/2017   LYMPHOPCT 21 10/23/2017   MONOPCT 12 10/23/2017   EOSPCT 5 10/23/2017   BASOPCT 1 10/23/2017    CMP:  Lab Results  Component Value Date   NA 142 10/27/2017   NA 142 10/12/2011   K 3.6 10/27/2017   K 3.8 10/12/2011   CL 98 10/27/2017   CL 109 (H) 10/12/2011   CO2 31 10/27/2017   CO2 24 10/12/2011   BUN 62 (H) 10/27/2017   BUN 64 (H) 10/12/2011   CREATININE 2.73 (H) 10/27/2017   CREATININE 2.43 (H) 10/12/2011   PROT 6.6 10/23/2017   ALBUMIN 3.9 10/23/2017   BILITOT 1.3 (H) 10/23/2017   ALKPHOS 83 10/23/2017   AST 30 10/23/2017   ALT 14 10/23/2017  .   Total Discharge time is about 33 minutes  Roxan Hockey M.D on 10/27/2017 at 1:04 PM  Pager---[phone]  Go to [email or web address] - password [email or web address] for contact info  [facility] - Office  [phone]

## 2017-10-27 NOTE — Progress Notes (Addendum)
Patient discharged home with daughter.  Reviewed AVS and medications with patient and daughter.  IV removed - WNL. Instructed to call nephrologist for follow up apt in 3-5 days. Verbalizes understanding, no questions at this time  Patient in NAD - assisted off unit in [facility] by NT.

## 2017-10-27 NOTE — Care Management Note (Signed)
Case Management Note  Patient Details  Name: [name] [name] MRN: [id number] Date of Birth: 04/11/1930  Subjective/Objective:        Pt from home with family and will return home.  Pt has used home health in the past but not recently.       Action/Plan: Offered choice to patient who asked me to speak to daughter.  Spoke to daughter over the phone.  After discussing with family, she chose [name] [name]. Referral called to [facility] with [facility].           Expected Discharge Date:  10/27/17               Expected Discharge Plan:  [place]  In-House Referral:  NA  Discharge planning Services  CM Consult  Post Acute Care Choice:  Home Health Choice offered to:  Patient  DME Arranged:  N/A DME Agency:  NA  HH Arranged:  PT, RN [place] Agency:  [facility]  Status of Service:  Completed, signed off  If discussed at [place] of Stay Meetings, dates discussed:    Additional Comments:  Claudie Leach, RN 10/27/2017, 2:38 PM

## 2017-10-27 NOTE — Discharge Instructions (Signed)
1)Hold Lisinopril for now, Restart it if ok with your Nephrologist after Blood work next week 2)Recheck BMP/kidney test with your Nephrologist in [place] within the next 3 to 5 days 3)Avoid ibuprofen/Advil/Aleve/Motrin/Goody Powders/Naproxen/BC powders/Meloxicam/Diclofenac/Indomethacin and other Nonsteroidal anti-inflammatory medications as these will make you more likely to bleed and can cause stomach ulcers, can also cause Kidney problems.  4)Very low-salt diet advised 5)Weigh yourself daily, call if you gain more than 3 pounds in 1 day or more than 5 pounds in 1 week as your diuretic medications may need to be adjusted 6)Limit your Fluid  intake to no more than 60 ounces (1.8 Liters) per day

## 2017-12-17 ENCOUNTER — Ambulatory Visit: Payer: Medicare Other | Admitting: Allergy & Immunology

## 2017-12-18 ENCOUNTER — Ambulatory Visit: Payer: Medicare Other | Admitting: Allergy & Immunology

## 2018-02-24 ENCOUNTER — Encounter (HOSPITAL_COMMUNITY): Payer: Self-pay | Admitting: Emergency Medicine

## 2018-02-24 ENCOUNTER — Emergency Department (HOSPITAL_COMMUNITY): Payer: Medicare Other

## 2018-02-24 ENCOUNTER — Inpatient Hospital Stay (HOSPITAL_COMMUNITY): Payer: Medicare Other

## 2018-02-24 ENCOUNTER — Inpatient Hospital Stay (HOSPITAL_COMMUNITY)
Admission: EM | Admit: 2018-02-24 | Discharge: 2018-04-06 | DRG: 870 | Disposition: E | Payer: Medicare Other | Attending: Internal Medicine | Admitting: Internal Medicine

## 2018-02-24 DIAGNOSIS — G92 Toxic encephalopathy: Secondary | ICD-10-CM | POA: Diagnosis present

## 2018-02-24 DIAGNOSIS — R0682 Tachypnea, not elsewhere classified: Secondary | ICD-10-CM

## 2018-02-24 DIAGNOSIS — I340 Nonrheumatic mitral (valve) insufficiency: Secondary | ICD-10-CM | POA: Diagnosis not present

## 2018-02-24 DIAGNOSIS — Z6830 Body mass index (BMI) 30.0-30.9, adult: Secondary | ICD-10-CM

## 2018-02-24 DIAGNOSIS — N186 End stage renal disease: Secondary | ICD-10-CM | POA: Diagnosis present

## 2018-02-24 DIAGNOSIS — E162 Hypoglycemia, unspecified: Secondary | ICD-10-CM | POA: Diagnosis not present

## 2018-02-24 DIAGNOSIS — J150 Pneumonia due to Klebsiella pneumoniae: Secondary | ICD-10-CM | POA: Diagnosis present

## 2018-02-24 DIAGNOSIS — J302 Other seasonal allergic rhinitis: Secondary | ICD-10-CM | POA: Diagnosis present

## 2018-02-24 DIAGNOSIS — Z515 Encounter for palliative care: Secondary | ICD-10-CM | POA: Diagnosis not present

## 2018-02-24 DIAGNOSIS — J9601 Acute respiratory failure with hypoxia: Secondary | ICD-10-CM | POA: Diagnosis present

## 2018-02-24 DIAGNOSIS — I132 Hypertensive heart and chronic kidney disease with heart failure and with stage 5 chronic kidney disease, or end stage renal disease: Secondary | ICD-10-CM | POA: Diagnosis present

## 2018-02-24 DIAGNOSIS — A419 Sepsis, unspecified organism: Secondary | ICD-10-CM

## 2018-02-24 DIAGNOSIS — I5043 Acute on chronic combined systolic (congestive) and diastolic (congestive) heart failure: Secondary | ICD-10-CM | POA: Diagnosis present

## 2018-02-24 DIAGNOSIS — A0472 Enterocolitis due to Clostridium difficile, not specified as recurrent: Secondary | ICD-10-CM | POA: Diagnosis not present

## 2018-02-24 DIAGNOSIS — Z96651 Presence of right artificial knee joint: Secondary | ICD-10-CM | POA: Diagnosis present

## 2018-02-24 DIAGNOSIS — I5021 Acute systolic (congestive) heart failure: Secondary | ICD-10-CM

## 2018-02-24 DIAGNOSIS — Z66 Do not resuscitate: Secondary | ICD-10-CM | POA: Diagnosis present

## 2018-02-24 DIAGNOSIS — M109 Gout, unspecified: Secondary | ICD-10-CM | POA: Diagnosis present

## 2018-02-24 DIAGNOSIS — T68XXXA Hypothermia, initial encounter: Secondary | ICD-10-CM

## 2018-02-24 DIAGNOSIS — A4159 Other Gram-negative sepsis: Principal | ICD-10-CM | POA: Diagnosis present

## 2018-02-24 DIAGNOSIS — Z7902 Long term (current) use of antithrombotics/antiplatelets: Secondary | ICD-10-CM

## 2018-02-24 DIAGNOSIS — D631 Anemia in chronic kidney disease: Secondary | ICD-10-CM | POA: Diagnosis present

## 2018-02-24 DIAGNOSIS — R7989 Other specified abnormal findings of blood chemistry: Secondary | ICD-10-CM

## 2018-02-24 DIAGNOSIS — G929 Unspecified toxic encephalopathy: Secondary | ICD-10-CM

## 2018-02-24 DIAGNOSIS — Z7901 Long term (current) use of anticoagulants: Secondary | ICD-10-CM

## 2018-02-24 DIAGNOSIS — E875 Hyperkalemia: Secondary | ICD-10-CM | POA: Diagnosis present

## 2018-02-24 DIAGNOSIS — N289 Disorder of kidney and ureter, unspecified: Secondary | ICD-10-CM

## 2018-02-24 DIAGNOSIS — R34 Anuria and oliguria: Secondary | ICD-10-CM | POA: Diagnosis present

## 2018-02-24 DIAGNOSIS — E871 Hypo-osmolality and hyponatremia: Secondary | ICD-10-CM | POA: Diagnosis not present

## 2018-02-24 DIAGNOSIS — I428 Other cardiomyopathies: Secondary | ICD-10-CM | POA: Diagnosis present

## 2018-02-24 DIAGNOSIS — Z79899 Other long term (current) drug therapy: Secondary | ICD-10-CM

## 2018-02-24 DIAGNOSIS — I214 Non-ST elevation (NSTEMI) myocardial infarction: Secondary | ICD-10-CM | POA: Diagnosis not present

## 2018-02-24 DIAGNOSIS — N179 Acute kidney failure, unspecified: Secondary | ICD-10-CM | POA: Diagnosis not present

## 2018-02-24 DIAGNOSIS — J9690 Respiratory failure, unspecified, unspecified whether with hypoxia or hypercapnia: Secondary | ICD-10-CM

## 2018-02-24 DIAGNOSIS — E1122 Type 2 diabetes mellitus with diabetic chronic kidney disease: Secondary | ICD-10-CM | POA: Diagnosis present

## 2018-02-24 DIAGNOSIS — N170 Acute kidney failure with tubular necrosis: Secondary | ICD-10-CM | POA: Diagnosis present

## 2018-02-24 DIAGNOSIS — K219 Gastro-esophageal reflux disease without esophagitis: Secondary | ICD-10-CM | POA: Diagnosis present

## 2018-02-24 DIAGNOSIS — K7200 Acute and subacute hepatic failure without coma: Secondary | ICD-10-CM | POA: Diagnosis present

## 2018-02-24 DIAGNOSIS — Z885 Allergy status to narcotic agent status: Secondary | ICD-10-CM

## 2018-02-24 DIAGNOSIS — R570 Cardiogenic shock: Secondary | ICD-10-CM | POA: Diagnosis present

## 2018-02-24 DIAGNOSIS — E876 Hypokalemia: Secondary | ICD-10-CM | POA: Diagnosis not present

## 2018-02-24 DIAGNOSIS — Z8249 Family history of ischemic heart disease and other diseases of the circulatory system: Secondary | ICD-10-CM

## 2018-02-24 DIAGNOSIS — R778 Other specified abnormalities of plasma proteins: Secondary | ICD-10-CM

## 2018-02-24 DIAGNOSIS — T17890A Other foreign object in other parts of respiratory tract causing asphyxiation, initial encounter: Secondary | ICD-10-CM | POA: Diagnosis not present

## 2018-02-24 DIAGNOSIS — R6521 Severe sepsis with septic shock: Secondary | ICD-10-CM | POA: Diagnosis present

## 2018-02-24 DIAGNOSIS — I4819 Other persistent atrial fibrillation: Secondary | ICD-10-CM | POA: Diagnosis present

## 2018-02-24 DIAGNOSIS — R0600 Dyspnea, unspecified: Secondary | ICD-10-CM

## 2018-02-24 DIAGNOSIS — I4891 Unspecified atrial fibrillation: Secondary | ICD-10-CM | POA: Diagnosis not present

## 2018-02-24 DIAGNOSIS — E11649 Type 2 diabetes mellitus with hypoglycemia without coma: Secondary | ICD-10-CM | POA: Diagnosis present

## 2018-02-24 DIAGNOSIS — Z833 Family history of diabetes mellitus: Secondary | ICD-10-CM

## 2018-02-24 DIAGNOSIS — E785 Hyperlipidemia, unspecified: Secondary | ICD-10-CM | POA: Diagnosis present

## 2018-02-24 DIAGNOSIS — N939 Abnormal uterine and vaginal bleeding, unspecified: Secondary | ICD-10-CM | POA: Diagnosis not present

## 2018-02-24 DIAGNOSIS — R0689 Other abnormalities of breathing: Secondary | ICD-10-CM | POA: Diagnosis not present

## 2018-02-24 DIAGNOSIS — E669 Obesity, unspecified: Secondary | ICD-10-CM | POA: Diagnosis present

## 2018-02-24 DIAGNOSIS — J15212 Pneumonia due to Methicillin resistant Staphylococcus aureus: Secondary | ICD-10-CM | POA: Diagnosis present

## 2018-02-24 DIAGNOSIS — Z882 Allergy status to sulfonamides status: Secondary | ICD-10-CM

## 2018-02-24 DIAGNOSIS — R000 Tachycardia, unspecified: Secondary | ICD-10-CM | POA: Diagnosis not present

## 2018-02-24 DIAGNOSIS — E039 Hypothyroidism, unspecified: Secondary | ICD-10-CM | POA: Diagnosis present

## 2018-02-24 DIAGNOSIS — E874 Mixed disorder of acid-base balance: Secondary | ICD-10-CM | POA: Diagnosis present

## 2018-02-24 DIAGNOSIS — Z22322 Carrier or suspected carrier of Methicillin resistant Staphylococcus aureus: Secondary | ICD-10-CM

## 2018-02-24 DIAGNOSIS — Z7951 Long term (current) use of inhaled steroids: Secondary | ICD-10-CM

## 2018-02-24 DIAGNOSIS — I4821 Permanent atrial fibrillation: Secondary | ICD-10-CM | POA: Diagnosis not present

## 2018-02-24 DIAGNOSIS — R680 Hypothermia, not associated with low environmental temperature: Secondary | ICD-10-CM | POA: Diagnosis present

## 2018-02-24 DIAGNOSIS — Z79890 Hormone replacement therapy (postmenopausal): Secondary | ICD-10-CM

## 2018-02-24 HISTORY — DX: Obesity, unspecified: E11.69

## 2018-02-24 HISTORY — DX: Localized edema: R60.0

## 2018-02-24 HISTORY — DX: Type 2 diabetes mellitus with other specified complication: E66.9

## 2018-02-24 HISTORY — DX: Chronic kidney disease, stage 4 (severe): N18.4

## 2018-02-24 HISTORY — DX: Chronic combined systolic (congestive) and diastolic (congestive) heart failure: I50.42

## 2018-02-24 HISTORY — DX: Cardiomyopathy, unspecified: I42.9

## 2018-02-24 LAB — COMPREHENSIVE METABOLIC PANEL
ALT: 295 U/L — ABNORMAL HIGH (ref 0–44)
ALT: 450 U/L — ABNORMAL HIGH (ref 0–44)
AST: 644 U/L — ABNORMAL HIGH (ref 15–41)
AST: 972 U/L — ABNORMAL HIGH (ref 15–41)
Albumin: 3.8 g/dL (ref 3.5–5.0)
Albumin: 3.4 g/dL — ABNORMAL LOW (ref 3.5–5.0)
Alkaline Phosphatase: 93 U/L (ref 38–126)
Alkaline Phosphatase: 87 U/L (ref 38–126)
Anion gap: 20 — ABNORMAL HIGH (ref 5–15)
Anion gap: 18 — ABNORMAL HIGH (ref 5–15)
BUN: 445 mg/dL — ABNORMAL HIGH (ref 8–23)
BUN: 111 mg/dL — ABNORMAL HIGH (ref 8–23)
CO2: 8 mmol/L — ABNORMAL LOW (ref 22–32)
CO2: 10 mmol/L — ABNORMAL LOW (ref 22–32)
Calcium: 8.5 mg/dL — ABNORMAL LOW (ref 8.9–10.3)
Calcium: 7.9 mg/dL — ABNORMAL LOW (ref 8.9–10.3)
Chloride: 109 mmol/L (ref 98–111)
Chloride: 110 mmol/L (ref 98–111)
Creatinine, Ser: 6.03 mg/dL — ABNORMAL HIGH (ref 0.44–1.00)
Creatinine, Ser: 6.14 mg/dL — ABNORMAL HIGH (ref 0.44–1.00)
GFR calc Af Amer: 7 mL/min — ABNORMAL LOW (ref >60)
GFR calc Af Amer: 7 mL/min — ABNORMAL LOW (ref >60)
GFR calc non Af Amer: 6 mL/min — ABNORMAL LOW (ref >60)
GFR calc non Af Amer: 6 mL/min — ABNORMAL LOW (ref >60)
Glucose, Bld: 182 mg/dL — ABNORMAL HIGH (ref 70–99)
Glucose, Bld: 273 mg/dL — ABNORMAL HIGH (ref 70–99)
Potassium: 6.9 mmol/L (ref 3.5–5.1)
Potassium: 6 mmol/L — ABNORMAL HIGH (ref 3.5–5.1)
Sodium: 137 mmol/L (ref 135–145)
Sodium: 138 mmol/L (ref 135–145)
Total Bilirubin: 2.6 mg/dL — ABNORMAL HIGH (ref 0.3–1.2)
Total Bilirubin: 2.5 mg/dL — ABNORMAL HIGH (ref 0.3–1.2)
Total Protein: 7.3 g/dL (ref 6.5–8.1)
Total Protein: 6.6 g/dL (ref 6.5–8.1)

## 2018-02-24 LAB — CBC WITH DIFFERENTIAL/PLATELET
Abs Immature Granulocytes: 0.43 10*3/uL — ABNORMAL HIGH (ref 0.00–0.07)
Basophils Absolute: 0.1 10*3/uL (ref 0.0–0.1)
Basophils Relative: 0 %
Eosinophils Absolute: 0 10*3/uL (ref 0.0–0.5)
Eosinophils Relative: 0 %
HCT: 42.5 % (ref 36.0–46.0)
Hemoglobin: 12 g/dL (ref 12.0–15.0)
Immature Granulocytes: 2 %
Lymphocytes Relative: 6 %
Lymphs Abs: 1.6 10*3/uL (ref 0.7–4.0)
MCH: 29.1 pg (ref 26.0–34.0)
MCHC: 28.2 g/dL — ABNORMAL LOW (ref 30.0–36.0)
MCV: 103.2 fL — ABNORMAL HIGH (ref 80.0–100.0)
Monocytes Absolute: 2.1 10*3/uL — ABNORMAL HIGH (ref 0.1–1.0)
Monocytes Relative: 8 %
Neutro Abs: 23.3 10*3/uL — ABNORMAL HIGH (ref 1.7–7.7)
Neutrophils Relative %: 84 %
Platelets: 159 10*3/uL (ref 150–400)
RBC: 4.12 MIL/uL (ref 3.87–5.11)
RDW: 19.9 % — ABNORMAL HIGH (ref 11.5–15.5)
WBC: 27.5 10*3/uL — ABNORMAL HIGH (ref 4.0–10.5)
nRBC: 3.5 % — ABNORMAL HIGH (ref 0.0–0.2)

## 2018-02-24 LAB — POCT I-STAT 3, ART BLOOD GAS (G3+)
Acid-base deficit: 12 mmol/L — ABNORMAL HIGH (ref 0.0–2.0)
Bicarbonate: 14.5 mmol/L — ABNORMAL LOW (ref 20.0–28.0)
O2 Saturation: 100 %
Patient temperature: 36.5
TCO2: 16 mmol/L — ABNORMAL LOW (ref 22–32)
pCO2 arterial: 34.9 mmHg (ref 32.0–48.0)
pH, Arterial: 7.223 — ABNORMAL LOW (ref 7.350–7.450)
pO2, Arterial: 249 mmHg — ABNORMAL HIGH (ref 83.0–108.0)

## 2018-02-24 LAB — LACTIC ACID, PLASMA
Lactic Acid, Venous: 7.8 mmol/L (ref 0.5–1.9)
Lactic Acid, Venous: 7.6 mmol/L (ref 0.5–1.9)
Lactic Acid, Venous: 5.3 mmol/L (ref 0.5–1.9)

## 2018-02-24 LAB — LIPASE, BLOOD: Lipase: 26 U/L (ref 11–51)

## 2018-02-24 LAB — TROPONIN I
Troponin I: 0.32 ng/mL (ref <0.03)
Troponin I: 0.52 ng/mL (ref <0.03)

## 2018-02-24 LAB — BASIC METABOLIC PANEL
Anion gap: >20 — ABNORMAL HIGH (ref 5–15)
BUN: 104 mg/dL — ABNORMAL HIGH (ref 8–23)
CO2: 8 mmol/L — ABNORMAL LOW (ref 22–32)
Calcium: 8.6 mg/dL — ABNORMAL LOW (ref 8.9–10.3)
Chloride: 110 mmol/L (ref 98–111)
Creatinine, Ser: 5.95 mg/dL — ABNORMAL HIGH (ref 0.44–1.00)
GFR calc Af Amer: 7 mL/min — ABNORMAL LOW (ref >60)
GFR calc non Af Amer: 6 mL/min — ABNORMAL LOW (ref >60)
Glucose, Bld: 165 mg/dL — ABNORMAL HIGH (ref 70–99)
Potassium: 7.2 mmol/L (ref 3.5–5.1)
Sodium: 139 mmol/L (ref 135–145)

## 2018-02-24 LAB — APTT: aPTT: 42 seconds — ABNORMAL HIGH (ref 24–36)

## 2018-02-24 LAB — CBG MONITORING, ED
Glucose-Capillary: 147 mg/dL — ABNORMAL HIGH (ref 70–99)
Glucose-Capillary: 30 mg/dL — CL (ref 70–99)
Glucose-Capillary: 97 mg/dL (ref 70–99)
Glucose-Capillary: 135 mg/dL — ABNORMAL HIGH (ref 70–99)
Glucose-Capillary: 290 mg/dL — ABNORMAL HIGH (ref 70–99)

## 2018-02-24 LAB — CBC
HCT: 39.8 % (ref 36.0–46.0)
Hemoglobin: 11.8 g/dL — ABNORMAL LOW (ref 12.0–15.0)
MCH: 29.1 pg (ref 26.0–34.0)
MCHC: 29.6 g/dL — ABNORMAL LOW (ref 30.0–36.0)
MCV: 98 fL (ref 80.0–100.0)
Platelets: 147 10*3/uL — ABNORMAL LOW (ref 150–400)
RBC: 4.06 MIL/uL (ref 3.87–5.11)
RDW: 19.6 % — ABNORMAL HIGH (ref 11.5–15.5)
WBC: 25.2 10*3/uL — ABNORMAL HIGH (ref 4.0–10.5)
nRBC: 5.9 % — ABNORMAL HIGH (ref 0.0–0.2)

## 2018-02-24 LAB — BLOOD GAS, VENOUS
Acid-base deficit: 19.8 mmol/L — ABNORMAL HIGH (ref 0.0–2.0)
Bicarbonate: 8.7 mmol/L — ABNORMAL LOW (ref 20.0–28.0)
FIO2: 2
O2 Saturation: 56.8 %
Patient temperature: 37
pCO2, Ven: 38.5 mmHg — ABNORMAL LOW (ref 44.0–60.0)
pH, Ven: 7.003 — CL (ref 7.250–7.430)
pO2, Ven: 42 mmHg (ref 32.0–45.0)

## 2018-02-24 LAB — GLUCOSE, CAPILLARY
Glucose-Capillary: 241 mg/dL — ABNORMAL HIGH (ref 70–99)
Glucose-Capillary: 230 mg/dL — ABNORMAL HIGH (ref 70–99)

## 2018-02-24 LAB — BRAIN NATRIURETIC PEPTIDE: B Natriuretic Peptide: 2594 pg/mL — ABNORMAL HIGH (ref 0.0–100.0)

## 2018-02-24 LAB — MRSA PCR SCREENING: MRSA by PCR: POSITIVE — AB

## 2018-02-24 MED ORDER — SODIUM ZIRCONIUM CYCLOSILICATE 10 G PO PACK
10.0000 g | PACK | Freq: Once | ORAL | Status: AC
  Administered 2018-02-24: 10 g via ORAL
  Filled 2018-02-24: qty 1

## 2018-02-24 MED ORDER — INSULIN ASPART 100 UNIT/ML ~~LOC~~ SOLN
SUBCUTANEOUS | Status: AC
  Filled 2018-02-24: qty 1

## 2018-02-24 MED ORDER — ORAL CARE MOUTH RINSE
15.0000 mL | OROMUCOSAL | Status: DC
  Administered 2018-02-25 – 2018-03-07 (×98): 15 mL via OROMUCOSAL

## 2018-02-24 MED ORDER — MIDAZOLAM HCL 2 MG/2ML IJ SOLN
1.0000 mg | INTRAMUSCULAR | Status: DC | PRN
  Administered 2018-02-28 – 2018-03-03 (×5): 1 mg via INTRAVENOUS
  Filled 2018-02-24 (×4): qty 2

## 2018-02-24 MED ORDER — PHENYLEPHRINE HCL-NACL 40-0.9 MG/250ML-% IV SOLN
0.0000 ug/min | INTRAVENOUS | Status: DC
  Administered 2018-02-24: 100 ug/min via INTRAVENOUS
  Administered 2018-02-25 (×2): 375 ug/min via INTRAVENOUS
  Administered 2018-02-25: 250 ug/min via INTRAVENOUS
  Administered 2018-02-25: 53.333 ug/min via INTRAVENOUS
  Filled 2018-02-24 (×8): qty 250

## 2018-02-24 MED ORDER — PANTOPRAZOLE SODIUM 40 MG IV SOLR
40.0000 mg | Freq: Every day | INTRAVENOUS | Status: DC
  Administered 2018-02-24 – 2018-02-28 (×5): 40 mg via INTRAVENOUS
  Filled 2018-02-24 (×5): qty 40

## 2018-02-24 MED ORDER — DEXTROSE 50 % IV SOLN
INTRAVENOUS | Status: AC
  Filled 2018-02-24: qty 50

## 2018-02-24 MED ORDER — STERILE WATER FOR INJECTION IV SOLN
INTRAVENOUS | Status: DC
  Administered 2018-02-24 – 2018-02-27 (×9): via INTRAVENOUS_CENTRAL
  Filled 2018-02-24 (×14): qty 150
  Filled 2018-02-24: qty 100

## 2018-02-24 MED ORDER — FENTANYL 2500MCG IN NS 250ML (10MCG/ML) PREMIX INFUSION
25.0000 ug/h | INTRAVENOUS | Status: DC
  Administered 2018-02-24: 50 ug/h via INTRAVENOUS
  Administered 2018-02-25: 175 ug/h via INTRAVENOUS
  Administered 2018-02-26: 275 ug/h via INTRAVENOUS
  Administered 2018-02-27: 200 ug/h via INTRAVENOUS
  Filled 2018-02-24 (×4): qty 250

## 2018-02-24 MED ORDER — HEPARIN SODIUM (PORCINE) 1000 UNIT/ML DIALYSIS
1000.0000 [IU] | INTRAMUSCULAR | Status: DC | PRN
  Administered 2018-03-01 – 2018-03-03 (×2): 2400 [IU] via INTRAVENOUS_CENTRAL
  Filled 2018-02-24: qty 6
  Filled 2018-02-24: qty 2
  Filled 2018-02-24: qty 6
  Filled 2018-02-24: qty 4
  Filled 2018-02-24 (×3): qty 6

## 2018-02-24 MED ORDER — ETOMIDATE 2 MG/ML IV SOLN
20.0000 mg | Freq: Once | INTRAVENOUS | Status: AC
  Administered 2018-02-24: 20 mg via INTRAVENOUS

## 2018-02-24 MED ORDER — PIPERACILLIN-TAZOBACTAM IN DEX 2-0.25 GM/50ML IV SOLN
2.2500 g | Freq: Three times a day (TID) | INTRAVENOUS | Status: DC
  Filled 2018-02-24 (×2): qty 50

## 2018-02-24 MED ORDER — CHLORHEXIDINE GLUCONATE CLOTH 2 % EX PADS
6.0000 | MEDICATED_PAD | Freq: Every day | CUTANEOUS | Status: AC
  Administered 2018-02-26 – 2018-02-28 (×4): 6 via TOPICAL

## 2018-02-24 MED ORDER — FENTANYL CITRATE (PF) 100 MCG/2ML IJ SOLN
50.0000 ug | Freq: Once | INTRAMUSCULAR | Status: DC
  Filled 2018-02-24: qty 2

## 2018-02-24 MED ORDER — SODIUM BICARBONATE 8.4 % IV SOLN
100.0000 meq | Freq: Once | INTRAVENOUS | Status: AC
  Administered 2018-02-24: 100 meq via INTRAVENOUS

## 2018-02-24 MED ORDER — FENTANYL CITRATE (PF) 100 MCG/2ML IJ SOLN
INTRAMUSCULAR | Status: AC
  Administered 2018-02-24: 25 ug via INTRAVENOUS
  Filled 2018-02-24: qty 2

## 2018-02-24 MED ORDER — STERILE WATER FOR INJECTION IV SOLN
INTRAVENOUS | Status: DC
  Administered 2018-02-24 – 2018-02-27 (×8): via INTRAVENOUS_CENTRAL
  Filled 2018-02-24 (×18): qty 150

## 2018-02-24 MED ORDER — MIDAZOLAM HCL 2 MG/2ML IJ SOLN
INTRAMUSCULAR | Status: AC
  Administered 2018-02-24: 1 mg via INTRAVENOUS
  Filled 2018-02-24: qty 2

## 2018-02-24 MED ORDER — FENTANYL BOLUS VIA INFUSION
25.0000 ug | INTRAVENOUS | Status: DC | PRN
  Administered 2018-02-24: 25 ug via INTRAVENOUS
  Filled 2018-02-24: qty 25

## 2018-02-24 MED ORDER — INSULIN ASPART 100 UNIT/ML ~~LOC~~ SOLN
5.0000 [IU] | Freq: Once | SUBCUTANEOUS | Status: AC
  Administered 2018-02-24: 5 [IU] via SUBCUTANEOUS
  Filled 2018-02-24: qty 1

## 2018-02-24 MED ORDER — DEXTROSE 50 % IV SOLN
1.0000 | Freq: Once | INTRAVENOUS | Status: AC
  Administered 2018-02-24: 50 mL via INTRAVENOUS

## 2018-02-24 MED ORDER — SODIUM CHLORIDE 0.9 % IV BOLUS
500.0000 mL | Freq: Once | INTRAVENOUS | Status: AC
  Administered 2018-02-24: 500 mL via INTRAVENOUS

## 2018-02-24 MED ORDER — PRISMASOL BGK 0/2.5 32-2.5 MEQ/L IV SOLN
INTRAVENOUS | Status: DC
  Administered 2018-02-24 – 2018-02-26 (×8): via INTRAVENOUS_CENTRAL
  Filled 2018-02-24 (×11): qty 5000

## 2018-02-24 MED ORDER — SODIUM BICARBONATE 8.4 % IV SOLN
INTRAVENOUS | Status: AC
  Administered 2018-02-24: 100 meq via INTRAVENOUS
  Filled 2018-02-24: qty 100

## 2018-02-24 MED ORDER — MIDAZOLAM HCL 2 MG/2ML IJ SOLN
1.0000 mg | INTRAMUSCULAR | Status: DC | PRN
  Filled 2018-02-24: qty 2

## 2018-02-24 MED ORDER — CHLORHEXIDINE GLUCONATE 0.12% ORAL RINSE (MEDLINE KIT)
15.0000 mL | Freq: Two times a day (BID) | OROMUCOSAL | Status: DC
  Administered 2018-02-25 – 2018-03-07 (×20): 15 mL via OROMUCOSAL

## 2018-02-24 MED ORDER — INSULIN ASPART 100 UNIT/ML IV SOLN
5.0000 [IU] | Freq: Once | INTRAVENOUS | Status: AC
  Administered 2018-02-24: 5 [IU] via INTRAVENOUS

## 2018-02-24 MED ORDER — STERILE WATER FOR INJECTION IV SOLN
INTRAVENOUS | Status: DC
  Administered 2018-02-24: 23:00:00 via INTRAVENOUS
  Filled 2018-02-24 (×3): qty 850

## 2018-02-24 MED ORDER — HEPARIN (PORCINE) 25000 UT/250ML-% IV SOLN
850.0000 [IU]/h | INTRAVENOUS | Status: DC
  Administered 2018-02-25: 1100 [IU]/h via INTRAVENOUS
  Filled 2018-02-24: qty 250

## 2018-02-24 MED ORDER — DEXTROSE-NACL 5-0.45 % IV SOLN
INTRAVENOUS | Status: DC
  Administered 2018-02-24: 16:00:00 via INTRAVENOUS

## 2018-02-24 MED ORDER — CALCIUM GLUCONATE-NACL 1-0.675 GM/50ML-% IV SOLN
1.0000 g | Freq: Once | INTRAVENOUS | Status: AC
  Administered 2018-02-24: 1000 mg via INTRAVENOUS
  Filled 2018-02-24: qty 50

## 2018-02-24 MED ORDER — SODIUM BICARBONATE 8.4 % IV SOLN
Freq: Once | INTRAVENOUS | Status: DC
  Administered 2018-02-24: 18:00:00 via INTRAVENOUS
  Filled 2018-02-24: qty 100

## 2018-02-24 MED ORDER — MIDAZOLAM HCL 2 MG/2ML IJ SOLN
1.0000 mg | Freq: Once | INTRAMUSCULAR | Status: AC
  Administered 2018-02-24: 1 mg via INTRAVENOUS

## 2018-02-24 MED ORDER — SODIUM CHLORIDE 0.9 % FOR CRRT
INTRAVENOUS_CENTRAL | Status: DC | PRN
  Administered 2018-02-24: 23:00:00 via INTRAVENOUS_CENTRAL
  Filled 2018-02-24: qty 1000

## 2018-02-24 MED ORDER — INSULIN REGULAR NICU BOLUS VIA INFUSION: 5.0000 [IU] | Freq: Once | INTRAVENOUS | Status: DC

## 2018-02-24 MED ORDER — INSULIN ASPART 100 UNIT/ML ~~LOC~~ SOLN
0.0000 [IU] | SUBCUTANEOUS | Status: DC
  Administered 2018-02-25: 1 [IU] via SUBCUTANEOUS
  Administered 2018-02-25 (×2): 3 [IU] via SUBCUTANEOUS
  Administered 2018-02-26 (×3): 2 [IU] via SUBCUTANEOUS
  Administered 2018-02-26: 1 [IU] via SUBCUTANEOUS
  Administered 2018-02-26: 2 [IU] via SUBCUTANEOUS
  Administered 2018-02-26: 1 [IU] via SUBCUTANEOUS
  Administered 2018-02-27: 2 [IU] via SUBCUTANEOUS
  Administered 2018-02-27: 1 [IU] via SUBCUTANEOUS
  Administered 2018-02-27: 2 [IU] via SUBCUTANEOUS
  Administered 2018-02-27: 1 [IU] via SUBCUTANEOUS
  Administered 2018-02-27 – 2018-02-28 (×4): 2 [IU] via SUBCUTANEOUS
  Administered 2018-02-28: 1 [IU] via SUBCUTANEOUS
  Administered 2018-02-28 – 2018-03-01 (×4): 2 [IU] via SUBCUTANEOUS
  Administered 2018-03-01: 3 [IU] via SUBCUTANEOUS
  Administered 2018-03-01 – 2018-03-02 (×3): 2 [IU] via SUBCUTANEOUS
  Administered 2018-03-02 (×2): 3 [IU] via SUBCUTANEOUS
  Administered 2018-03-02: 5 [IU] via SUBCUTANEOUS
  Administered 2018-03-02 (×2): 3 [IU] via SUBCUTANEOUS
  Administered 2018-03-03: 2 [IU] via SUBCUTANEOUS
  Administered 2018-03-03: 3 [IU] via SUBCUTANEOUS
  Administered 2018-03-03: 2 [IU] via SUBCUTANEOUS

## 2018-02-24 MED ORDER — ALBUTEROL SULFATE (2.5 MG/3ML) 0.083% IN NEBU
10.0000 mg | INHALATION_SOLUTION | Freq: Once | RESPIRATORY_TRACT | Status: AC
  Administered 2018-02-24: 10 mg via RESPIRATORY_TRACT
  Filled 2018-02-24: qty 12

## 2018-02-24 MED ORDER — SODIUM CHLORIDE 0.9 % IV SOLN: 1.0000 g | Freq: Once | INTRAVENOUS | Status: DC

## 2018-02-24 MED ORDER — MUPIROCIN 2 % EX OINT
1.0000 "application " | TOPICAL_OINTMENT | Freq: Two times a day (BID) | CUTANEOUS | Status: AC
  Administered 2018-02-25 – 2018-03-01 (×10): 1 via NASAL
  Filled 2018-02-24: qty 22

## 2018-02-24 MED ORDER — DEXTROSE 50 % IV SOLN
INTRAVENOUS | Status: AC
  Administered 2018-02-24: 15:00:00
  Filled 2018-02-24: qty 50

## 2018-02-24 MED ORDER — PIPERACILLIN-TAZOBACTAM IN DEX 2-0.25 GM/50ML IV SOLN
2.2500 g | Freq: Four times a day (QID) | INTRAVENOUS | Status: DC
  Administered 2018-02-25 – 2018-02-26 (×5): 2.25 g via INTRAVENOUS
  Filled 2018-02-24 (×7): qty 50

## 2018-02-24 MED ORDER — DEXTROSE 50 % IV SOLN
25.0000 mL | Freq: Once | INTRAVENOUS | Status: AC
  Administered 2018-02-24: 25 mL via INTRAVENOUS

## 2018-02-24 MED ORDER — FENTANYL CITRATE (PF) 100 MCG/2ML IJ SOLN
25.0000 ug | Freq: Once | INTRAMUSCULAR | Status: AC
  Administered 2018-02-24: 25 ug via INTRAVENOUS

## 2018-02-24 NOTE — ED Notes (Signed)
Date and time results received: 02/26/2018 5:48 PM  (use smartphrase ".now" to insert current time)  Test: K+ Critical Value: 7.2  Name of Provider Notified: Eulis Foster  Orders Received? Or Actions Taken?: Orders Received - See Orders for details

## 2018-02-24 NOTE — ED Notes (Signed)
Per Daughter, pt was diagnosed with Stage 4 kidney failure. Also that abdomen is more distended than normal.

## 2018-02-24 NOTE — H&P (Addendum)
NAME:  [name] [name], MRN:  [id number], DOB:  1930-05-22, LOS: 0 ADMISSION DATE:  02/20/2018, CONSULTATION DATE:  02/27/2018 REFERRING MD:  Martin Majestic - [facility]  CHIEF COMPLAINT:  SOB   Brief History   [name] [name] is a 85 y.o. female who presented to [facility] with SOB, cough, weakness.  Found to have multiple metabolic derangements including AoCKD with SCr 6, BUN 445 (165 on repeat), K 7.2.  She received temporizing measures and she was transferred to [facility] for further management with possibility of emergent HD.  History of present illness   [name] [name] is a 85 y.o. female who has a PMH as outlined below (see "past medical history").  She presented to [facility] ED 1/20 with SOB, cough, weakness.  On EMS arrival, she was found to be hypoglycemic to around 50.  She received oral glucose and IM glucagon.  She was taken to ED where she remained with generalized weakness.  She was found to have multiple metabolic derangements including K 7.2, SCr 6.03, BUN 445 (165 on repeat), AG 20, BNP 2594, Trop 0.32, lactate 7.8, WBC 27.5.  ABG demonstrated metabolic + respiratory acidosis (7.0 / 38 / 42).  She was transferred to [facility] for further management with possibility of emergent HD.  After arrival to [facility], she was hypotensive with SBP in 70s.  Additionally, she had kussmaul respirations and although she was responsive, she was slow to respond.  She was started on BiPAP but later required intubation for continued respiratory insufficiency.  Past Medical History  HTN, HLD, CHF, A.fib, CKD, DM, diverticulosis, GERD.  Significant Hospital Events   1/20 > admit.  Consults:  Nephrology.  Procedures:  ETT 1/20 >  R IJ HD cath 1/20 >   Significant Diagnostic Tests:  CXR 1/20 > CM with pulmonary edema. Echo 1/20 >   Micro Data:  Blood 1/20 > Sputum 1/20 >  Urine 1/20 >   Antimicrobials:  Zosyn 1/20 >   Interim history/subjective:  Awake but having respiratory distress.  Objective:  Blood pressure 110/71, pulse  (!) 137, temperature 98.2 F (36.8 C), resp. rate (!) 31, height 5\' 7"  (1.702 m), weight 83.5 kg, SpO2 100 %.       No intake or output data in the 24 hours ending 03/07/2018 1956 Filed Weights   03/03/2018 1454  Weight: 83.5 kg    Examination: General: Adult female, in respiratory distress. Neuro: Awake, follows some basic commands intermittently. HEENT: [place]/AT. Sclerae anicteric.  EOMI. Cardiovascular: RRR, no M/R/G.  Lungs: Respirations even and unlabored.  Diminished bilaterally. Abdomen: Obese.  BS x 4, soft, NT/ND.  Musculoskeletal: No gross deformities, no edema.  Skin: Intact, warm, no rashes.  Assessment & Plan:   Respiratory insufficiency. - Intubate now. - Bronchial hygiene. - Assess ABG, CXR.  Hypotension - ? Unclear etiology at this point. - Neosynephrine as needed for goal MAP > 65. - Continue supportive care. - Assess echo.  AoCKD (baseline SCr ~ 2) with life threatening hyperkalemia (s/p temporizing measures) - now with SCr 6, BUN 445, K 7.2. AGMA - lactate + renal failure. - Repeat STAT labs. - Nephrology consulted, will start CRRT tonight. - Follow BMP, lactate.  Transaminitis. - Trend LFT's.  Elevated troponin - suspect demand. - Trend troponins.  Leukocytosis - no obvious source of infection, but can not rule out at this point. - Empiric zosyn for now.  - Hold vanc given AoCKD. - Follow cultures.  Hx HTN, HLD, CHF (Echo from  Sept 2019 with EF 45-50%), A.fib (on apixaban). - Heparin in lieu of preadmission apixaban. - Hold preadmission apixaban, furosemide, toprol-xl, pravastatin.  Hx DM. - SSI. - Hold preadmission glimepiride.  Hx gout. - Hold preadmission allopurinol.   Best Practice:  Diet: NPO. Pain/Anxiety/Delirium protocol (if indicated): Fentanyl gtt / Midazolam PRN.  RASS goal 0 to -1. VAP protocol (if indicated): In place. DVT prophylaxis: SCD's / Heparin. GI prophylaxis: PPI. Glucose control: SSI. Mobility: Bedrest. Code  Status: Full. Family Communication: None available. Disposition: ICU.  Labs   CBC: Recent Labs  Lab 02/07/2018 1507  WBC 27.5*  NEUTROABS 23.3*  HGB 12.0  HCT 42.5  MCV 103.2*  PLT 409   Basic Metabolic Panel: Recent Labs  Lab 02/14/2018 1507 02/28/2018 1708  NA 137 139  K 6.9* 7.2*  CL 109 110  CO2 8* 8*  GLUCOSE 182* 165*  BUN 445* 104*  CREATININE 6.03* 5.95*  CALCIUM 8.5* 8.6*   GFR: Estimated Creatinine Clearance: 7.7 mL/min (A) (by C-G formula based on SCr of 5.95 mg/dL (H)). Recent Labs  Lab 03/03/2018 1507 02/10/2018 1755  WBC 27.5*  --   LATICACIDVEN 7.8* 7.6*   Liver Function Tests: Recent Labs  Lab 03/01/2018 1507  AST 644*  ALT 295*  ALKPHOS 93  BILITOT 2.6*  PROT 7.3  ALBUMIN 3.8   Recent Labs  Lab 02/21/2018 1507  LIPASE 26   No results for input(s): AMMONIA in the last 168 hours. ABG    Component Value Date/Time   PHART 7.612 (HH) 08/14/2016 1740   PCO2ART BELOW REPORTABLE RANGE 08/14/2016 1740   PO2ART 213.0 (H) 08/14/2016 1740   HCO3 8.7 (L) 02/23/2018 1507   TCO2 24 09/16/2010 1404   ACIDBASEDEF 19.8 (H) 02/21/2018 1507   O2SAT 56.8 03/07/2018 1507    Coagulation Profile: No results for input(s): INR, PROTIME in the last 168 hours. Cardiac Enzymes: Recent Labs  Lab 02/14/2018 1507  TROPONINI 0.32*   HbA1C: Hgb A1c MFr Bld  Date/Time Value Ref Range Status  10/23/2017 03:06 PM 7.9 (H) 4.8 - 5.6 % Final    Comment:    (NOTE) Pre diabetes:          5.7%-6.4% Diabetes:              >6.4% Glycemic control for   <7.0% adults with diabetes   09/17/2010 05:27 AM 6.3 (H) <5.7 % Final    Comment:    (NOTE)                                                                       According to the ADA Clinical Practice Recommendations for 2011, when HbA1c is used as a screening test:  >=6.5%   Diagnostic of Diabetes Mellitus           (if abnormal result is confirmed) 5.7-6.4%   Increased risk of developing Diabetes  [place] References:Diagnosis and Classification of Diabetes Mellitus,Diabetes WJXB,1478,29(FAOZH 1):S62-S69 and Standards of Medical Care in         Diabetes - 2011,Diabetes [id number],7846,96 (Suppl 1):S11-S61.   CBG: Recent Labs  Lab 03/03/2018 1445 02/17/2018 1458 02/25/2018 1518 02/12/2018 1634 02/23/2018 1827  GLUCAP 30* 147* 97 135* 290*    Review of Systems:  Unable to obtain as pt is encephalopathic.  Past medical history  She,  has a past medical history of Acid reflux, ARF (acute renal failure) ([place]) (09/16/2010), Atrial fibrillation ([place]), CHF (congestive heart failure) ([place]), Chronic anticoagulation, CKD (chronic kidney disease) stage 3, GFR 30-59 ml/min ([place]), Diabetes mellitus without complication ([place]), Diverticular hemorrhage (2011), Diverticulosis (2011), GI bleeding (08/03/2011), Gout, medication noncompliance, Hypercholesterolemia, Hyperglycemia, Hypertension, Internal hemorrhoids (2011), Seasonal allergies, and Thrombocytopenia due to drugs.   Surgical History    Past Surgical History:  Procedure Laterality Date  . ADENOIDECTOMY    . APPENDECTOMY    . BUNIONECTOMY    . CATARACT EXTRACTION    . CESAREAN SECTION    . ESOPHAGOGASTRODUODENOSCOPY  08/20/09   small hiatal hernia/mild gastritis  . ileocolonoscopy  08/20/09   normal terminal ileum/pancolonic diverticula/no polyps/benign colon mucosa  . JOINT REPLACEMENT     Total right knee 2007  . LAPAROTOMY  09/22/2010   Procedure: EXPLORATORY LAPAROTOMY;  Surgeon: [name];  Location: [facility];  Service: General;  Laterality: N/A;  Lysis of Adhesions  . TONSILLECTOMY    . TUBAL LIGATION       Social History   reports that she has never smoked. She has never used smokeless tobacco. She reports that she does not drink alcohol or use drugs.   Family history   Her family history includes Diabetes in her mother; Heart disease in her mother.   Allergies Allergies  Allergen Reactions  . Codeine Nausea And Vomiting  .  Sulfa Antibiotics Nausea And Vomiting  . Sulfur Nausea And Vomiting     Home meds  Prior to Admission medications   Medication Sig Start Date End Date Taking? Authorizing Provider  acetaminophen (TYLENOL) 325 MG tablet Take 2 tablets (650 mg total) by mouth every 4 (four) hours as needed for headache or mild pain. 10/27/17  Yes Emokpae, Courage, MD  allopurinol (ZYLOPRIM) 100 MG tablet Take 100 mg by mouth daily.   Yes [provider]  amitriptyline (ELAVIL) 25 MG tablet Take 25 mg by mouth at bedtime as needed for sleep.  02/14/18  Yes [provider]  apixaban (ELIQUIS) 2.5 MG TABS tablet Take 1 tablet (2.5 mg total) by mouth 2 (two) times daily. 10/27/17  Yes Emokpae, Courage, MD  Artificial Tear Ointment (DRY EYES OP) Place 1 drop into both eyes as needed (dry eyes).   Yes [provider]  ferrous sulfate 325 (65 FE) MG tablet Take 325 mg by mouth 2 (two) times daily with a meal.    Yes [provider]  fluticasone (FLONASE) 50 MCG/ACT nasal spray Place 1 spray into both nostrils daily.   Yes [provider]  furosemide (LASIX) 40 MG tablet Take 1 tablet (40 mg total) by mouth daily. Patient taking differently: Take 20 mg by mouth daily.  10/27/17  Yes Emokpae, Courage, MD  glimepiride (AMARYL) 2 MG tablet Take 1 tablet (2 mg total) by mouth daily with breakfast. 10/28/17  Yes Emokpae, Courage, MD  levalbuterol (XOPENEX) 1.25 MG/3ML nebulizer solution Take 1.25 mg by nebulization every 4 (four) hours as needed for wheezing. 09/10/17  Yes Valentina Shaggy, MD  loratadine (CLARITIN) 10 MG tablet Take 10 mg by mouth daily.   Yes [provider]  metoprolol succinate (TOPROL-XL) 25 MG 24 hr tablet Take 25 mg by mouth every morning.   Yes [provider]  montelukast (SINGULAIR) 10 MG tablet Take 10 mg by mouth at bedtime.    Yes  [provider]  Multiple Vitamin (MULTIVITAMIN WITH MINERALS) TABS tablet Take 1 tablet by mouth  daily.   Yes [provider]  omeprazole (PRILOSEC) 20 MG capsule Take 1 capsule (20 mg total) by mouth daily. Patient taking differently: Take 20 mg by mouth every morning.  06/19/16  Yes Maczis, Barth Kirks, PA-C  potassium chloride SA (K-DUR,KLOR-CON) 20 MEQ tablet Take 1 tablet (20 mEq total) by mouth daily. 04/01/16  Yes Isaac Bliss, Rayford Halsted, MD  pravastatin (PRAVACHOL) 40 MG tablet Take 40 mg by mouth every evening.    Yes [provider]  sodium chloride (OCEAN) 0.65 % SOLN nasal spray Place 1 spray into both nostrils as needed for congestion.   Yes [provider]  Vitamin D, Ergocalciferol, (DRISDOL) 50000 units CAPS capsule Take 50,000 Units by mouth every 30 (thirty) days. Administered on the 1st of each month   Yes [provider]    Critical care time: 45 min.    Montey Hora, [facility] Pager: [phone].  If no answer, [phone] 03/04/2018, 7:56 PM

## 2018-02-24 NOTE — Progress Notes (Signed)
eLink Physician-Brief Progress Note Patient Name: [name] [name] DOB: [birth year] MRN: [id number]   Date of Service  02/19/2018  HPI/Events of Note  Patient is on a Phenylephrine IV infusion. Cuff BP unreliable. Request for A-line.   eICU Interventions  Will order: 1. Respiratory Therapy to place A-line.      Intervention Category Major Interventions: Hypotension - evaluation and management  Jakob Kimberlin Cornelia Copa 02/07/2018, 11:47 PM

## 2018-02-24 NOTE — ED Notes (Signed)
Date and time results received: 02/26/2018 4:18 PM  (use smartphrase ".now" to insert current time)  Test: K+ Critical Value: 6.9  Name of Provider Notified: Eulis Foster  Orders Received? Or Actions Taken?: Orders Received - See Orders for details

## 2018-02-24 NOTE — Consult Note (Signed)
Referring Provider: No ref. provider found Primary Care Physician:  Vidal Schwalbe, MD Primary Nephrologist:   Reason for Consultation: Acute on chronic renal failure, hypotension, metabolic acidosis, congestive heart failure with systolic dysfunction, lactic acidosis and shock, diabetes mellitus type 52  HPI: 85 year old lady history of chronic kidney disease, appears to be followed at [facility] primarily.  She has a history of atrial fibrillation chronic kidney disease stage IV secondary hyperparathyroidism, pulmonary hypertension and bilateral lower extremity edema she has congestive heart failure and type 2 diabetes.  She has a history of atrial fibrillation and is using Eliquis.  She has hyperlipidemia taking pravastatin.  She appears to take Lasix 20 mg daily and lisinopril 10 mg daily.  She has a history of lower extremity edema.  It appears that her lost encounter with cardiology was in October 2019 and she was seen by [facility].  She has been evaluated in February 2019 by neurology for cognitive decline.  CT scan of the head in the past February 2018 revealed diffuse cortical atrophy.  Records indicate in September 2019 her serum creatinine was measured at 2.73 GFR 15 cc/min.  There does not appear to have been any planning for dialysis at that time.  She was transferred from [facility].  She presented with cough shortness of breath and weakness.  She was also noted to be hypoglycemic receiving oral glucose and IM glucagon.  Labs in the emergency room revealed that she had hypokalemic metabolic acidosis with acute on chronic renal failure.  She was transferred to [facility] in anticipation that she would need hemodialysis.  A temporary dialysis catheter was placed in the right IJ.  Vital signs on transfer blood pressure 91/44 pulse 101 temperature 98.4 O2 sats 99% 2 L nasal cannula.  Sodium 139 potassium 7.2 chloride 110 CO2 8 BUN 165 creatinine 5.95  calcium 8.6 glucose 165.  Lactic acid 7.6 WBC 27.5 hemoglobin 12.0 platelets 159.  Chest x-ray pulmonary edema with cardiomegaly.  Last 2D echo 45 to 50% ejection fraction.  She is unable to provide any reliable history and family are not available.  She was started on empiric Zosyn.    Past Medical History:  Diagnosis Date  . Acid reflux   . ARF (acute renal failure) ([place]) 09/16/2010   Prerenal. Creatinine 1.36 before discharge.  . Atrial fibrillation ([place])   . CHF (congestive heart failure) (HCC)    Hx of diastolic CHF.  Marland Kitchen Chronic anticoagulation   . CKD (chronic kidney disease) stage 3, GFR 30-59 ml/min (HCC)   . Diabetes mellitus without complication ([place])   . Diverticular hemorrhage 2011   Per colonoscopy  . Diverticulosis 2011   Per colonoscopy  . GI bleeding 08/03/2011   Not scoped but likely secondary to hemorrhoids or diverticulosis  . Gout   . Hx of medication noncompliance   . Hypercholesterolemia   . Hyperglycemia    Query DM 2  . Hypertension   . Internal hemorrhoids 2011   Per colonoscopy  . Seasonal allergies   . Thrombocytopenia due to drugs    Allopurinol    Past Surgical History:  Procedure Laterality Date  . ADENOIDECTOMY    . APPENDECTOMY    . BUNIONECTOMY    . CATARACT EXTRACTION    . CESAREAN SECTION    . ESOPHAGOGASTRODUODENOSCOPY  08/20/09   small hiatal hernia/mild gastritis  . ileocolonoscopy  08/20/09   normal terminal ileum/pancolonic diverticula/no polyps/benign colon mucosa  . JOINT REPLACEMENT  Total right knee 2007  . LAPAROTOMY  09/22/2010   Procedure: EXPLORATORY LAPAROTOMY;  Surgeon: [name];  Location: [facility];  Service: General;  Laterality: N/A;  Lysis of Adhesions  . TONSILLECTOMY    . TUBAL LIGATION      Prior to Admission medications   Medication Sig Start Date End Date Taking? Authorizing Provider  acetaminophen (TYLENOL) 325 MG tablet Take 2 tablets (650 mg total) by mouth every 4 (four) hours as needed for  headache or mild pain. 10/27/17  Yes Emokpae, Courage, MD  allopurinol (ZYLOPRIM) 100 MG tablet Take 100 mg by mouth daily.   Yes [provider]  amitriptyline (ELAVIL) 25 MG tablet Take 25 mg by mouth at bedtime as needed for sleep.  02/14/18  Yes [provider]  apixaban (ELIQUIS) 2.5 MG TABS tablet Take 1 tablet (2.5 mg total) by mouth 2 (two) times daily. 10/27/17  Yes Emokpae, Courage, MD  Artificial Tear Ointment (DRY EYES OP) Place 1 drop into both eyes as needed (dry eyes).   Yes [provider]  ferrous sulfate 325 (65 FE) MG tablet Take 325 mg by mouth 2 (two) times daily with a meal.    Yes [provider]  fluticasone (FLONASE) 50 MCG/ACT nasal spray Place 1 spray into both nostrils daily.   Yes [provider]  furosemide (LASIX) 40 MG tablet Take 1 tablet (40 mg total) by mouth daily. Patient taking differently: Take 20 mg by mouth daily.  10/27/17  Yes Emokpae, Courage, MD  glimepiride (AMARYL) 2 MG tablet Take 1 tablet (2 mg total) by mouth daily with breakfast. 10/28/17  Yes Emokpae, Courage, MD  levalbuterol (XOPENEX) 1.25 MG/3ML nebulizer solution Take 1.25 mg by nebulization every 4 (four) hours as needed for wheezing. 09/10/17  Yes Valentina Shaggy, MD  loratadine (CLARITIN) 10 MG tablet Take 10 mg by mouth daily.   Yes [provider]  metoprolol succinate (TOPROL-XL) 25 MG 24 hr tablet Take 25 mg by mouth every morning.   Yes [provider]  montelukast (SINGULAIR) 10 MG tablet Take 10 mg by mouth at bedtime.    Yes [provider]  Multiple Vitamin (MULTIVITAMIN WITH MINERALS) TABS tablet Take 1 tablet by mouth daily.   Yes [provider]  omeprazole (PRILOSEC) 20 MG capsule Take 1 capsule (20 mg total) by mouth daily. Patient taking differently: Take 20 mg by mouth every morning.  06/19/16  Yes Maczis, Barth Kirks, PA-C  potassium chloride SA (K-DUR,KLOR-CON) 20 MEQ tablet Take 1 tablet (20 mEq  total) by mouth daily. 04/01/16  Yes Isaac Bliss, Rayford Halsted, MD  pravastatin (PRAVACHOL) 40 MG tablet Take 40 mg by mouth every evening.    Yes [provider]  sodium chloride (OCEAN) 0.65 % SOLN nasal spray Place 1 spray into both nostrils as needed for congestion.   Yes [provider]  Vitamin D, Ergocalciferol, (DRISDOL) 50000 units CAPS capsule Take 50,000 Units by mouth every 30 (thirty) days. Administered on the 1st of each month   Yes [provider]    Current Facility-Administered Medications  Medication Dose Route Frequency Provider Last Rate Last Dose  . fentaNYL (SUBLIMAZE) 100 MCG/2ML injection           . fentaNYL (SUBLIMAZE) bolus via infusion 25 mcg  25 mcg Intravenous Q1H PRN Desai, Rahul P, PA-C      . fentaNYL (SUBLIMAZE) injection 50 mcg  50 mcg Intravenous Once Desai, Rahul P, PA-C      .  fentaNYL 2550mcg in NS 246mL (66mcg/ml) infusion-PREMIX  25-400 mcg/hr Intravenous Continuous Desai, Rahul P, PA-C      . insulin aspart (novoLOG) injection 0-9 Units  0-9 Units Subcutaneous Q4H Desai, Rahul P, PA-C      . midazolam (VERSED) 2 MG/2ML injection           . midazolam (VERSED) injection 1 mg  1 mg Intravenous Q15 min PRN Desai, Rahul P, PA-C      . midazolam (VERSED) injection 1 mg  1 mg Intravenous Q2H PRN Desai, Rahul P, PA-C      . pantoprazole (PROTONIX) injection 40 mg  40 mg Intravenous Daily Desai, Rahul P, PA-C      . phenylephrine (NEOSYNEPHRINE) 40-0.9 MG/250ML-% infusion  0-400 mcg/min Intravenous Titrated Desai, Rahul P, PA-C      . sodium bicarbonate 1 mEq/mL injection           . sodium bicarbonate 150 mEq in sterile water 1,000 mL infusion   Intravenous Continuous Desai, Rahul P, PA-C        Allergies as of 02/12/2018 - Review Complete 02/26/2018  Allergen Reaction Noted  . Codeine Nausea And Vomiting 09/16/2010  . Sulfa antibiotics Nausea And Vomiting 09/16/2010  . Sulfur Nausea And Vomiting 06/03/2014    Family History   Problem Relation Age of Onset  . Heart disease Mother   . Diabetes Mother     Social History   Socioeconomic History  . Marital status: Widowed    Spouse name: Not on file  . Number of children: Not on file  . Years of education: Not on file  . Highest education level: Not on file  Occupational History  . Not on file  Social Needs  . Financial resource strain: Not on file  . Food insecurity:    Worry: Not on file    Inability: Not on file  . Transportation needs:    Medical: Not on file    Non-medical: Not on file  Tobacco Use  . Smoking status: Never Smoker  . Smokeless tobacco: Never Used  Substance and Sexual Activity  . Alcohol use: No  . Drug use: No  . Sexual activity: Not Currently  Lifestyle  . Physical activity:    Days per week: Not on file    Minutes per session: Not on file  . Stress: Not on file  Relationships  . Social connections:    Talks on phone: Not on file    Gets together: Not on file    Attends religious service: Not on file    Active member of club or organization: Not on file    Attends meetings of clubs or organizations: Not on file    Relationship status: Not on file  . Intimate partner violence:    Fear of current or ex partner: Not on file    Emotionally abused: Not on file    Physically abused: Not on file    Forced sexual activity: Not on file  Other Topics Concern  . Not on file  Social History Narrative  . Not on file    Review of Systems: Unable to obtain.  Physical Exam: Vital signs in last 24 hours: Temp:  [94.8 F (34.9 C)-98.2 F (36.8 C)] 98.2 F (36.8 C) (01/20 1900) Pulse Rate:  [55-137] 119 (01/20 2100) Resp:  [25-34] 30 (01/20 2100) BP: (72-122)/(31-102) 74/44 (01/20 2100) SpO2:  [98 %-100 %] 99 % (01/20 2100) Weight:  [83.5 kg-87.5 kg] 87.5 kg (01/20 2040)  General:   Alert,.  Chronically ill-appearing obese lady.  She appeared to be in some respiratory distress. Head:  Normocephalic and  atraumatic. Eyes:  Sclera clear, no icterus.   Conjunctiva pink. Ears:  Normal auditory acuity. Nose:  No deformity, discharge,  or lesions. Mouth:  No deformity or lesions, dentition normal. Neck:  Supple; no masses or thyromegaly.  JVP difficult to assess due to thick neck and respiratory distress Lungs: Receiving BiPAP.  Rales rhonchi no wheezes heard throughout lung fields Heart: Tachycardic.  Unable to appreciate murmur Abdomen: Soft distended nontender tympanic to percussion hypoactive bowel sounds to auscultation Msk:  Symmetrical without gross deformities. Normal posture. Pulses:  No carotid, renal, femoral bruits. DP and PT symmetrical and equal Extremities:  Without clubbing or edema. Neurologic:  Alert and  oriented x4;  grossly normal neurologically. Skin:  Intact without significant lesions or rashes.   Intake/Output from previous day: No intake/output data recorded. Intake/Output this shift: Total I/O In: 636.2 [I.V.:593.7; IV Piggyback:42.5] Out: -   Lab Results: Recent Labs    02/19/2018 1507  WBC 27.5*  HGB 12.0  HCT 42.5  PLT 159   BMET Recent Labs    02/10/2018 1507 02/23/2018 1708  NA 137 139  K 6.9* 7.2*  CL 109 110  CO2 8* 8*  GLUCOSE 182* 165*  BUN 445* 104*  CREATININE 6.03* 5.95*  CALCIUM 8.5* 8.6*   LFT Recent Labs    02/25/2018 1507  PROT 7.3  ALBUMIN 3.8  AST 644*  ALT 295*  ALKPHOS 93  BILITOT 2.6*   PT/INR No results for input(s): LABPROT, INR in the last 72 hours. Hepatitis Panel No results for input(s): HEPBSAG, HCVAB, HEPAIGM, HEPBIGM in the last 72 hours.  Studies/Results: Dg Chest Port 1 View  Result Date: 02/20/2018 CLINICAL DATA:  Shortness of breath and cough today. EXAM: PORTABLE CHEST 1 VIEW COMPARISON:  PA and lateral chest 10/23/2017 and 06/01/2017. FINDINGS: There is marked cardiomegaly. Pulmonary edema is present. No pneumothorax or pleural effusion. Aortic atherosclerosis is noted. IMPRESSION: Cardiomegaly and  pulmonary edema. Atherosclerosis Electronically Signed   By: Inge Rise M.D.   On: 02/06/2018 17:06    Assessment/Plan:  Acute on chronic renal failure.  Baseline creatinine 2.7 10 October 2017.  GFR 15 cc/min.  Appears that she has been followed by nephrology although epic does not reveal any nephrology notes at this time and there is no family members to ask.  She does have diabetes.  She may have underlying diabetic nephropathy.  The does not appear to be any preparations for dialysis.  She does not appear to take any nonsteroidal anti-inflammatory drugs.  Lisinopril appears to be listed as a medication in October 2019 by her cardiologist.  Will check urine electrolytes urine urinalysis and renal ultrasound.  Will start CRRT in this lady in order to correct metabolic abnormalities.  IJ catheter placed temporary 02/14/2018 right sided  Metabolic acidosis.  Profound lactic acidosis.  Will correct with CRRT.  Hyperkalemia.  Should improve with correction of metabolic acidosis and hypothermia  Anemia does not appear to be a problem at this time  Lactic acidosis and shock.  Appears to be secondary to underlying congestive heart failure versus a septic syndrome.  She may have ischemic bowel.  This should be evaluated.  I doubt that this will be reversible and may be a terminal event.  Sepsis started on Zosyn.  Cultures pending.  Influenza status.\  Congestive heart failure would recommend recheck 2D echo  Respiratory failure sedated and intubated  History of gout history of preadmission allopurinol  Diabetes mellitus history of preadmission glimepiride.  Does not appear to be using metformin   LOS: 0 Sherril Croon @TODAY @9 :34 PM

## 2018-02-24 NOTE — ED Notes (Signed)
No urine output when foley inserted.  Pt has very prominent urethra and placement was verified by 3 nurses.  Will continue to monitor.

## 2018-02-24 NOTE — ED Notes (Signed)
Have paged respiratory  

## 2018-02-24 NOTE — ED Notes (Signed)
Pt having Kussmaul respirations.  Remains alert to verbal stimulus.  Noted bright red vaginal bleeding prior to foley catheter insertion, pt denies bleeding although old blood noted in adult diaper.

## 2018-02-24 NOTE — Procedures (Signed)
Intubation Procedure Note [name] [name] [id number] 25-Mar-1930  Procedure: Intubation Indications: Respiratory insufficiency Acute hypoxemic respiratory failure   Procedure Details Consent: Unable to obtain consent because of emergent medical necessity. Time Out: Verified patient identification, verified procedure, site/side was marked, verified correct patient position, special equipment/implants available, medications/allergies/relevent history reviewed, required imaging and test results available.  Performed  Maximum sterile technique was used including gloves and mask.  MAC and 3 ETT introduced from  First attempt wit hdirect visualization of vocal cords stabalized at 23cm at th elips     Evaluation Hemodynamic Status: BP stable throughout; O2 sats: stable throughout Patient's Current Condition: stable Complications: No apparent complications Patient did tolerate procedure well. Chest X-ray ordered to verify placement.  CXR: pending. ETT pulled out 1 cm    [name] [name] 03/07/2018

## 2018-02-24 NOTE — Progress Notes (Signed)
Order for RVP and droplet precautions.  Orders initiated.  Dtr at bedside educated regarding precautions.

## 2018-02-24 NOTE — ED Notes (Signed)
Date and time results received: 02/18/2018 3:33 PM  (use smartphrase ".now" to insert current time)  Test: VBG Critical Value: pH 7.003  Name of Provider Notified: Eulis Foster  Orders Received? Or Actions Taken?: Orders Received - See Orders for details

## 2018-02-24 NOTE — Progress Notes (Signed)
CRITICAL VALUE ALERT  Critical Value:  + MRSA PCR  Date & Time Notied:  02/23/2018 11:24 PM  Provider Notified:   Orders Received/Actions taken: per protocol

## 2018-02-24 NOTE — Progress Notes (Addendum)
CRITICAL VALUE ALERT  Critical Value:  Lactic acid-5.3  Date & Time Notied:  02/15/2018 9:55 PM   Provider Notified: Dr. Elwyn Reach  Orders Received/Actions taken:

## 2018-02-24 NOTE — ED Triage Notes (Signed)
Pt arrived to ED with shortness of breath and cough.  Was fairly nonspecific as far as her complaint.  EMS found CBG to be 50, gave tube of oral glucose with no improvement after 10 minutes.  Repeated oral glucose along with Glucagon.  CBG was 27 on arrival to ED, pt is oriented and arouses to verbal stimulus.

## 2018-02-24 NOTE — ED Notes (Signed)
Date and time results received: 02/16/2018 4:18 PM  (use smartphrase ".now" to insert current time)  Test: Trop Critical Value: 0.32  Name of Provider Notified: Eulis Foster  Orders Received? Or Actions Taken?: Orders Received - See Orders for details

## 2018-02-24 NOTE — ED Notes (Signed)
Have contacted [facility] for module

## 2018-02-24 NOTE — ED Notes (Signed)
Date and time results received: 03/07/2018 6:34 PM  (use smartphrase ".now" to insert current time)  Test: Lactic Critical Value: 7.6  Name of Provider Notified: Eulis Foster  Orders Received? Or Actions Taken?: Orders Received - See Orders for details

## 2018-02-24 NOTE — Progress Notes (Signed)
ANTICOAGULATION CONSULT NOTE - Initial Consult  Pharmacy Consult for heparin Indication: AFib  Patient Measurements: Height: 5\' 7"  (170.2 cm) Weight: 184 lb 1.4 oz (83.5 kg) IBW/kg (Calculated) : 61.6 Heparin Dosing Weight: 79 kg  Vital Signs: Temp: 98.2 F (36.8 C) (01/20 1900) BP: 110/71 (01/20 1900) Pulse Rate: 137 (01/20 1830)  Labs: Recent Labs    02/15/2018 1507 02/15/2018 1708  HGB 12.0  --   HCT 42.5  --   PLT 159  --   CREATININE 6.03* 5.95*  TROPONINI 0.32*  --     Assessment: 85 yo female with SOB, cough and weakness. She is on Eliquis prior to admission - last dose was today at 1300. Will begin heparin infusion tomorrow morning. Given Ms. Marchese age and renal dysfunction it is likely that the Eliquis will remain in her system a bit longer than the average patient.    Goal of Therapy:  Heparin level 0.3-0.7 units/ml Monitor platelets by anticoagulation protocol: Yes    Plan:  -Heparin 1100 units/hr -Daily, HL, aPTT -First level 8 hours after infusion initiation    Harvel Quale 02/28/2018,8:25 PM

## 2018-02-24 NOTE — ED Notes (Signed)
CRITICAL VALUE ALERT  Critical Value:  LACTIC 7.8   Date & Time Notied: 02/16/2018 1654   Provider Notified:Dr. Eulis Foster  Orders Received/Actions taken: None Yet

## 2018-02-24 NOTE — ED Notes (Signed)
Pt arrived to ED with no IV access.  Pt was oriented and talking.  Initial glucose check 27, but poor perfusion and very cold fingers.  ED tech repeated test in my presence while I was obtaining IV access to verify the reading from a different finger.  D50 given as second reading was being obtained.

## 2018-02-24 NOTE — ED Provider Notes (Addendum)
[facility] EMERGENCY DEPARTMENT Provider Note   CSN: [id number] Arrival date & time: 02/25/2018  1436     History   Chief Complaint Chief Complaint  Patient presents with  . Shortness of Breath    HPI MAURI TEMKIN is a 85 y.o. female.  HPI   She presents by EMS, called out for shortness of breath, with hypoglycemia.  Patient has remained alert and communicative during the encounter with EMS.  She complained of being thirsty, and was somewhat weak so they checked her blood sugar.  Initial ABG was low at 27.  She was given oral glucose, and IM glucagon.  She arrived to the ED still hypoglycemic.  She states she has been short of breath for a few days.  Patient was able to communicate and give history but she was a poor historian.  She denies chest pain, focal weakness or paresthesia.  She denies abdominal or back pain.  There are no other known modifying factors.  Past Medical History:  Diagnosis Date  . Acid reflux   . ARF (acute renal failure) ([place]) 09/16/2010   Prerenal. Creatinine 1.36 before discharge.  . Atrial fibrillation ([place])   . CHF (congestive heart failure) (HCC)    Hx of diastolic CHF.  Marland Kitchen Chronic anticoagulation   . CKD (chronic kidney disease) stage 3, GFR 30-59 ml/min (HCC)   . Diabetes mellitus without complication ([place])   . Diverticular hemorrhage 2011   Per colonoscopy  . Diverticulosis 2011   Per colonoscopy  . GI bleeding 08/03/2011   Not scoped but likely secondary to hemorrhoids or diverticulosis  . Gout   . Hx of medication noncompliance   . Hypercholesterolemia   . Hyperglycemia    Query DM 2  . Hypertension   . Internal hemorrhoids 2011   Per colonoscopy  . Seasonal allergies   . Thrombocytopenia due to drugs    Allopurinol    Patient Active Problem List   Diagnosis Date Noted  . Acute renal failure (ARF) ([place]) 02/23/2018  . CHF (congestive heart failure) ([place]) 10/23/2017  . Acute on chronic renal failure ([place]) 10/23/2017  .  Hyperkalemia 10/23/2017  . Acute hypoxemic respiratory failure ([place]) 08/14/2016  . Hypertensive emergency 08/14/2016  . High anion gap metabolic acidosis 37/16/9678  . Acute on chronic combined systolic and diastolic CHF (congestive heart failure) ([place]) 03/30/2016  . CKD (chronic kidney disease), stage III ([place]) 03/30/2016  . Hypertension 03/30/2016  . Febrile illness   . Back pain 04/10/2014  . Fever 04/10/2014  . GI bleeding 08/03/2011  . HTN (hypertension), malignant 08/03/2011  . Diverticulosis 08/03/2011  . Internal hemorrhoids 08/03/2011  . Hyperglycemia 08/03/2011  . Leukocytosis (leucocytosis) 09/25/2010  . Borderline abnormal TFTs 09/21/2010  . Hypocalcemia 09/18/2010  . Hypernatremia 09/18/2010  . Gout attack 09/18/2010  . Hypotension 09/17/2010  . Thrombocytopenia ([place]) 09/17/2010  . DM type 2 (diabetes mellitus, type 2) ([place]) 09/17/2010  . Hypomagnesemia 09/17/2010  . SBO (small bowel obstruction) ([place]) 09/16/2010  . Chronic kidney disease (CKD), stage IV (severe) ([place]) 09/16/2010  . Hypokalemia 09/16/2010  . Chronic a-fib ([place]) 09/16/2010  . Elevated troponin I level 09/16/2010    Past Surgical History:  Procedure Laterality Date  . ADENOIDECTOMY    . APPENDECTOMY    . BUNIONECTOMY    . CATARACT EXTRACTION    . CESAREAN SECTION    . ESOPHAGOGASTRODUODENOSCOPY  08/20/09   small hiatal hernia/mild gastritis  . ileocolonoscopy  08/20/09   normal terminal ileum/pancolonic diverticula/no  polyps/benign colon mucosa  . JOINT REPLACEMENT     Total right knee 2007  . LAPAROTOMY  09/22/2010   Procedure: EXPLORATORY LAPAROTOMY;  Surgeon: [name];  Location: [facility];  Service: General;  Laterality: N/A;  Lysis of Adhesions  . TONSILLECTOMY    . TUBAL LIGATION       OB History    Gravida  4   Para  4   Term  3   Preterm  1   AB      Living  3     SAB      TAB      Ectopic      Multiple      Live Births               Home Medications     Prior to Admission medications   Medication Sig Start Date End Date Taking? Authorizing Provider  acetaminophen (TYLENOL) 325 MG tablet Take 2 tablets (650 mg total) by mouth every 4 (four) hours as needed for headache or mild pain. 10/27/17  Yes Emokpae, Courage, MD  allopurinol (ZYLOPRIM) 100 MG tablet Take 100 mg by mouth daily.   Yes [provider]  amitriptyline (ELAVIL) 25 MG tablet Take 25 mg by mouth at bedtime as needed for sleep.  02/14/18  Yes [provider]  apixaban (ELIQUIS) 2.5 MG TABS tablet Take 1 tablet (2.5 mg total) by mouth 2 (two) times daily. 10/27/17  Yes Emokpae, Courage, MD  Artificial Tear Ointment (DRY EYES OP) Place 1 drop into both eyes as needed (dry eyes).   Yes [provider]  ferrous sulfate 325 (65 FE) MG tablet Take 325 mg by mouth 2 (two) times daily with a meal.    Yes [provider]  fluticasone (FLONASE) 50 MCG/ACT nasal spray Place 1 spray into both nostrils daily.   Yes [provider]  furosemide (LASIX) 40 MG tablet Take 1 tablet (40 mg total) by mouth daily. Patient taking differently: Take 20 mg by mouth daily.  10/27/17  Yes Emokpae, Courage, MD  glimepiride (AMARYL) 2 MG tablet Take 1 tablet (2 mg total) by mouth daily with breakfast. 10/28/17  Yes Emokpae, Courage, MD  levalbuterol (XOPENEX) 1.25 MG/3ML nebulizer solution Take 1.25 mg by nebulization every 4 (four) hours as needed for wheezing. 09/10/17  Yes Valentina Shaggy, MD  loratadine (CLARITIN) 10 MG tablet Take 10 mg by mouth daily.   Yes [provider]  metoprolol succinate (TOPROL-XL) 25 MG 24 hr tablet Take 25 mg by mouth every morning.   Yes [provider]  montelukast (SINGULAIR) 10 MG tablet Take 10 mg by mouth at bedtime.    Yes [provider]  Multiple Vitamin (MULTIVITAMIN WITH MINERALS) TABS tablet Take 1 tablet by mouth daily.   Yes [provider]  omeprazole (PRILOSEC) 20 MG capsule Take 1  capsule (20 mg total) by mouth daily. Patient taking differently: Take 20 mg by mouth every morning.  06/19/16  Yes Maczis, Barth Kirks, PA-C  potassium chloride SA (K-DUR,KLOR-CON) 20 MEQ tablet Take 1 tablet (20 mEq total) by mouth daily. 04/01/16  Yes Isaac Bliss, Rayford Halsted, MD  pravastatin (PRAVACHOL) 40 MG tablet Take 40 mg by mouth every evening.    Yes [provider]  sodium chloride (OCEAN) 0.65 % SOLN nasal spray Place 1 spray into both nostrils as needed for congestion.   Yes [provider]  Vitamin D, Ergocalciferol, (DRISDOL) 50000 units CAPS capsule  Take 50,000 Units by mouth every 30 (thirty) days. Administered on the 1st of each month   Yes [provider]    Family History Family History  Problem Relation Age of Onset  . Heart disease Mother   . Diabetes Mother     Social History Social History   Tobacco Use  . Smoking status: Never Smoker  . Smokeless tobacco: Never Used  Substance Use Topics  . Alcohol use: No  . Drug use: No     Allergies   Codeine; Sulfa antibiotics; and Sulfur   Review of Systems Review of Systems  All other systems reviewed and are negative.    Physical Exam Updated Vital Signs BP 110/71   Pulse (!) 137   Temp 98.2 F (36.8 C)   Resp (!) 31   Ht 5\' 7"  (1.702 m)   Wt 83.5 kg   SpO2 100%   BMI 28.83 kg/m   Physical Exam Vitals signs and nursing note reviewed.  Constitutional:      General: She is in acute distress.     Appearance: She is well-developed. She is obese. She is ill-appearing. She is not diaphoretic.  HENT:     Head: Normocephalic and atraumatic.     Right Ear: External ear normal.     Left Ear: External ear normal.     Nose: No congestion or rhinorrhea.     Mouth/Throat:     Mouth: Mucous membranes are dry.     Pharynx: No oropharyngeal exudate or posterior oropharyngeal erythema.  Eyes:     General:        Right eye: No discharge.        Left eye: No discharge.      Conjunctiva/sclera: Conjunctivae normal.     Pupils: Pupils are equal, round, and reactive to light.     Comments: Right eye appears icteric, the left does not.  Neck:     Musculoskeletal: Normal range of motion and neck supple.     Trachea: Phonation normal.  Cardiovascular:     Rate and Rhythm: Regular rhythm. Tachycardia present.     Heart sounds: Normal heart sounds.  Pulmonary:     Effort: Pulmonary effort is normal. No respiratory distress.     Breath sounds: Normal breath sounds. No stridor. No rhonchi.     Comments: Unusual breathing pattern, closes mouth on exhalation then air flutters out through lips. Abdominal:     Palpations: Abdomen is soft.     Tenderness: There is no abdominal tenderness.  Musculoskeletal: Normal range of motion.     Right lower leg: No edema.     Left lower leg: No edema.  Skin:    General: Skin is warm and dry.     Coloration: Skin is not jaundiced or pale.     Findings: No erythema.  Neurological:     Mental Status: She is alert and oriented to person, place, and time.     Cranial Nerves: No cranial nerve deficit.     Sensory: No sensory deficit.     Motor: No abnormal muscle tone.     Coordination: Coordination normal.  Psychiatric:        Mood and Affect: Mood normal.        Behavior: Behavior normal.      ED Treatments / Results  Labs (all labs ordered are listed, but only abnormal results are displayed) Labs Reviewed  BLOOD GAS, VENOUS - Abnormal; Notable for the following components:  Result Value   pH, Ven 7.003 (*)    pCO2, Ven 38.5 (*)    Bicarbonate 8.7 (*)    Acid-base deficit 19.8 (*)    All other components within normal limits  COMPREHENSIVE METABOLIC PANEL - Abnormal; Notable for the following components:   Potassium 6.9 (*)    CO2 8 (*)    Glucose, Bld 182 (*)    BUN 445 (*)    Creatinine, Ser 6.03 (*)    Calcium 8.5 (*)    AST 644 (*)    ALT 295 (*)    Total Bilirubin 2.6 (*)    GFR calc non Af Amer 6 (*)     GFR calc Af Amer 7 (*)    Anion gap 20 (*)    All other components within normal limits  CBC WITH DIFFERENTIAL/PLATELET - Abnormal; Notable for the following components:   WBC 27.5 (*)    MCV 103.2 (*)    MCHC 28.2 (*)    RDW 19.9 (*)    nRBC 3.5 (*)    Neutro Abs 23.3 (*)    Monocytes Absolute 2.1 (*)    Abs Immature Granulocytes 0.43 (*)    All other components within normal limits  BRAIN NATRIURETIC PEPTIDE - Abnormal; Notable for the following components:   B Natriuretic Peptide 2,594.0 (*)    All other components within normal limits  TROPONIN I - Abnormal; Notable for the following components:   Troponin I 0.32 (*)    All other components within normal limits  LACTIC ACID, PLASMA - Abnormal; Notable for the following components:   Lactic Acid, Venous 7.8 (*)    All other components within normal limits  LACTIC ACID, PLASMA - Abnormal; Notable for the following components:   Lactic Acid, Venous 7.6 (*)    All other components within normal limits  BASIC METABOLIC PANEL - Abnormal; Notable for the following components:   Potassium 7.2 (*)    CO2 8 (*)    Glucose, Bld 165 (*)    BUN 104 (*)    Creatinine, Ser 5.95 (*)    Calcium 8.6 (*)    GFR calc non Af Amer 6 (*)    GFR calc Af Amer 7 (*)    Anion gap >20 (*)    All other components within normal limits  CBG MONITORING, ED - Abnormal; Notable for the following components:   Glucose-Capillary 27 (*)    All other components within normal limits  CBG MONITORING, ED - Abnormal; Notable for the following components:   Glucose-Capillary 30 (*)    All other components within normal limits  CBG MONITORING, ED - Abnormal; Notable for the following components:   Glucose-Capillary 147 (*)    All other components within normal limits  CBG MONITORING, ED - Abnormal; Notable for the following components:   Glucose-Capillary 135 (*)    All other components within normal limits  CBG MONITORING, ED - Abnormal; Notable for the  following components:   Glucose-Capillary 290 (*)    All other components within normal limits  LIPASE, BLOOD  POTASSIUM  POTASSIUM  POTASSIUM  NA AND K (SODIUM & POTASSIUM), RAND UR  CBG MONITORING, ED    EKG EKG Interpretation  Date/Time:  Monday February 24 2018 14:51:19 EST Ventricular Rate:  116 PR Interval:    QRS Duration: 153 QT Interval:  371 QTC Calculation: 516 R Axis:   147 Text Interpretation:  Atrial fibrillation Nonspecific intraventricular conduction delay Consider anterolateral infarct Repol  abnrm suggests ischemia, inferior leads since last tracing no significant change Confirmed by Daleen Bo [phone]) on 02/23/2018 3:53:11 PM   EKG Interpretation  Date/Time:  Monday February 24 2018 18:20:26 EST Ventricular Rate:  114 PR Interval:    QRS Duration: 141 QT Interval:  339 QTC Calculation: 467 R Axis:   141 Text Interpretation:  Atrial flutter with predominant 2:1 AV block Nonspecific intraventricular conduction delay Consider anterolateral infarct Abnormal T, consider ischemia, inferior leads Since last tracing of earlier today QT has shortened Confirmed by Daleen Bo [phone]) on 02/08/2018 6:56:50 PM        Radiology Dg Chest Port 1 View  Result Date: 02/17/2018 CLINICAL DATA:  Shortness of breath and cough today. EXAM: PORTABLE CHEST 1 VIEW COMPARISON:  PA and lateral chest 10/23/2017 and 06/01/2017. FINDINGS: There is marked cardiomegaly. Pulmonary edema is present. No pneumothorax or pleural effusion. Aortic atherosclerosis is noted. IMPRESSION: Cardiomegaly and pulmonary edema. Atherosclerosis Electronically Signed   By: Inge Rise M.D.   On: 02/23/2018 17:06    Procedures .Critical Care Performed by: Daleen Bo, MD Authorized by: Daleen Bo, MD   Critical care provider statement:    Critical care time (minutes):  95   Critical care start time:  03/04/2018 2:51 PM   Critical care end time:  02/26/2018 5:21 PM   Critical care time  was exclusive of:  Separately billable procedures and treating other patients   Critical care was necessary to treat or prevent imminent or life-threatening deterioration of the following conditions:  Circulatory failure and metabolic crisis   Critical care was time spent personally by me on the following activities:  Blood draw for specimens, development of treatment plan with patient or surrogate, discussions with consultants, evaluation of patient's response to treatment, examination of patient, obtaining history from patient or surrogate, ordering and performing treatments and interventions, ordering and review of laboratory studies, pulse oximetry, re-evaluation of patient's condition, review of old charts and ordering and review of radiographic studies   (including critical care time)  Medications Ordered in ED Medications  dextrose 5 %-0.45 % sodium chloride infusion ( Intravenous New Bag/Given 03/03/2018 1537)  sodium bicarbonate 100 mEq in dextrose 5 % 1,000 mL infusion ( Intravenous New Bag/Given 02/11/2018 1742)  dextrose 50 % solution (has no administration in time range)  dextrose 50 % solution (  Given 02/12/2018 1450)  sodium chloride 0.9 % bolus 500 mL (0 mLs Intravenous Stopped 03/05/2018 1615)  albuterol (PROVENTIL) (2.5 MG/3ML) 0.083% nebulizer solution 10 mg (10 mg Nebulization Given 02/23/2018 1652)  insulin aspart (novoLOG) injection 5 Units (5 Units Intravenous Given 02/09/2018 1701)    And  dextrose 50 % solution 50 mL (50 mLs Intravenous Given 02/14/2018 1701)  sodium zirconium cyclosilicate (LOKELMA) packet 10 g (10 g Oral Given 02/20/2018 1713)  calcium gluconate 1 g/ 50 mL sodium chloride IVPB (1,000 mg Intravenous New Bag/Given 02/23/2018 1824)  dextrose 50 % solution 25 mL (25 mLs Intravenous Given 02/06/2018 1821)  insulin aspart (novoLOG) injection 5 Units (5 Units Subcutaneous Given 02/12/2018 1821)     Initial Impression / Assessment and Plan / ED Course  I have reviewed the triage vital  signs and the nursing notes.  Pertinent labs & imaging results that were available during my care of the patient were reviewed by me and considered in my medical decision making (see chart for details).  Clinical Course as of Feb 24 1926  Mon Feb 24, 2018  1501 Initial glucose low, any D50  ordered by me, sugar improved to 147.   [EW]  1626 Elevated  Troponin I - Once(!!) [EW]  1626 Normal except white count high, MCV high  CBC with Differential(!) [EW]  1626 Abnormal, potassium high, glucose high, BUN high, creatinine high, calcium low, AST high, ALT high, total bilirubin high, GFR low  Comprehensive metabolic panel(!!) [EW]  8841 Abnormal, pH low, PCO2 low, bicarb low  Blood gas, venous(!!) [EW]  1627 Abnormal, high  Brain natriuretic peptide(!) [EW]  1627 Abnormal, high  CBG monitoring, ED(!) [EW]  1627 Abnormal, high  Resp(!): 30 [EW]  1713 Case discussed with intensivist, Dr. Halford Chessman at [facility] in [place], [place].  He accepts patient for admission to the ICU.  He would prefer the patient goes there instead of the ED, for efficient management.   [EW]  6606 Consistent with mild pulmonary edema, image reviewed by me  DG Chest [facility] [EW]  1751 Repeat potassium 1708, higher, 7.2.  Calcium gluconate IV 1 g ordered.   [EW]  1757 We will also give a little bit more D50 and 5 units of insulin to help lower potassium.  Repeat EKG ordered   [EW]  1856 Repeat EKG is reassuring.   [EW]  1927 Advanced life support transport is here now to take the patient to [facility].  Patient remains alert, oxygen saturation 100% on 2 L nasal cannula.  She remains tachycardic.  She is no longer hypothermic.  Warming blanket has been removed.   [EW]    Clinical Course User Index [EW] Daleen Bo, MD     Patient Vitals for the past 24 hrs:  BP Temp Pulse Resp SpO2 Height Weight  02/11/2018 1900 110/71 98.2 F (36.8 C) - (!) 31 100 % - -  03/02/2018 1830 (!) 115/91 98.1 F  (36.7 C) (!) 137 (!) 28 100 % - -  02/19/2018 1807 95/78 98.1 F (36.7 C) (!) 128 (!) 29 100 % - -  02/26/2018 1805 93/71 98.1 F (36.7 C) - (!) 28 - - -  02/20/2018 1734 - 97.7 F (36.5 C) - - - - -  02/07/2018 1730 98/72 97.7 F (36.5 C) (!) 126 (!) 27 100 % - -  02/26/2018 1700 116/73 (!) 97.3 F (36.3 C) (!) 124 (!) 32 100 % - -  03/01/2018 1653 - - - - 100 % - -  02/18/2018 1639 122/73 (!) 97.2 F (36.2 C) (!) 126 (!) 34 100 % - -  03/01/2018 1630 122/73 (!) 97 F (36.1 C) (!) 117 (!) 28 - - -  02/28/2018 1552 (!) 115/102 (!) 97 F (36.1 C) (!) 129 (!) 30 100 % - -  03/07/2018 1530 (!) 115/102 (!) 94.8 F (34.9 C) - (!) 32 100 % - -  03/01/2018 1454 - - - - - 5\' 7"  (1.702 m) 83.5 kg    7:28 PM Reevaluation with update and discussion. After initial assessment and treatment, an updated evaluation reveals patient continues to [facility] well.  She is restless because of the warming blanket.  Findings discussed with patient's daughter and sister. Daleen Bo   Medical Decision Making: Acute renal failure likely primary process leading to secondary metabolic disarray including hyperkalemia, hypoglycemia, hypothermia and elevated troponin.  Doubt ACS.  Patient does not require urgent intubation.  She is mentating well and has a normal oxygen saturation with oxygen supplementation by nasal cannula.  Patient is critically ill and will require admission to ICU with urgent dialysis. Patient  transferred to [facility] for definitive management  CRITICAL CARE-yes Performed by: Daleen Bo  Nursing Notes Reviewed/ Care Coordinated Applicable Imaging Reviewed Interpretation of Laboratory Data incorporated into ED treatment  Plan: Admit/transfer  Final Clinical Impressions(s) / ED Diagnoses   Final diagnoses:  Acute renal failure, unspecified acute renal failure type ([place])  Hyperkalemia  Hypothermia, initial encounter  Hypoglycemia  Elevated troponin    ED Discharge Orders    None         Daleen Bo, MD 02/23/2018 Marya Amsler, MD 03/06/2018 1752    Daleen Bo, MD 02/17/2018 1932

## 2018-02-24 NOTE — Progress Notes (Signed)
+   MRSA PCR. Contact isolation ordered and initiated. Family not at bedside to educate.

## 2018-02-24 NOTE — Procedures (Signed)
Hemodialysis Catheter Insertion Procedure Note LEILYNN PILAT [id number] 10/02/1930  Procedure: Insertion of Hemodialysis Catheter Indications: CRRT  Procedure Details Consent: Unable to obtain consent because of emergent medical necessity. Time Out: Verified patient identification, verified procedure, site/side was marked, verified correct patient position, special equipment/implants available, medications/allergies/relevent history reviewed, required imaging and test results available.  Performed  Maximum sterile technique was used including antiseptics, cap, gloves, gown, hand hygiene, mask and sheet. Skin prep: Chlorhexidine; local anesthetic administered A antimicrobial bonded/coated triple lumen catheter was placed in the right internal jugular vein using the Seldinger technique.  Evaluation Blood flow good Complications: No apparent complications Patient did tolerate procedure well. Chest X-ray ordered to verify placement.  CXR: pending.  Procedure performed under direct ultrasound guidance for real time vessel cannulation.      Montey Hora, [place] [facility] Pgr: [phone]  or [phone] 02/26/2018, 9:34 PM

## 2018-02-25 ENCOUNTER — Inpatient Hospital Stay (HOSPITAL_COMMUNITY): Payer: Medicare Other

## 2018-02-25 ENCOUNTER — Other Ambulatory Visit (HOSPITAL_COMMUNITY): Payer: Medicare Other

## 2018-02-25 DIAGNOSIS — I340 Nonrheumatic mitral (valve) insufficiency: Secondary | ICD-10-CM

## 2018-02-25 LAB — RESPIRATORY PANEL BY PCR

## 2018-02-25 LAB — GLUCOSE, CAPILLARY
Glucose-Capillary: 101 mg/dL — ABNORMAL HIGH (ref 70–99)
Glucose-Capillary: 33 mg/dL — CL (ref 70–99)
Glucose-Capillary: 54 mg/dL — ABNORMAL LOW (ref 70–99)
Glucose-Capillary: 76 mg/dL (ref 70–99)
Glucose-Capillary: 54 mg/dL — ABNORMAL LOW (ref 70–99)
Glucose-Capillary: 105 mg/dL — ABNORMAL HIGH (ref 70–99)
Glucose-Capillary: 140 mg/dL — ABNORMAL HIGH (ref 70–99)
Glucose-Capillary: 214 mg/dL — ABNORMAL HIGH (ref 70–99)

## 2018-02-25 LAB — COMPREHENSIVE METABOLIC PANEL
ALT: 597 U/L — ABNORMAL HIGH (ref 0–44)
AST: 1162 U/L — ABNORMAL HIGH (ref 15–41)
Albumin: 3.5 g/dL (ref 3.5–5.0)
Alkaline Phosphatase: 94 U/L (ref 38–126)
Anion gap: 19 — ABNORMAL HIGH (ref 5–15)
BUN: 87 mg/dL — ABNORMAL HIGH (ref 8–23)
CO2: 17 mmol/L — ABNORMAL LOW (ref 22–32)
Calcium: 7.6 mg/dL — ABNORMAL LOW (ref 8.9–10.3)
Chloride: 102 mmol/L (ref 98–111)
Creatinine, Ser: 4.92 mg/dL — ABNORMAL HIGH (ref 0.44–1.00)
GFR calc Af Amer: 9 mL/min — ABNORMAL LOW (ref >60)
GFR calc non Af Amer: 7 mL/min — ABNORMAL LOW (ref >60)
Glucose, Bld: 71 mg/dL (ref 70–99)
Potassium: 4.2 mmol/L (ref 3.5–5.1)
Sodium: 138 mmol/L (ref 135–145)
Total Bilirubin: 2.6 mg/dL — ABNORMAL HIGH (ref 0.3–1.2)
Total Protein: 6.9 g/dL (ref 6.5–8.1)

## 2018-02-25 LAB — POCT I-STAT 3, ART BLOOD GAS (G3+)
Acid-base deficit: 4 mmol/L — ABNORMAL HIGH (ref 0.0–2.0)
Bicarbonate: 20.1 mmol/L (ref 20.0–28.0)
O2 Saturation: 99 %
Patient temperature: 36
TCO2: 21 mmol/L — ABNORMAL LOW (ref 22–32)
pCO2 arterial: 32.5 mmHg (ref 32.0–48.0)
pH, Arterial: 7.396 (ref 7.350–7.450)
pO2, Arterial: 127 mmHg — ABNORMAL HIGH (ref 83.0–108.0)

## 2018-02-25 LAB — RENAL FUNCTION PANEL
Albumin: 3.2 g/dL — ABNORMAL LOW (ref 3.5–5.0)
Anion gap: 21 — ABNORMAL HIGH (ref 5–15)
BUN: 65 mg/dL — ABNORMAL HIGH (ref 8–23)
CO2: 20 mmol/L — ABNORMAL LOW (ref 22–32)
Calcium: 6.8 mg/dL — ABNORMAL LOW (ref 8.9–10.3)
Chloride: 94 mmol/L — ABNORMAL LOW (ref 98–111)
Creatinine, Ser: 4.01 mg/dL — ABNORMAL HIGH (ref 0.44–1.00)
GFR calc Af Amer: 11 mL/min — ABNORMAL LOW (ref >60)
GFR calc non Af Amer: 10 mL/min — ABNORMAL LOW (ref >60)
Glucose, Bld: 141 mg/dL — ABNORMAL HIGH (ref 70–99)
Phosphorus: 4.4 mg/dL (ref 2.5–4.6)
Potassium: 4.2 mmol/L (ref 3.5–5.1)
Sodium: 135 mmol/L (ref 135–145)

## 2018-02-25 LAB — CBC
HCT: 40.5 % (ref 36.0–46.0)
Hemoglobin: 12.5 g/dL (ref 12.0–15.0)
MCH: 28.8 pg (ref 26.0–34.0)
MCHC: 30.9 g/dL (ref 30.0–36.0)
MCV: 93.3 fL (ref 80.0–100.0)
Platelets: 160 10*3/uL (ref 150–400)
RBC: 4.34 MIL/uL (ref 3.87–5.11)
RDW: 19.4 % — ABNORMAL HIGH (ref 11.5–15.5)
WBC: 25.4 10*3/uL — ABNORMAL HIGH (ref 4.0–10.5)
nRBC: 8.7 % — ABNORMAL HIGH (ref 0.0–0.2)

## 2018-02-25 LAB — ECHOCARDIOGRAM LIMITED
Height: 65 in
Weight: 3072.33 oz

## 2018-02-25 LAB — LACTIC ACID, PLASMA: Lactic Acid, Venous: 5.1 mmol/L (ref 0.5–1.9)

## 2018-02-25 LAB — APTT
aPTT: 192 seconds (ref 24–36)
aPTT: >200 seconds (ref 24–36)
aPTT: 124 seconds — ABNORMAL HIGH (ref 24–36)
aPTT: 61 seconds — ABNORMAL HIGH (ref 24–36)

## 2018-02-25 LAB — HEPARIN LEVEL (UNFRACTIONATED)
Heparin Unfractionated: >2.2 IU/mL — ABNORMAL HIGH (ref 0.30–0.70)
Heparin Unfractionated: >2.2 IU/mL — ABNORMAL HIGH (ref 0.30–0.70)

## 2018-02-25 LAB — PHOSPHORUS: Phosphorus: 5.4 mg/dL — ABNORMAL HIGH (ref 2.5–4.6)

## 2018-02-25 LAB — TROPONIN I
Troponin I: 0.7 ng/mL (ref <0.03)
Troponin I: 1.09 ng/mL (ref <0.03)

## 2018-02-25 LAB — BILIRUBIN, DIRECT: Bilirubin, Direct: 1.6 mg/dL — ABNORMAL HIGH (ref 0.0–0.2)

## 2018-02-25 LAB — MAGNESIUM: Magnesium: 2.2 mg/dL (ref 1.7–2.4)

## 2018-02-25 MED ORDER — DEXTROSE 50 % IV SOLN
INTRAVENOUS | Status: AC
  Administered 2018-02-25: 25 mL
  Filled 2018-02-25: qty 50

## 2018-02-25 MED ORDER — AMIODARONE HCL IN DEXTROSE 360-4.14 MG/200ML-% IV SOLN: 30.0000 mg/h | INTRAVENOUS | Status: DC

## 2018-02-25 MED ORDER — AMIODARONE HCL IN DEXTROSE 360-4.14 MG/200ML-% IV SOLN: 60.0000 mg/h | INTRAVENOUS | Status: DC

## 2018-02-25 MED ORDER — CHLORHEXIDINE GLUCONATE CLOTH 2 % EX PADS
6.0000 | MEDICATED_PAD | Freq: Every day | CUTANEOUS | Status: DC
  Administered 2018-03-04 – 2018-03-09 (×6): 6 via TOPICAL

## 2018-02-25 MED ORDER — PERFLUTREN LIPID MICROSPHERE
INTRAVENOUS | Status: AC
  Administered 2018-02-25: 10:00:00
  Filled 2018-02-25: qty 10

## 2018-02-25 MED ORDER — HEPARIN (PORCINE) 25000 UT/250ML-% IV SOLN: 750.0000 [IU]/h | INTRAVENOUS | Status: DC

## 2018-02-25 MED ORDER — SODIUM CHLORIDE 0.9% FLUSH
10.0000 mL | Freq: Two times a day (BID) | INTRAVENOUS | Status: DC
  Administered 2018-02-25 – 2018-03-08 (×18): 10 mL

## 2018-02-25 MED ORDER — AMIODARONE HCL IN DEXTROSE 360-4.14 MG/200ML-% IV SOLN
60.0000 mg/h | INTRAVENOUS | Status: DC
  Filled 2018-02-25: qty 200

## 2018-02-25 MED ORDER — AMIODARONE LOAD VIA INFUSION
150.0000 mg | Freq: Once | INTRAVENOUS | Status: AC
  Administered 2018-02-25: 150 mg via INTRAVENOUS
  Filled 2018-02-25: qty 83.34

## 2018-02-25 MED ORDER — DEXTROSE 50 % IV SOLN
25.0000 mL | Freq: Once | INTRAVENOUS | Status: AC
  Administered 2018-02-25: 25 mL via INTRAVENOUS

## 2018-02-25 MED ORDER — NOREPINEPHRINE 16 MG/250ML-% IV SOLN
0.0000 ug/min | INTRAVENOUS | Status: DC
  Administered 2018-02-25: 58 ug/min via INTRAVENOUS
  Administered 2018-02-25: 36 ug/min via INTRAVENOUS
  Administered 2018-02-26: 18 ug/min via INTRAVENOUS
  Filled 2018-02-25 (×4): qty 250

## 2018-02-25 MED ORDER — PRO-STAT SUGAR FREE PO LIQD
30.0000 mL | Freq: Two times a day (BID) | ORAL | Status: DC
  Administered 2018-02-25 – 2018-03-03 (×13): 30 mL
  Filled 2018-02-25 (×12): qty 30

## 2018-02-25 MED ORDER — SODIUM CHLORIDE 0.9% FLUSH
10.0000 mL | INTRAVENOUS | Status: DC | PRN
  Administered 2018-03-07: 20 mL
  Filled 2018-02-25: qty 40

## 2018-02-25 MED ORDER — PHENYLEPHRINE HCL-NACL 10-0.9 MG/250ML-% IV SOLN
0.0000 ug/min | INTRAVENOUS | Status: DC
  Administered 2018-02-25: 50 ug/min via INTRAVENOUS
  Filled 2018-02-25: qty 250

## 2018-02-25 MED ORDER — VITAL HIGH PROTEIN PO LIQD
1000.0000 mL | ORAL | Status: DC
  Administered 2018-02-25 – 2018-02-27 (×3): 1000 mL

## 2018-02-25 MED ORDER — DEXTROSE 50 % IV SOLN: 25.0000 mL | Freq: Once | INTRAVENOUS | Status: DC

## 2018-02-25 MED ORDER — NOREPINEPHRINE-SODIUM CHLORIDE 4-0.9 MG/250ML-% IV SOLN
0.0000 ug/min | INTRAVENOUS | Status: DC
  Administered 2018-02-25: 10 ug/min via INTRAVENOUS
  Filled 2018-02-25 (×2): qty 250

## 2018-02-25 MED ORDER — VASOPRESSIN 20 UNIT/ML IV SOLN
0.0100 [IU]/min | INTRAVENOUS | Status: DC
  Administered 2018-02-25 – 2018-02-27 (×3): 0.04 [IU]/min via INTRAVENOUS
  Filled 2018-02-25 (×3): qty 2

## 2018-02-25 MED ORDER — HEPARIN (PORCINE) 25000 UT/250ML-% IV SOLN
800.0000 [IU]/h | INTRAVENOUS | Status: DC
  Administered 2018-02-26: 750 [IU]/h via INTRAVENOUS
  Administered 2018-02-28: 850 [IU]/h via INTRAVENOUS
  Administered 2018-03-01 – 2018-03-03 (×4): 800 [IU]/h via INTRAVENOUS
  Filled 2018-02-25 (×5): qty 250

## 2018-02-25 MED ORDER — VITAL HIGH PROTEIN PO LIQD: 1000.0000 mL | ORAL | Status: DC

## 2018-02-25 MED ORDER — SODIUM CHLORIDE 0.9 % IV SOLN
INTRAVENOUS | Status: DC
  Administered 2018-02-25 – 2018-02-28 (×2): via INTRAVENOUS
  Administered 2018-03-08: 500 mL via INTRAVENOUS

## 2018-02-25 MED ORDER — DEXTROSE 10 % IV SOLN
INTRAVENOUS | Status: DC
  Administered 2018-02-25: 10:00:00 via INTRAVENOUS

## 2018-02-25 MED ORDER — HEPARIN (PORCINE) 25000 UT/250ML-% IV SOLN: 850.0000 [IU]/h | INTRAVENOUS | Status: DC

## 2018-02-25 NOTE — Progress Notes (Signed)
ANTICOAGULATION CONSULT NOTE - Follow Up Consult  Pharmacy Consult for IV Heparin Indication: atrial fibrillation  Allergies  Allergen Reactions  . Codeine Nausea And Vomiting  . Sulfa Antibiotics Nausea And Vomiting  . Sulfur Nausea And Vomiting    Patient Measurements: Height: 5\' 5"  (165.1 cm) Weight: 192 lb 0.3 oz (87.1 kg) IBW/kg (Calculated) : 57 Heparin Dosing Weight: 79 kg  Vital Signs: Temp: 98.2 F (36.8 C) (01/21 2045) Temp Source: Bladder (01/21 2000) BP: 141/129 (01/21 2000) Pulse Rate: 104 (01/21 2045)  Labs: Recent Labs    02/28/2018 1507  03/05/2018 2113 02/25/18 0300 02/25/18 0515 02/25/18 0820 02/25/18 1502 02/25/18 1634 02/25/18 1758 02/25/18 2015  HGB 12.0  --  11.8*  --  12.5  --   --   --   --   --   HCT 42.5  --  39.8  --  40.5  --   --   --   --   --   PLT 159  --  147*  --  160  --   --   --   --   --   APTT  --    < > 42*  --   --   --  192* >200* 124* 61*  HEPARINUNFRC  --   --   --   --   --   --  >2.20* >2.20*  --   --   CREATININE 6.03*   < > 6.14*  --  4.92*  --  4.01*  --   --   --   TROPONINI 0.32*  --  0.52* 0.70*  --  1.09*  --   --   --   --    < > = values in this interval not displayed.    Estimated Creatinine Clearance: 11.2 mL/min (A) (by C-G formula based on SCr of 4.01 mg/dL (H)).   Medications:  Infusions:  . dextrose 50 mL/hr at 02/25/18 2000  . fentaNYL infusion INTRAVENOUS 75 mcg/hr (02/25/18 2000)  . norepinephrine (LEVOPHED) Adult infusion 40 mcg/min (02/25/18 2000)  . piperacillin-tazobactam (ZOSYN)  IV Stopped (02/25/18 1825)  . prismasol BGK 0/2.5 1,000 mL/hr at 02/25/18 1415  .  sodium bicarbonate (isotonic) infusion in sterile water 50 mL/hr at 02/25/18 2102  . sodium bicarbonate (isotonic) 1000 mL infusion 100 mL/hr at 02/25/18 1539  . sodium bicarbonate (isotonic) 1000 mL infusion 100 mL/hr at 02/25/18 0646  . sodium chloride 999 mL/hr at 02/26/2018 2306  . vasopressin (PITRESSIN) infusion - *FOR SHOCK* 0.04  Units/min (02/25/18 2000)    Assessment: 85 year old female transitioned from oral Eliquis to IV Heparin.   After holding heparin 2.5 hours for SUPRA-therapeutic aPTTs, aPTT is now down to 61. Night shift RN noted appearance of blood in stool when cleaning up patient.  RN contacted MD to determine plan - [place] with resuming IV Heparin now that aPTT down.   Goal of Therapy:  Heparin level 0.3-0.7 units/ml aPTT 66-102 seconds Monitor platelets by anticoagulation protocol: Yes   Plan:  Resume Heparin at much lower rate of 750 units/hr (>4 units/kg/hr) and recheck aPTT in 6 to 8 hours.  Monitor CBC and for any signs of bleeding Monitor daily aPTT and HL until correlating.   Sloan Leiter, PharmD, BCPS, BCCCP Clinical Pharmacist Please refer to [facility] for [place] numbers 02/25/2018,9:04 PM

## 2018-02-25 NOTE — Progress Notes (Signed)
eLink Physician-Brief Progress Note Patient Name: SAIDAH KEMPTON DOB: 11-Jun-1930 MRN: [id number]   Date of Service  02/25/2018  HPI/Events of Note  Lactic Acid = 7.8 --> 7.6 --> 5.3 --> 5.1. Lactic Acid continues to clear.   eICU Interventions  Continue present management.      Intervention Category Major Interventions: Acid-Base disturbance - evaluation and management  Sommer,Steven Eugene 02/25/2018, 1:29 AM

## 2018-02-25 NOTE — Progress Notes (Signed)
CRITICAL VALUE ALERT  Critical Value:  PTT >200  Date & Time Notied:  02/25/18 1726  Provider Notified: Dr. Sharon Seller, CCM   Orders Received/Actions taken: holding heparin, will redraw at 1800 per pharmacy

## 2018-02-25 NOTE — Progress Notes (Signed)
Hypoglycemic Event  CBG: 54  Treatment: D50 25 mL (12.5 gm)  Symptoms: None  Follow-up CBG: Time: 1141 CBG Result: 105  Possible Reasons for Event: Unknown  Comments/MD notified: TF starting    Shayle Donahoo M D'Adamo

## 2018-02-25 NOTE — Progress Notes (Signed)
NAME:  [name] [name], MRN:  [id number], DOB:  02-Jan-1931, LOS: 1 ADMISSION DATE:  02/11/2018, CONSULTATION DATE:  02/12/2018 REFERRING MD:  Martin Majestic - [facility]  CHIEF COMPLAINT:  SOB   Brief History   This is an 85 year old female who presented to [facility] with SOB, cough, weakness and found to have multiple metabolic derangements including AoCKD with Scr 6, BUN 445, K 7.2. Received temporizing measures and transferred to [facility] for further management.   History of present illness   [name] [name] is a 85 y.o. female who has a PMH as outlined below (see "past medical history") including HTN, HLD, CHF, a fib, and CKD.  She presented to [facility] ED 1/20 with SOB, cough, weakness.  On EMS arrival, she was found to be hypoglycemic to around 50.  She received oral glucose and IM glucagon.  She was taken to ED where she remained with generalized weakness.  She was found to have multiple metabolic derangements including K 7.2, SCr 6.03, BUN 445 (165 on repeat), AG 20, BNP 2594, Trop 0.32, lactate 7.8, WBC 27.5.  ABG demonstrated metabolic + respiratory acidosis (7.0 / 38 / 42).  She was transferred to [facility] for further management with possibility of emergent HD.  After arrival to [facility], she was hypotensive with SBP in 70s.  Additionally, she had kussmaul respirations and although she was responsive, she was slow to respond.  She was started on BiPAP but later required intubation for continued respiratory insufficiency.  Past Medical History  HTN, HLD, CHF, A.fib, CKD, DM, diverticulosis, GERD.  Significant Hospital Events   1/20 >Admitted to ICU  Consults:  Nephrology  Procedures:  ETT 1/20 >  R IJ HD cath 1/20 >   Significant Diagnostic Tests:  CXR 1/20 > CM with pulmonary edema. Echo 1/20 >  Impressions:  - Compared to a prior study in 2019, the LVEF is further reduced to   20-25% with more severe RV hypokinesis, inferior and lateral LV   hypokinesis to akinesis and elevated biventricular filling    pressure.    Micro Data:  Blood 1/20 >  Sputum 1/20 >  Urine 1/20 >   Antimicrobials:  Zosyn 02/24/17  Interim history/subjective:  Patient was admitted yesterday. Currently intubated and sedated. Has been having hypotension requiring increased doses of phenylephrine, now up to the max. She also had episodes of hypoglycemia last night requiring dextrose pushes.   Objective   Blood pressure (!) 98/51, pulse (!) 58, temperature (!) 96.8 F (36 C), temperature source Bladder, resp. rate (!) 22, height 5\' 5"  (1.651 m), weight 87.1 kg, SpO2 (!) 83 %.    Vent Mode: PRVC FiO2 (%):  [40 %-100 %] 40 % Set Rate:  [16 bmp-18 bmp] 18 bmp Vt Set:  [450 mL] 450 mL PEEP:  [5 cmH20] 5 cmH20 Plateau Pressure:  [19 cmH20-22 cmH20] 19 cmH20   Intake/Output Summary (Last 24 hours) at 02/25/2018 0719 Last data filed at 02/25/2018 0700 Gross per 24 hour  Intake 2642.45 ml  Output 1565 ml  Net 1077.45 ml   Filed Weights   02/16/2018 1454 02/27/2018 2040 02/25/18 0415  Weight: 83.5 kg 87.5 kg 87.1 kg    Examination: General: Intubated, sedated, critically ill appears HENT: Central line in right IJ, dry mucus membrane, atraumatic, normocephalic Lungs: Bibasilar crackles, increased work of breathing Cardiovascular: Tachycardia, could not appreciate any m/r/g Abdomen: Soft, non-tender, some distension on upper abdomen Extremities: Trace BL LE edema, cold extremities Neuro: Sedated, not  following commands GU: Foley in place, no urine output  Resolved Hospital Problem list    Assessment & Plan:  Hypoxic respiratory distress 2/2 pulmonary edema Cardiogenic shock with a possible component of septic shock Leukocytosis: Metabolic acidosis: -Requiring neosyneprhine PRN, goal MAP >65. Patient was requiring increased doses of neosynephrine and was up to the max dose and still having a MAP of <65.  -Lactic acid has improved slightly, most recent one was 5.1, down from 7.8 on admission. Patient appears  to be in severe cardiogenic shock requiring multiple vasopressors to keep the MAP >65. No obvious source of infection found, cultures are still pending. This is likely just worsening of her heart failure and she appears to have a very poor prognosis.  -Added vasopressin drip to help with the hypotension and decreased cardiac output -Continue fentanyl and versed for sedation -Continue sodium bicarbonate maintenance fluids -Echocardiogram showed EF 20-25%, with severe RV hypokinesis, interior and lateral LV hypokinesis and elevated biventricular filling pressures -F/u urine, blood and tracheal aspirate cultures -WBC remained elevated to 25 however this may be related to the current cardiogenic shock -Continue zosyn  AoCKD: Hyperkalemia: -Nephrology following and managing CRRT. They suspect that her CKD has progressed to ESRD, etiology is thought to be ATN.  -Anuric today -Baseline Cr of around 2, with significantly hyperkalemic up to 7.2 on admission.  -Continue CRRT -Today labs showed improvement in her electrolytes.   Transaminitis: Likely 2/2 shock liver. Today AST 1,162 and ALT 597. Continue to monitor.    Elevated troponin: -Troponin has trended upwards, 0.52 > 0.7 > 1.09 -Likely 2/2 demand ischemia  Diabetes mellitus: -SSI -Frequent CBGS  HTN, HLD, HFrEF Atrial fibrillation with RVR -Currently on heparin -Holding apixiban, lasix, toprol, and pravastatin  -Tried starting her on amiodarone drip for her atrial fibrillation however she became severely hypotensive and had to be placed in trandelenburg position and we had to increase her pressor support.  -Currently HR is in the 120-150s, this is likely required at this time to help maintain her blood pressure  Gout: -On allopurinol at home, currently holding this  Best practice:  Diet: Trickle feeds Pain/Anxiety/Delirium protocol (if indicated): Fentanyl and midazolam, RASS goal 0 to -1. VAP protocol (if indicated): Yes DVT  prophylaxis: SCDs, heparin GI prophylaxis: PPI Glucose control: SSI Mobility: Bedrest Code Status: Full Family Communication: Spoke with daughter  Disposition:   Labs   CBC: Recent Labs  Lab 03/07/2018 1507 02/23/2018 2113 02/25/18 0515  WBC 27.5* 25.2* 25.4*  NEUTROABS 23.3*  --   --   HGB 12.0 11.8* 12.5  HCT 42.5 39.8 40.5  MCV 103.2* 98.0 93.3  PLT 159 147* 423    Basic Metabolic Panel: Recent Labs  Lab 02/20/2018 1507 02/21/2018 1708 02/21/2018 2113 02/25/18 0515  NA 137 139 138 138  K 6.9* 7.2* 6.0* 4.2  CL 109 110 110 102  CO2 8* 8* 10* 17*  GLUCOSE 182* 165* 273* 71  BUN 445* 104* 111* 87*  CREATININE 6.03* 5.95* 6.14* 4.92*  CALCIUM 8.5* 8.6* 7.9* 7.6*  MG  --   --   --  2.2  PHOS  --   --   --  5.4*   GFR: Estimated Creatinine Clearance: 9.1 mL/min (A) (by C-G formula based on SCr of 4.92 mg/dL (H)). Recent Labs  Lab 02/28/2018 1507 02/23/2018 1755 03/03/2018 2113 02/25/18 0034 02/25/18 0515  WBC 27.5*  --  25.2*  --  25.4*  LATICACIDVEN 7.8* 7.6* 5.3* 5.1*  --  Liver Function Tests: Recent Labs  Lab 02/17/2018 1507 02/13/2018 2113 02/25/18 0515  AST 644* 972* 1,162*  ALT 295* 450* 597*  ALKPHOS 93 87 94  BILITOT 2.6* 2.5* 2.6*  PROT 7.3 6.6 6.9  ALBUMIN 3.8 3.4* 3.5   Recent Labs  Lab 02/22/2018 1507  LIPASE 26   No results for input(s): AMMONIA in the last 168 hours.  ABG    Component Value Date/Time   PHART 7.396 02/25/2018 0644   PCO2ART 32.5 02/25/2018 0644   PO2ART 127.0 (H) 02/25/2018 0644   HCO3 20.1 02/25/2018 0644   TCO2 21 (L) 02/25/2018 0644   ACIDBASEDEF 4.0 (H) 02/25/2018 0644   O2SAT 99.0 02/25/2018 0644     Coagulation Profile: No results for input(s): INR, PROTIME in the last 168 hours.  Cardiac Enzymes: Recent Labs  Lab 02/26/2018 1507 02/07/2018 2113 02/25/18 0300  TROPONINI 0.32* 0.52* 0.70*    HbA1C: Hgb A1c MFr Bld  Date/Time Value Ref Range Status  10/23/2017 03:06 PM 7.9 (H) 4.8 - 5.6 % Final    Comment:      (NOTE) Pre diabetes:          5.7%-6.4% Diabetes:              >6.4% Glycemic control for   <7.0% adults with diabetes   09/17/2010 05:27 AM 6.3 (H) <5.7 % Final    Comment:    (NOTE)                                                                       According to the ADA Clinical Practice Recommendations for 2011, when HbA1c is used as a screening test:  >=6.5%   Diagnostic of Diabetes Mellitus           (if abnormal result is confirmed) 5.7-6.4%   Increased risk of developing Diabetes Mellitus References:Diagnosis and Classification of Diabetes Mellitus,Diabetes WCBJ,6283,15(VVOHY 1):S62-S69 and Standards of Medical Care in         Diabetes - 2011,Diabetes Care,2011,34 (Suppl 1):S11-S61.    CBG: Recent Labs  Lab 02/21/2018 1634 02/22/2018 1827 02/14/2018 2026 02/25/2018 2308 02/25/18 0304  GLUCAP 135* 290* 241* 230* 101*    Review of Systems:   Unable to obtain due to patients mental status  Past Medical History  She,  has a past medical history of Acid reflux, ARF (acute renal failure) ([place]) (09/16/2010), Atrial fibrillation ([place]), CHF (congestive heart failure) ([place]), Chronic anticoagulation, CKD (chronic kidney disease) stage 3, GFR 30-59 ml/min ([place]), Diabetes mellitus without complication ([place]), Diverticular hemorrhage (2011), Diverticulosis (2011), GI bleeding (08/03/2011), Gout, medication noncompliance, Hypercholesterolemia, Hyperglycemia, Hypertension, Internal hemorrhoids (2011), Seasonal allergies, and Thrombocytopenia due to drugs.   Surgical History    Past Surgical History:  Procedure Laterality Date  . ADENOIDECTOMY    . APPENDECTOMY    . BUNIONECTOMY    . CATARACT EXTRACTION    . CESAREAN SECTION    . ESOPHAGOGASTRODUODENOSCOPY  08/20/09   small hiatal hernia/mild gastritis  . ileocolonoscopy  08/20/09   normal terminal ileum/pancolonic diverticula/no polyps/benign colon mucosa  . JOINT REPLACEMENT     Total right knee 2007  . LAPAROTOMY  09/22/2010    Procedure: EXPLORATORY LAPAROTOMY;  Surgeon: [name];  Location: [facility];  Service: General;  Laterality: N/A;  Lysis of Adhesions  . TONSILLECTOMY    . TUBAL LIGATION       Social History   reports that she has never smoked. She has never used smokeless tobacco. She reports that she does not drink alcohol or use drugs.   Family History   Her family history includes Diabetes in her mother; Heart disease in her mother.   Allergies Allergies  Allergen Reactions  . Codeine Nausea And Vomiting  . Sulfa Antibiotics Nausea And Vomiting  . Sulfur Nausea And Vomiting     Home Medications  Prior to Admission medications   Medication Sig Start Date End Date Taking? Authorizing Provider  acetaminophen (TYLENOL) 325 MG tablet Take 2 tablets (650 mg total) by mouth every 4 (four) hours as needed for headache or mild pain. 10/27/17  Yes Emokpae, Courage, MD  allopurinol (ZYLOPRIM) 100 MG tablet Take 100 mg by mouth daily.   Yes [provider]  amitriptyline (ELAVIL) 25 MG tablet Take 25 mg by mouth at bedtime as needed for sleep.  02/14/18  Yes [provider]  apixaban (ELIQUIS) 2.5 MG TABS tablet Take 1 tablet (2.5 mg total) by mouth 2 (two) times daily. 10/27/17  Yes Emokpae, Courage, MD  Artificial Tear Ointment (DRY EYES OP) Place 1 drop into both eyes as needed (dry eyes).   Yes [provider]  ferrous sulfate 325 (65 FE) MG tablet Take 325 mg by mouth 2 (two) times daily with a meal.    Yes [provider]  fluticasone (FLONASE) 50 MCG/ACT nasal spray Place 1 spray into both nostrils daily.   Yes [provider]  furosemide (LASIX) 40 MG tablet Take 1 tablet (40 mg total) by mouth daily. Patient taking differently: Take 20 mg by mouth daily.  10/27/17  Yes Emokpae, Courage, MD  glimepiride (AMARYL) 2 MG tablet Take 1 tablet (2 mg total) by mouth daily with breakfast. 10/28/17  Yes Emokpae, Courage, MD  levalbuterol (XOPENEX) 1.25 MG/3ML  nebulizer solution Take 1.25 mg by nebulization every 4 (four) hours as needed for wheezing. 09/10/17  Yes Valentina Shaggy, MD  loratadine (CLARITIN) 10 MG tablet Take 10 mg by mouth daily.   Yes [provider]  metoprolol succinate (TOPROL-XL) 25 MG 24 hr tablet Take 25 mg by mouth every morning.   Yes [provider]  montelukast (SINGULAIR) 10 MG tablet Take 10 mg by mouth at bedtime.    Yes [provider]  Multiple Vitamin (MULTIVITAMIN WITH MINERALS) TABS tablet Take 1 tablet by mouth daily.   Yes [provider]  omeprazole (PRILOSEC) 20 MG capsule Take 1 capsule (20 mg total) by mouth daily. Patient taking differently: Take 20 mg by mouth every morning.  06/19/16  Yes Maczis, Barth Kirks, PA-C  potassium chloride SA (K-DUR,KLOR-CON) 20 MEQ tablet Take 1 tablet (20 mEq total) by mouth daily. 04/01/16  Yes Isaac Bliss, Rayford Halsted, MD  pravastatin (PRAVACHOL) 40 MG tablet Take 40 mg by mouth every evening.    Yes [provider]  sodium chloride (OCEAN) 0.65 % SOLN nasal spray Place 1 spray into both nostrils as needed for congestion.   Yes [provider]  Vitamin D, Ergocalciferol, (DRISDOL) 50000 units CAPS capsule Take 50,000 Units by mouth every 30 (thirty) days. Administered on the 1st of each month   Yes [provider]         Asencion Noble, M.D. PGY1 Pager [phone] 02/25/2018 2:39 PM

## 2018-02-25 NOTE — Procedures (Signed)
Arterial Catheter Insertion Procedure Note [name] [name] [id number] Dec 22, 1930  Procedure: Insertion of Arterial Catheter  Indications: Blood pressure monitoring  Procedure Details Consent: Unable to obtain consent because of altered level of consciousness. Time Out: Verified patient identification, verified procedure, site/side was marked, verified correct patient position, special equipment/implants available, medications/allergies/relevent history reviewed, required imaging and test results available.  Performed  Maximum sterile technique was used including antiseptics, cap, gloves, gown, hand hygiene, mask and sheet. Skin prep: Chlorhexidine; local anesthetic administered 20 gauge catheter was inserted into left radial artery using the Seldinger technique. ULTRASOUND GUIDANCE USED: NO Evaluation Blood flow good; BP tracing good. Complications: No apparent complications.  Normal saline flush used.  Patient doppler positive for collateral circulation prior to procedure.  Site dressed per RT protocol.  Line leveled and zeroed.  Patient tolerated well, RN at bedside.   Lacretia Nicks 02/25/2018

## 2018-02-25 NOTE — Progress Notes (Addendum)
Initial Nutrition Assessment  DOCUMENTATION CODES:   Not applicable  INTERVENTION:   Initiate TF via OGT:  Vital High Protein at 50 ml/h (1200 ml per day)  Pro-stat 30 ml BID  Provides 1400 kcal, 135 gm protein, 1003 ml free water daily  NUTRITION DIAGNOSIS:   Inadequate oral intake related to inability to eat as evidenced by NPO status.  GOAL:   Patient will meet greater than or equal to 90% of their needs  MONITOR:   Vent status, TF tolerance, Labs, Skin, I & O's  REASON FOR ASSESSMENT:   Ventilator, Consult Enteral/tube feeding initiation and management  ASSESSMENT:   85 yo female with PMH of A fib, HTN, HLD, CHF, diverticulosis, CKD stage IV, and DM who was admitted with severe hyperkalemia and acute renal failure with bilateral pulmonary edema. Required intubation on the evening of 1/20.  Received MD Consult for TF initiation and management. OGT in place.   Patient is currently intubated on ventilator support MV: 7.4 L/min Temp (24hrs), Avg:97.2 F (36.2 C), Min:94.8 F (34.9 C), Max:98.2 F (36.8 C)   Labs reviewed. BUN 87 (H), creatinine 4.92 (H), phosphorus 5.4 (H), potassium 4.2 WNL CBG's: 54-33-76 Medications reviewed and include levophed, novolog, D10 IV, sodium bicarb IV.  I/O +1 L  Suspect current weight is above dry weight given volume overload with renal failure. On CRRT, remains volume overloaded. Unable to remove volume due to low BP.   NUTRITION - FOCUSED PHYSICAL EXAM:    Most Recent Value  Orbital Region  No depletion  Upper Arm Region  No depletion  Thoracic and Lumbar Region  No depletion  Buccal Region  No depletion  Temple Region  No depletion  Clavicle Bone Region  No depletion  Clavicle and Acromion Bone Region  No depletion  Scapular Bone Region  Unable to assess  Dorsal Hand  No depletion  Patellar Region  No depletion  Anterior Thigh Region  No depletion  Posterior Calf Region  No depletion  Edema (RD Assessment)  Mild   Hair  Reviewed  Eyes  Unable to assess  Mouth  Unable to assess  Skin  Reviewed  Nails  Reviewed       Diet Order:   Diet Order            Diet NPO time specified  Diet effective now              EDUCATION NEEDS:   No education needs have been identified at this time  Skin:  Skin Assessment: Reviewed RN Assessment(MASD to breast)  Last BM:  PTA  Height:   Ht Readings from Last 1 Encounters:  02/25/2018 5\' 5"  (1.651 m)    Weight:   Wt Readings from Last 1 Encounters:  02/25/18 87.1 kg    Ideal Body Weight:  56.8 kg  BMI:  Body mass index is 31.95 kg/m.   Suspect BMI not above 30 with actual weight, current weight elevated with volume overload.   Estimated Nutritional Needs:   Kcal:  1400  Protein:  130-150 gm  Fluid:  1.4 L    Molli Barrows, RD, LDN, [place] Pager [phone] After Hours Pager [phone]

## 2018-02-25 NOTE — Progress Notes (Signed)
Pharmacy Antibiotic Note  [name] [name] is a 85 y.o. female admitted on 02/15/2018 with sepsis, unknown source.  Pharmacy has been consulted for Zosyn dosing.   Of note, patient initiated on CRRT.   Plan: Continue Zosyn 2.25 g IV q6h per CRRT dosing  Monitor WBC trend, temp curve, c/s, and clinical progress  F/U renal plans    Height: 5\' 5"  (165.1 cm) Weight: 192 lb 0.3 oz (87.1 kg) IBW/kg (Calculated) : 57  Temp (24hrs), Avg:97.2 F (36.2 C), Min:94.8 F (34.9 C), Max:98.2 F (36.8 C)  Recent Labs  Lab 02/12/2018 1507 02/13/2018 1708 02/20/2018 1755 02/28/2018 2113 02/25/18 0034 02/25/18 0515  WBC 27.5*  --   --  25.2*  --  25.4*  CREATININE 6.03* 5.95*  --  6.14*  --  4.92*  LATICACIDVEN 7.8*  --  7.6* 5.3* 5.1*  --     Estimated Creatinine Clearance: 9.1 mL/min (A) (by C-G formula based on SCr of 4.92 mg/dL (H)).    Allergies  Allergen Reactions  . Codeine Nausea And Vomiting  . Sulfa Antibiotics Nausea And Vomiting  . Sulfur Nausea And Vomiting    Antimicrobials this admission: Zosyn 1/20 >>   Microbiology results: 1/21 BCx:  1/21 UCx:  1/21 TA: few GPC 1/21 Resp panel: negative 1/21 MRSA PCR: POSITIVE  Thank you for allowing pharmacy to be a part of this patient's care.  Carlyon Shadow 02/25/2018 3:02 PM

## 2018-02-25 NOTE — Progress Notes (Signed)
Patient ID: [name] [name], female   DOB: [birth year], 85 y.o.   MRN: [id number]  [facility] Progress Note   Assessment/ Plan:   1. Acute kidney Injury on chronic kidney disease stage IV-V: Anuric and suspected to have likely progressed on to ESRD.  Etiology appears to be likely ATN associated with sepsis vs hemodynamically mediated.  Started on CRRT yesterday for management of hyperkalemia and profound metabolic acidosis.  She remains pressor dependent and with improving labs on CRRT. 2.  Severe metabolic acidosis: From lactic acid with unclear source-on broad-spectrum antibiotic therapy with Zosyn and cultures pending.  Improving with CRRT; will adjust CRRT prescription based on subsequent labs from today. 3.  Acute hypoxic respiratory failure: Status post emergent intubation yesterday and currently ventilator support.  Chest x-ray reviewed from this morning and shows evidence of vascular congestion comparable to yesterday's images. 4.  Septic versus cardiogenic shock: Source identification pending, remains on pressors and broad-spectrum antibiotics.  Subjective:   Intermittently tachycardic this morning, remains on pressors.   Objective:   BP (!) 98/51   Pulse (!) 58   Temp (!) 96.8 F (36 C) (Bladder)   Resp (!) 22   Ht 5' 5"  (1.651 m)   Wt 87.1 kg   SpO2 (!) 83%   BMI 31.95 kg/m   Intake/Output Summary (Last 24 hours) at 02/25/2018 0738 Last data filed at 02/25/2018 0700 Gross per 24 hour  Intake 2642.45 ml  Output 1565 ml  Net 1077.45 ml   Weight change:   Physical Exam: Gen: Intubated, sedated CVS: Regular tachycardia, S1 and S2 normal Resp: Anteriorly clear to auscultation, no distinct rales or rhonchi Abd: Soft, obese, nontender Ext: 1+ dependent edema  Imaging: US Renal  Result Date: 02/25/2018 CLINICAL DATA:  Acute renal disease EXAM: RENAL / URINARY TRACT ULTRASOUND COMPLETE COMPARISON:  CT 06/19/2016 FINDINGS: Right Kidney: Renal measurements: 8.5  cm length by 4.4 cm height by 4.3 cm wide = volume: 83.1 mL. Cortical echogenicity normal. Cyst in the midpole measuring 1.4 x 1.5 x 1.5 cm. Left Kidney: Renal measurements: 8.9 cm length by 4.7 cm height by 5.2 cm wide = volume: 114.4 mL. Echogenicity within normal limits. No mass or hydronephrosis visualized. Bladder: The bladder is empty IMPRESSION: 1. Negative for hydronephrosis. 2. Single cyst in the right kidney. Electronically Signed   By: Donavan Foil M.D.   On: 02/25/2018 01:20   Dg Chest Port 1 View  Result Date: 02/25/2018 CLINICAL DATA:  85 year old female respiratory failure. Subsequent encounter. EXAM: PORTABLE CHEST 1 VIEW COMPARISON:  02/21/2018 FINDINGS: Endotracheal tube tip 2.3 cm above the carina. Nasogastric tube courses below the diaphragm. Tip is not included on the present exam. Right central line tip proximal superior vena cava level. Cardiomegaly. Can not exclude pericardial effusion. Calcified aorta. Mid to lower lobe subsegmental atelectasis bilaterally. Left base infiltrate not excluded. Slight decrease in degree of pulmonary vascular congestion (most notable centrally). IMPRESSION: 1. Slight decrease in degree of pulmonary vascular congestion. 2. Left base atelectasis versus infiltrate. 3. Subsegmental atelectasis mid to lower lobe bilaterally. 4. Prominent cardiomegaly. 5.  Aortic Atherosclerosis (ICD10-I70.0). 6. Nasogastric tube placed. Tip not included on present exam. Endotracheal tube and right central line once again noted. Electronically Signed   By: Genia Del M.D.   On: 02/25/2018 06:25   Portable Chest X-ray  Result Date: 02/20/2018 CLINICAL DATA:  Respiratory failure EXAM: PORTABLE CHEST 1 VIEW COMPARISON:  02/23/2018 FINDINGS: Cardiac shadow remains enlarged. Endotracheal tube is now seen  in satisfactory position 2 cm above the carina. Central venous catheter is noted on the right extending to the proximal superior vena cava. Mild vascular congestion is noted.  No focal confluent infiltrate is seen. No pneumothorax is noted. IMPRESSION: Endotracheal tube in satisfactory position. Right jugular central line noted in the proximal superior vena cava. Mild vascular congestion without focal infiltrate. Electronically Signed   By: Inez Catalina M.D.   On: 02/09/2018 21:50   Dg Chest Port 1 View  Result Date: 02/06/2018 CLINICAL DATA:  Shortness of breath and cough today. EXAM: PORTABLE CHEST 1 VIEW COMPARISON:  PA and lateral chest 10/23/2017 and 06/01/2017. FINDINGS: There is marked cardiomegaly. Pulmonary edema is present. No pneumothorax or pleural effusion. Aortic atherosclerosis is noted. IMPRESSION: Cardiomegaly and pulmonary edema. Atherosclerosis Electronically Signed   By: Inge Rise M.D.   On: 03/04/2018 17:06    Labs: BMET Recent Labs  Lab 02/14/2018 1507 03/07/2018 1708 02/23/2018 2113 02/25/18 0515  NA 137 139 138 138  K 6.9* 7.2* 6.0* 4.2  CL 109 110 110 102  CO2 8* 8* 10* 17*  GLUCOSE 182* 165* 273* 71  BUN 445* 104* 111* 87*  CREATININE 6.03* 5.95* 6.14* 4.92*  CALCIUM 8.5* 8.6* 7.9* 7.6*  PHOS  --   --   --  5.4*   CBC Recent Labs  Lab 02/14/2018 1507 02/20/2018 2113 02/25/18 0515  WBC 27.5* 25.2* 25.4*  NEUTROABS 23.3*  --   --   HGB 12.0 11.8* 12.5  HCT 42.5 39.8 40.5  MCV 103.2* 98.0 93.3  PLT 159 147* 160    Medications:    . chlorhexidine gluconate (MEDLINE KIT)  15 mL Mouth Rinse BID  . Chlorhexidine Gluconate Cloth  6 each Topical Q0600  . fentaNYL (SUBLIMAZE) injection  50 mcg Intravenous Once  . insulin aspart  0-9 Units Subcutaneous Q4H  . mouth rinse  15 mL Mouth Rinse 10 times per day  . mupirocin ointment  1 application Nasal BID  . pantoprazole (PROTONIX) IV  40 mg Intravenous Daily   Elmarie Shiley, MD 02/25/2018, 7:38 AM

## 2018-02-25 NOTE — Progress Notes (Signed)
CRITICAL VALUE ALERT  Critical Value:  PTT 192  Date & Time Notied:  02/25/18 1620  Provider Notified: CCM Resident [id number]  Orders Received/Actions taken: Heparin stopped and labs redrawn

## 2018-02-25 NOTE — Progress Notes (Signed)
Pt HR sustaining afib RVR, 150's. Started on amio infusion. Blood pressure dropped into 38'G systolic. CCM MD and residents at bedside. Amio infusion stopped and patient on levophed and vasopressin  MD updated family of change in patient condition, chaplain paged and family at bedside, emotional support given.   RN will continue to monitor.

## 2018-02-25 NOTE — Progress Notes (Signed)
3 rings removed from patient and placed in specimen cup w/ patient label at bedside.

## 2018-02-25 NOTE — Progress Notes (Addendum)
ANTICOAGULATION CONSULT NOTE - Follow Up Consult  Pharmacy Consult for IV Heparin Indication: atrial fibrillation  Allergies  Allergen Reactions  . Codeine Nausea And Vomiting  . Sulfa Antibiotics Nausea And Vomiting  . Sulfur Nausea And Vomiting    Patient Measurements: Height: 5\' 5"  (165.1 cm) Weight: 192 lb 0.3 oz (87.1 kg) IBW/kg (Calculated) : 57 Heparin Dosing Weight: 79 kg  Vital Signs: Temp: 97.3 F (36.3 C) (01/21 1113) Temp Source: Core (01/21 1113) BP: 107/44 (01/21 1610) Pulse Rate: 104 (01/21 1600)  Labs: Recent Labs    02/22/2018 1507  02/08/2018 2113 02/25/18 0300 02/25/18 0515 02/25/18 0820 02/25/18 1502  HGB 12.0  --  11.8*  --  12.5  --   --   HCT 42.5  --  39.8  --  40.5  --   --   PLT 159  --  147*  --  160  --   --   APTT  --   --  42*  --   --   --  192*  HEPARINUNFRC  --   --   --   --   --   --  >2.20*  CREATININE 6.03*   < > 6.14*  --  4.92*  --  4.01*  TROPONINI 0.32*  --  0.52* 0.70*  --  1.09*  --    < > = values in this interval not displayed.    Estimated Creatinine Clearance: 11.2 mL/min (A) (by C-G formula based on SCr of 4.01 mg/dL (H)).   Medications:  Infusions:  . dextrose 50 mL/hr at 02/25/18 1600  . fentaNYL infusion INTRAVENOUS 50 mcg/hr (02/25/18 1600)  . heparin 1,100 Units/hr (02/25/18 1600)  . norepinephrine (LEVOPHED) Adult infusion 57 mcg/min (02/25/18 1600)  . piperacillin-tazobactam (ZOSYN)  IV Stopped (02/25/18 1305)  . prismasol BGK 0/2.5 1,000 mL/hr at 02/25/18 1415  .  sodium bicarbonate (isotonic) infusion in sterile water 50 mL/hr at 02/25/18 1600  . sodium bicarbonate (isotonic) 1000 mL infusion 100 mL/hr at 02/25/18 1539  . sodium bicarbonate (isotonic) 1000 mL infusion 100 mL/hr at 02/25/18 0646  . sodium chloride 999 mL/hr at 02/15/2018 2306  . vasopressin (PITRESSIN) infusion - *FOR SHOCK* 0.04 Units/min (02/25/18 1600)    Assessment: 85 year old female transitioned from oral Eliquis to IV Heparin.    Baseline aPTT was 42.  Initial 8 hour aPTT came back SUPRA-therapeutic at 196 and HL >2.20. Utilizing aPTT for now due to recent Eliquis' effects on HL.   Patient does not have signs of bleeding currently. CBC has been stable. Discussed with RN - level was drawn from art line, not where IV Heparin is infusion. No Heparin via the CRRT machine at this time. Patient has ESRD on CRRT.   Goal of Therapy:  Heparin level 0.3-0.7 units/ml aPTT 66-102 seconds Monitor platelets by anticoagulation protocol: Yes   Plan:  Repeat levels STAT to ensure accuracy (do NOT draw from line where IV Heparin is infusing).  Hold IV Heparin x1 hour and then reduce to 850 units/hr.  Will review STAT levels and adjust if needed.  After resuming IV Heparin, will recheck aPTT in 8 hours.   Sloan Leiter, PharmD, BCPS, BCCCP Clinical Pharmacist Please refer to [facility] for [place] numbers 02/25/2018,4:33 PM   Addendum (1730pm): Recheck aPTT >200. No bleeding noted. No issues with infusion. Heparin has been on hold and should be out of system. Will recheck aPTT to ensure level has fallen. Will start at much lower  rate of 750 units/hr when aPTT <80 and recheck in 6 hours.   Addnedum 6:45 PM: After holding IV Heparin x1 hour, aPTT still elevated at 124. Will continue to hold and recheck in 1 hour with plan to resume when aPTT <80.

## 2018-02-25 NOTE — Progress Notes (Signed)
Responded to unit page to support family at bedside.  Patient is not doing well and declining. Dr spoke with family and told them of patient status. Provided prayer, emotional and spiritual support.  Will follow as needed.  Jaclynn Major, [place], [facility], Pager [phone]

## 2018-02-25 NOTE — Progress Notes (Signed)
CRITICAL VALUE ALERT  Critical Value:  Lactic acid-5.1  Date & Time Notied:  02/25/2018 1:12 AM  Provider Notified: Dr. Oletta Darter  Orders Received/Actions taken:

## 2018-02-25 NOTE — Progress Notes (Signed)
CRITICAL VALUE ALERT  Critical Value:  Trop-0.52  Date & Time Notied:  03/01/2018 @ 2225  Provider Notified: Shearon Stalls  Orders Received/Actions taken:

## 2018-02-25 NOTE — Progress Notes (Signed)
eLink Physician-Brief Progress Note Patient Name: [name] [name] DOB: 06/23/1930 MRN: [id number]   Date of Service  02/25/2018  HPI/Events of Note  AFIB with RVR - Ventricular rate = 115-140. Currently on Norepinephrine IV infusion at 40 mcg/min.   eICU Interventions  Will order: 1. Restart Phenylephrine IV infusion. Titrate to MAP > = 65.  2. Wean Norepinephrine IV infusion as tolerated.  3. Monitor CVP now and Q 4 hours.      Intervention Category Major Interventions: Arrhythmia - evaluation and management  Sommer,Steven Eugene 02/25/2018, 9:26 PM

## 2018-02-25 NOTE — Progress Notes (Signed)
Hypoglycemic Event  CBG: 33  Treatment: D50 25 mL (12.5 gm)  Symptoms: None  Follow-up CBG: Time: 0815 CBG Result: 76  Possible Reasons for Event: Unknown  Comments/MD notified: agarwala     Phill Myron D'Adamo

## 2018-02-26 LAB — CBC
HCT: 40.1 % (ref 36.0–46.0)
Hemoglobin: 12.6 g/dL (ref 12.0–15.0)
MCH: 29.6 pg (ref 26.0–34.0)
MCHC: 31.4 g/dL (ref 30.0–36.0)
MCV: 94.1 fL (ref 80.0–100.0)
Platelets: 137 10*3/uL — ABNORMAL LOW (ref 150–400)
RBC: 4.26 MIL/uL (ref 3.87–5.11)
RDW: 19.8 % — ABNORMAL HIGH (ref 11.5–15.5)
WBC: 15.6 10*3/uL — ABNORMAL HIGH (ref 4.0–10.5)
nRBC: 14.2 % — ABNORMAL HIGH (ref 0.0–0.2)

## 2018-02-26 LAB — RENAL FUNCTION PANEL
Albumin: 2.9 g/dL — ABNORMAL LOW (ref 3.5–5.0)
Albumin: 2.9 g/dL — ABNORMAL LOW (ref 3.5–5.0)
Anion gap: 23 — ABNORMAL HIGH (ref 5–15)
Anion gap: 20 — ABNORMAL HIGH (ref 5–15)
BUN: 52 mg/dL — ABNORMAL HIGH (ref 8–23)
BUN: 44 mg/dL — ABNORMAL HIGH (ref 8–23)
CO2: 22 mmol/L (ref 22–32)
CO2: 29 mmol/L (ref 22–32)
Calcium: 6.5 mg/dL — ABNORMAL LOW (ref 8.9–10.3)
Calcium: 6.3 mg/dL — CL (ref 8.9–10.3)
Chloride: 88 mmol/L — ABNORMAL LOW (ref 98–111)
Chloride: 84 mmol/L — ABNORMAL LOW (ref 98–111)
Creatinine, Ser: 3.31 mg/dL — ABNORMAL HIGH (ref 0.44–1.00)
Creatinine, Ser: 2.65 mg/dL — ABNORMAL HIGH (ref 0.44–1.00)
GFR calc Af Amer: 14 mL/min — ABNORMAL LOW (ref >60)
GFR calc Af Amer: 18 mL/min — ABNORMAL LOW (ref >60)
GFR calc non Af Amer: 12 mL/min — ABNORMAL LOW (ref >60)
GFR calc non Af Amer: 16 mL/min — ABNORMAL LOW (ref >60)
Glucose, Bld: 181 mg/dL — ABNORMAL HIGH (ref 70–99)
Glucose, Bld: 169 mg/dL — ABNORMAL HIGH (ref 70–99)
Phosphorus: 4.5 mg/dL (ref 2.5–4.6)
Phosphorus: 3.5 mg/dL (ref 2.5–4.6)
Potassium: 3.6 mmol/L (ref 3.5–5.1)
Potassium: 3.7 mmol/L (ref 3.5–5.1)
Sodium: 133 mmol/L — ABNORMAL LOW (ref 135–145)
Sodium: 133 mmol/L — ABNORMAL LOW (ref 135–145)

## 2018-02-26 LAB — GLUCOSE, CAPILLARY
Glucose-Capillary: 132 mg/dL — ABNORMAL HIGH (ref 70–99)
Glucose-Capillary: 146 mg/dL — ABNORMAL HIGH (ref 70–99)
Glucose-Capillary: 161 mg/dL — ABNORMAL HIGH (ref 70–99)
Glucose-Capillary: 161 mg/dL — ABNORMAL HIGH (ref 70–99)
Glucose-Capillary: 176 mg/dL — ABNORMAL HIGH (ref 70–99)
Glucose-Capillary: 190 mg/dL — ABNORMAL HIGH (ref 70–99)

## 2018-02-26 LAB — URINE CULTURE: Culture: <10000 — AB

## 2018-02-26 LAB — HEPATIC FUNCTION PANEL
ALT: 670 U/L — ABNORMAL HIGH (ref 0–44)
AST: 1005 U/L — ABNORMAL HIGH (ref 15–41)
Albumin: 2.9 g/dL — ABNORMAL LOW (ref 3.5–5.0)
Alkaline Phosphatase: 90 U/L (ref 38–126)
Bilirubin, Direct: 1.7 mg/dL — ABNORMAL HIGH (ref 0.0–0.2)
Indirect Bilirubin: 1.4 mg/dL — ABNORMAL HIGH (ref 0.3–0.9)
Total Bilirubin: 3.1 mg/dL — ABNORMAL HIGH (ref 0.3–1.2)
Total Protein: 6.4 g/dL — ABNORMAL LOW (ref 6.5–8.1)

## 2018-02-26 LAB — PATHOLOGIST SMEAR REVIEW

## 2018-02-26 LAB — HEPARIN LEVEL (UNFRACTIONATED): Heparin Unfractionated: >2.2 IU/mL — ABNORMAL HIGH (ref 0.30–0.70)

## 2018-02-26 LAB — APTT
aPTT: 95 seconds — ABNORMAL HIGH (ref 24–36)
aPTT: 115 seconds — ABNORMAL HIGH (ref 24–36)
aPTT: 91 seconds — ABNORMAL HIGH (ref 24–36)

## 2018-02-26 LAB — MAGNESIUM: Magnesium: 1.9 mg/dL (ref 1.7–2.4)

## 2018-02-26 MED ORDER — VANCOMYCIN HCL IN DEXTROSE 750-5 MG/150ML-% IV SOLN
750.0000 mg | INTRAVENOUS | Status: DC
  Filled 2018-02-26: qty 150

## 2018-02-26 MED ORDER — PIPERACILLIN-TAZOBACTAM 3.375 G IVPB
3.3750 g | Freq: Four times a day (QID) | INTRAVENOUS | Status: DC
  Administered 2018-02-26: 3.375 g via INTRAVENOUS
  Filled 2018-02-26 (×5): qty 50

## 2018-02-26 MED ORDER — PHENYLEPHRINE HCL-NACL 40-0.9 MG/250ML-% IV SOLN
0.0000 ug/min | INTRAVENOUS | Status: DC
  Administered 2018-02-26: 75 ug/min via INTRAVENOUS
  Administered 2018-02-26: 200 ug/min via INTRAVENOUS
  Administered 2018-02-26: 140 ug/min via INTRAVENOUS
  Filled 2018-02-26 (×3): qty 250

## 2018-02-26 MED ORDER — DIGOXIN 0.25 MG/ML IJ SOLN
0.2500 mg | Freq: Four times a day (QID) | INTRAMUSCULAR | Status: AC
  Administered 2018-02-26 – 2018-02-27 (×2): 0.25 mg via INTRAVENOUS
  Filled 2018-02-26 (×2): qty 2

## 2018-02-26 MED ORDER — VANCOMYCIN HCL 10 G IV SOLR
1500.0000 mg | Freq: Once | INTRAVENOUS | Status: AC
  Administered 2018-02-26: 1500 mg via INTRAVENOUS
  Filled 2018-02-26: qty 1500

## 2018-02-26 MED ORDER — CALCIUM CARBONATE ANTACID 1250 MG/5ML PO SUSP
1000.0000 mg | Freq: Three times a day (TID) | ORAL | Status: DC
  Administered 2018-02-26 – 2018-03-09 (×31): 1000 mg
  Filled 2018-02-26 (×37): qty 10

## 2018-02-26 MED ORDER — DIGOXIN 0.25 MG/ML IJ SOLN
0.1250 mg | Freq: Every day | INTRAMUSCULAR | Status: DC
  Filled 2018-02-26: qty 2

## 2018-02-26 MED ORDER — PRISMASOL BGK 4/2.5 32-4-2.5 MEQ/L IV SOLN
INTRAVENOUS | Status: DC
  Administered 2018-02-26 – 2018-03-01 (×21): via INTRAVENOUS_CENTRAL
  Filled 2018-02-26 (×33): qty 5000

## 2018-02-26 MED ORDER — PIPERACILLIN-TAZOBACTAM 3.375 G IVPB 30 MIN
3.3750 g | Freq: Four times a day (QID) | INTRAVENOUS | Status: DC
  Administered 2018-02-26 – 2018-02-27 (×3): 3.375 g via INTRAVENOUS
  Filled 2018-02-26 (×4): qty 50

## 2018-02-26 MED ORDER — DIGOXIN 0.25 MG/ML IJ SOLN
0.5000 mg | Freq: Once | INTRAMUSCULAR | Status: AC
  Administered 2018-02-26: 0.5 mg via INTRAVENOUS
  Filled 2018-02-26: qty 2

## 2018-02-26 NOTE — Progress Notes (Signed)
CRITICAL VALUE ALERT  Critical Value:  Ca 6.3  Date & Time Notied:  1/22 1652  Provider Notified: Dr. Posey Pronto  Orders Received/Actions taken: replacement ordered

## 2018-02-26 NOTE — Progress Notes (Signed)
NAME:  [name] [name], MRN:  [id number], DOB:  03/05/1930, LOS: 2 ADMISSION DATE:  03/04/2018, CONSULTATION DATE:  03/05/2018 REFERRING MD:  Martin Majestic - [facility]  CHIEF COMPLAINT:  SOB   Brief History   This is an 85 year old female who presented to [facility] with SOB, cough, weakness and found to have multiple metabolic derangements including AoCKD with Scr 6, BUN 445, K 7.2. Received temporizing measures and transferred to [facility] for further management.   History of present illness   [name] [name] is a 85 y.o. female who has a PMH as outlined below (see "past medical history") including HTN, HLD, CHF, a fib, and CKD.  She presented to [facility] ED 1/20 with SOB, cough, weakness.  On EMS arrival, she was found to be hypoglycemic to around 50.  She received oral glucose and IM glucagon.  She was taken to ED where she remained with generalized weakness.  She was found to have multiple metabolic derangements including K 7.2, SCr 6.03, BUN 445 (165 on repeat), AG 20, BNP 2594, Trop 0.32, lactate 7.8, WBC 27.5.  ABG demonstrated metabolic + respiratory acidosis (7.0 / 38 / 42).  She was transferred to [facility] for further management with possibility of emergent HD.  After arrival to [facility], she was hypotensive with SBP in 70s.  Additionally, she had kussmaul respirations and although she was responsive, she was slow to respond.  She was started on BiPAP but later required intubation for continued respiratory insufficiency.  Past Medical History  HTN, HLD, CHF, A.fib, CKD, DM, diverticulosis, GERD.  Significant Hospital Events   1/20 >Admitted to ICU  Consults:  Nephrology  Procedures:  ETT 1/20 >  R IJ HD cath 1/20 >   Significant Diagnostic Tests:  CXR 1/20 > CM with pulmonary edema. Echo 1/20 >  Impressions:  - Compared to a prior study in 2019, the LVEF is further reduced to   20-25% with more severe RV hypokinesis, inferior and lateral LV   hypokinesis to akinesis and elevated biventricular filling    pressure.    Micro Data:  Blood 1/20 > NGTD Sputum 1/20 > Staphylococcus aureus and klebsiella pneumonia Urine 1/20 > <10K colonies, insignificant  Antimicrobials:  Zosyn 02/24/17  Interim history/subjective:  Patient was intubated and sedated.  She was still having a fib with RVR last night and was restarted on phenylephrine. She does open her eyes to voice however does not follow commands.   Objective   Blood pressure (!) 118/50, pulse (!) 106, temperature 98.2 F (36.8 C), resp. rate (!) 26, height 5\' 5"  (1.651 m), weight 87 kg, SpO2 98 %. CVP:  [15 mmHg-16 mmHg] 16 mmHg  Vent Mode: PRVC FiO2 (%):  [40 %] 40 % Set Rate:  [18 bmp] 18 bmp Vt Set:  [450 mL] 450 mL PEEP:  [5 cmH20] 5 cmH20 Plateau Pressure:  [19 cmH20-24 cmH20] 21 cmH20   Intake/Output Summary (Last 24 hours) at 02/26/2018 0630 Last data filed at 02/26/2018 0600 Gross per 24 hour  Intake 5857.37 ml  Output 5319 ml  Net 538.37 ml   Filed Weights   02/05/2018 2040 02/25/18 0415 02/26/18 0300  Weight: 87.5 kg 87.1 kg 87 kg    Examination: General: Intubated, sedated, critically ill appearing, awake, opens eyes to voice HENT: Central line in place, atraumatic, normocephalic Lungs: Bibasilar crackles Cardiovascular: Tachycardia, no m/r/g Abdomen: Soft, non-tender, no distension Extremities: Trace BL LE edema, cold hands, warm knees Neuro: Sedated, awake, not following commands  GU: Foley in place, no urine output  Resolved Hospital Problem list    Assessment & Plan:  Hypoxic respiratory distress 2/2 pulmonary edema Cardiogenic shock with a possible component of septic shock Leukocytosis: Metabolic acidosis: -Patient is now on vasopressin, phenyleprhine, and levophed at this time. Goal MAP >65. WBC improved to 15 from 25. Lactic acid has improved. Echocardiogram showed EF 20-25%, with severe RV hypokinesis, interior and lateral LV hypokinesis and elevated biventricular filling pressures. Likely worsening  on her heart failure however may have a component of septic shock. Respiratory culture has shown  Staphylococcus aureus and klebsiella pneumonia, sensitivities still pending. Currently on zosyn. She still have some bibasilar crackles on exam and her CVP is around 15-16.  -Patient does not appear to be having much improvement, she is now requiring 3 vasopressors to maintain a MAP >65. She does appear to have a respiratory infection with staph aureus and kelbsiella and may have a component of septic shock. We will continue the antibiotics and add vancomycin to help with the infection.  -Continue fentanyl and versed for sedation -Daily sedation vacation, daily SBT -Sodium bicarbonate maintenance fluids stopped by nephrology -Add vancomycin  -Continue zosyn -Continue neosyynephrine, wean as tolerated -Continue levophed drip -Continue vasopressin  AoCKD: Hyperkalemia: -Nephrology following and managing CRRT. They suspect that her CKD has progressed to ESRD, etiology is thought to be ATN. Patient remains anuric.  -Begin ultrafiltration with CRRT per neprhology.  -Baseline Cr of around 2. She was significantly hyperkalemic up to 7.2 on admission.  -Today labs showed improvement in her electrolytes.   -Discontinue foley  Transaminitis: Likely 2/2 shock liver. Today's labs are similar to yesterday. Continue to monitor.    Elevated troponin: -Troponin has trended upwards, 0.52 > 0.7 > 1.09 -Likely 2/2 demand ischemia  Diabetes mellitus: -Had started a D10 drip yesterday and trickle feeds due to hypoglycemia. Today her CBGs have remained elevated. Will discontinue D10 drip.  -SSI -Frequent CBGS  HTN, HLD, HFrEF Atrial fibrillation with RVR -Currently on heparin -Holding apixiban, lasix, toprol, and pravastatin  -Tried starting her on amiodarone drip for her atrial fibrillation however she became severely hypotensive and had to be placed in trandelenburg position and we had to increase her  pressor support.  -Currently HR is in the 120-160s.   Gout: -On allopurinol at home, currently holding this  Best practice:  Diet: Trickle feeds Pain/Anxiety/Delirium protocol (if indicated): Fentanyl and midazolam, RASS goal 0 to -1. VAP protocol (if indicated): Yes DVT prophylaxis: SCDs, heparin GI prophylaxis: PPI Glucose control: SSI Mobility: Bedrest Code Status: DNR Family Communication: Spoke with daughter  Disposition: Remain in ICU  Labs   CBC: Recent Labs  Lab 02/06/2018 1507 02/19/2018 2113 02/25/18 0515 02/26/18 0526  WBC 27.5* 25.2* 25.4* 15.6*  NEUTROABS 23.3*  --   --   --   HGB 12.0 11.8* 12.5 12.6  HCT 42.5 39.8 40.5 40.1  MCV 103.2* 98.0 93.3 94.1  PLT 159 147* 160 137*    Basic Metabolic Panel: Recent Labs  Lab 02/23/2018 1708 02/28/2018 2113 02/25/18 0515 02/25/18 1502 02/26/18 0526  NA 139 138 138 135 133*  K 7.2* 6.0* 4.2 4.2 3.6  CL 110 110 102 94* 88*  CO2 8* 10* 17* 20* 22  GLUCOSE 165* 273* 71 141* 181*  BUN 104* 111* 87* 65* 52*  CREATININE 5.95* 6.14* 4.92* 4.01* 3.31*  CALCIUM 8.6* 7.9* 7.6* 6.8* 6.5*  MG  --   --  2.2  --   --  PHOS  --   --  5.4* 4.4 4.5   GFR: Estimated Creatinine Clearance: 13.5 mL/min (A) (by C-G formula based on SCr of 3.31 mg/dL (H)). Recent Labs  Lab 02/17/2018 1507 02/28/2018 1755 02/05/2018 2113 02/25/18 0034 02/25/18 0515 02/26/18 0526  WBC 27.5*  --  25.2*  --  25.4* 15.6*  LATICACIDVEN 7.8* 7.6* 5.3* 5.1*  --   --     Liver Function Tests: Recent Labs  Lab 03/04/2018 1507 02/26/2018 2113 02/25/18 0515 02/25/18 1502 02/26/18 0526  AST 644* 972* 1,162*  --   --   ALT 295* 450* 597*  --   --   ALKPHOS 93 87 94  --   --   BILITOT 2.6* 2.5* 2.6*  --   --   PROT 7.3 6.6 6.9  --   --   ALBUMIN 3.8 3.4* 3.5 3.2* 2.9*   Recent Labs  Lab 02/21/2018 1507  LIPASE 26   No results for input(s): AMMONIA in the last 168 hours.  ABG    Component Value Date/Time   PHART 7.396 02/25/2018 0644   PCO2ART  32.5 02/25/2018 0644   PO2ART 127.0 (H) 02/25/2018 0644   HCO3 20.1 02/25/2018 0644   TCO2 21 (L) 02/25/2018 0644   ACIDBASEDEF 4.0 (H) 02/25/2018 0644   O2SAT 99.0 02/25/2018 0644     Coagulation Profile: No results for input(s): INR, PROTIME in the last 168 hours.  Cardiac Enzymes: Recent Labs  Lab 02/16/2018 1507 02/14/2018 2113 02/25/18 0300 02/25/18 0820  TROPONINI 0.32* 0.52* 0.70* 1.09*    HbA1C: Hgb A1c MFr Bld  Date/Time Value Ref Range Status  10/23/2017 03:06 PM 7.9 (H) 4.8 - 5.6 % Final    Comment:    (NOTE) Pre diabetes:          5.7%-6.4% Diabetes:              >6.4% Glycemic control for   <7.0% adults with diabetes   09/17/2010 05:27 AM 6.3 (H) <5.7 % Final    Comment:    (NOTE)                                                                       According to the ADA Clinical Practice Recommendations for 2011, when HbA1c is used as a screening test:  >=6.5%   Diagnostic of Diabetes Mellitus           (if abnormal result is confirmed) 5.7-6.4%   Increased risk of developing Diabetes Mellitus References:Diagnosis and Classification of Diabetes Mellitus,Diabetes HWEX,9371,69(CVELF 1):S62-S69 and Standards of Medical Care in         Diabetes - 2011,Diabetes Care,2011,34 (Suppl 1):S11-S61.    CBG: Recent Labs  Lab 02/25/18 1141 02/25/18 1532 02/25/18 1933 02/25/18 2359 02/26/18 0354  GLUCAP 105* 140* 214* 176* 190*    Review of Systems:   Unable to obtain due to patients mental status  Past Medical History  She,  has a past medical history of Acid reflux, ARF (acute renal failure) ([place]) (09/16/2010), Atrial fibrillation (HCC), CHF (congestive heart failure) ([place]), Chronic anticoagulation, CKD (chronic kidney disease) stage 3, GFR 30-59 ml/min ([place]), Diabetes mellitus without complication ([place]), Diverticular hemorrhage (2011), Diverticulosis (2011), GI bleeding (08/03/2011), Gout, medication noncompliance, Hypercholesterolemia, Hyperglycemia,  Hypertension, Internal hemorrhoids (2011), Seasonal allergies, and Thrombocytopenia due to drugs.   Surgical History    Past Surgical History:  Procedure Laterality Date  . ADENOIDECTOMY    . APPENDECTOMY    . BUNIONECTOMY    . CATARACT EXTRACTION    . CESAREAN SECTION    . ESOPHAGOGASTRODUODENOSCOPY  08/20/09   small hiatal hernia/mild gastritis  . ileocolonoscopy  08/20/09   normal terminal ileum/pancolonic diverticula/no polyps/benign colon mucosa  . JOINT REPLACEMENT     Total right knee 2007  . LAPAROTOMY  09/22/2010   Procedure: EXPLORATORY LAPAROTOMY;  Surgeon: [name];  Location: [facility];  Service: General;  Laterality: N/A;  Lysis of Adhesions  . TONSILLECTOMY    . TUBAL LIGATION       Social History   reports that she has never smoked. She has never used smokeless tobacco. She reports that she does not drink alcohol or use drugs.   Family History   Her family history includes Diabetes in her mother; Heart disease in her mother.   Allergies Allergies  Allergen Reactions  . Codeine Nausea And Vomiting  . Sulfa Antibiotics Nausea And Vomiting  . Sulfur Nausea And Vomiting     Home Medications  Prior to Admission medications   Medication Sig Start Date End Date Taking? Authorizing Provider  acetaminophen (TYLENOL) 325 MG tablet Take 2 tablets (650 mg total) by mouth every 4 (four) hours as needed for headache or mild pain. 10/27/17  Yes Emokpae, Courage, MD  allopurinol (ZYLOPRIM) 100 MG tablet Take 100 mg by mouth daily.   Yes [provider]  amitriptyline (ELAVIL) 25 MG tablet Take 25 mg by mouth at bedtime as needed for sleep.  02/14/18  Yes [provider]  apixaban (ELIQUIS) 2.5 MG TABS tablet Take 1 tablet (2.5 mg total) by mouth 2 (two) times daily. 10/27/17  Yes Emokpae, Courage, MD  Artificial Tear Ointment (DRY EYES OP) Place 1 drop into both eyes as needed (dry eyes).   Yes [provider]  ferrous sulfate 325 (65 FE) MG  tablet Take 325 mg by mouth 2 (two) times daily with a meal.    Yes [provider]  fluticasone (FLONASE) 50 MCG/ACT nasal spray Place 1 spray into both nostrils daily.   Yes [provider]  furosemide (LASIX) 40 MG tablet Take 1 tablet (40 mg total) by mouth daily. Patient taking differently: Take 20 mg by mouth daily.  10/27/17  Yes Emokpae, Courage, MD  glimepiride (AMARYL) 2 MG tablet Take 1 tablet (2 mg total) by mouth daily with breakfast. 10/28/17  Yes Emokpae, Courage, MD  levalbuterol (XOPENEX) 1.25 MG/3ML nebulizer solution Take 1.25 mg by nebulization every 4 (four) hours as needed for wheezing. 09/10/17  Yes Valentina Shaggy, MD  loratadine (CLARITIN) 10 MG tablet Take 10 mg by mouth daily.   Yes [provider]  metoprolol succinate (TOPROL-XL) 25 MG 24 hr tablet Take 25 mg by mouth every morning.   Yes [provider]  montelukast (SINGULAIR) 10 MG tablet Take 10 mg by mouth at bedtime.    Yes [provider]  Multiple Vitamin (MULTIVITAMIN WITH MINERALS) TABS tablet Take 1 tablet by mouth daily.   Yes [provider]  omeprazole (PRILOSEC) 20 MG capsule Take 1 capsule (20 mg total) by mouth daily. Patient taking differently: Take 20 mg by mouth every morning.  06/19/16  Yes Maczis, Barth Kirks, PA-C  potassium chloride SA (K-DUR,KLOR-CON) 20 MEQ tablet Take 1  tablet (20 mEq total) by mouth daily. 04/01/16  Yes Isaac Bliss, Rayford Halsted, MD  pravastatin (PRAVACHOL) 40 MG tablet Take 40 mg by mouth every evening.    Yes [provider]  sodium chloride (OCEAN) 0.65 % SOLN nasal spray Place 1 spray into both nostrils as needed for congestion.   Yes [provider]  Vitamin D, Ergocalciferol, (DRISDOL) 50000 units CAPS capsule Take 50,000 Units by mouth every 30 (thirty) days. Administered on the 1st of each month   Yes [provider]         Asencion Noble, M.D. PGY1 Pager [phone] 02/26/2018  6:30 AM

## 2018-02-26 NOTE — Progress Notes (Addendum)
Pharmacy Antibiotic Note  [name] [name] is a 85 y.o. female admitted on 02/20/2018 with pneumonia.  Pharmacy has been consulted for Vancomycin and Zosyn. TA positive for S. aureus and GNR  WBC trending down to 15.6, afebrile  Of note, patient on CRRT.   Plan: Initiate Vancomycin loading dose 1500 mg IV x1 then maintenance dose 750 IV every 24h Continue Zosyn 2.25 g IV every 6h  Monitor cultures, WBC, temp, and vanc level as indicated  Height: 5\' 5"  (165.1 cm) Weight: 191 lb 12.8 oz (87 kg) IBW/kg (Calculated) : 57  Temp (24hrs), Avg:98.2 F (36.8 C), Min:97.3 F (36.3 C), Max:98.6 F (37 C)  Recent Labs  Lab 02/12/2018 1507 02/16/2018 1708 02/17/2018 1755 03/01/2018 2113 02/25/18 0034 02/25/18 0515 02/25/18 1502 02/26/18 0526  WBC 27.5*  --   --  25.2*  --  25.4*  --  15.6*  CREATININE 6.03* 5.95*  --  6.14*  --  4.92* 4.01* 3.31*  LATICACIDVEN 7.8*  --  7.6* 5.3* 5.1*  --   --   --     Estimated Creatinine Clearance: 13.5 mL/min (A) (by C-G formula based on SCr of 3.31 mg/dL (H)).    Allergies  Allergen Reactions  . Codeine Nausea And Vomiting  . Sulfa Antibiotics Nausea And Vomiting  . Sulfur Nausea And Vomiting    Antimicrobials this admission: 1/21 zosyn >>  1/22 Vanc >>   Microbiology results: 1/20 BCx: no growth 1/21 TA: abundant staph aureus, moderate GNR  1/2 MRSA PCR: positive   Thank you for allowing pharmacy to be a part of this patient's care.  Carlyon Shadow 02/26/2018 10:27 AM

## 2018-02-26 NOTE — Progress Notes (Signed)
ANTICOAGULATION CONSULT NOTE - Follow Up Consult  Pharmacy Consult for IV Heparin Indication: atrial fibrillation  Allergies  Allergen Reactions  . Codeine Nausea And Vomiting  . Sulfa Antibiotics Nausea And Vomiting  . Sulfur Nausea And Vomiting    Patient Measurements: Height: 5\' 5"  (165.1 cm) Weight: 191 lb 12.8 oz (87 kg) IBW/kg (Calculated) : 57 Heparin Dosing Weight: 79 kg  Vital Signs: Temp: 97.9 F (36.6 C) (01/22 1500) BP: 118/50 (01/22 0345) Pulse Rate: 97 (01/22 1500)  Labs: Recent Labs    03/02/2018 2113 02/25/18 0300 02/25/18 0515 02/25/18 0820 02/25/18 1502 02/25/18 1634  02/25/18 2015 02/26/18 0526 02/26/18 1353  HGB 11.8*  --  12.5  --   --   --   --   --  12.6  --   HCT 39.8  --  40.5  --   --   --   --   --  40.1  --   PLT 147*  --  160  --   --   --   --   --  137*  --   APTT 42*  --   --   --  192* >200*   < > 61* 95* 115*  HEPARINUNFRC  --   --   --   --  >2.20* >2.20*  --   --  >2.20*  --   CREATININE 6.14*  --  4.92*  --  4.01*  --   --   --  3.31*  --   TROPONINI 0.52* 0.70*  --  1.09*  --   --   --   --   --   --    < > = values in this interval not displayed.    Estimated Creatinine Clearance: 13.5 mL/min (A) (by C-G formula based on SCr of 3.31 mg/dL (H)).   Medications:  Infusions:  . sodium chloride Stopped (02/26/18 1341)  . dextrose Stopped (02/26/18 0757)  . fentaNYL infusion INTRAVENOUS 100 mcg/hr (02/26/18 1500)  . heparin 750 Units/hr (02/26/18 1500)  . norepinephrine (LEVOPHED) Adult infusion 13 mcg/min (02/26/18 1500)  . phenylephrine (NEO-SYNEPHRINE) Adult infusion Stopped (02/26/18 1147)  . piperacillin-tazobactam (ZOSYN)  IV 12.5 mL/hr at 02/26/18 1500  . prismasol BGK 4/2.5 1,500 mL/hr at 02/26/18 1408  . sodium bicarbonate (isotonic) 1000 mL infusion 200 mL/hr at 02/26/18 1316  . sodium bicarbonate (isotonic) 1000 mL infusion 200 mL/hr at 02/26/18 1410  . sodium chloride 999 mL/hr at 02/08/2018 2306  . [START ON  02/27/2018] vancomycin    . vasopressin (PITRESSIN) infusion - *FOR SHOCK* 0.04 Units/min (02/26/18 1500)    Assessment: 85 year old female transitioned from oral Eliquis to IV Heparin. Night shift RN noted appearance of blood in stool while cleaning up patient after supra-therapeutic aPTT.RN contacted MD to determine plan who was okay with resuming IV heparin now that aPTT was down. Heparin rate was decreased to 750 units / hr at that time.  APTT this afternoon was high at 115, up from 95. Contacted RN and confirmed no signs or symptoms of bleeding.  Hgb stable this AM at 12.6, hct stable at 40.1, platelets low at 137, down from 160.  Goal of Therapy:  Heparin level 0.3-0.7 units/ml aPTT 66-102 seconds Monitor platelets by anticoagulation protocol: Yes   Plan:  Decrease Heparin to 600 units/hr  Recheck aPTT at 2130 Monitor CBC and for any signs of bleeding Monitor daily aPTT and HL until correlating.   Thank you for allowing pharmacy to  be a part of this patient's care.  Tamela Gammon, PharmD 02/26/2018 3:15 PM PGY-1 Pharmacy Resident Direct Phone: [phone] Please check [email or web address] for unit-specific pharmacist phone numbers

## 2018-02-26 NOTE — Progress Notes (Signed)
RT- Attempted wean, switched back to full support

## 2018-02-26 NOTE — Progress Notes (Addendum)
ANTICOAGULATION CONSULT NOTE - Follow Up Consult  Pharmacy Consult for IV Heparin Indication: atrial fibrillation  Allergies  Allergen Reactions  . Codeine Nausea And Vomiting  . Sulfa Antibiotics Nausea And Vomiting  . Sulfur Nausea And Vomiting    Patient Measurements: Height: 5\' 5"  (165.1 cm) Weight: 191 lb 12.8 oz (87 kg) IBW/kg (Calculated) : 57 Heparin Dosing Weight: 79 kg  Vital Signs: Temp: 98.2 F (36.8 C) (01/22 0700) Temp Source: Bladder (01/21 2000) BP: 118/50 (01/22 0345) Pulse Rate: 89 (01/22 0700)  Labs: Recent Labs    02/10/2018 2113 02/25/18 0300 02/25/18 0515 02/25/18 0820 02/25/18 1502 02/25/18 1634 02/25/18 1758 02/25/18 2015 02/26/18 0526  HGB 11.8*  --  12.5  --   --   --   --   --  12.6  HCT 39.8  --  40.5  --   --   --   --   --  40.1  PLT 147*  --  160  --   --   --   --   --  137*  APTT 42*  --   --   --  192* >200* 124* 61* 95*  HEPARINUNFRC  --   --   --   --  >2.20* >2.20*  --   --  >2.20*  CREATININE 6.14*  --  4.92*  --  4.01*  --   --   --  3.31*  TROPONINI 0.52* 0.70*  --  1.09*  --   --   --   --   --     Estimated Creatinine Clearance: 13.5 mL/min (A) (by C-G formula based on SCr of 3.31 mg/dL (H)).   Medications:  Infusions:  . sodium chloride 10 mL/hr at 02/26/18 0700  . dextrose 20 mL/hr at 02/26/18 0700  . fentaNYL infusion INTRAVENOUS 100 mcg/hr (02/26/18 0700)  . heparin 750 Units/hr (02/26/18 0700)  . norepinephrine (LEVOPHED) Adult infusion 10 mcg/min (02/26/18 0700)  . phenylephrine (NEO-SYNEPHRINE) Adult infusion 200 mcg/min (02/26/18 0700)  . piperacillin-tazobactam (ZOSYN)  IV Stopped (02/26/18 0532)  . prismasol BGK 0/2.5 1,000 mL/hr at 02/26/18 0457  . sodium bicarbonate (isotonic) 1000 mL infusion 100 mL/hr at 02/26/18 0015  . sodium bicarbonate (isotonic) 1000 mL infusion 100 mL/hr at 02/26/18 0010  . sodium chloride 999 mL/hr at 02/27/2018 2306  . vasopressin (PITRESSIN) infusion - *FOR SHOCK* 0.04 Units/min  (02/26/18 0700)    Assessment: 85 year old female transitioned from oral Eliquis to IV Heparin.   Night shift RN noted appearance of blood in stool when cleaning up patient after supra-therapeutic aPTT. RN contacted MD to determine plan - [place] with resuming IV Heparin now that aPTT down. Current aPTT within range. No current bleeding per RN.  H/H WNL, Plt trending down 160>137.   Goal of Therapy:  Heparin level 0.3-0.7 units/ml aPTT 66-102 seconds Monitor platelets by anticoagulation protocol: Yes   Plan:  Continue Heparin 750 units/hr  Recheck confirmatory aPTT at 1330   Monitor CBC and for any signs of bleeding Monitor daily aPTT and HL until correlating.   Carlyon Shadow, PharmD Candidate  02/26/2018,7:54 AM

## 2018-02-26 NOTE — Progress Notes (Signed)
Patient ID: [name] [name], female   DOB: 1930-07-14, 85 y.o.   MRN: [id number]   [facility] Progress Note   Assessment/ Plan:   1. Acute kidney Injury on chronic kidney disease stage IV-V: Anuric and suspected to have likely progressed on to ESRD.  Etiology appears to be likely ATN associated with sepsis vs hemodynamically mediated.  She remains pressor dependent and without evidence of renal recovery-continue CRRT at this time for regulation of metabolic abnormalities and begin attempt at ultrafiltration 50 cc/h. 2.  Severe metabolic acidosis: From acute kidney injury/lactic acidosis from hypoperfusion.  Corrected with CRRT-we will discontinue supplemental bicarbonate and adjust dialysate as indicated. 3.  Acute hypoxic respiratory failure: Remains on ventilator support.  Will begin ultrafiltration with CRRT. 4.  Septic versus cardiogenic shock: Source identification pending, remains on pressors and broad-spectrum antibiotics.  Subjective:   Intermittently tachycardic overnight, being transitioned from Levophed to Neo-Synephrine.   Objective:   BP (!) 118/50   Pulse 89   Temp 98.2 F (36.8 C)   Resp (!) 22   Ht 5' 5"  (1.651 m)   Wt 87 kg   SpO2 99%   BMI 31.92 kg/m   Intake/Output Summary (Last 24 hours) at 02/26/2018 8115 Last data filed at 02/26/2018 0700 Gross per 24 hour  Intake 5851.76 ml  Output 5339 ml  Net 512.76 ml   Weight change: 3.5 kg  Physical Exam: Gen: Intubated, sedated CVS: Regular tachycardia, S1 and S2 normal Resp: Anteriorly clear to auscultation, no distinct rales or rhonchi Abd: Soft, obese, nontender Ext: 1+ edema over dependent areas/upper extremities  Imaging: US Renal  Result Date: 02/25/2018 CLINICAL DATA:  Acute renal disease EXAM: RENAL / URINARY TRACT ULTRASOUND COMPLETE COMPARISON:  CT 06/19/2016 FINDINGS: Right Kidney: Renal measurements: 8.5 cm length by 4.4 cm height by 4.3 cm wide = volume: 83.1 mL. Cortical echogenicity  normal. Cyst in the midpole measuring 1.4 x 1.5 x 1.5 cm. Left Kidney: Renal measurements: 8.9 cm length by 4.7 cm height by 5.2 cm wide = volume: 114.4 mL. Echogenicity within normal limits. No mass or hydronephrosis visualized. Bladder: The bladder is empty IMPRESSION: 1. Negative for hydronephrosis. 2. Single cyst in the right kidney. Electronically Signed   By: Donavan Foil M.D.   On: 02/25/2018 01:20   Dg Chest Port 1 View  Result Date: 02/25/2018 CLINICAL DATA:  85 year old female respiratory failure. Subsequent encounter. EXAM: PORTABLE CHEST 1 VIEW COMPARISON:  02/23/2018 FINDINGS: Endotracheal tube tip 2.3 cm above the carina. Nasogastric tube courses below the diaphragm. Tip is not included on the present exam. Right central line tip proximal superior vena cava level. Cardiomegaly. Can not exclude pericardial effusion. Calcified aorta. Mid to lower lobe subsegmental atelectasis bilaterally. Left base infiltrate not excluded. Slight decrease in degree of pulmonary vascular congestion (most notable centrally). IMPRESSION: 1. Slight decrease in degree of pulmonary vascular congestion. 2. Left base atelectasis versus infiltrate. 3. Subsegmental atelectasis mid to lower lobe bilaterally. 4. Prominent cardiomegaly. 5.  Aortic Atherosclerosis (ICD10-I70.0). 6. Nasogastric tube placed. Tip not included on present exam. Endotracheal tube and right central line once again noted. Electronically Signed   By: Genia Del M.D.   On: 02/25/2018 06:25   Portable Chest X-ray  Result Date: 02/15/2018 CLINICAL DATA:  Respiratory failure EXAM: PORTABLE CHEST 1 VIEW COMPARISON:  02/11/2018 FINDINGS: Cardiac shadow remains enlarged. Endotracheal tube is now seen in satisfactory position 2 cm above the carina. Central venous catheter is noted on the right extending to the  proximal superior vena cava. Mild vascular congestion is noted. No focal confluent infiltrate is seen. No pneumothorax is noted. IMPRESSION:  Endotracheal tube in satisfactory position. Right jugular central line noted in the proximal superior vena cava. Mild vascular congestion without focal infiltrate. Electronically Signed   By: Inez Catalina M.D.   On: 02/08/2018 21:50   Dg Chest Port 1 View  Result Date: 02/26/2018 CLINICAL DATA:  Shortness of breath and cough today. EXAM: PORTABLE CHEST 1 VIEW COMPARISON:  PA and lateral chest 10/23/2017 and 06/01/2017. FINDINGS: There is marked cardiomegaly. Pulmonary edema is present. No pneumothorax or pleural effusion. Aortic atherosclerosis is noted. IMPRESSION: Cardiomegaly and pulmonary edema. Atherosclerosis Electronically Signed   By: Inge Rise M.D.   On: 02/28/2018 17:06    Labs: BMET Recent Labs  Lab 03/07/2018 1507 02/06/2018 1708 02/22/2018 2113 02/25/18 0515 02/25/18 1502 02/26/18 0526  NA 137 139 138 138 135 133*  K 6.9* 7.2* 6.0* 4.2 4.2 3.6  CL 109 110 110 102 94* 88*  CO2 8* 8* 10* 17* 20* 22  GLUCOSE 182* 165* 273* 71 141* 181*  BUN 445* 104* 111* 87* 65* 52*  CREATININE 6.03* 5.95* 6.14* 4.92* 4.01* 3.31*  CALCIUM 8.5* 8.6* 7.9* 7.6* 6.8* 6.5*  PHOS  --   --   --  5.4* 4.4 4.5   CBC Recent Labs  Lab 02/12/2018 1507 02/18/2018 2113 02/25/18 0515 02/26/18 0526  WBC 27.5* 25.2* 25.4* 15.6*  NEUTROABS 23.3*  --   --   --   HGB 12.0 11.8* 12.5 12.6  HCT 42.5 39.8 40.5 40.1  MCV 103.2* 98.0 93.3 94.1  PLT 159 147* 160 137*    Medications:    . chlorhexidine gluconate (MEDLINE KIT)  15 mL Mouth Rinse BID  . Chlorhexidine Gluconate Cloth  6 each Topical Q0600  . [START ON 03/03/2018] Chlorhexidine Gluconate Cloth  6 each Topical Daily  . feeding supplement (PRO-STAT SUGAR FREE 64)  30 mL Per Tube BID  . feeding supplement (VITAL HIGH PROTEIN)  1,000 mL Per Tube Q24H  . fentaNYL (SUBLIMAZE) injection  50 mcg Intravenous Once  . insulin aspart  0-9 Units Subcutaneous Q4H  . mouth rinse  15 mL Mouth Rinse 10 times per day  . mupirocin ointment  1 application  Nasal BID  . pantoprazole (PROTONIX) IV  40 mg Intravenous Daily  . sodium chloride flush  10-40 mL Intracatheter Q12H   Elmarie Shiley, MD 02/26/2018, 7:37 AM

## 2018-02-26 NOTE — Progress Notes (Addendum)
ANTICOAGULATION CONSULT NOTE - Follow Up Consult  Pharmacy Consult for IV Heparin Indication: atrial fibrillation  Allergies  Allergen Reactions  . Codeine Nausea And Vomiting  . Sulfa Antibiotics Nausea And Vomiting  . Sulfur Nausea And Vomiting    Patient Measurements: Height: 5\' 5"  (165.1 cm) Weight: 191 lb 12.8 oz (87 kg) IBW/kg (Calculated) : 57 Heparin Dosing Weight: 79 kg  Vital Signs: Temp: 97.3 F (36.3 C) (01/22 2230) BP: 89/69 (01/22 2051) Pulse Rate: 65 (01/22 2230)  Labs: Recent Labs    02/17/2018 2113 02/25/18 0300 02/25/18 0515 02/25/18 0820 02/25/18 1502 02/25/18 1634  02/26/18 0526 02/26/18 1353 02/26/18 1600 02/26/18 2142  HGB 11.8*  --  12.5  --   --   --   --  12.6  --   --   --   HCT 39.8  --  40.5  --   --   --   --  40.1  --   --   --   PLT 147*  --  160  --   --   --   --  137*  --   --   --   APTT 42*  --   --   --  192* >200*   < > 95* 115*  --  91*  HEPARINUNFRC  --   --   --   --  >2.20* >2.20*  --  >2.20*  --   --   --   CREATININE 6.14*  --  4.92*  --  4.01*  --   --  3.31*  --  2.65*  --   TROPONINI 0.52* 0.70*  --  1.09*  --   --   --   --   --   --   --    < > = values in this interval not displayed.    Estimated Creatinine Clearance: 16.9 mL/min (A) (by C-G formula based on SCr of 2.65 mg/dL (H)).   Medications:  Infusions:  . sodium chloride 10 mL/hr at 02/26/18 2200  . dextrose Stopped (02/26/18 0757)  . fentaNYL infusion INTRAVENOUS 250 mcg/hr (02/26/18 2200)  . heparin 600 Units/hr (02/26/18 2200)  . norepinephrine (LEVOPHED) Adult infusion 8 mcg/min (02/26/18 2200)  . phenylephrine (NEO-SYNEPHRINE) Adult infusion Stopped (02/26/18 1147)  . piperacillin-tazobactam Stopped (02/26/18 2041)  . prismasol BGK 4/2.5 1,500 mL/hr at 02/26/18 2033  . sodium bicarbonate (isotonic) 1000 mL infusion 200 mL/hr at 02/26/18 1316  . sodium bicarbonate (isotonic) 1000 mL infusion 200 mL/hr at 02/26/18 2141  . sodium chloride 999 mL/hr  at 02/28/2018 2306  . [START ON 02/27/2018] vancomycin    . vasopressin (PITRESSIN) infusion - *FOR SHOCK* 0.04 Units/min (02/26/18 2200)    Assessment: 85 year old female transitioned from oral Eliquis to IV Heparin. Night shift RN noted appearance of blood in stool 1/21 while cleaning up patient after supra-therapeutic aPTT.RN contacted MD to determine plan who was okay with resuming IV heparin now that aPTT was down. Heparin rate was decreased to 750 units / hr at that time.  APTT now down to subtherapeutic (51 sec) on gtt at 600 units/hr. RN reports no issues with infusion, small amount of bloody vaginal discharge. Heparin level remains elevated due to Eliquis. Plt down to 119.  Goal of Therapy:  Heparin level 0.3-0.7 units/ml aPTT 66-102 seconds Monitor platelets by anticoagulation protocol: Yes   Plan:  Increase Heparin to 700 units/hr  F/u 8 hr PTT Monitor daily aPTT and HL until correlating.  Thank you for allowing pharmacy to be a part of this patient's care.  Sherlon Handing, PharmD, BCPS Clinical pharmacist  **Pharmacist phone directory can now be found on [email or web address] ([email or web address]).  Listed under [place]. 02/26/2018  10:57 PM

## 2018-02-27 ENCOUNTER — Encounter (HOSPITAL_COMMUNITY): Payer: Self-pay | Admitting: Physician Assistant

## 2018-02-27 DIAGNOSIS — I4819 Other persistent atrial fibrillation: Secondary | ICD-10-CM

## 2018-02-27 LAB — RENAL FUNCTION PANEL
Albumin: 2.6 g/dL — ABNORMAL LOW (ref 3.5–5.0)
Albumin: 2.6 g/dL — ABNORMAL LOW (ref 3.5–5.0)
Anion gap: 14 (ref 5–15)
Anion gap: 14 (ref 5–15)
BUN: 39 mg/dL — ABNORMAL HIGH (ref 8–23)
BUN: 36 mg/dL — ABNORMAL HIGH (ref 8–23)
CO2: 35 mmol/L — ABNORMAL HIGH (ref 22–32)
CO2: 29 mmol/L (ref 22–32)
Calcium: 6.4 mg/dL — CL (ref 8.9–10.3)
Calcium: 7.2 mg/dL — ABNORMAL LOW (ref 8.9–10.3)
Chloride: 85 mmol/L — ABNORMAL LOW (ref 98–111)
Chloride: 90 mmol/L — ABNORMAL LOW (ref 98–111)
Creatinine, Ser: 2.07 mg/dL — ABNORMAL HIGH (ref 0.44–1.00)
Creatinine, Ser: 1.98 mg/dL — ABNORMAL HIGH (ref 0.44–1.00)
GFR calc Af Amer: 25 mL/min — ABNORMAL LOW (ref >60)
GFR calc Af Amer: 26 mL/min — ABNORMAL LOW (ref >60)
GFR calc non Af Amer: 21 mL/min — ABNORMAL LOW (ref >60)
GFR calc non Af Amer: 22 mL/min — ABNORMAL LOW (ref >60)
Glucose, Bld: 140 mg/dL — ABNORMAL HIGH (ref 70–99)
Glucose, Bld: 195 mg/dL — ABNORMAL HIGH (ref 70–99)
Phosphorus: 1.8 mg/dL — ABNORMAL LOW (ref 2.5–4.6)
Phosphorus: 3.4 mg/dL (ref 2.5–4.6)
Potassium: 3.8 mmol/L (ref 3.5–5.1)
Potassium: 4 mmol/L (ref 3.5–5.1)
Sodium: 134 mmol/L — ABNORMAL LOW (ref 135–145)
Sodium: 133 mmol/L — ABNORMAL LOW (ref 135–145)

## 2018-02-27 LAB — CBC
HCT: 40.1 % (ref 36.0–46.0)
Hemoglobin: 12.2 g/dL (ref 12.0–15.0)
MCH: 28.5 pg (ref 26.0–34.0)
MCHC: 30.4 g/dL (ref 30.0–36.0)
MCV: 93.7 fL (ref 80.0–100.0)
Platelets: 119 10*3/uL — ABNORMAL LOW (ref 150–400)
RBC: 4.28 MIL/uL (ref 3.87–5.11)
RDW: 18.8 % — ABNORMAL HIGH (ref 11.5–15.5)
WBC: 12.6 10*3/uL — ABNORMAL HIGH (ref 4.0–10.5)
nRBC: 14.7 % — ABNORMAL HIGH (ref 0.0–0.2)

## 2018-02-27 LAB — CULTURE, RESPIRATORY

## 2018-02-27 LAB — CBG MONITORING, ED: Glucose-Capillary: 27 mg/dL — CL (ref 70–99)

## 2018-02-27 LAB — GLUCOSE, CAPILLARY
Glucose-Capillary: 107 mg/dL — ABNORMAL HIGH (ref 70–99)
Glucose-Capillary: 113 mg/dL — ABNORMAL HIGH (ref 70–99)
Glucose-Capillary: 130 mg/dL — ABNORMAL HIGH (ref 70–99)
Glucose-Capillary: 132 mg/dL — ABNORMAL HIGH (ref 70–99)
Glucose-Capillary: 133 mg/dL — ABNORMAL HIGH (ref 70–99)
Glucose-Capillary: 155 mg/dL — ABNORMAL HIGH (ref 70–99)
Glucose-Capillary: 168 mg/dL — ABNORMAL HIGH (ref 70–99)
Glucose-Capillary: 248 mg/dL — ABNORMAL HIGH (ref 70–99)

## 2018-02-27 LAB — CULTURE, RESPIRATORY W GRAM STAIN

## 2018-02-27 LAB — MAGNESIUM: Magnesium: 1.8 mg/dL (ref 1.7–2.4)

## 2018-02-27 LAB — APTT
aPTT: 51 seconds — ABNORMAL HIGH (ref 24–36)
aPTT: 38 seconds — ABNORMAL HIGH (ref 24–36)

## 2018-02-27 LAB — HEPARIN LEVEL (UNFRACTIONATED): Heparin Unfractionated: 1.42 IU/mL — ABNORMAL HIGH (ref 0.30–0.70)

## 2018-02-27 MED ORDER — PRISMASOL BGK 4/2.5 32-4-2.5 MEQ/L REPLACEMENT SOLN
Status: DC
  Administered 2018-02-27 – 2018-03-01 (×5): via INTRAVENOUS_CENTRAL
  Filled 2018-02-27 (×7): qty 5000

## 2018-02-27 MED ORDER — CALCIUM GLUCONATE 10 % IV SOLN: 2.0000 g | Freq: Once | INTRAVENOUS | Status: DC

## 2018-02-27 MED ORDER — VITAL HIGH PROTEIN PO LIQD
1000.0000 mL | ORAL | Status: DC
  Administered 2018-02-28 – 2018-03-03 (×5): 1000 mL
  Filled 2018-02-27: qty 1000

## 2018-02-27 MED ORDER — CALCIUM GLUCONATE-NACL 2-0.675 GM/100ML-% IV SOLN
2.0000 g | Freq: Once | INTRAVENOUS | Status: AC
  Administered 2018-02-27: 2000 mg via INTRAVENOUS
  Filled 2018-02-27: qty 100

## 2018-02-27 MED ORDER — PRISMASOL BGK 4/2.5 32-4-2.5 MEQ/L REPLACEMENT SOLN
Status: DC
  Administered 2018-02-27 – 2018-03-01 (×4): via INTRAVENOUS_CENTRAL
  Filled 2018-02-27 (×7): qty 5000

## 2018-02-27 MED ORDER — CEFAZOLIN SODIUM-DEXTROSE 2-4 GM/100ML-% IV SOLN
2.0000 g | Freq: Two times a day (BID) | INTRAVENOUS | Status: DC
  Administered 2018-02-27 – 2018-03-01 (×5): 2 g via INTRAVENOUS
  Filled 2018-02-27 (×5): qty 100

## 2018-02-27 MED ORDER — SODIUM PHOSPHATES 45 MMOLE/15ML IV SOLN
30.0000 mmol | Freq: Once | INTRAVENOUS | Status: AC
  Administered 2018-02-27: 30 mmol via INTRAVENOUS
  Filled 2018-02-27: qty 10

## 2018-02-27 MED ORDER — DIGOXIN 0.25 MG/ML IJ SOLN: 0.0625 mg | INTRAMUSCULAR | Status: DC

## 2018-02-27 MED ORDER — VANCOMYCIN HCL IN DEXTROSE 1-5 GM/200ML-% IV SOLN
1000.0000 mg | INTRAVENOUS | Status: DC
  Administered 2018-02-27 – 2018-02-28 (×2): 1000 mg via INTRAVENOUS
  Filled 2018-02-27 (×3): qty 200

## 2018-02-27 NOTE — Consult Note (Addendum)
Cardiology Consultation:   Patient ID: [name] [name]; [id number]; 10/01/1930   Admit date: 03/04/2018 Date of Consult: 02/27/2018  Primary Care Provider: Vidal Schwalbe, MD Primary Cardiologist: Yolonda Kida, MD Primary Electrophysiologist:  None  Chief Complaint: SOB, weakness  Patient Profile:   [name] [name] is a 85 y.o. female with a hx of cardiomyopathy (etiology not defined), atrial fib (listed as paroxysmal but may be chronic), chronic anticoagulation with Eliquis, CKD IV, prior GI bleeding, DM, HTN, HLD, thrombocytopenia, LEE who is being seen today for the evaluation of rapid atrial fib and decreased EF at the request of Dr. Lynetta Mare.  History of Present Illness:   [name] has a history of cardiomyopathy going back to echo in 2018 with EF 45-50%, etiology not defined. Cardiologist note from [place] does not list any prior ischemic evaluation, does indicate history of chronic diastolic CHF requiring diuretics. She also carries a history of atrial fib listed as paroxysmal in [place] notes with RRR documented in exam, but EKG not recorded in OV notes. All EKGs in our system back to 2013 show persistent atrial fib. She presented to [facility] upon transfer from [facility] with SOB, cough, and weakness with numerous metabolic derangements. History obtained from chart and nurse as patient is intubated and no family present. Upon arrival to the hospital she was found to have AKI on CKD with oliguria with BUN 445, Cr 6.03, hyperkalemia with K of 7.2, BNP 2594, marked leukocytosis of 27.5, and shock liver. ABG demonstrated metabolic and respiratory acidosis. On arrival to come she had kussmaul respirations and hypotension/shock requiring numerous pressors and eventual intubation. Tracheal aspirate positive for MRSA and Klebsiella so she is being treated with vancomycin and ancef. She also has required CRRT and renal is concerned that she will require transition to permanent HD for ESRD.  She has also been in AF with frequent RVR since admission. On 1/21 she was trialed on amiodarone drip but became severely hypotensive and had to be placed in trandelenburg position and we had to increase her pressor support. Instead she received 0.65m then 0.239mIV of IV digoxin yesterday, then 0.2532moday. Her Eliquis has been paused and she is on heparin. With the above supportive measures her HR has improved to 90s-low 100s. BP is now 120s/40s off pressor support. She remains intubated. 2D echo 02/25/18 showed severe LV dysfunction with EF 20-25%, incoordinate septal motion, inferior and lateral severe hypokinesis to akinesis, moderate MR, RV severely dilated with moderate-severe hypokinesis, moderate TR, elevated CVP, moderate TR. Cr is down to 1.98 today, albumin 2.6, WBC 12.6, plt count 119. Troponins were 0.34->0.52->0.70->1.09  Past Medical History:  Diagnosis Date  . Acid reflux   . Atrial fibrillation ([place] . Cardiomyopathy ([place]  a. Prior EF 45% in prior echos.  . Chronic anticoagulation   . Chronic combined systolic and diastolic CHF (congestive heart failure) ([facility] . CKD (chronic kidney disease), stage IV ([facility] . Diabetes mellitus type 2 in obese ([place] . Diverticular hemorrhage 2011   Per colonoscopy  . Diverticulosis 2011   Per colonoscopy  . GI bleeding 08/03/2011   Not scoped but likely secondary to hemorrhoids or diverticulosis  . Gout   . Hx of medication noncompliance   . Hypercholesterolemia   . Hyperglycemia    Query DM 2  . Hypertension   . Internal hemorrhoids 2011   Per colonoscopy  . Lower extremity edema   .  Seasonal allergies   . Thrombocytopenia due to drugs    Allopurinol    Past Surgical History:  Procedure Laterality Date  . ADENOIDECTOMY    . APPENDECTOMY    . BUNIONECTOMY    . CATARACT EXTRACTION    . CESAREAN SECTION    . ESOPHAGOGASTRODUODENOSCOPY  08/20/09   small hiatal hernia/mild gastritis  . ileocolonoscopy  08/20/09   normal  terminal ileum/pancolonic diverticula/no polyps/benign colon mucosa  . JOINT REPLACEMENT     Total right knee 2007  . LAPAROTOMY  09/22/2010   Procedure: EXPLORATORY LAPAROTOMY;  Surgeon: [name];  Location: [facility];  Service: General;  Laterality: N/A;  Lysis of Adhesions  . TONSILLECTOMY    . TUBAL LIGATION       Inpatient Medications: Scheduled Meds: . calcium carbonate (dosed in mg elemental calcium)  1,000 mg of elemental calcium Per Tube TID  . chlorhexidine gluconate (MEDLINE KIT)  15 mL Mouth Rinse BID  . Chlorhexidine Gluconate Cloth  6 each Topical Q0600  . [START ON 03/03/2018] Chlorhexidine Gluconate Cloth  6 each Topical Daily  . [START ON 03/01/2018] digoxin  0.0625 mg Intravenous Q48H  . feeding supplement (PRO-STAT SUGAR FREE 64)  30 mL Per Tube BID  . [START ON 02/28/2018] feeding supplement (VITAL HIGH PROTEIN)  1,000 mL Per Tube Q24H  . fentaNYL (SUBLIMAZE) injection  50 mcg Intravenous Once  . insulin aspart  0-9 Units Subcutaneous Q4H  . mouth rinse  15 mL Mouth Rinse 10 times per day  . mupirocin ointment  1 application Nasal BID  . pantoprazole (PROTONIX) IV  40 mg Intravenous Daily  . sodium chloride flush  10-40 mL Intracatheter Q12H   Continuous Infusions: .  prismasol BGK 4/2.5 400 mL/hr at 02/27/18 0806  .  prismasol BGK 4/2.5 400 mL/hr at 02/27/18 0806  . sodium chloride 10 mL/hr at 02/27/18 1700  .  ceFAZolin (ANCEF) IV Stopped (02/27/18 1222)  . fentaNYL infusion INTRAVENOUS Stopped (02/27/18 1023)  . heparin 850 Units/hr (02/27/18 1700)  . norepinephrine (LEVOPHED) Adult infusion Stopped (02/27/18 0915)  . phenylephrine (NEO-SYNEPHRINE) Adult infusion Stopped (02/27/18 1100)  . prismasol BGK 4/2.5 1,500 mL/hr at 02/27/18 1709  . sodium chloride 999 mL/hr at 02/05/2018 2306  . vancomycin Stopped (02/27/18 1510)  . vasopressin (PITRESSIN) infusion - *FOR SHOCK* Stopped (02/27/18 0836)   PRN Meds: fentaNYL, heparin, midazolam, midazolam, sodium  chloride, sodium chloride flush  Home Meds: Prior to Admission medications   Medication Sig Start Date End Date Taking? Authorizing Provider  acetaminophen (TYLENOL) 325 MG tablet Take 2 tablets (650 mg total) by mouth every 4 (four) hours as needed for headache or mild pain. 10/27/17  Yes Emokpae, Courage, MD  allopurinol (ZYLOPRIM) 100 MG tablet Take 100 mg by mouth daily.   Yes [provider]  amitriptyline (ELAVIL) 25 MG tablet Take 25 mg by mouth at bedtime as needed for sleep.  02/14/18  Yes [provider]  apixaban (ELIQUIS) 2.5 MG TABS tablet Take 1 tablet (2.5 mg total) by mouth 2 (two) times daily. 10/27/17  Yes Emokpae, Courage, MD  Artificial Tear Ointment (DRY EYES OP) Place 1 drop into both eyes as needed (dry eyes).   Yes [provider]  ferrous sulfate 325 (65 FE) MG tablet Take 325 mg by mouth 2 (two) times daily with a meal.    Yes [provider]  fluticasone (FLONASE) 50 MCG/ACT nasal spray Place 1 spray into both nostrils daily.   Yes [provider]  furosemide (LASIX) 40 MG tablet Take 1 tablet (40 mg total) by mouth daily. Patient taking differently: Take 20 mg by mouth daily.  10/27/17  Yes Emokpae, Courage, MD  glimepiride (AMARYL) 2 MG tablet Take 1 tablet (2 mg total) by mouth daily with breakfast. 10/28/17  Yes Emokpae, Courage, MD  levalbuterol (XOPENEX) 1.25 MG/3ML nebulizer solution Take 1.25 mg by nebulization every 4 (four) hours as needed for wheezing. 09/10/17  Yes Valentina Shaggy, MD  loratadine (CLARITIN) 10 MG tablet Take 10 mg by mouth daily.   Yes [provider]  metoprolol succinate (TOPROL-XL) 25 MG 24 hr tablet Take 25 mg by mouth every morning.   Yes [provider]  montelukast (SINGULAIR) 10 MG tablet Take 10 mg by mouth at bedtime.    Yes [provider]  Multiple Vitamin (MULTIVITAMIN WITH MINERALS) TABS tablet Take 1 tablet by mouth daily.   Yes [provider]    omeprazole (PRILOSEC) 20 MG capsule Take 1 capsule (20 mg total) by mouth daily. Patient taking differently: Take 20 mg by mouth every morning.  06/19/16  Yes Maczis, Barth Kirks, PA-C  potassium chloride SA (K-DUR,KLOR-CON) 20 MEQ tablet Take 1 tablet (20 mEq total) by mouth daily. 04/01/16  Yes Isaac Bliss, Rayford Halsted, MD  pravastatin (PRAVACHOL) 40 MG tablet Take 40 mg by mouth every evening.    Yes [provider]  sodium chloride (OCEAN) 0.65 % SOLN nasal spray Place 1 spray into both nostrils as needed for congestion.   Yes [provider]  Vitamin D, Ergocalciferol, (DRISDOL) 50000 units CAPS capsule Take 50,000 Units by mouth every 30 (thirty) days. Administered on the 1st of each month   Yes [provider]    Allergies:    Allergies  Allergen Reactions  . Codeine Nausea And Vomiting  . Sulfa Antibiotics Nausea And Vomiting  . Sulfur Nausea And Vomiting    Social History:   Social History   Socioeconomic History  . Marital status: Widowed    Spouse name: Not on file  . Number of children: Not on file  . Years of education: Not on file  . Highest education level: Not on file  Occupational History  . Not on file  Social Needs  . Financial resource strain: Not on file  . Food insecurity:    Worry: Not on file    Inability: Not on file  . Transportation needs:    Medical: Not on file    Non-medical: Not on file  Tobacco Use  . Smoking status: Never Smoker  . Smokeless tobacco: Never Used  Substance and Sexual Activity  . Alcohol use: No  . Drug use: No  . Sexual activity: Not Currently  Lifestyle  . Physical activity:    Days per week: Not on file    Minutes per session: Not on file  . Stress: Not on file  Relationships  . Social connections:    Talks on phone: Not on file    Gets together: Not on file    Attends religious service: Not on file    Active member of club or organization: Not on file    Attends meetings of clubs or  organizations: Not on file    Relationship status: Not on file  . Intimate partner violence:    Fear of current or ex partner: Not on file    Emotionally abused: Not on file    Physically abused: Not on file  Forced sexual activity: Not on file  Other Topics Concern  . Not on file  Social History Narrative  . Not on file    Family History:   The patient's family history includes Diabetes in her mother; Heart disease in her mother.  ROS:  Unable to obtain as patient is intubated/sedated  Physical Exam/Data:   Vitals:   02/27/18 1600 02/27/18 1614 02/27/18 1630 02/27/18 1700  BP:      Pulse: 85 79 80 87  Resp: (!) 22 (!) 23 20 (!) 22  Temp: (!) 97.5 F (36.4 C)   97.7 F (36.5 C)  TempSrc:      SpO2: 99% 100% 100% 100%  Weight:      Height:        Intake/Output Summary (Last 24 hours) at 02/27/2018 1715 Last data filed at 02/27/2018 1700 Gross per 24 hour  Intake 3576 ml  Output 5104 ml  Net -1528 ml   Last 3 Weights 02/27/2018 02/26/2018 02/25/2018  Weight (lbs) 188 lb 15 oz 191 lb 12.8 oz 192 lb 0.3 oz  Weight (kg) 85.7 kg 87 kg 87.1 kg    Body mass index is 31.44 kg/m.  General: Well developed, well nourished obese AAF appears chronically ill Head: Normocephalic, atraumatic, sclera non-icteric, no xanthomas, nares are without discharge.  Neck:  Lines prevent adequate eval of JVP Lungs: Coarse bilaterally to auscultation without wheezes, rales, or rhonchi. Breathing is unlabored. Heart: Irregularly irregular, rapid, with S1 S2. No murmurs, rubs, or gallops appreciated. Abdomen: Soft, non-tender, non-distended with normoactive bowel sounds. No hepatomegaly. No rebound/guarding. No obvious abdominal masses. Msk:  Strength and tone appear normal for age. Extremities: No clubbing or cyanosis. Trace-1+ BLE edema superimposed on baseline obesity. Distal pedal pulses are 2+ and equal bilaterally. Neuro: Intubated, seems to respond to coldness of my hand but does not  answer questions Psych: unable to assess given intubation  EKG:  The EKG was personally reviewed and demonstrates atrial fib with RVR with IVCD/age undetermined anterolateral infarct and inferior TWI  Laboratory Data:  Chemistry Recent Labs  Lab 02/26/18 1600 02/27/18 0431 02/27/18 1500  NA 133* 134* 133*  K 3.7 3.8 4.0  CL 84* 85* 90*  CO2 29 35* 29  GLUCOSE 169* 140* 195*  BUN 44* 39* 36*  CREATININE 2.65* 2.07* 1.98*  CALCIUM 6.3* 6.4* 7.2*  GFRNONAA 16* 21* 22*  GFRAA 18* 25* 26*  ANIONGAP 20* 14 14    Recent Labs  Lab 02/05/2018 2113 02/25/18 0515  02/26/18 0526 02/26/18 1600 02/27/18 0431 02/27/18 1500  PROT 6.6 6.9  --  6.4*  --   --   --   ALBUMIN 3.4* 3.5   < > 2.9*  2.9* 2.9* 2.6* 2.6*  AST 972* 1,162*  --  1,005*  --   --   --   ALT 450* 597*  --  670*  --   --   --   ALKPHOS 87 94  --  90  --   --   --   BILITOT 2.5* 2.6*  --  3.1*  --   --   --    < > = values in this interval not displayed.   Hematology Recent Labs  Lab 02/25/18 0515 02/26/18 0526 02/27/18 0431  WBC 25.4* 15.6* 12.6*  RBC 4.34 4.26 4.28  HGB 12.5 12.6 12.2  HCT 40.5 40.1 40.1  MCV 93.3 94.1 93.7  MCH 28.8 29.6 28.5  MCHC 30.9 31.4 30.4  RDW 19.4*  19.8* 18.8*  PLT 160 137* 119*   Cardiac Enzymes Recent Labs  Lab 02/25/2018 1507 02/12/2018 2113 2018/03/04 0300 2018-03-04 0820  TROPONINI 0.32* 0.52* 0.70* 1.09*   No results for input(s): TROPIPOC in the last 168 hours.  BNP Recent Labs  Lab 02/10/2018 1458  BNP 2,594.0*    DDimer No results for input(s): DDIMER in the last 168 hours.  Radiology/Studies:  US Renal  Result Date: 2018-03-04 CLINICAL DATA:  Acute renal disease EXAM: RENAL / URINARY TRACT ULTRASOUND COMPLETE COMPARISON:  CT 06/19/2016 FINDINGS: Right Kidney: Renal measurements: 8.5 cm length by 4.4 cm height by 4.3 cm wide = volume: 83.1 mL. Cortical echogenicity normal. Cyst in the midpole measuring 1.4 x 1.5 x 1.5 cm. Left Kidney: Renal measurements: 8.9 cm  length by 4.7 cm height by 5.2 cm wide = volume: 114.4 mL. Echogenicity within normal limits. No mass or hydronephrosis visualized. Bladder: The bladder is empty IMPRESSION: 1. Negative for hydronephrosis. 2. Single cyst in the right kidney. Electronically Signed   By: Donavan Foil M.D.   On: 2018-03-04 01:20   Dg Chest Port 1 View  Result Date: 04-Mar-2018 CLINICAL DATA:  85 year old female respiratory failure. Subsequent encounter. EXAM: PORTABLE CHEST 1 VIEW COMPARISON:  02/27/2018 FINDINGS: Endotracheal tube tip 2.3 cm above the carina. Nasogastric tube courses below the diaphragm. Tip is not included on the present exam. Right central line tip proximal superior vena cava level. Cardiomegaly. Can not exclude pericardial effusion. Calcified aorta. Mid to lower lobe subsegmental atelectasis bilaterally. Left base infiltrate not excluded. Slight decrease in degree of pulmonary vascular congestion (most notable centrally). IMPRESSION: 1. Slight decrease in degree of pulmonary vascular congestion. 2. Left base atelectasis versus infiltrate. 3. Subsegmental atelectasis mid to lower lobe bilaterally. 4. Prominent cardiomegaly. 5.  Aortic Atherosclerosis (ICD10-I70.0). 6. Nasogastric tube placed. Tip not included on present exam. Endotracheal tube and right central line once again noted. Electronically Signed   By: Genia Del M.D.   On: 04-Mar-2018 06:25   Portable Chest X-ray  Result Date: 02/09/2018 CLINICAL DATA:  Respiratory failure EXAM: PORTABLE CHEST 1 VIEW COMPARISON:  02/14/2018 FINDINGS: Cardiac shadow remains enlarged. Endotracheal tube is now seen in satisfactory position 2 cm above the carina. Central venous catheter is noted on the right extending to the proximal superior vena cava. Mild vascular congestion is noted. No focal confluent infiltrate is seen. No pneumothorax is noted. IMPRESSION: Endotracheal tube in satisfactory position. Right jugular central line noted in the proximal superior  vena cava. Mild vascular congestion without focal infiltrate. Electronically Signed   By: Inez Catalina M.D.   On: 02/08/2018 21:50   Dg Chest Port 1 View  Result Date: 02/19/2018 CLINICAL DATA:  Shortness of breath and cough today. EXAM: PORTABLE CHEST 1 VIEW COMPARISON:  PA and lateral chest 10/23/2017 and 06/01/2017. FINDINGS: There is marked cardiomegaly. Pulmonary edema is present. No pneumothorax or pleural effusion. Aortic atherosclerosis is noted. IMPRESSION: Cardiomegaly and pulmonary edema. Atherosclerosis Electronically Signed   By: Inge Rise M.D.   On: 02/09/2018 17:06    Assessment and Plan:   1. Acute on chronic kidney disease with oliguria - requiring CRRT.  2. Shock, question mixed septic and cardiogenic - fortunately able to be weaned off pressors earlier. On abx, +MRSA/Klebsiella in tracheal aspirate.  3. Atrial fibrillation - chronicity listed on outside records as paroxysmal, but EKGs in our system have only ever shown afib even back to 2013. May be permanent. Regardless, elevated rates have likely been driven  by acute metabolic illness. With supportive measures her HR is much better. I would be leery of digoxin in setting of renal failure. Per d/w Dr. Acie Fredrickson, will discontinue and follow HR. HR presently controlled with improvement in metabolic stressors. Hopefully can add BB in the next 24 hours. Continue heparin while intubated. CHADSVASC 7 for CHF, HTN, age x2, diabetes, vascular disease (aortic atherosclerosis on CXR), and female sex, warranting longterm anticoagulation. Oral agent will depend on how her renal function goes. Would not make plans for DCCV during this admission given suspected chronicity of AF and likelihood of reversion back to AF in setting of acute illness.  4. Acute on chronic combined CHF (with volume retention complicated by #1) - progressive LV dysfunction on echocardiogram. EKG also suggests possibility of interim anterolateral infarct. At current  state would be poor candidate for invasive ischemic eval but this can be considered dependent on trajectory of her recovery. Would need to involve both patient and family in goals of care discussion as her advanced age and comorbidities may warrant conservative medical therapy instead. Hold off ACEI/ARB/ARNI/spiro given acute renal insufficiency. Hopefully can add BB in the next 24 hours.   5. Elevated troponin - may represent concomitant NSTEMI given EKG changes although may also be demand ischemia. Regardless, recommend supportive care for now. See above re: BB. No statin given shock liver. Per discussion with Dr. Acie Fredrickson, hold off on ASA given tenuous state but consider aspirin when OK with primary team. Sounds like neuro changes are still being evaluated by PCCM.  6. Thrombocytopenia - intermittently noted on prior labs as well. Need to follow on heparin.  Guarded prognosis given multiorgan failure this admission.  For questions or updates, please contact [place] Please consult [email or web address] for contact info under Cardiology/STEMI.    Signed, Charlie Pitter, PA-C  02/27/2018 5:15 PM   Attending Note:   The patient was seen and examined.  Agree with assessment and plan as noted above.  Changes made to the above note as needed.  Patient seen and independently examined with Melina Copa, PA .   We discussed all aspects of the encounter. I agree with the assessment and plan as stated above.  1.   Atrial fib:   Has had persistent AF for years.  Has been on Eliquis Was admitted to [facility] with pneumonia and sepsis syndrome and became very tachycardic.  Has acute renal failure. Acute on chronic CHF   Was loaded with Digoxin.  Transferred here. Currently , she seems to be improving  Is on CVVH Her AF rate seems to be fairly good at this point.  Would continue to support her for now and add additional meds as needed.  DC Digoxin  At this point .  2.   Acute on chronic CHF  : EF is now 25%.    Is likely due to sepsis syndrome.  ECG is concerning for recent ant. MI  Is a poor candidate for invasive cardiac procedures currently .  Continue supportive care .  Hope to be able to add low dose beta blockers in the future.  3.  Elevated troponin:   Low levels - most c/w demand ischemia in this acutely and severely ill patient.  Will hold off on ASA  Will hold off    I have spent a total of 40 minutes with patient reviewing hospital  notes , telemetry, EKGs, labs and examining patient as well as establishing an assessment and plan that was discussed with the  patient. > 50% of time was spent in direct patient care.    Thayer Headings, Brooke Bonito., MD, [facility] 02/27/2018, 5:55 PM [address]  [place] Pager [phone]

## 2018-02-27 NOTE — Progress Notes (Signed)
Patient ID: [name] [name], female   DOB: 04/06/1930, 85 y.o.   MRN: [id number]  [facility] Progress Note   Assessment/ Plan:   1. Acute kidney Injury on chronic kidney disease stage IV-V: Anuric and suspected to have likely progressed on to ESRD.  Acute component secondary to ATN likely from sepsis/hemodynamically mediated.  Continue CRRT at this time with plans to transition to intermittent hemodialysis when she gets off pressors. 2.  Metabolic alkalosis: From aggressive correction of severe metabolic acidosis, will modify CRRT prescription. 3.  Acute hypoxic respiratory failure: Remains on ventilator support.  Will begin ultrafiltration with CRRT. 4.  Septic versus cardiogenic shock: Source identification pending, remains on pressors and broad-spectrum antibiotics. 5.  Hypocalcemia, hypophosphatemia: Secondary to CRRT losses/prescription-will switch prefilter and post filter fluid and order for replacement phosphorus.  Subjective:   No significant events noted overnight, hypocalcemia noted on labs.   Objective:   BP 118/76 (BP Location: Left Arm)   Pulse 89   Temp 98.1 F (36.7 C)   Resp 18   Ht 5' 5"  (1.651 m)   Wt 85.7 kg   SpO2 99%   BMI 31.44 kg/m   Intake/Output Summary (Last 24 hours) at 02/27/2018 0737 Last data filed at 02/27/2018 0700 Gross per 24 hour  Intake 3828.64 ml  Output 4814 ml  Net -985.36 ml   Weight change: -1.3 kg  Physical Exam: Gen: Intubated, sedated CVS: Regular rhythm, normal rate, S1 and S2 normal Resp: Anteriorly clear to auscultation, no distinct rales or rhonchi Abd: Soft, obese, nontender Ext: Trace-1+ edema over dependent areas/upper extremities  Imaging: No results found.  Labs: BMET Recent Labs  Lab 02/08/2018 1708 02/21/2018 2113 02/25/18 0515 02/25/18 1502 02/26/18 0526 02/26/18 1600 02/27/18 0431  NA 139 138 138 135 133* 133* 134*  K 7.2* 6.0* 4.2 4.2 3.6 3.7 3.8  CL 110 110 102 94* 88* 84* 85*  CO2 8* 10*  17* 20* 22 29 35*  GLUCOSE 165* 273* 71 141* 181* 169* 140*  BUN 104* 111* 87* 65* 52* 44* 39*  CREATININE 5.95* 6.14* 4.92* 4.01* 3.31* 2.65* 2.07*  CALCIUM 8.6* 7.9* 7.6* 6.8* 6.5* 6.3* 6.4*  PHOS  --   --  5.4* 4.4 4.5 3.5 1.8*   CBC Recent Labs  Lab 02/06/2018 1507 02/06/2018 2113 02/25/18 0515 02/26/18 0526 02/27/18 0431  WBC 27.5* 25.2* 25.4* 15.6* 12.6*  NEUTROABS 23.3*  --   --   --   --   HGB 12.0 11.8* 12.5 12.6 12.2  HCT 42.5 39.8 40.5 40.1 40.1  MCV 103.2* 98.0 93.3 94.1 93.7  PLT 159 147* 160 137* 119*    Medications:    . calcium carbonate (dosed in mg elemental calcium)  1,000 mg of elemental calcium Per Tube TID  . chlorhexidine gluconate (MEDLINE KIT)  15 mL Mouth Rinse BID  . Chlorhexidine Gluconate Cloth  6 each Topical Q0600  . [START ON 03/03/2018] Chlorhexidine Gluconate Cloth  6 each Topical Daily  . digoxin  0.125 mg Intravenous Daily  . feeding supplement (PRO-STAT SUGAR FREE 64)  30 mL Per Tube BID  . feeding supplement (VITAL HIGH PROTEIN)  1,000 mL Per Tube Q24H  . fentaNYL (SUBLIMAZE) injection  50 mcg Intravenous Once  . insulin aspart  0-9 Units Subcutaneous Q4H  . mouth rinse  15 mL Mouth Rinse 10 times per day  . mupirocin ointment  1 application Nasal BID  . pantoprazole (PROTONIX) IV  40 mg Intravenous Daily  .  sodium chloride flush  10-40 mL Intracatheter Q12H   Elmarie Shiley, MD 02/27/2018, 7:37 AM

## 2018-02-27 NOTE — Progress Notes (Signed)
NAME:  [name] [name], MRN:  [id number], DOB:  07-Feb-1930, LOS: 3 ADMISSION DATE:  02/17/2018, CONSULTATION DATE:  03/01/2018 REFERRING MD:  Martin Majestic - [facility] ED  CHIEF COMPLAINT:  SOB   Brief History   This is an 85 year old female who presented to [facility] with SOB, cough, weakness and found to have multiple metabolic derangements including AoCKD with Scr 6, BUN 445, K 7.2. Received temporizing measures and transferred to [facility] for further management.   History of present illness   [name] [name] is a 85 y.o. female who has a PMH as outlined below (see "past medical history") including HTN, HLD, CHF, a fib, and CKD.  She presented to [facility] ED 1/20 with SOB, cough, weakness.  On EMS arrival, she was found to be hypoglycemic to around 50.  She received oral glucose and IM glucagon.  She was taken to ED where she remained with generalized weakness.  She was found to have multiple metabolic derangements including K 7.2, SCr 6.03, BUN 445 (165 on repeat), AG 20, BNP 2594, Trop 0.32, lactate 7.8, WBC 27.5.  ABG demonstrated metabolic + respiratory acidosis (7.0 / 38 / 42).  She was transferred to [facility] for further management with possibility of emergent HD.  After arrival to [facility], she was hypotensive with SBP in 70s.  Additionally, she had kussmaul respirations and although she was responsive, she was slow to respond.  She was started on BiPAP but later required intubation for continued respiratory insufficiency.  Past Medical History  HTN, HLD, CHF, A.fib, CKD, DM, diverticulosis, GERD.  Significant Hospital Events   1/20 >Admitted to ICU  Consults:  Nephrology  Procedures:  ETT 1/20 >  R IJ HD cath 1/20 >   Significant Diagnostic Tests:  CXR 1/20 > CM with pulmonary edema. Echo 1/20 >  Impressions:  - Compared to a prior study in 2019, the LVEF is further reduced to   20-25% with more severe RV hypokinesis, inferior and lateral LV   hypokinesis to akinesis and elevated biventricular filling    pressure.    Micro Data:  Blood 1/20 > NGTD Sputum 1/20 > Staphylococcus aureus and klebsiella pneumonia Urine 1/20 > <10K colonies, insignificant  Antimicrobials:  Zosyn 02/24/17  Interim history/subjective:  Overnight patient was noted to be hypocalcemic and was given calcium gluconate.  She was also started on digoxin yesterday and has been rate controlled on that.  She was still requiring some neo-and vasopressin this morning however later in the afternoon these had been stopped and she had been containing a good blood pressure.  Objective   Blood pressure 118/76, pulse 71, temperature 98.1 F (36.7 C), resp. rate (!) 22, height 5\' 5"  (1.651 m), weight 85.7 kg, SpO2 98 %. CVP:  [13 mmHg-15 mmHg] 15 mmHg  Vent Mode: PRVC FiO2 (%):  [40 %] 40 % Set Rate:  [18 bmp] 18 bmp Vt Set:  [450 mL] 450 mL PEEP:  [5 cmH20] 5 cmH20 Plateau Pressure:  [20 cmH20-28 cmH20] 20 cmH20   Intake/Output Summary (Last 24 hours) at 02/27/2018 8502 Last data filed at 02/27/2018 0600 Gross per 24 hour  Intake 3879.21 ml  Output 4842 ml  Net -962.79 ml   Filed Weights   02/25/18 0415 02/26/18 0300 02/27/18 0200  Weight: 87.1 kg 87 kg 85.7 kg    Examination: General: Intubated, sedated, critically ill appearing, awake, opens eyes to voice HENT: Central line in place, atraumatic, normocephalic Lungs: Bibasilar crackles, normal work of breathing Cardiovascular:  Tachycardia, no m/r/g Abdomen: Soft, non-tender, no distension Extremities: Trace BL LE edema, warm hands, warm knees Neuro: Sedated, awake, not following commands GU: No Foley in place  Resolved Hospital Problem list    Assessment & Plan:  Hypoxic respiratory distress 2/2 pulmonary edema Cardiogenic shock  Septic shock Leukocytosis: Metabolic acidosis: -Patient has been weaned off all of her vasopressors and is maintaining a map >65.  Her tracheal aspirate did show MRSA and Klebsiella however with rare WBCs. WBC has come down to  12.6 from 15.6 yesterday.  She is currently on Zosyn and vancomycin.  Likely did have a component of septic shock that has been improving.  Echocardiogram showed EF 20-25%, with severe RV hypokinesis, interior and lateral LV hypokinesis and elevated biventricular filling pressures.  -Patient has been having some improvement been able to be weaned off her pressors.  We will consult cardiology to see if they have any recommendations for her heart failure.  She has seen the Cardiologist Dr. Clayborn Bigness at [facility], last seen on 10/19 and her heart failure at that time showed an EF of around 45%. -Continue fentanyl and versed for sedation -Daily sedation vacation, daily SBT -Continue vancomycin  -Discontinue Zosyn -Start cefazolin -Continue neosyynephrine PRN -Continue levophed drip PRN -Discontinue vasopressin -Cardiology consult  AoCKD: Hyperkalemia: -Nephrology following and managing CRRT. They suspect that her CKD has progressed to ESRD, etiology is thought to be ATN. Patient remains anuric.  -Continue CRRT at this time, transition to intermittent hemodialysis  Transaminitis: Likely 2/2 shock liver. Today's labs are similar to yesterday. Continue to monitor.    Elevated troponin: -Troponin has trended upwards, 0.52 > 0.7 > 1.09 -Likely 2/2 demand ischemia  Diabetes mellitus: -Had started a D10 drip yesterday and trickle feeds due to hypoglycemia. Today her CBGs have remained elevated. Will discontinue D10 drip.  -SSI -Frequent CBGS  HTN, HLD, HFrEF Atrial fibrillation with RVR -Currently on heparin -Holding apixiban, lasix, toprol, and pravastatin  -Tried starting her on amiodarone drip for her atrial fibrillation however she became severely hypotensive and had to be placed in trandelenburg position and we had to increase her pressor support.  -Patient was started on digoxin yesterday, is now rate controlled  Gout: -On allopurinol at home, currently holding this  Best practice:   Diet: Trickle feeds Pain/Anxiety/Delirium protocol (if indicated): Fentanyl and midazolam, RASS goal 0 to -1. VAP protocol (if indicated): Yes DVT prophylaxis: SCDs, heparin GI prophylaxis: PPI Glucose control: SSI Mobility: Bedrest Code Status: DNR Family Communication: Spoke with daughter  Disposition: Remain in ICU  Labs   CBC: Recent Labs  Lab 02/20/2018 1507 03/05/2018 2113 02/25/18 0515 02/26/18 0526 02/27/18 0431  WBC 27.5* 25.2* 25.4* 15.6* 12.6*  NEUTROABS 23.3*  --   --   --   --   HGB 12.0 11.8* 12.5 12.6 12.2  HCT 42.5 39.8 40.5 40.1 40.1  MCV 103.2* 98.0 93.3 94.1 93.7  PLT 159 147* 160 137* 119*    Basic Metabolic Panel: Recent Labs  Lab 02/25/18 0515 02/25/18 1502 02/26/18 0526 02/26/18 1600 02/27/18 0431  NA 138 135 133* 133* 134*  K 4.2 4.2 3.6 3.7 3.8  CL 102 94* 88* 84* 85*  CO2 17* 20* 22 29 35*  GLUCOSE 71 141* 181* 169* 140*  BUN 87* 65* 52* 44* 39*  CREATININE 4.92* 4.01* 3.31* 2.65* 2.07*  CALCIUM 7.6* 6.8* 6.5* 6.3* 6.4*  MG 2.2  --  1.9  --  1.8  PHOS 5.4* 4.4 4.5 3.5 1.8*  GFR: Estimated Creatinine Clearance: 21.5 mL/min (A) (by C-G formula based on SCr of 2.07 mg/dL (H)). Recent Labs  Lab 02/05/2018 1507 02/05/2018 1755 02/27/2018 2113 02/25/18 0034 02/25/18 0515 02/26/18 0526 02/27/18 0431  WBC 27.5*  --  25.2*  --  25.4* 15.6* 12.6*  LATICACIDVEN 7.8* 7.6* 5.3* 5.1*  --   --   --     Liver Function Tests: Recent Labs  Lab 02/06/2018 1507 03/03/2018 2113 02/25/18 0515 02/25/18 1502 02/26/18 0526 02/26/18 1600 02/27/18 0431  AST 644* 972* 1,162*  --  1,005*  --   --   ALT 295* 450* 597*  --  670*  --   --   ALKPHOS 93 87 94  --  90  --   --   BILITOT 2.6* 2.5* 2.6*  --  3.1*  --   --   PROT 7.3 6.6 6.9  --  6.4*  --   --   ALBUMIN 3.8 3.4* 3.5 3.2* 2.9*  2.9* 2.9* 2.6*   Recent Labs  Lab 02/26/2018 1507  LIPASE 26   No results for input(s): AMMONIA in the last 168 hours.  ABG    Component Value Date/Time   PHART  7.396 02/25/2018 0644   PCO2ART 32.5 02/25/2018 0644   PO2ART 127.0 (H) 02/25/2018 0644   HCO3 20.1 02/25/2018 0644   TCO2 21 (L) 02/25/2018 0644   ACIDBASEDEF 4.0 (H) 02/25/2018 0644   O2SAT 99.0 02/25/2018 0644     Coagulation Profile: No results for input(s): INR, PROTIME in the last 168 hours.  Cardiac Enzymes: Recent Labs  Lab 02/06/2018 1507 02/13/2018 2113 02/25/18 0300 02/25/18 0820  TROPONINI 0.32* 0.52* 0.70* 1.09*    HbA1C: Hgb A1c MFr Bld  Date/Time Value Ref Range Status  10/23/2017 03:06 PM 7.9 (H) 4.8 - 5.6 % Final    Comment:    (NOTE) Pre diabetes:          5.7%-6.4% Diabetes:              >6.4% Glycemic control for   <7.0% adults with diabetes   09/17/2010 05:27 AM 6.3 (H) <5.7 % Final    Comment:    (NOTE)                                                                       According to the ADA Clinical Practice Recommendations for 2011, when HbA1c is used as a screening test:  >=6.5%   Diagnostic of Diabetes Mellitus           (if abnormal result is confirmed) 5.7-6.4%   Increased risk of developing Diabetes Mellitus References:Diagnosis and Classification of Diabetes Mellitus,Diabetes YKDX,8338,25(KNLZJ 1):S62-S69 and Standards of Medical Care in         Diabetes - 2011,Diabetes Care,2011,34 (Suppl 1):S11-S61.    CBG: Recent Labs  Lab 02/26/18 1112 02/26/18 1517 02/26/18 2001 02/27/18 0029 02/27/18 0428  GLUCAP 161* 132* 146* 155* 130*    Review of Systems:   Unable to obtain due to patients mental status  Past Medical History  She,  has a past medical history of Acid reflux, ARF (acute renal failure) ([place]) (09/16/2010), Atrial fibrillation (HCC), CHF (congestive heart failure) ([place]), Chronic anticoagulation, CKD (chronic kidney disease) stage 3,  GFR 30-59 ml/min ([place]), Diabetes mellitus without complication ([place]), Diverticular hemorrhage (2011), Diverticulosis (2011), GI bleeding (08/03/2011), Gout, medication noncompliance,  Hypercholesterolemia, Hyperglycemia, Hypertension, Internal hemorrhoids (2011), Seasonal allergies, and Thrombocytopenia due to drugs.   Surgical History    Past Surgical History:  Procedure Laterality Date  . ADENOIDECTOMY    . APPENDECTOMY    . BUNIONECTOMY    . CATARACT EXTRACTION    . CESAREAN SECTION    . ESOPHAGOGASTRODUODENOSCOPY  08/20/09   small hiatal hernia/mild gastritis  . ileocolonoscopy  08/20/09   normal terminal ileum/pancolonic diverticula/no polyps/benign colon mucosa  . JOINT REPLACEMENT     Total right knee 2007  . LAPAROTOMY  09/22/2010   Procedure: EXPLORATORY LAPAROTOMY;  Surgeon: [name];  Location: [facility];  Service: General;  Laterality: N/A;  Lysis of Adhesions  . TONSILLECTOMY    . TUBAL LIGATION       Social History   reports that she has never smoked. She has never used smokeless tobacco. She reports that she does not drink alcohol or use drugs.   Family History   Her family history includes Diabetes in her mother; Heart disease in her mother.   Allergies Allergies  Allergen Reactions  . Codeine Nausea And Vomiting  . Sulfa Antibiotics Nausea And Vomiting  . Sulfur Nausea And Vomiting     Home Medications  Prior to Admission medications   Medication Sig Start Date End Date Taking? Authorizing Provider  acetaminophen (TYLENOL) 325 MG tablet Take 2 tablets (650 mg total) by mouth every 4 (four) hours as needed for headache or mild pain. 10/27/17  Yes Emokpae, Courage, MD  allopurinol (ZYLOPRIM) 100 MG tablet Take 100 mg by mouth daily.   Yes [provider]  amitriptyline (ELAVIL) 25 MG tablet Take 25 mg by mouth at bedtime as needed for sleep.  02/14/18  Yes [provider]  apixaban (ELIQUIS) 2.5 MG TABS tablet Take 1 tablet (2.5 mg total) by mouth 2 (two) times daily. 10/27/17  Yes Emokpae, Courage, MD  Artificial Tear Ointment (DRY EYES OP) Place 1 drop into both eyes as needed (dry eyes).   Yes [provider]   ferrous sulfate 325 (65 FE) MG tablet Take 325 mg by mouth 2 (two) times daily with a meal.    Yes [provider]  fluticasone (FLONASE) 50 MCG/ACT nasal spray Place 1 spray into both nostrils daily.   Yes [provider]  furosemide (LASIX) 40 MG tablet Take 1 tablet (40 mg total) by mouth daily. Patient taking differently: Take 20 mg by mouth daily.  10/27/17  Yes Emokpae, Courage, MD  glimepiride (AMARYL) 2 MG tablet Take 1 tablet (2 mg total) by mouth daily with breakfast. 10/28/17  Yes Emokpae, Courage, MD  levalbuterol (XOPENEX) 1.25 MG/3ML nebulizer solution Take 1.25 mg by nebulization every 4 (four) hours as needed for wheezing. 09/10/17  Yes Valentina Shaggy, MD  loratadine (CLARITIN) 10 MG tablet Take 10 mg by mouth daily.   Yes [provider]  metoprolol succinate (TOPROL-XL) 25 MG 24 hr tablet Take 25 mg by mouth every morning.   Yes [provider]  montelukast (SINGULAIR) 10 MG tablet Take 10 mg by mouth at bedtime.    Yes [provider]  Multiple Vitamin (MULTIVITAMIN WITH MINERALS) TABS tablet Take 1 tablet by mouth daily.   Yes [provider]  omeprazole (PRILOSEC) 20 MG capsule Take 1 capsule (20 mg total) by mouth daily. Patient taking differently: Take 20  mg by mouth every morning.  06/19/16  Yes Maczis, Barth Kirks, PA-C  potassium chloride SA (K-DUR,KLOR-CON) 20 MEQ tablet Take 1 tablet (20 mEq total) by mouth daily. 04/01/16  Yes Isaac Bliss, Rayford Halsted, MD  pravastatin (PRAVACHOL) 40 MG tablet Take 40 mg by mouth every evening.    Yes [provider]  sodium chloride (OCEAN) 0.65 % SOLN nasal spray Place 1 spray into both nostrils as needed for congestion.   Yes [provider]  Vitamin D, Ergocalciferol, (DRISDOL) 50000 units CAPS capsule Take 50,000 Units by mouth every 30 (thirty) days. Administered on the 1st of each month   Yes [provider]         Asencion Noble, M.D.  PGY1 Pager [phone] 02/27/2018 6:34 AM

## 2018-02-27 NOTE — Progress Notes (Signed)
Nutrition Follow-up  DOCUMENTATION CODES:   Not applicable  INTERVENTION:    Increase Vital High Protein to 55 ml/h  Pro-stat 30 ml BID  Provides 1520 kcal, 146 gm protein, 1104 ml free water daily  NUTRITION DIAGNOSIS:   Inadequate oral intake related to inability to eat as evidenced by NPO status.  Ongoing  GOAL:   Patient will meet greater than or equal to 90% of their needs  Met with TF  MONITOR:   Vent status, TF tolerance, Labs, Skin, I & O's  ASSESSMENT:   85 yo female with PMH of A fib, HTN, HLD, CHF, diverticulosis, CKD stage IV, and DM who was admitted with severe hyperkalemia and acute renal failure with bilateral pulmonary edema. Required intubation on the evening of 1/20.  Patient remains intubated on ventilator support; still requiring CRRT. Temp (24hrs), Avg:98 F (36.7 C), Min:97.3 F (36.3 C), Max:99 F (37.2 C)   Currently receiving Vital High Protein at 50 ml/h with pro-stat BID to provide 1400 kcal, 135 gm protein, 1003 ml free water daily. Tolerating well.  Labs reviewed. Sodium 134 (L), phosphorus 1.8 (L) CBG's: 155-130-132-133 Medications reviewed and include calcium, novolog, levophed, phenylephrine.    Diet Order:   Diet Order            Diet NPO time specified  Diet effective now              EDUCATION NEEDS:   No education needs have been identified at this time  Skin:  Skin Assessment: Reviewed RN Assessment(MASD to breast)  Last BM:  1/23 (type 7)  Height:   Ht Readings from Last 1 Encounters:  03/05/2018 _0  (1.651 m)    Weight:   Wt Readings from Last 1 Encounters:  02/27/18 85.7 kg    Ideal Body Weight:  56.8 kg  BMI:  Body mass index is 31.44 kg/m.  Estimated Nutritional Needs:   Kcal:  1475  Protein:  130-150 gm  Fluid:  1.4 L    Molli Barrows, RD, LDN, [place] Pager [phone] After Hours Pager [phone]

## 2018-02-27 NOTE — Progress Notes (Addendum)
Pharmacy Antibiotic Note  [name] [name] is a 85 y.o. female admitted on 02/12/2018 with pneumonia.  Patient received Vancomycin and zosyn empirically. De-escalating to Vancomycin and Ancef today due to resp. culture of Klebsiella and MRSA.   WBC trending down to 12.6, afebrile  Of note, patient on CRRT.   Plan: Increase Vancomycin to 1g IV q24h  Continue ancef 2g IV q12h  Discontinue Zosyn due to c/s  Monitor cultures, WBC, temp, and vanc level as indicated  Height: 5\' 5"  (165.1 cm) Weight: 188 lb 15 oz (85.7 kg) IBW/kg (Calculated) : 57  Temp (24hrs), Avg:98 F (36.7 C), Min:97.3 F (36.3 C), Max:99 F (37.2 C)  Recent Labs  Lab 02/09/2018 1507  03/05/2018 1755 02/13/2018 2113 02/25/18 0034 02/25/18 0515 02/25/18 1502 02/26/18 0526 02/26/18 1600 02/27/18 0431  WBC 27.5*  --   --  25.2*  --  25.4*  --  15.6*  --  12.6*  CREATININE 6.03*   < >  --  6.14*  --  4.92* 4.01* 3.31* 2.65* 2.07*  LATICACIDVEN 7.8*  --  7.6* 5.3* 5.1*  --   --   --   --   --    < > = values in this interval not displayed.    Estimated Creatinine Clearance: 21.5 mL/min (A) (by C-G formula based on SCr of 2.07 mg/dL (H)).    Allergies  Allergen Reactions  . Codeine Nausea And Vomiting  . Sulfa Antibiotics Nausea And Vomiting  . Sulfur Nausea And Vomiting    Antimicrobials this admission: 1/21 zosyn >>1/23  1/22 Vanc >>  1/23 ancef>>   Microbiology results: 1/20 BCx: no growth 1/21 TA: MRSA and klebsiella  1/2 MRSA PCR: positive   Thank you for allowing pharmacy to be a part of this patient's care.  Carlyon Shadow 02/27/2018 10:24 AM

## 2018-02-27 NOTE — Care Management Note (Addendum)
Case Management Note  Patient Details  Name: [name] [name] MRN: [id number] Date of Birth: [birth year]  Subjective/Objective:   Pt admitted with shock ,sepsis and HF                   Action/Plan:  PTA from home.     Expected Discharge Date:                  Expected Discharge Plan:     In-House Referral:  Clinical Social Work  Discharge planning Services  CM Consult  Post Acute Care Choice:    Choice offered to:     DME Arranged:    DME Agency:     HH Arranged:    HH Agency:     Status of Service:  In process, will continue to follow  If discussed at Long Length of Stay Meetings, dates discussed:    Additional Comments: 02/27/2018' Pt remains on ventilator, CRRT and pressors.  Pt has a guarded prognosis and [place] discussions scheduled for Friday 02/28/18 Maryclare Labrador, RN 02/27/2018, 11:55 AM

## 2018-02-27 NOTE — Progress Notes (Signed)
ANTICOAGULATION CONSULT NOTE - Follow Up Consult  Pharmacy Consult for IV Heparin Indication: atrial fibrillation  Allergies  Allergen Reactions  . Codeine Nausea And Vomiting  . Sulfa Antibiotics Nausea And Vomiting  . Sulfur Nausea And Vomiting    Patient Measurements: Height: 5\' 5"  (165.1 cm) Weight: 188 lb 15 oz (85.7 kg) IBW/kg (Calculated) : 57 Heparin Dosing Weight: 79 kg  Vital Signs: Temp: 97.7 F (36.5 C) (01/23 1500) Temp Source: Bladder (01/23 0400) Pulse Rate: 93 (01/23 1500)  Labs: Recent Labs    02/17/2018 2113 02/25/18 0300 02/25/18 0515 02/25/18 0820  02/25/18 1634  02/26/18 0526  02/26/18 1600 02/26/18 2142 02/27/18 0431 02/27/18 1500  HGB 11.8*  --  12.5  --   --   --   --  12.6  --   --   --  12.2  --   HCT 39.8  --  40.5  --   --   --   --  40.1  --   --   --  40.1  --   PLT 147*  --  160  --   --   --   --  137*  --   --   --  119*  --   APTT 42*  --   --   --    < > >200*   < > 95*   < >  --  91* 51* 38*  HEPARINUNFRC  --   --   --   --    < > >2.20*  --  >2.20*  --   --   --  1.42*  --   CREATININE 6.14*  --  4.92*  --    < >  --   --  3.31*  --  2.65*  --  2.07* 1.98*  TROPONINI 0.52* 0.70*  --  1.09*  --   --   --   --   --   --   --   --   --    < > = values in this interval not displayed.    Estimated Creatinine Clearance: 22.5 mL/min (A) (by C-G formula based on SCr of 1.98 mg/dL (H)).   Medications:  Infusions:  .  prismasol BGK 4/2.5 400 mL/hr at 02/27/18 0806  .  prismasol BGK 4/2.5 400 mL/hr at 02/27/18 0806  . sodium chloride Stopped (02/27/18 1410)  .  ceFAZolin (ANCEF) IV Stopped (02/27/18 1222)  . fentaNYL infusion INTRAVENOUS Stopped (02/27/18 1023)  . heparin 700 Units/hr (02/27/18 1500)  . norepinephrine (LEVOPHED) Adult infusion Stopped (02/27/18 0915)  . phenylephrine (NEO-SYNEPHRINE) Adult infusion Stopped (02/27/18 1100)  . prismasol BGK 4/2.5 1,500 mL/hr at 02/27/18 1429  . sodium chloride 999 mL/hr at 02/18/2018  2306  . vancomycin 1,000 mg (02/27/18 1410)  . vasopressin (PITRESSIN) infusion - *FOR SHOCK* Stopped (02/27/18 2956)    Assessment: 85 year old female transitioned from oral Eliquis to IV Heparin. Night shift RN noted appearance of blood in stool 1/21 while cleaning up patient after supra-therapeutic aPTT. RN contacted MD to determine plan who was okay with resuming IV heparin now that aPTT was down. APTT trending down today (51 sec to 38 sec) on gtt 700.    RN reports no issues with infusion, small amount of bloody vaginal discharge 1/23. Heparin level remains elevated due to Eliquis. Plt down to 119.   Goal of Therapy:  Heparin level 0.3-0.7 units/ml aPTT 66-102 seconds Monitor platelets by anticoagulation protocol: Yes  Plan:  Increase Heparin to 850 units/hr  F/u 8 hr PTT and HL  Monitor daily aPTT and HL until correlating.   Thank you for allowing pharmacy to be a part of this patient's care.  Carlyon Shadow, PharmD Candidate   **Pharmacist phone directory can now be found on [email or web address] ([email or web address]).  Listed under [place]. 02/27/2018  3:53 PM

## 2018-02-27 NOTE — Progress Notes (Signed)
Pharmacist Heart Failure Core Measure Documentation  Assessment: [name] [name] has an EF documented as 20-25% on 1/21 by [facility].  Rationale: Heart failure patients with left ventricular systolic dysfunction (LVSD) and an EF < 40% should be prescribed an angiotensin converting enzyme inhibitor (ACEI) or angiotensin receptor blocker (ARB) at discharge unless a contraindication is documented in the medical record.  This patient is not currently on an ACEI or ARB for HF.  This note is being placed in the record in order to provide documentation that a contraindication to the use of these agents is present for this encounter.  ACE Inhibitor or Angiotensin Receptor Blocker is contraindicated (specify all that apply)  []   ACEI allergy AND ARB allergy []   Angioedema []   Moderate or severe aortic stenosis []   Hyperkalemia []   Hypotension []   Renal artery stenosis [x]   Worsening renal function, preexisting renal disease or dysfunction   [name] 02/27/2018 4:34 PM

## 2018-02-27 NOTE — Progress Notes (Signed)
eLink Physician-Brief Progress Note Patient Name: [name] [name] DOB: 22-Feb-1930 MRN: [id number]   Date of Service  02/27/2018  HPI/Events of Note  Hypocalcemia  eICU Interventions  Calcium gluconate 2 gm iv         [name] [name] 02/27/2018, 5:34 AM

## 2018-02-28 LAB — CBC
HCT: 40.5 % (ref 36.0–46.0)
Hemoglobin: 12.5 g/dL (ref 12.0–15.0)
MCH: 28.6 pg (ref 26.0–34.0)
MCHC: 30.9 g/dL (ref 30.0–36.0)
MCV: 92.7 fL (ref 80.0–100.0)
Platelets: 104 10*3/uL — ABNORMAL LOW (ref 150–400)
RBC: 4.37 MIL/uL (ref 3.87–5.11)
RDW: 18.7 % — ABNORMAL HIGH (ref 11.5–15.5)
WBC: 14.9 10*3/uL — ABNORMAL HIGH (ref 4.0–10.5)
nRBC: 3.6 % — ABNORMAL HIGH (ref 0.0–0.2)

## 2018-02-28 LAB — RENAL FUNCTION PANEL
Albumin: 2.6 g/dL — ABNORMAL LOW (ref 3.5–5.0)
Albumin: 2.6 g/dL — ABNORMAL LOW (ref 3.5–5.0)
Anion gap: 10 (ref 5–15)
Anion gap: 11 (ref 5–15)
BUN: 32 mg/dL — ABNORMAL HIGH (ref 8–23)
BUN: 29 mg/dL — ABNORMAL HIGH (ref 8–23)
CO2: 24 mmol/L (ref 22–32)
CO2: 25 mmol/L (ref 22–32)
Calcium: 7.7 mg/dL — ABNORMAL LOW (ref 8.9–10.3)
Calcium: 7.6 mg/dL — ABNORMAL LOW (ref 8.9–10.3)
Chloride: 100 mmol/L (ref 98–111)
Chloride: 98 mmol/L (ref 98–111)
Creatinine, Ser: 1.6 mg/dL — ABNORMAL HIGH (ref 0.44–1.00)
Creatinine, Ser: 1.42 mg/dL — ABNORMAL HIGH (ref 0.44–1.00)
GFR calc Af Amer: 34 mL/min — ABNORMAL LOW (ref >60)
GFR calc Af Amer: 39 mL/min — ABNORMAL LOW (ref >60)
GFR calc non Af Amer: 29 mL/min — ABNORMAL LOW (ref >60)
GFR calc non Af Amer: 34 mL/min — ABNORMAL LOW (ref >60)
Glucose, Bld: 169 mg/dL — ABNORMAL HIGH (ref 70–99)
Glucose, Bld: 196 mg/dL — ABNORMAL HIGH (ref 70–99)
Phosphorus: 1.1 mg/dL — ABNORMAL LOW (ref 2.5–4.6)
Phosphorus: 2.8 mg/dL (ref 2.5–4.6)
Potassium: 4.1 mmol/L (ref 3.5–5.1)
Potassium: 4 mmol/L (ref 3.5–5.1)
Sodium: 134 mmol/L — ABNORMAL LOW (ref 135–145)
Sodium: 134 mmol/L — ABNORMAL LOW (ref 135–145)

## 2018-02-28 LAB — APTT
aPTT: 69 seconds — ABNORMAL HIGH (ref 24–36)
aPTT: 97 seconds — ABNORMAL HIGH (ref 24–36)

## 2018-02-28 LAB — HEPARIN LEVEL (UNFRACTIONATED)
Heparin Unfractionated: 1.06 IU/mL — ABNORMAL HIGH (ref 0.30–0.70)
Heparin Unfractionated: 1.09 IU/mL — ABNORMAL HIGH (ref 0.30–0.70)

## 2018-02-28 LAB — LIPID PANEL
Cholesterol: 68 mg/dL (ref 0–200)
HDL: <10 mg/dL — ABNORMAL LOW (ref >40)
Triglycerides: 105 mg/dL (ref <150)
VLDL: 21 mg/dL (ref 0–40)

## 2018-02-28 LAB — TSH: TSH: 6.532 u[IU]/mL — ABNORMAL HIGH (ref 0.350–4.500)

## 2018-02-28 LAB — MAGNESIUM: Magnesium: 2.3 mg/dL (ref 1.7–2.4)

## 2018-02-28 LAB — GLUCOSE, CAPILLARY
Glucose-Capillary: 154 mg/dL — ABNORMAL HIGH (ref 70–99)
Glucose-Capillary: 138 mg/dL — ABNORMAL HIGH (ref 70–99)
Glucose-Capillary: 172 mg/dL — ABNORMAL HIGH (ref 70–99)
Glucose-Capillary: 164 mg/dL — ABNORMAL HIGH (ref 70–99)
Glucose-Capillary: 136 mg/dL — ABNORMAL HIGH (ref 70–99)

## 2018-02-28 LAB — T4, FREE: Free T4: 0.93 ng/dL (ref 0.82–1.77)

## 2018-02-28 MED ORDER — FENTANYL CITRATE (PF) 100 MCG/2ML IJ SOLN
25.0000 ug | INTRAMUSCULAR | Status: DC | PRN
  Administered 2018-02-28 – 2018-03-02 (×3): 50 ug via INTRAVENOUS
  Filled 2018-02-28 (×2): qty 2

## 2018-02-28 MED ORDER — METOPROLOL TARTRATE 5 MG/5ML IV SOLN
INTRAVENOUS | Status: AC
  Filled 2018-02-28: qty 5

## 2018-02-28 MED ORDER — FENTANYL CITRATE (PF) 100 MCG/2ML IJ SOLN: 25.0000 ug | INTRAMUSCULAR | Status: DC | PRN

## 2018-02-28 MED ORDER — METOPROLOL TARTRATE 5 MG/5ML IV SOLN
5.0000 mg | Freq: Once | INTRAVENOUS | Status: AC
  Administered 2018-02-28: 5 mg via INTRAVENOUS

## 2018-02-28 MED ORDER — SODIUM PHOSPHATES 45 MMOLE/15ML IV SOLN
40.0000 mmol | Freq: Once | INTRAVENOUS | Status: AC
  Administered 2018-02-28: 40 mmol via INTRAVENOUS
  Filled 2018-02-28: qty 13.33

## 2018-02-28 NOTE — Progress Notes (Signed)
Progress Note  Patient Name: [name] [name] Date of Encounter: 02/28/2018  Primary Cardiologist: Yolonda Kida, MD    Subjective    85 yo with hx of cardiomyopathy, atrial fib, CKD, GI bleed transferred from [facility] .   We were asked to assist in management of her rapid atrial fib.  She remains anuric - is on CVVH Has had progressive LV dysfunction - is a poor candidate for invasive cardiac procedures at this point Has chronic AFib.   - HR is fairly well controlled.    Inpatient Medications    Scheduled Meds: . calcium carbonate (dosed in mg elemental calcium)  1,000 mg of elemental calcium Per Tube TID  . chlorhexidine gluconate (MEDLINE KIT)  15 mL Mouth Rinse BID  . Chlorhexidine Gluconate Cloth  6 each Topical Q0600  . [START ON 03/03/2018] Chlorhexidine Gluconate Cloth  6 each Topical Daily  . feeding supplement (PRO-STAT SUGAR FREE 64)  30 mL Per Tube BID  . feeding supplement (VITAL HIGH PROTEIN)  1,000 mL Per Tube Q24H  . fentaNYL (SUBLIMAZE) injection  50 mcg Intravenous Once  . insulin aspart  0-9 Units Subcutaneous Q4H  . mouth rinse  15 mL Mouth Rinse 10 times per day  . mupirocin ointment  1 application Nasal BID  . pantoprazole (PROTONIX) IV  40 mg Intravenous Daily  . sodium chloride flush  10-40 mL Intracatheter Q12H   Continuous Infusions: .  prismasol BGK 4/2.5 400 mL/hr at 02/27/18 2045  .  prismasol BGK 4/2.5 400 mL/hr at 02/27/18 2046  . sodium chloride 10 mL/hr at 02/28/18 0700  .  ceFAZolin (ANCEF) IV Stopped (02/27/18 2239)  . fentaNYL infusion INTRAVENOUS Stopped (02/27/18 1023)  . heparin 850 Units/hr (02/28/18 0700)  . norepinephrine (LEVOPHED) Adult infusion Stopped (02/27/18 0915)  . phenylephrine (NEO-SYNEPHRINE) Adult infusion Stopped (02/27/18 1100)  . prismasol BGK 4/2.5 1,500 mL/hr at 02/28/18 0659  . sodium chloride 999 mL/hr at 02/08/2018 2306  . sodium phosphate  Dextrose 5% IVPB    . vancomycin Stopped (02/27/18 1510)  .  vasopressin (PITRESSIN) infusion - *FOR SHOCK* Stopped (02/27/18 0836)   PRN Meds: fentaNYL, heparin, midazolam, midazolam, sodium chloride, sodium chloride flush   Vital Signs    Vitals:   02/28/18 0450 02/28/18 0500 02/28/18 0600 02/28/18 0700  BP:      Pulse:   90 81  Resp:   (!) 22 (!) 23  Temp:   97.7 F (36.5 C) (!) 97.2 F (36.2 C)  TempSrc:      SpO2: 100%  100% 100%  Weight:  82.9 kg    Height:        Intake/Output Summary (Last 24 hours) at 02/28/2018 0755 Last data filed at 02/28/2018 0700 Gross per 24 hour  Intake 2718.4 ml  Output 4998 ml  Net -2279.6 ml   Last 3 Weights 02/28/2018 02/27/2018 02/26/2018  Weight (lbs) 182 lb 12.2 oz 188 lb 15 oz 191 lb 12.8 oz  Weight (kg) 82.9 kg 85.7 kg 87 kg      Telemetry    Afib with V rate of 100-110 - Personally Reviewed  ECG     - Personally Reviewed  Physical Exam   GEN:  elderly female, on the vent , much more awake today    Neck: No JVD Cardiac:  irreg irreg.  Respiratory:  on the vent . GI: Soft, nontender, non-distended  MS: No edema; No deformity. Neuro:   difficlut to assess  Psych:  difficult to assess.   Labs    Chemistry Recent Labs  Lab 02/17/2018 2113 02/25/18 0515  02/26/18 0526  02/27/18 0431 02/27/18 1500 02/28/18 0506  NA 138 138   < > 133*   < > 134* 133* 134*  K 6.0* 4.2   < > 3.6   < > 3.8 4.0 4.1  CL 110 102   < > 88*   < > 85* 90* 100  CO2 10* 17*   < > 22   < > 35* 29 24  GLUCOSE 273* 71   < > 181*   < > 140* 195* 169*  BUN 111* 87*   < > 52*   < > 39* 36* 32*  CREATININE 6.14* 4.92*   < > 3.31*   < > 2.07* 1.98* 1.60*  CALCIUM 7.9* 7.6*   < > 6.5*   < > 6.4* 7.2* 7.7*  PROT 6.6 6.9  --  6.4*  --   --   --   --   ALBUMIN 3.4* 3.5   < > 2.9*  2.9*   < > 2.6* 2.6* 2.6*  AST 972* 1,162*  --  1,005*  --   --   --   --   ALT 450* 597*  --  670*  --   --   --   --   ALKPHOS 87 94  --  90  --   --   --   --   BILITOT 2.5* 2.6*  --  3.1*  --   --   --   --   GFRNONAA 6* 7*   < >  12*   < > 21* 22* 29*  GFRAA 7* 9*   < > 14*   < > 25* 26* 34*  ANIONGAP 18* 19*   < > 23*   < > _0 < > = values in this interval not displayed.     Hematology Recent Labs  Lab 02/26/18 0526 02/27/18 0431 02/28/18 0505  WBC 15.6* 12.6* 14.9*  RBC 4.26 4.28 4.37  HGB 12.6 12.2 12.5  HCT 40.1 40.1 40.5  MCV 94.1 93.7 92.7  MCH 29.6 28.5 28.6  MCHC 31.4 30.4 30.9  RDW 19.8* 18.8* 18.7*  PLT 137* 119* 104*    Cardiac Enzymes Recent Labs  Lab 03/04/2018 1507 02/20/2018 2113 02/25/18 0300 02/25/18 0820  TROPONINI 0.32* 0.52* 0.70* 1.09*   No results for input(s): TROPIPOC in the last 168 hours.   BNP Recent Labs  Lab 02/08/2018 1458  BNP 2,594.0*     DDimer No results for input(s): DDIMER in the last 168 hours.   Radiology    No results found.  Cardiac Studies     Patient Profile     85 y.o. female  With CHF, Atrial  Fib, CKD   Assessment & Plan    1.   Atrial fib:   HR is a bit elevated.   Her BP is higher and should allow [place] to start low dose metoprolol .   Will add metoprolol 12.5 per NG  BID  We have stopped eliquis due to her progressive renal failure.  She is on heparin drip   2.   Acute on chronic systolic CHF:   EF is now 20-25%. Inf. Lat akinesis.    Continue medical therapy .   She is not a cath candidate at this point .         For questions or  updates, please contact [place] Please consult [email or web address] for contact info under        Signed, Mertie Moores, MD  02/28/2018, 7:55 AM

## 2018-02-28 NOTE — Progress Notes (Signed)
ANTICOAGULATION CONSULT NOTE - Follow Up Consult  Pharmacy Consult for heparin Indication: atrial fibrillation  Labs: Recent Labs    02/25/18 0300  02/25/18 0515 02/25/18 0820  02/26/18 0526  02/26/18 1600  02/27/18 0431 02/27/18 1500 02/28/18 0027  HGB  --    < > 12.5  --   --  12.6  --   --   --  12.2  --   --   HCT  --   --  40.5  --   --  40.1  --   --   --  40.1  --   --   PLT  --   --  160  --   --  137*  --   --   --  119*  --   --   APTT  --   --   --   --    < > 95*   < >  --    < > 51* 38* 69*  HEPARINUNFRC  --   --   --   --    < > >2.20*  --   --   --  1.42*  --  1.06*  CREATININE  --   --  4.92*  --    < > 3.31*  --  2.65*  --  2.07* 1.98*  --   TROPONINI 0.70*  --   --  1.09*  --   --   --   --   --   --   --   --    < > = values in this interval not displayed.    Assessment/Plan:  85yo female therapeutic on heparin after rate change. Will continue gtt at current rate and confirm stable with am labs.   Wynona Neat, PharmD, BCPS  02/28/2018,1:08 AM

## 2018-02-28 NOTE — Progress Notes (Signed)
ANTICOAGULATION CONSULT NOTE - Follow Up Consult  Pharmacy Consult for IV Heparin Indication: atrial fibrillation  Allergies  Allergen Reactions  . Codeine Nausea And Vomiting  . Sulfa Antibiotics Nausea And Vomiting  . Sulfur Nausea And Vomiting    Patient Measurements: Height: 5\' 5"  (165.1 cm) Weight: 182 lb 12.2 oz (82.9 kg) IBW/kg (Calculated) : 57 Heparin Dosing Weight: 79 kg  Vital Signs: Temp: 97.2 F (36.2 C) (01/24 0700) Temp Source: Bladder (01/23 2100) Pulse Rate: 81 (01/24 0700)  Labs: Recent Labs    02/25/18 0820  02/26/18 0526  02/27/18 0431 02/27/18 1500 02/28/18 0027 02/28/18 0505 02/28/18 0506  HGB  --    < > 12.6  --  12.2  --   --  12.5  --   HCT  --   --  40.1  --  40.1  --   --  40.5  --   PLT  --   --  137*  --  119*  --   --  104*  --   APTT  --    < > 95*   < > 51* 38* 69*  --  97*  HEPARINUNFRC  --    < > >2.20*  --  1.42*  --  1.06*  --  1.09*  CREATININE  --    < > 3.31*   < > 2.07* 1.98*  --   --  1.60*  TROPONINI 1.09*  --   --   --   --   --   --   --   --    < > = values in this interval not displayed.    Estimated Creatinine Clearance: 27.4 mL/min (A) (by C-G formula based on SCr of 1.6 mg/dL (H)).   Medications:  Infusions:  .  prismasol BGK 4/2.5 400 mL/hr at 02/27/18 2045  .  prismasol BGK 4/2.5 400 mL/hr at 02/27/18 2046  . sodium chloride 10 mL/hr at 02/28/18 0700  .  ceFAZolin (ANCEF) IV Stopped (02/27/18 2239)  . fentaNYL infusion INTRAVENOUS Stopped (02/27/18 1023)  . heparin 850 Units/hr (02/28/18 0700)  . norepinephrine (LEVOPHED) Adult infusion Stopped (02/27/18 0915)  . phenylephrine (NEO-SYNEPHRINE) Adult infusion Stopped (02/27/18 1100)  . prismasol BGK 4/2.5 1,500 mL/hr at 02/28/18 0659  . sodium chloride 999 mL/hr at 02/27/2018 2306  . vancomycin Stopped (02/27/18 1510)  . vasopressin (PITRESSIN) infusion - *FOR SHOCK* Stopped (02/27/18 7371)    Assessment: 85 year old female transitioned from oral Eliquis  to IV Heparin. Night shift RN noted appearance of blood in stool 1/21 while cleaning up patient after supra-therapeutic aPTT. RN contacted MD to determine plan who was okay with resuming IV heparin now that aPTT was down. APTT is therapeutic but trending up near top of range. RN reports no bleeding or IV infusion issues overnight. Will adjust Heparin down slightly to ensure levels stay in range.    Heparin level remains elevated due to Eliquis. Plt down to 104.   Of note, patient on CRRT but may be transitioned to [facility].   Goal of Therapy:  Heparin level 0.3-0.7 units/ml aPTT 66-102 seconds Monitor platelets by anticoagulation protocol: Yes   Plan:  Decrease Heparin to 800 units/hr  Monitor daily aPTT and HL until correlating.   Thank you for allowing pharmacy to be a part of this patient's care.  Carlyon Shadow, PharmD Candidate   **Pharmacist phone directory can now be found on [email or web address] ([email or web address]).  Listed under [place]. 02/28/2018  7:39 AM

## 2018-02-28 NOTE — Progress Notes (Signed)
Patient ID: [name] [name], female   DOB: 04/17/1930, 85 y.o.   MRN: [id number]  [facility] Progress Note   Assessment/ Plan:   1. Acute kidney Injury on chronic kidney disease stage IV-V: Remains anuric and without any renal recovery-likely has progressed on to ESRD.  We will continue CRRT for another 24 hours with efforts at ultrafiltration (increased to 150 cc/hour) now that she is off of pressors with plans to discontinue tomorrow morning and begin transition to intermittent hemodialysis. 2.  Hyponatremia: Secondary to free water excretion defect with acute kidney injury-monitor with UF/CRRT. 3.  Acute hypoxic respiratory failure: Remains on ventilator support, continue UF with CRRT. 4.  Septic versus cardiogenic shock: Source identification pending, weaned off of pressors 5.  Hypophosphatemia: Secondary to CRRT losses/prescription-will order for replacement of phosphorus.  Subjective:   No significant events noted overnight, hypocalcemia noted on labs.   Objective:   BP 118/76 (BP Location: Left Arm)   Pulse 81   Temp (!) 97.2 F (36.2 C)   Resp (!) 23   Ht _0  (1.651 m)   Wt 82.9 kg   SpO2 100%   BMI 30.41 kg/m   Intake/Output Summary (Last 24 hours) at 02/28/2018 0739 Last data filed at 02/28/2018 0700 Gross per 24 hour  Intake 2718.4 ml  Output 4998 ml  Net -2279.6 ml   Weight change: -2.8 kg  Physical Exam: Gen: Intubated, awake and responding to commands CVS: Regular rhythm, normal rate, S1 and S2 normal Resp: Anteriorly clear to auscultation, no distinct rales or rhonchi Abd: Soft, obese, nontender Ext: Trace edema over dependent areas/upper extremities  Imaging: No results found.  Labs: BMET Recent Labs  Lab 02/25/18 0515 02/25/18 1502 02/26/18 0526 02/26/18 1600 02/27/18 0431 02/27/18 1500 02/28/18 0506  NA 138 135 133* 133* 134* 133* 134*  K 4.2 4.2 3.6 3.7 3.8 4.0 4.1  CL 102 94* 88* 84* 85* 90* 100  CO2 17* 20* 22 29 35* 29 24   GLUCOSE 71 141* 181* 169* 140* 195* 169*  BUN 87* 65* 52* 44* 39* 36* 32*  CREATININE 4.92* 4.01* 3.31* 2.65* 2.07* 1.98* 1.60*  CALCIUM 7.6* 6.8* 6.5* 6.3* 6.4* 7.2* 7.7*  PHOS 5.4* 4.4 4.5 3.5 1.8* 3.4 1.1*   CBC Recent Labs  Lab 03/05/2018 1507  02/25/18 0515 02/26/18 0526 02/27/18 0431 02/28/18 0505  WBC 27.5*   < > 25.4* 15.6* 12.6* 14.9*  NEUTROABS 23.3*  --   --   --   --   --   HGB 12.0   < > 12.5 12.6 12.2 12.5  HCT 42.5   < > 40.5 40.1 40.1 40.5  MCV 103.2*   < > 93.3 94.1 93.7 92.7  PLT 159   < > 160 137* 119* 104*   < > = values in this interval not displayed.    Medications:    . calcium carbonate (dosed in mg elemental calcium)  1,000 mg of elemental calcium Per Tube TID  . chlorhexidine gluconate (MEDLINE KIT)  15 mL Mouth Rinse BID  . Chlorhexidine Gluconate Cloth  6 each Topical Q0600  . [START ON 03/03/2018] Chlorhexidine Gluconate Cloth  6 each Topical Daily  . feeding supplement (PRO-STAT SUGAR FREE 64)  30 mL Per Tube BID  . feeding supplement (VITAL HIGH PROTEIN)  1,000 mL Per Tube Q24H  . fentaNYL (SUBLIMAZE) injection  50 mcg Intravenous Once  . insulin aspart  0-9 Units Subcutaneous Q4H  . mouth rinse  15 mL Mouth Rinse 10 times per day  . mupirocin ointment  1 application Nasal BID  . pantoprazole (PROTONIX) IV  40 mg Intravenous Daily  . sodium chloride flush  10-40 mL Intracatheter Q12H   Elmarie Shiley, MD 02/28/2018, 7:39 AM

## 2018-02-28 NOTE — Progress Notes (Signed)
RN called back to tell Tech that the EEG is being cancelled.

## 2018-02-28 NOTE — Progress Notes (Signed)
NAME:  [name] [name], MRN:  [id number], DOB:  Oct 04, 1930, LOS: 4 ADMISSION DATE:  03/01/2018, CONSULTATION DATE:  02/23/2018 REFERRING MD:  Martin Majestic - [facility]  CHIEF COMPLAINT:  SOB   Brief History   This is an 85 year old female who presented to [facility] with SOB, cough, weakness and found to have multiple metabolic derangements including AoCKD with Scr 6, BUN 445, K 7.2. Received temporizing measures and transferred to [facility] for further management.   History of present illness   [name] [name] is a 85 y.o. female who has a PMH as outlined below (see "past medical history") including HTN, HLD, CHF, a fib, and CKD.  She presented to [facility] ED 1/20 with SOB, cough, weakness.  On EMS arrival, she was found to be hypoglycemic to around 50.  She received oral glucose and IM glucagon.  She was taken to ED where she remained with generalized weakness.  She was found to have multiple metabolic derangements including K 7.2, SCr 6.03, BUN 445 (165 on repeat), AG 20, BNP 2594, Trop 0.32, lactate 7.8, WBC 27.5.  ABG demonstrated metabolic + respiratory acidosis (7.0 / 38 / 42).  She was transferred to [facility] for further management with possibility of emergent HD.  After arrival to [facility], she was hypotensive with SBP in 70s.  Additionally, she had kussmaul respirations and although she was responsive, she was slow to respond.  She was started on BiPAP but later required intubation for continued respiratory insufficiency.  Past Medical History  HTN, HLD, CHF, A.fib, CKD, DM, diverticulosis, GERD.  Significant Hospital Events   1/20 >Admitted to ICU  Consults:  Nephrology Cardiology  Procedures:  ETT 1/20 >  R IJ HD cath 1/20 >   Significant Diagnostic Tests:  CXR 1/20 > CM with pulmonary edema. Echo 1/20 >  Impressions:  - Compared to a prior study in 2019, the LVEF is further reduced to   20-25% with more severe RV hypokinesis, inferior and lateral LV   hypokinesis to akinesis and elevated biventricular filling  pressure.    Micro Data:  Blood 1/20 > NGTD Sputum 1/20 > Staphylococcus aureus and klebsiella pneumonia Urine 1/20 > <10K colonies, insignificant  Antimicrobials:  Zosyn 02/24/17  Interim history/subjective:  No acute events overnight.  This morning patient was alert and following commands.  She appeared to be mouthing words around the ET tube.  No family at bedside.  Objective   Blood pressure 118/76, pulse 90, temperature 97.7 F (36.5 C), resp. rate (!) 22, height 5\' 5"  (1.651 m), weight 82.9 kg, SpO2 100 %.    Vent Mode: PRVC FiO2 (%):  [40 %] 40 % Set Rate:  [18 bmp] 18 bmp Vt Set:  [450 mL] 450 mL PEEP:  [5 cmH20] 5 cmH20 Pressure Support:  [15 cmH20] 15 cmH20 Plateau Pressure:  [17 cmH20-20 cmH20] 20 cmH20   Intake/Output Summary (Last 24 hours) at 02/28/2018 3151 Last data filed at 02/28/2018 7616 Gross per 24 hour  Intake 2841.83 ml  Output 5113 ml  Net -2271.17 ml   Filed Weights   02/26/18 0300 02/27/18 0200 02/28/18 0500  Weight: 87 kg 85.7 kg 82.9 kg    Examination: General: Intubated, sedated, ill appearing, awake, following commands HENT: Central line in place, atraumatic, normocephalic Lungs: Basilar crackles, normal work of breathing Cardiovascular: Tachycardia, no m/r/g Abdomen: Soft, non-tender, no distension Extremities: Trace BL LE edema, warm hands, warm knees Neuro: Sedated, awake, following commands GU: No Foley in place  Resolved Hospital Problem list    Assessment & Plan:  Hypoxic respiratory distress 2/2 pulmonary edema Cardiogenic shock  Septic shock Leukocytosis: Metabolic acidosis: -Patient has been weaned off all of her vasopressors and is maintaining a map >65.  Her tracheal aspirate did show MRSA and Klebsiella however with rare WBCs. WBC has come down to 12.6 from 15.6 yesterday.  She is currently on Zosyn and vancomycin.  Likely a mix of cardiogenic and septic shock, overall she has been improving.   -She has seen the  Cardiologist Dr. Clayborn Bigness at [facility], last seen on 10/19 and her echocardiogram at that time showed an EF of around 45%. Echocardiogram showed EF 20-25%, with severe RV hypokinesis, interior and lateral LV hypokinesis and elevated biventricular filling pressures.  -Cardiology following, appreciate recommendations >Start low dose metoprolol 12.5 BID, not a cath candidate at this point.  -RT attempted a spontaneous breathing trial today and patient having tachypnea to the 30s and appeared very distressed patient was given a dose of Versed which significantly dropped her blood pressure.  Suctioning of the ET tube showed a mucous plug blocking the tube which is likely the cause of her tachypnea.  Her blood pressure improved with no intervention as her versed wore off.  She appears to be very sensitive to certain medications, amiodarone and Versed both had a significant effect on her blood pressure. We will be very cautious with these medications. -Continue fentanyl for sedation, continue versed for now -Start chest PT to decrease mucus plugging  -Daily sedation vacation, daily SBT -Continue vancomycin and cefazolin -Continue neosyynephrine PRN and levophed drip PRN, goal MAP >65 -Start metoprolol 12.5 daily today, can increase to BID tomorrow if her blood pressure is stable  AoCKD: Hyperkalemia: -Nephrology following and managing CRRT. They suspect that her CKD has progressed to ESRD, etiology is thought to be ATN. Patient remains anuric.  -Continue CRRT for 24 hours, transition to intermittent hemodialysis  Transaminitis: Likely 2/2 shock liver. Today's labs are similar to yesterday. Continue to monitor.    Elevated troponin: -Troponin has trended upwards, 0.52 > 0.7 > 1.09 -Likely 2/2 demand ischemia  Diabetes mellitus: -Had started a D10 drip yesterday and trickle feeds due to hypoglycemia. Today her CBGs have remained elevated. Will discontinue D10 drip.  -SSI -Frequent CBGS  HTN, HLD,  HFrEF Atrial fibrillation with RVR -Currently on heparin -Holding apixiban, lasix, toprol, and pravastatin  -Tried starting her on amiodarone drip for her atrial fibrillation however she became severely hypotensive and had to be placed in trandelenburg position and we had to increase her pressor support.  -Patient was started on digoxin, this was discontinued yesterday -Start metoprolol 12.5 daily today   Gout: -On allopurinol at home, currently holding this  Best practice:  Diet: Trickle feeds Pain/Anxiety/Delirium protocol (if indicated): Fentanyl and midazolam, RASS goal 0 to -1. VAP protocol (if indicated): Yes DVT prophylaxis: SCDs, heparin GI prophylaxis: PPI Glucose control: SSI Mobility: Bedrest Code Status: DNR Family Communication: Spoke with daughter  Disposition: Remain in ICU  Labs   CBC: Recent Labs  Lab 03/07/2018 1507 02/25/2018 2113 02/25/18 0515 02/26/18 0526 02/27/18 0431  WBC 27.5* 25.2* 25.4* 15.6* 12.6*  NEUTROABS 23.3*  --   --   --   --   HGB 12.0 11.8* 12.5 12.6 12.2  HCT 42.5 39.8 40.5 40.1 40.1  MCV 103.2* 98.0 93.3 94.1 93.7  PLT 159 147* 160 137* 119*    Basic Metabolic Panel: Recent Labs  Lab 02/25/18 0515  02/26/18  6160 02/26/18 1600 02/27/18 0431 02/27/18 1500 02/28/18 0506  NA 138   < > 133* 133* 134* 133* 134*  K 4.2   < > 3.6 3.7 3.8 4.0 4.1  CL 102   < > 88* 84* 85* 90* 100  CO2 17*   < > 22 29 35* 29 24  GLUCOSE 71   < > 181* 169* 140* 195* 169*  BUN 87*   < > 52* 44* 39* 36* 32*  CREATININE 4.92*   < > 3.31* 2.65* 2.07* 1.98* 1.60*  CALCIUM 7.6*   < > 6.5* 6.3* 6.4* 7.2* 7.7*  MG 2.2  --  1.9  --  1.8  --  2.3  PHOS 5.4*   < > 4.5 3.5 1.8* 3.4 1.1*   < > = values in this interval not displayed.   GFR: Estimated Creatinine Clearance: 27.4 mL/min (A) (by C-G formula based on SCr of 1.6 mg/dL (H)). Recent Labs  Lab 02/15/2018 1507 02/21/2018 1755 02/08/2018 2113 02/25/18 0034 02/25/18 0515 02/26/18 0526 02/27/18 0431   WBC 27.5*  --  25.2*  --  25.4* 15.6* 12.6*  LATICACIDVEN 7.8* 7.6* 5.3* 5.1*  --   --   --     Liver Function Tests: Recent Labs  Lab 02/11/2018 1507 02/05/2018 2113 02/25/18 0515  02/26/18 0526 02/26/18 1600 02/27/18 0431 02/27/18 1500 02/28/18 0506  AST 644* 972* 1,162*  --  1,005*  --   --   --   --   ALT 295* 450* 597*  --  670*  --   --   --   --   ALKPHOS 93 87 94  --  90  --   --   --   --   BILITOT 2.6* 2.5* 2.6*  --  3.1*  --   --   --   --   PROT 7.3 6.6 6.9  --  6.4*  --   --   --   --   ALBUMIN 3.8 3.4* 3.5   < > 2.9*  2.9* 2.9* 2.6* 2.6* 2.6*   < > = values in this interval not displayed.   Recent Labs  Lab 02/12/2018 1507  LIPASE 26   No results for input(s): AMMONIA in the last 168 hours.  ABG    Component Value Date/Time   PHART 7.396 02/25/2018 0644   PCO2ART 32.5 02/25/2018 0644   PO2ART 127.0 (H) 02/25/2018 0644   HCO3 20.1 02/25/2018 0644   TCO2 21 (L) 02/25/2018 0644   ACIDBASEDEF 4.0 (H) 02/25/2018 0644   O2SAT 99.0 02/25/2018 0644     Coagulation Profile: No results for input(s): INR, PROTIME in the last 168 hours.  Cardiac Enzymes: Recent Labs  Lab 02/07/2018 1507 02/11/2018 2113 02/25/18 0300 02/25/18 0820  TROPONINI 0.32* 0.52* 0.70* 1.09*    HbA1C: Hgb A1c MFr Bld  Date/Time Value Ref Range Status  10/23/2017 03:06 PM 7.9 (H) 4.8 - 5.6 % Final    Comment:    (NOTE) Pre diabetes:          5.7%-6.4% Diabetes:              >6.4% Glycemic control for   <7.0% adults with diabetes   09/17/2010 05:27 AM 6.3 (H) <5.7 % Final    Comment:    (NOTE)  According to the ADA Clinical Practice Recommendations for 2011, when HbA1c is used as a screening test:  >=6.5%   Diagnostic of Diabetes Mellitus           (if abnormal result is confirmed) 5.7-6.4%   Increased risk of developing Diabetes Mellitus References:Diagnosis and Classification of Diabetes  [place],Diabetes XVQM,0867,61(PJKDT 1):S62-S69 and Standards of Medical Care in         Diabetes - 2011,Diabetes OIZT,2458,09 (Suppl 1):S11-S61.    CBG: Recent Labs  Lab 02/27/18 1202 02/27/18 1458 02/27/18 1924 02/27/18 2313 02/28/18 0335  GLUCAP 133* 168* 113* 107* 154*    Review of Systems:   Unable to obtain due to patients mental status  Past Medical History  She,  has a past medical history of Acid reflux, Atrial fibrillation ([place]), Cardiomyopathy ([place]), Chronic anticoagulation, Chronic combined systolic and diastolic CHF (congestive heart failure) ([place]), CKD (chronic kidney disease), stage IV ([place]), Diabetes mellitus type 2 in obese [facility]), Diverticular hemorrhage (2011), Diverticulosis (2011), GI bleeding (08/03/2011), Gout, medication noncompliance, Hypercholesterolemia, Hyperglycemia, Hypertension, Internal hemorrhoids (2011), Lower extremity edema, Seasonal allergies, and Thrombocytopenia due to drugs.   Surgical History    Past Surgical History:  Procedure Laterality Date  . ADENOIDECTOMY    . APPENDECTOMY    . BUNIONECTOMY    . CATARACT EXTRACTION    . CESAREAN SECTION    . ESOPHAGOGASTRODUODENOSCOPY  08/20/09   small hiatal hernia/mild gastritis  . ileocolonoscopy  08/20/09   normal terminal ileum/pancolonic diverticula/no polyps/benign colon mucosa  . JOINT REPLACEMENT     Total right knee 2007  . LAPAROTOMY  09/22/2010   Procedure: EXPLORATORY LAPAROTOMY;  Surgeon: [name];  Location: [facility];  Service: General;  Laterality: N/A;  Lysis of Adhesions  . TONSILLECTOMY    . TUBAL LIGATION       Social History   reports that she has never smoked. She has never used smokeless tobacco. She reports that she does not drink alcohol or use drugs.   Family History   Her family history includes Diabetes in her mother; Heart disease in her mother.   Allergies Allergies  Allergen Reactions  . Codeine Nausea And Vomiting  . Sulfa Antibiotics Nausea And  Vomiting  . Sulfur Nausea And Vomiting     Home Medications  Prior to Admission medications   Medication Sig Start Date End Date Taking? Authorizing Provider  acetaminophen (TYLENOL) 325 MG tablet Take 2 tablets (650 mg total) by mouth every 4 (four) hours as needed for headache or mild pain. 10/27/17  Yes Emokpae, Courage, MD  allopurinol (ZYLOPRIM) 100 MG tablet Take 100 mg by mouth daily.   Yes [provider]  amitriptyline (ELAVIL) 25 MG tablet Take 25 mg by mouth at bedtime as needed for sleep.  02/14/18  Yes [provider]  apixaban (ELIQUIS) 2.5 MG TABS tablet Take 1 tablet (2.5 mg total) by mouth 2 (two) times daily. 10/27/17  Yes Emokpae, Courage, MD  Artificial Tear Ointment (DRY EYES OP) Place 1 drop into both eyes as needed (dry eyes).   Yes [provider]  ferrous sulfate 325 (65 FE) MG tablet Take 325 mg by mouth 2 (two) times daily with a meal.    Yes [provider]  fluticasone (FLONASE) 50 MCG/ACT nasal spray Place 1 spray into both nostrils daily.   Yes [provider]  furosemide (LASIX) 40 MG tablet Take 1 tablet (40 mg total) by mouth daily. Patient taking differently: Take 20 mg by mouth  daily.  10/27/17  Yes Emokpae, Courage, MD  glimepiride (AMARYL) 2 MG tablet Take 1 tablet (2 mg total) by mouth daily with breakfast. 10/28/17  Yes Emokpae, Courage, MD  levalbuterol (XOPENEX) 1.25 MG/3ML nebulizer solution Take 1.25 mg by nebulization every 4 (four) hours as needed for wheezing. 09/10/17  Yes Valentina Shaggy, MD  loratadine (CLARITIN) 10 MG tablet Take 10 mg by mouth daily.   Yes [provider]  metoprolol succinate (TOPROL-XL) 25 MG 24 hr tablet Take 25 mg by mouth every morning.   Yes [provider]  montelukast (SINGULAIR) 10 MG tablet Take 10 mg by mouth at bedtime.    Yes [provider]  Multiple Vitamin (MULTIVITAMIN WITH MINERALS) TABS tablet Take 1 tablet by mouth daily.   Yes [provider]  omeprazole (PRILOSEC) 20 MG capsule Take 1 capsule (20 mg total) by mouth daily. Patient taking differently: Take 20 mg by mouth every morning.  06/19/16  Yes Maczis, Barth Kirks, PA-C  potassium chloride SA (K-DUR,KLOR-CON) 20 MEQ tablet Take 1 tablet (20 mEq total) by mouth daily. 04/01/16  Yes Isaac Bliss, Rayford Halsted, MD  pravastatin (PRAVACHOL) 40 MG tablet Take 40 mg by mouth every evening.    Yes [provider]  sodium chloride (OCEAN) 0.65 % SOLN nasal spray Place 1 spray into both nostrils as needed for congestion.   Yes [provider]  Vitamin D, Ergocalciferol, (DRISDOL) 50000 units CAPS capsule Take 50,000 Units by mouth every 30 (thirty) days. Administered on the 1st of each month   Yes [provider]         Asencion Noble, M.D. PGY1 Pager [phone] 02/28/2018 6:42 AM

## 2018-03-01 ENCOUNTER — Inpatient Hospital Stay (HOSPITAL_COMMUNITY): Payer: Medicare Other

## 2018-03-01 ENCOUNTER — Other Ambulatory Visit: Payer: Self-pay

## 2018-03-01 LAB — RENAL FUNCTION PANEL
Albumin: 2.7 g/dL — ABNORMAL LOW (ref 3.5–5.0)
Albumin: 2.8 g/dL — ABNORMAL LOW (ref 3.5–5.0)
Anion gap: 10 (ref 5–15)
Anion gap: 15 (ref 5–15)
BUN: 30 mg/dL — ABNORMAL HIGH (ref 8–23)
BUN: 38 mg/dL — ABNORMAL HIGH (ref 8–23)
CO2: 23 mmol/L (ref 22–32)
CO2: 24 mmol/L (ref 22–32)
Calcium: 7.9 mg/dL — ABNORMAL LOW (ref 8.9–10.3)
Calcium: 8 mg/dL — ABNORMAL LOW (ref 8.9–10.3)
Chloride: 100 mmol/L (ref 98–111)
Chloride: 93 mmol/L — ABNORMAL LOW (ref 98–111)
Creatinine, Ser: 1.51 mg/dL — ABNORMAL HIGH (ref 0.44–1.00)
Creatinine, Ser: 1.81 mg/dL — ABNORMAL HIGH (ref 0.44–1.00)
GFR calc Af Amer: 36 mL/min — ABNORMAL LOW (ref >60)
GFR calc Af Amer: 29 mL/min — ABNORMAL LOW (ref >60)
GFR calc non Af Amer: 31 mL/min — ABNORMAL LOW (ref >60)
GFR calc non Af Amer: 25 mL/min — ABNORMAL LOW (ref >60)
Glucose, Bld: 215 mg/dL — ABNORMAL HIGH (ref 70–99)
Glucose, Bld: 281 mg/dL — ABNORMAL HIGH (ref 70–99)
Phosphorus: 2.2 mg/dL — ABNORMAL LOW (ref 2.5–4.6)
Phosphorus: 4.1 mg/dL (ref 2.5–4.6)
Potassium: 4.2 mmol/L (ref 3.5–5.1)
Potassium: 4.4 mmol/L (ref 3.5–5.1)
Sodium: 133 mmol/L — ABNORMAL LOW (ref 135–145)
Sodium: 132 mmol/L — ABNORMAL LOW (ref 135–145)

## 2018-03-01 LAB — CBC
HCT: 42.4 % (ref 36.0–46.0)
Hemoglobin: 13.2 g/dL (ref 12.0–15.0)
MCH: 28.7 pg (ref 26.0–34.0)
MCHC: 31.1 g/dL (ref 30.0–36.0)
MCV: 92.2 fL (ref 80.0–100.0)
Platelets: 91 10*3/uL — ABNORMAL LOW (ref 150–400)
RBC: 4.6 MIL/uL (ref 3.87–5.11)
RDW: 19.3 % — ABNORMAL HIGH (ref 11.5–15.5)
WBC: 13.8 10*3/uL — ABNORMAL HIGH (ref 4.0–10.5)
nRBC: 2.6 % — ABNORMAL HIGH (ref 0.0–0.2)

## 2018-03-01 LAB — GLUCOSE, CAPILLARY
Glucose-Capillary: 166 mg/dL — ABNORMAL HIGH (ref 70–99)
Glucose-Capillary: 167 mg/dL — ABNORMAL HIGH (ref 70–99)
Glucose-Capillary: 180 mg/dL — ABNORMAL HIGH (ref 70–99)
Glucose-Capillary: 185 mg/dL — ABNORMAL HIGH (ref 70–99)
Glucose-Capillary: 198 mg/dL — ABNORMAL HIGH (ref 70–99)
Glucose-Capillary: 215 mg/dL — ABNORMAL HIGH (ref 70–99)

## 2018-03-01 LAB — HEPARIN LEVEL (UNFRACTIONATED): Heparin Unfractionated: 0.66 IU/mL (ref 0.30–0.70)

## 2018-03-01 LAB — MAGNESIUM: Magnesium: 2.4 mg/dL (ref 1.7–2.4)

## 2018-03-01 LAB — APTT: aPTT: 80 seconds — ABNORMAL HIGH (ref 24–36)

## 2018-03-01 MED ORDER — SODIUM PHOSPHATES 45 MMOLE/15ML IV SOLN
20.0000 mmol | Freq: Once | INTRAVENOUS | Status: AC
  Administered 2018-03-01: 20 mmol via INTRAVENOUS
  Filled 2018-03-01: qty 6.67

## 2018-03-01 MED ORDER — PANTOPRAZOLE SODIUM 40 MG PO PACK
40.0000 mg | PACK | ORAL | Status: DC
  Administered 2018-03-01 – 2018-03-07 (×7): 40 mg
  Filled 2018-03-01 (×7): qty 20

## 2018-03-01 MED ORDER — VANCOMYCIN HCL IN DEXTROSE 1-5 GM/200ML-% IV SOLN
1000.0000 mg | INTRAVENOUS | Status: DC
  Administered 2018-03-01: 1000 mg via INTRAVENOUS
  Filled 2018-03-01: qty 200

## 2018-03-01 MED ORDER — CEFAZOLIN SODIUM-DEXTROSE 1-4 GM/50ML-% IV SOLN
1.0000 g | Freq: Two times a day (BID) | INTRAVENOUS | Status: DC
  Administered 2018-03-01: 1 g via INTRAVENOUS
  Filled 2018-03-01 (×2): qty 50

## 2018-03-01 MED ORDER — LEVOTHYROXINE SODIUM 25 MCG PO TABS
25.0000 ug | ORAL_TABLET | Freq: Every day | ORAL | Status: DC
  Administered 2018-03-01 – 2018-03-09 (×8): 25 ug
  Filled 2018-03-01 (×9): qty 1

## 2018-03-01 NOTE — Progress Notes (Signed)
[place] for IV Heparin Indication: atrial fibrillation  Patient Measurements: Height: 5\' 5"  (165.1 cm) Weight: 185 lb 13.6 oz (84.3 kg) IBW/kg (Calculated) : 57 Heparin Dosing Weight: 79 kg  Vital Signs: Temp: 97 F (36.1 C) (01/25 0930) Temp Source: Bladder (01/25 0800) BP: 138/79 (01/25 0900) Pulse Rate: 86 (01/25 0930)  Labs: Recent Labs    02/27/18 0431  02/28/18 0027 02/28/18 0505 02/28/18 0506 02/28/18 1600 03/01/18 0311  HGB 12.2  --   --  12.5  --   --  13.2  HCT 40.1  --   --  40.5  --   --  42.4  PLT 119*  --   --  104*  --   --  91*  APTT 51*   < > 69*  --  97*  --  80*  HEPARINUNFRC 1.42*  --  1.06*  --  1.09*  --  0.66  CREATININE 2.07*   < >  --   --  1.60* 1.42* 1.51*   < > = values in this interval not displayed.    Medications:  Infusions:  .  prismasol BGK 4/2.5 400 mL/hr at 02/28/18 2231  .  prismasol BGK 4/2.5 400 mL/hr at 02/28/18 0951  . sodium chloride 10 mL/hr at 03/01/18 0917  .  ceFAZolin (ANCEF) IV 2 g (03/01/18 0918)  . heparin 800 Units/hr (03/01/18 0915)  . prismasol BGK 4/2.5 1,500 mL/hr at 03/01/18 0602  . sodium phosphate  Dextrose 5% IVPB 20 mmol (03/01/18 0924)  . vancomycin Stopped (02/28/18 1331)    Assessment: 85 year old female transitioned from oral Eliquis to IV Heparin. Night shift RN noted appearance of blood in stool 1/21 while cleaning up patient after supra-therapeutic aPTT > now resolved.  APTT and HL are both therapeutic. Will obtain one more day of aPTTs and then will discontinue and use only heparin levels.    Goal of Therapy:  Heparin level 0.3-0.7 units/ml aPTT 66-102 seconds Monitor platelets by anticoagulation protocol: Yes    Plan:  Continue heparin 800 units/hr  F/u 8 hr PTT and HL  Monitor daily aPTT and HL until correlating.    Harvel Quale 03/01/2018 9:40 AM

## 2018-03-01 NOTE — Progress Notes (Signed)
NAME:  [name] [name], MRN:  [id number], DOB:  1930-06-24, LOS: 5 ADMISSION DATE:  03/05/2018, CONSULTATION DATE:  02/23/2018 REFERRING MD:  Martin Majestic - [facility]  CHIEF COMPLAINT:  SOB   Brief History   This is an 85 year old female who presented to [facility] with SOB, cough, weakness and found to have multiple metabolic derangements including AoCKD with Scr 6, BUN 445, K 7.2. Received temporizing measures and transferred to [facility] for further management.   Past Medical History  HTN, HLD, CHF, A.fib, CKD, DM, diverticulosis, GERD.  Significant Hospital Events   1/20 >Admitted to ICU  Consults:  Nephrology Cardiology  Procedures:  ETT 1/20 >  R IJ HD cath 1/20 >   Significant Diagnostic Tests:  Echo 1/20 > EF 20 to 25%, severe RV hypokinesis  Micro Data:  Blood 1/20 > NGTD Sputum 1/20 > Staphylococcus aureus and klebsiella pneumonia Urine 1/20 > <10K colonies, insignificant  Antimicrobials:  Ancef Vancomycin  Interim history/subjective:  Remains on CRRT, Vent support.  Last sedation on 1/24.  Objective   Blood pressure 138/74, pulse (!) 104, temperature (!) 97.2 F (36.2 C), resp. rate (!) 32, height 5\' 5"  (1.651 m), weight 84.3 kg, SpO2 100 %.    Vent Mode: CPAP;PSV FiO2 (%):  [40 %] 40 % Set Rate:  [18 bmp] 18 bmp Vt Set:  [450 mL] 450 mL PEEP:  [5 cmH20] 5 cmH20 Pressure Support:  [10 cmH20] 10 cmH20 Plateau Pressure:  [15 cmH20-22 cmH20] 20 cmH20   Intake/Output Summary (Last 24 hours) at 03/01/2018 7824 Last data filed at 03/01/2018 0700 Gross per 24 hour  Intake 2180.5 ml  Output 5173 ml  Net -2992.5 ml   Filed Weights   02/27/18 0200 02/28/18 0500 03/01/18 0500  Weight: 85.7 kg 82.9 kg 84.3 kg    Examination:  General - somnolent Eyes - pupils reactive ENT - ETT in place Cardiac - regular rate/rhythm, no murmur Chest - equal breath sounds b/l, no wheezing or rales Abdomen - soft, non tender, + bowel sounds Extremities - 1+ edema Skin - no rashes Neuro - opens eyes  with stimulation, doesn't follow commands   Assessment & Plan:   Acute hypoxic respiratory failure with PNA with MRSA and Klebsiella and pulmonary edema. Plan - pressure support - mental status barrier to extubation - f/u CXR - continue ancef, vancomycin  Septic shock resolved  Acute systolic CHF. A fib with RVR. Hx of HTN, HLD. Plan -  Acute renal failure with ATN. Plan - plan to transition off CRRT 2/35  Acute metabolic encephalopathy. Discussion: Likely from renal failure, but mental status not improving with dialysis. Plan - CT head  Elevated LFTs in setting of shock. Plan - f/u LFTs  Diabetes mellitus. Plan - SSI  Hypothyroidism. Plan - add synthroid  Best practice:  Diet: tube feeds DVT prophylaxis: SCDs, heparin GI prophylaxis: PPI Mobility: Bedrest Code Status: DNR Family Communication: updated family at bedside  Labs   CBC: Recent Labs  Lab 02/23/2018 1507  02/25/18 0515 02/26/18 0526 02/27/18 0431 02/28/18 0505 03/01/18 0311  WBC 27.5*   < > 25.4* 15.6* 12.6* 14.9* 13.8*  NEUTROABS 23.3*  --   --   --   --   --   --   HGB 12.0   < > 12.5 12.6 12.2 12.5 13.2  HCT 42.5   < > 40.5 40.1 40.1 40.5 42.4  MCV 103.2*   < > 93.3 94.1 93.7 92.7 92.2  PLT  159   < > 160 137* 119* 104* 91*   < > = values in this interval not displayed.    Basic Metabolic Panel: Recent Labs  Lab 02/25/18 0515  02/26/18 0526  02/27/18 0431 02/27/18 1500 02/28/18 0506 02/28/18 1600 03/01/18 0311  NA 138   < > 133*   < > 134* 133* 134* 134* 133*  K 4.2   < > 3.6   < > 3.8 4.0 4.1 4.0 4.2  CL 102   < > 88*   < > 85* 90* 100 98 100  CO2 17*   < > 22   < > 35* 29 24 25 23   GLUCOSE 71   < > 181*   < > 140* 195* 169* 196* 215*  BUN 87*   < > 52*   < > 39* 36* 32* 29* 30*  CREATININE 4.92*   < > 3.31*   < > 2.07* 1.98* 1.60* 1.42* 1.51*  CALCIUM 7.6*   < > 6.5*   < > 6.4* 7.2* 7.7* 7.6* 7.9*  MG 2.2  --  1.9  --  1.8  --  2.3  --  2.4  PHOS 5.4*   < > 4.5   < >  1.8* 3.4 1.1* 2.8 2.2*   < > = values in this interval not displayed.   Liver Function Tests: Recent Labs  Lab 02/10/2018 1507 02/11/2018 2113 02/25/18 0515  02/26/18 0526  02/27/18 0431 02/27/18 1500 02/28/18 0506 02/28/18 1600 03/01/18 0311  AST 644* 972* 1,162*  --  1,005*  --   --   --   --   --   --   ALT 295* 450* 597*  --  670*  --   --   --   --   --   --   ALKPHOS 93 87 94  --  90  --   --   --   --   --   --   BILITOT 2.6* 2.5* 2.6*  --  3.1*  --   --   --   --   --   --   PROT 7.3 6.6 6.9  --  6.4*  --   --   --   --   --   --   ALBUMIN 3.8 3.4* 3.5   < > 2.9*  2.9*   < > 2.6* 2.6* 2.6* 2.6* 2.7*   < > = values in this interval not displayed.    ABG    Component Value Date/Time   PHART 7.396 02/25/2018 0644   PCO2ART 32.5 02/25/2018 0644   PO2ART 127.0 (H) 02/25/2018 0644   HCO3 20.1 02/25/2018 0644   TCO2 21 (L) 02/25/2018 0644   ACIDBASEDEF 4.0 (H) 02/25/2018 0644   O2SAT 99.0 02/25/2018 0644     CBG: Recent Labs  Lab 02/28/18 1125 02/28/18 1536 02/28/18 1947 02/28/18 2339 03/01/18 0420  GLUCAP 172* 164* 136* 166* 185*    CC time 32 minutes  Chesley Mires, MD [place] 03/01/2018, 8:18 AM

## 2018-03-01 NOTE — Progress Notes (Signed)
Patient ID: [name] [name], female   DOB: [birth year], 85 y.o.   MRN: [id number]  [facility] Progress Note   Assessment/ Plan:   1. Acute kidney Injury on chronic kidney disease stage IV-V: Remains anuric and without any renal recovery-likely progressed to ESRD.  Plan to discontinue CRRT at this time given current labs/volume status and monitor labs with option of starting intermittent hemodialysis as indicated/3 days a week. 2.  Hyponatremia: Secondary to free water excretion defect with acute kidney injury-stable on CRRT/ultrafiltration. 3.  Acute hypoxic respiratory failure: Remains on ventilator support, continue UF with CRRT. 4.  Septic versus cardiogenic shock: Source identification pending, weaned off of pressors 5.  Hypophosphatemia: Secondary to CRRT losses/prescription-will order for replacement of phosphorus.  Subjective:   No acute events noted overnight.   Objective:   BP 133/72   Pulse 80   Temp (!) 97.2 F (36.2 C) (Bladder)   Resp (!) 25   Ht 5' 5"  (1.651 m)   Wt 84.3 kg   SpO2 100%   BMI 30.93 kg/m   Intake/Output Summary (Last 24 hours) at 03/01/2018 0737 Last data filed at 03/01/2018 0700 Gross per 24 hour  Intake 2254 ml  Output 5359 ml  Net -3105 ml   Weight change: 1.4 kg  Physical Exam: Gen: Intubated, somnolent.  Daughter Silva Bandy and granddaughter Lexine Baton at bedside CVS: Regular rhythm, normal rate, S1 and S2 normal Resp: Anteriorly clear to auscultation, no distinct rales or rhonchi Abd: Soft, obese, nontender Ext: Trace edema over dependent areas/upper extremities, no lower extremity edema  Imaging: No results found.  Labs: BMET Recent Labs  Lab 02/26/18 0526 02/26/18 1600 02/27/18 0431 02/27/18 1500 02/28/18 0506 02/28/18 1600 03/01/18 0311  NA 133* 133* 134* 133* 134* 134* 133*  K 3.6 3.7 3.8 4.0 4.1 4.0 4.2  CL 88* 84* 85* 90* 100 98 100  CO2 22 29 35* 29 24 25 23   GLUCOSE 181* 169* 140* 195* 169* 196* 215*  BUN 52*  44* 39* 36* 32* 29* 30*  CREATININE 3.31* 2.65* 2.07* 1.98* 1.60* 1.42* 1.51*  CALCIUM 6.5* 6.3* 6.4* 7.2* 7.7* 7.6* 7.9*  PHOS 4.5 3.5 1.8* 3.4 1.1* 2.8 2.2*   CBC Recent Labs  Lab 02/16/2018 1507  02/26/18 0526 02/27/18 0431 02/28/18 0505 03/01/18 0311  WBC 27.5*   < > 15.6* 12.6* 14.9* 13.8*  NEUTROABS 23.3*  --   --   --   --   --   HGB 12.0   < > 12.6 12.2 12.5 13.2  HCT 42.5   < > 40.1 40.1 40.5 42.4  MCV 103.2*   < > 94.1 93.7 92.7 92.2  PLT 159   < > 137* 119* 104* 91*   < > = values in this interval not displayed.    Medications:    . calcium carbonate (dosed in mg elemental calcium)  1,000 mg of elemental calcium Per Tube TID  . chlorhexidine gluconate (MEDLINE KIT)  15 mL Mouth Rinse BID  . [START ON 03/03/2018] Chlorhexidine Gluconate Cloth  6 each Topical Daily  . feeding supplement (PRO-STAT SUGAR FREE 64)  30 mL Per Tube BID  . feeding supplement (VITAL HIGH PROTEIN)  1,000 mL Per Tube Q24H  . fentaNYL (SUBLIMAZE) injection  50 mcg Intravenous Once  . insulin aspart  0-9 Units Subcutaneous Q4H  . mouth rinse  15 mL Mouth Rinse 10 times per day  . mupirocin ointment  1 application Nasal BID  . pantoprazole (PROTONIX)  IV  40 mg Intravenous Daily  . sodium chloride flush  10-40 mL Intracatheter Q12H   Elmarie Shiley, MD 03/01/2018, 7:37 AM

## 2018-03-01 NOTE — Progress Notes (Signed)
Patient transported to CT and returned to 1W78 without complications. Vital signs at this time. RT will continue to monitor.

## 2018-03-01 NOTE — Progress Notes (Signed)
Chest pt not performed per RN request due to CRRT. RT will attempt at a later time. RT will continue to monitor.

## 2018-03-02 ENCOUNTER — Inpatient Hospital Stay (HOSPITAL_COMMUNITY): Payer: Medicare Other

## 2018-03-02 DIAGNOSIS — I4821 Permanent atrial fibrillation: Secondary | ICD-10-CM

## 2018-03-02 LAB — GLUCOSE, CAPILLARY
Glucose-Capillary: 166 mg/dL — ABNORMAL HIGH (ref 70–99)
Glucose-Capillary: 209 mg/dL — ABNORMAL HIGH (ref 70–99)
Glucose-Capillary: 224 mg/dL — ABNORMAL HIGH (ref 70–99)
Glucose-Capillary: 229 mg/dL — ABNORMAL HIGH (ref 70–99)
Glucose-Capillary: 242 mg/dL — ABNORMAL HIGH (ref 70–99)
Glucose-Capillary: 255 mg/dL — ABNORMAL HIGH (ref 70–99)

## 2018-03-02 LAB — CULTURE, BLOOD (ROUTINE X 2)
Culture: NO GROWTH
Culture: NO GROWTH

## 2018-03-02 LAB — CBC
HCT: 43.8 % (ref 36.0–46.0)
Hemoglobin: 14.1 g/dL (ref 12.0–15.0)
MCH: 28.8 pg (ref 26.0–34.0)
MCHC: 32.2 g/dL (ref 30.0–36.0)
MCV: 89.4 fL (ref 80.0–100.0)
Platelets: 90 10*3/uL — ABNORMAL LOW (ref 150–400)
RBC: 4.9 MIL/uL (ref 3.87–5.11)
RDW: 19.3 % — ABNORMAL HIGH (ref 11.5–15.5)
WBC: 10.5 10*3/uL (ref 4.0–10.5)
nRBC: 3.1 % — ABNORMAL HIGH (ref 0.0–0.2)

## 2018-03-02 LAB — RENAL FUNCTION PANEL
Albumin: 2.7 g/dL — ABNORMAL LOW (ref 3.5–5.0)
Anion gap: 18 — ABNORMAL HIGH (ref 5–15)
BUN: 68 mg/dL — ABNORMAL HIGH (ref 8–23)
CO2: 19 mmol/L — ABNORMAL LOW (ref 22–32)
Calcium: 8.1 mg/dL — ABNORMAL LOW (ref 8.9–10.3)
Chloride: 95 mmol/L — ABNORMAL LOW (ref 98–111)
Creatinine, Ser: 2.88 mg/dL — ABNORMAL HIGH (ref 0.44–1.00)
GFR calc Af Amer: 17 mL/min — ABNORMAL LOW (ref >60)
GFR calc non Af Amer: 14 mL/min — ABNORMAL LOW (ref >60)
Glucose, Bld: 245 mg/dL — ABNORMAL HIGH (ref 70–99)
Phosphorus: 4.1 mg/dL (ref 2.5–4.6)
Potassium: 5.2 mmol/L — ABNORMAL HIGH (ref 3.5–5.1)
Sodium: 132 mmol/L — ABNORMAL LOW (ref 135–145)

## 2018-03-02 LAB — HEPATIC FUNCTION PANEL
ALT: 23 U/L (ref 0–44)
AST: 169 U/L — ABNORMAL HIGH (ref 15–41)
Albumin: 2.7 g/dL — ABNORMAL LOW (ref 3.5–5.0)
Alkaline Phosphatase: 109 U/L (ref 38–126)
Bilirubin, Direct: 0.9 mg/dL — ABNORMAL HIGH (ref 0.0–0.2)
Indirect Bilirubin: 1.4 mg/dL — ABNORMAL HIGH (ref 0.3–0.9)
Total Bilirubin: 2.3 mg/dL — ABNORMAL HIGH (ref 0.3–1.2)
Total Protein: 7 g/dL (ref 6.5–8.1)

## 2018-03-02 LAB — APTT: aPTT: 82 seconds — ABNORMAL HIGH (ref 24–36)

## 2018-03-02 LAB — MAGNESIUM: Magnesium: 2.3 mg/dL (ref 1.7–2.4)

## 2018-03-02 LAB — HEPARIN LEVEL (UNFRACTIONATED): Heparin Unfractionated: 0.55 IU/mL (ref 0.30–0.70)

## 2018-03-02 MED ORDER — SODIUM ZIRCONIUM CYCLOSILICATE 5 G PO PACK
5.0000 g | PACK | Freq: Once | ORAL | Status: AC
  Administered 2018-03-02: 5 g via ORAL
  Filled 2018-03-02: qty 1

## 2018-03-02 MED ORDER — METOPROLOL TARTRATE 12.5 MG HALF TABLET
25.0000 mg | ORAL_TABLET | Freq: Two times a day (BID) | ORAL | Status: DC
  Administered 2018-03-02 – 2018-03-04 (×2): 25 mg via ORAL
  Filled 2018-03-02 (×4): qty 2

## 2018-03-02 MED ORDER — SODIUM CHLORIDE 0.9 % IV SOLN
1.0000 g | INTRAVENOUS | Status: AC
  Administered 2018-03-02 – 2018-03-04 (×3): 1 g via INTRAVENOUS
  Filled 2018-03-02 (×3): qty 1

## 2018-03-02 MED ORDER — ACETAMINOPHEN 325 MG PO TABS
650.0000 mg | ORAL_TABLET | Freq: Four times a day (QID) | ORAL | Status: DC | PRN
  Administered 2018-03-02: 650 mg
  Filled 2018-03-02: qty 2

## 2018-03-02 MED ORDER — METOPROLOL TARTRATE 12.5 MG HALF TABLET
12.5000 mg | ORAL_TABLET | Freq: Two times a day (BID) | ORAL | Status: DC
  Administered 2018-03-02: 12.5 mg via ORAL
  Filled 2018-03-02: qty 1

## 2018-03-02 NOTE — Progress Notes (Signed)
Progress Note  Patient Name: [name] [name] Date of Encounter: 03/02/2018  Primary Cardiologist: Yolonda Kida, MD    Subjective   Patient is intubated and sedated.   Inpatient Medications    Scheduled Meds: . calcium carbonate (dosed in mg elemental calcium)  1,000 mg of elemental calcium Per Tube TID  . chlorhexidine gluconate (MEDLINE KIT)  15 mL Mouth Rinse BID  . [START ON 03/03/2018] Chlorhexidine Gluconate Cloth  6 each Topical Daily  . feeding supplement (PRO-STAT SUGAR FREE 64)  30 mL Per Tube BID  . feeding supplement (VITAL HIGH PROTEIN)  1,000 mL Per Tube Q24H  . insulin aspart  0-9 Units Subcutaneous Q4H  . levothyroxine  25 mcg Per Tube Q0600  . mouth rinse  15 mL Mouth Rinse 10 times per day  . metoprolol tartrate  12.5 mg Oral BID  . pantoprazole sodium  40 mg Per Tube Q24H  . sodium chloride flush  10-40 mL Intracatheter Q12H  . sodium zirconium cyclosilicate  5 g Oral Once   Continuous Infusions: . sodium chloride 10 mL/hr at 03/01/18 1900  . heparin 800 Units/hr (03/01/18 1700)  . vancomycin Stopped (03/01/18 1550)   PRN Meds: acetaminophen, fentaNYL, fentaNYL (SUBLIMAZE) injection, heparin, midazolam, sodium chloride, sodium chloride flush   Vital Signs    Vitals:   03/02/18 0749 03/02/18 0800 03/02/18 0830 03/02/18 0900  BP: (!) 152/60 (!) 153/101  (!) 159/70  Pulse: (!) 147 78 (!) 49 (!) 114  Resp: (!) 38 (!) 34 (!) 33 (!) 34  Temp:  (!) 102.2 F (39 C) (!) 102 F (38.9 C) (!) 101.7 F (38.7 C)  TempSrc:      SpO2: 100% 97% 97% 97%  Weight:      Height:        Intake/Output Summary (Last 24 hours) at 03/02/2018 0951 Last data filed at 03/02/2018 0900 Gross per 24 hour  Intake 2462.32 ml  Output 1522 ml  Net 940.32 ml   Last 3 Weights 03/02/2018 03/01/2018 02/28/2018  Weight (lbs) 173 lb 15.1 oz 185 lb 13.6 oz 182 lb 12.2 oz  Weight (kg) 78.9 kg 84.3 kg 82.9 kg      Telemetry    Afib with mildly elevated HR - Personally  Reviewed  Physical Exam   GEN:  Intubated and sedated  Neck: No JVD Cardiac:  irregular and tachycardic  Respiratory:  CTA anteriorly GI: Mildly distended MS: No edema Neuro:   Intubated and sedated  Labs    Chemistry Recent Labs  Lab 02/25/18 0515  02/26/18 0526  03/01/18 0311 03/01/18 1615 03/02/18 0408  NA 138   < > 133*   < > 133* 132* 132*  K 4.2   < > 3.6   < > 4.2 4.4 5.2*  CL 102   < > 88*   < > 100 93* 95*  CO2 17*   < > 22   < > 23 24 19*  GLUCOSE 71   < > 181*   < > 215* 281* 245*  BUN 87*   < > 52*   < > 30* 38* 68*  CREATININE 4.92*   < > 3.31*   < > 1.51* 1.81* 2.88*  CALCIUM 7.6*   < > 6.5*   < > 7.9* 8.0* 8.1*  PROT 6.9  --  6.4*  --   --   --  7.0  ALBUMIN 3.5   < > 2.9*  2.9*   < >  2.7* 2.8* 2.7*  2.7*  AST 1,162*  --  1,005*  --   --   --  169*  ALT 597*  --  670*  --   --   --  23  ALKPHOS 94  --  90  --   --   --  109  BILITOT 2.6*  --  3.1*  --   --   --  2.3*  GFRNONAA 7*   < > 12*   < > 31* 25* 14*  GFRAA 9*   < > 14*   < > 36* 29* 17*  ANIONGAP 19*   < > 23*   < > 10 15 18*   < > = values in this interval not displayed.     Hematology Recent Labs  Lab 02/28/18 0505 03/01/18 0311 03/02/18 0408  WBC 14.9* 13.8* 10.5  RBC 4.37 4.60 4.90  HGB 12.5 13.2 14.1  HCT 40.5 42.4 43.8  MCV 92.7 92.2 89.4  MCH 28.6 28.7 28.8  MCHC 30.9 31.1 32.2  RDW 18.7* 19.3* 19.3*  PLT 104* 91* 90*    Cardiac Enzymes Recent Labs  Lab 03/03/2018 1507 02/18/2018 2113 02/25/18 0300 02/25/18 0820  TROPONINI 0.32* 0.52* 0.70* 1.09*    BNP Recent Labs  Lab 02/25/2018 1458  BNP 2,594.0*       Radiology    Ct Head Wo Contrast  Result Date: 03/01/2018 CLINICAL DATA:  Altered mental status. EXAM: CT HEAD WITHOUT CONTRAST TECHNIQUE: Contiguous axial images were obtained from the base of the skull through the vertex without intravenous contrast. COMPARISON:  03/30/2016 FINDINGS: Brain: Chronic generalized atrophy. No sign of old or acute focal  infarction, mass lesion, hemorrhage, hydrocephalus or extra-axial collection. Vascular: There is atherosclerotic calcification of the major vessels at the base of the brain. Skull: Negative Sinuses/Orbits: Clear/normal Other: None IMPRESSION: No acute or traumatic finding. Chronic generalized atrophy. Electronically Signed   By: Mark  Shogry M.D.   On: 03/01/2018 18:04   Dg Chest Port 1 View  Result Date: 03/02/2018 CLINICAL DATA:  Respiratory failure. EXAM: PORTABLE CHEST 1 VIEW COMPARISON:  Chest x-rays dated 02/25/2018 and 02/19/2018 FINDINGS: Endotracheal tube is well positioned with tip just above the level of the carina. RIGHT IJ catheter is stable in position. Enteric tube passes below the diaphragm. Stable cardiomegaly. Mild central pulmonary vascular congestion, likely chronic. No evidence of overt alveolar pulmonary edema. No confluent opacity to suggest a developing pneumonia. No pleural effusion or pneumothorax seen. IMPRESSION: 1. Endotracheal tube well positioned with tip just above the level of the carina. 2. Mild CHF, likely chronic. No evidence of pneumonia or overt alveolar pulmonary edema. Electronically Signed   By: Stan  Maynard M.D.   On: 03/02/2018 06:25   Patient Profile     85 y.o. female  With CHF, Atrial  Fib, CKD; echocardiogram shows ejection fraction 20 to 25%, moderate mitral regurgitation, mild left atrial enlargement, severe right atrial enlargement, moderate tricuspid regurgitation, small to moderate pericardial effusion.  Assessment & Plan    1.   Perm Atrial fib:   Heart rate mildly elevated.  Increase metoprolol to 25 mg twice daily.  Continue heparin.  2.   Acute on chronic systolic CHF:   LV function is reduced compared to previous.  Continue beta-blocker.  Can add hydralazine nitrates later as blood pressure allows.  Patient otherwise is not a candidate for aggressive cardiac measures at present.    3.   Acute on chronic stage IV-V kidney   disease-patient being  dialyzed by nephrology.  4.   Possible sepsis-antibiotics per primary service.  Pressors have been weaned to off.  5.   elevated troponin-can consider further evaluation if she improves.        For questions or updates, please contact [facility] Please consult [email or web address] for contact info under        Signed, Brian Crenshaw, MD  03/02/2018, 9:51 AM    

## 2018-03-02 NOTE — Progress Notes (Signed)
Pharmacy Antibiotic Note  [name] [name] is a 85 y.o. female admitted on 03/05/2018 with pneumonia.  Respiratory culture is growing Klebsiella and MRSA patient was on consolidated therapy with vanc/Ancef, but she has been spiking a fever most of the day today which prompted the change to ceftazidime.   Plan: -Change cefazolin to ceftazidime 1 g IV q24h -Continue vancomycin 1 g IV q48h -Obtain vancomycin random level tomorrow morning -Monitor antibiotic length of therapy   Height: 5\' 5"  (165.1 cm) Weight: 173 lb 15.1 oz (78.9 kg) IBW/kg (Calculated) : 57  Temp (24hrs), Avg:99.5 F (37.5 C), Min:97 F (36.1 C), Max:102.2 F (39 C)  Recent Labs  Lab 02/26/2018 1507  02/09/2018 1755 03/06/2018 2113 02/25/18 0034  02/26/18 0526  02/27/18 0431  02/28/18 0505 02/28/18 0506 02/28/18 1600 03/01/18 0311 03/01/18 1615 03/02/18 0408  WBC 27.5*  --   --  25.2*  --    < > 15.6*  --  12.6*  --  14.9*  --   --  13.8*  --  10.5  CREATININE 6.03*   < >  --  6.14*  --    < > 3.31*   < > 2.07*   < >  --  1.60* 1.42* 1.51* 1.81* 2.88*  LATICACIDVEN 7.8*  --  7.6* 5.3* 5.1*  --   --   --   --   --   --   --   --   --   --   --    < > = values in this interval not displayed.    Allergies  Allergen Reactions  . Codeine Nausea And Vomiting  . Sulfa Antibiotics Nausea And Vomiting  . Sulfur Nausea And Vomiting    Antimicrobials this admission: 1/21 zosyn > 1/23  1/22 Vanc >  1/23 ancef > 1/25 1/26 ceftazidime >>  Microbiology results: 1/20 BCx: no growth 1/21 TA: MRSA and klebsiella  1/2 MRSA PCR: positive     Harvel Quale 03/02/2018 11:07 AM

## 2018-03-02 NOTE — Progress Notes (Signed)
Patient ID: [name] [name], female   DOB: 06-Dec-1930, 85 y.o.   MRN: [id number]  [facility] Progress Note   Assessment/ Plan:   1. Acute kidney Injury on chronic kidney disease stage IV-V: Remains anuric and without any renal recovery-likely progressed to ESRD.  Mild hyperkalemia which will be treated with [facility] today and I will order for hemodialysis tomorrow (blood pressure permissive to conventional dialysis).  Volume status acceptable. 2.  Hyponatremia: Secondary to free water excretion defect with acute kidney injury-stable on CRRT/ultrafiltration. 3.  Acute hypoxic respiratory failure: Remains on ventilator support, continue UF with CRRT. 4.  Septic versus cardiogenic shock: Source identification pending, weaned off of pressors 5.  Fever/tachycardia/tachypnea: No clear focus of infection, on empiric coverage for HCAP with vancomycin and Ancef-would recommend switching the latter to [facility] for better gram-negative coverage.  Subjective:   Febrile overnight and this a.m.Marland Kitchen  CT scan of the head done yesterday evening negative for acute process.   Objective:   BP (!) 152/60   Pulse (!) 147   Temp (!) 101.7 F (38.7 C) (Bladder)   Resp (!) 38   Ht 5' 5"  (1.651 m)   Wt 78.9 kg   SpO2 100%   BMI 28.95 kg/m   Intake/Output Summary (Last 24 hours) at 03/02/2018 0759 Last data filed at 03/02/2018 0600 Gross per 24 hour  Intake 2473.02 ml  Output 1932 ml  Net 541.02 ml   Weight change: -5.4 kg  Physical Exam: Gen: Intubated, somnolent, attempts to turn head to calling out her voice CVS: Regular rhythm, normal rate, S1 and S2 normal Resp: Anteriorly clear to auscultation, no distinct rales or rhonchi Abd: Soft, obese, nontender Ext: Without lower extremity edema  Imaging: Ct Head Wo Contrast  Result Date: 03/01/2018 CLINICAL DATA:  Altered mental status. EXAM: CT HEAD WITHOUT CONTRAST TECHNIQUE: Contiguous axial images were obtained from the base of the skull  through the vertex without intravenous contrast. COMPARISON:  03/30/2016 FINDINGS: Brain: Chronic generalized atrophy. No sign of old or acute focal infarction, mass lesion, hemorrhage, hydrocephalus or extra-axial collection. Vascular: There is atherosclerotic calcification of the major vessels at the base of the brain. Skull: Negative Sinuses/Orbits: Clear/normal Other: None IMPRESSION: No acute or traumatic finding. Chronic generalized atrophy. Electronically Signed   By: Nelson Chimes M.D.   On: 03/01/2018 18:04   Dg Chest Port 1 View  Result Date: 03/02/2018 CLINICAL DATA:  Respiratory failure. EXAM: PORTABLE CHEST 1 VIEW COMPARISON:  Chest x-rays dated 02/25/2018 and 02/18/2018 FINDINGS: Endotracheal tube is well positioned with tip just above the level of the carina. RIGHT IJ catheter is stable in position. Enteric tube passes below the diaphragm. Stable cardiomegaly. Mild central pulmonary vascular congestion, likely chronic. No evidence of overt alveolar pulmonary edema. No confluent opacity to suggest a developing pneumonia. No pleural effusion or pneumothorax seen. IMPRESSION: 1. Endotracheal tube well positioned with tip just above the level of the carina. 2. Mild CHF, likely chronic. No evidence of pneumonia or overt alveolar pulmonary edema. Electronically Signed   By: Franki Cabot M.D.   On: 03/02/2018 06:25    Labs: BMET Recent Labs  Lab 02/27/18 0431 02/27/18 1500 02/28/18 0506 02/28/18 1600 03/01/18 0311 03/01/18 1615 03/02/18 0408  NA 134* 133* 134* 134* 133* 132* 132*  K 3.8 4.0 4.1 4.0 4.2 4.4 5.2*  CL 85* 90* 100 98 100 93* 95*  CO2 35* 29 24 25 23 24  19*  GLUCOSE 140* 195* 169* 196* 215* 281* 245*  BUN 39* 36* 32* 29* 30* 38* 68*  CREATININE 2.07* 1.98* 1.60* 1.42* 1.51* 1.81* 2.88*  CALCIUM 6.4* 7.2* 7.7* 7.6* 7.9* 8.0* 8.1*  PHOS 1.8* 3.4 1.1* 2.8 2.2* 4.1 4.1   CBC Recent Labs  Lab 02/27/2018 1507  02/27/18 0431 02/28/18 0505 03/01/18 0311 03/02/18 0408   WBC 27.5*   < > 12.6* 14.9* 13.8* 10.5  NEUTROABS 23.3*  --   --   --   --   --   HGB 12.0   < > 12.2 12.5 13.2 14.1  HCT 42.5   < > 40.1 40.5 42.4 43.8  MCV 103.2*   < > 93.7 92.7 92.2 89.4  PLT 159   < > 119* 104* 91* 90*   < > = values in this interval not displayed.    Medications:    . calcium carbonate (dosed in mg elemental calcium)  1,000 mg of elemental calcium Per Tube TID  . chlorhexidine gluconate (MEDLINE KIT)  15 mL Mouth Rinse BID  . [START ON 03/03/2018] Chlorhexidine Gluconate Cloth  6 each Topical Daily  . feeding supplement (PRO-STAT SUGAR FREE 64)  30 mL Per Tube BID  . feeding supplement (VITAL HIGH PROTEIN)  1,000 mL Per Tube Q24H  . insulin aspart  0-9 Units Subcutaneous Q4H  . levothyroxine  25 mcg Per Tube Q0600  . mouth rinse  15 mL Mouth Rinse 10 times per day  . pantoprazole sodium  40 mg Per Tube Q24H  . sodium chloride flush  10-40 mL Intracatheter Q12H   Elmarie Shiley, MD 03/02/2018, 7:59 AM

## 2018-03-02 NOTE — Progress Notes (Signed)
ANTICOAGULATION CONSULT NOTE  Pharmacy Consult for IV Heparin Indication: atrial fibrillation  Patient Measurements: Height: 5\' 5"  (165.1 cm) Weight: 173 lb 15.1 oz (78.9 kg) IBW/kg (Calculated) : 57 Heparin Dosing Weight: 79 kg  Vital Signs: Temp: 101.5 F (38.6 C) (01/26 0930) Temp Source: Bladder (01/26 0718) BP: 159/70 (01/26 0900) Pulse Rate: 87 (01/26 0930)  Labs: Recent Labs    02/28/18 0505 02/28/18 0506  03/01/18 0311 03/01/18 1615 03/02/18 0408  HGB 12.5  --   --  13.2  --  14.1  HCT 40.5  --   --  42.4  --  43.8  PLT 104*  --   --  91*  --  90*  APTT  --  97*  --  80*  --  82*  HEPARINUNFRC  --  1.09*  --  0.66  --  0.55  CREATININE  --  1.60*   < > 1.51* 1.81* 2.88*   < > = values in this interval not displayed.    Assessment: 85 year old female transitioned from oral Eliquis to IV Heparin. Night shift RN noted appearance of blood in stool 1/21 while cleaning up patient after supra-therapeutic aPTT > now resolved.  Heparin level and aPTT are therapeutic. Platelets are trending down. RN reports small amount of vaginal bleeding overnight.   Goal of Therapy:  Heparin level 0.3-0.7 units/ml aPTT 66-102 seconds Monitor platelets by anticoagulation protocol: Yes    Plan:  Continue heparin 800 units/hr  Daily HL, CBC   Harvel Quale 03/02/2018 11:00 AM

## 2018-03-02 NOTE — Progress Notes (Signed)
NAME:  [name] [name], MRN:  [id number], DOB:  1930/04/21, LOS: 6 ADMISSION DATE:  02/25/2018, CONSULTATION DATE:  02/10/2018 REFERRING MD:  Martin Majestic - [facility]  CHIEF COMPLAINT:  SOB   Brief History   This is an 85 year old female who presented to [facility] with SOB, cough, weakness and found to have multiple metabolic derangements including AoCKD with Scr 6, BUN 445, K 7.2. Received temporizing measures and transferred to [facility] for further management.   Past Medical History  HTN, HLD, CHF, A.fib, CKD, DM, diverticulosis, GERD.  Significant Hospital Events   1/20 >Admitted to ICU  Consults:  Nephrology Cardiology  Procedures:  ETT 1/20 >  R IJ HD cath 1/20 >   Significant Diagnostic Tests:  CXR 1/20 > CM with pulmonary edema. Echo 1/20 >  Impressions:  - Compared to a prior study in 2019, the LVEF is further reduced to   20-25% with more severe RV hypokinesis, inferior and lateral LV   hypokinesis to akinesis and elevated biventricular filling   pressure.  CT head 03/01/18  IMPRESSION: No acute or traumatic finding. Chronic generalized atrophy.  Micro Data:  Blood 1/20 > NGTD Sputum 1/20 > Staphylococcus aureus and klebsiella pneumonia Urine 1/20 > <10K colonies, insignificant  Antimicrobials:  Zosyn 02/24/17 Ancef   Interim history/subjective:  No acute events overnight. Intubated and sedated, not following commands. No family at bedside, will speak with family.   Objective   Blood pressure 111/63, pulse (!) 47, temperature 100.2 F (37.9 C), resp. rate (!) 31, height 5\' 5"  (1.651 m), weight 84.3 kg, SpO2 98 %.    Vent Mode: PRVC FiO2 (%):  [40 %] 40 % Set Rate:  [18 bmp] 18 bmp Vt Set:  [450 mL] 450 mL PEEP:  [5 cmH20] 5 cmH20 Pressure Support:  [10 cmH20] 10 cmH20 Plateau Pressure:  [19 cmH20-22 cmH20] 22 cmH20   Intake/Output Summary (Last 24 hours) at 03/02/2018 0636 Last data filed at 03/02/2018 0600 Gross per 24 hour  Intake 2538.02 ml  Output 2132 ml  Net 406.02 ml     Filed Weights   02/27/18 0200 02/28/18 0500 03/01/18 0500  Weight: 85.7 kg 82.9 kg 84.3 kg    Examination: General: Intubated, sedated, ill appearing, awake, not following commands HENT: Central line in place, atraumatic, normocephalic Lungs: Basilar crackles, normal work of breathing Cardiovascular: Tachycardia, irregular rhythm, no m/r/g Abdomen: Soft, non-tender, no distension Extremities: Trace BL LE edema, warm hands, warm knees Neuro: Sedated, awake, not following commands GU: No foley in place  Resolved Hospital Problem list    Assessment & Plan:  Hypoxic respiratory distress 2/2 pulmonary edema Cardiogenic shock  Septic shock Leukocytosis: Metabolic acidosis: -Likely a mix of cardiogenic and septic shock, overall she had minimal improvement in her mental status. She did receive some fentanyl this morning.  -Cardiology evaluated, not a good candidate for invasive cardiac procedures. Start metoprolol 12.5 BID. -On 1/24 she was more alert and following commands for a while however since then she has not been following any commands.  -CT head was negative for any acute abnormalities.  -Continue fentanyl for pain and agitation -Daily sedation vacation, daily SBT -Continue vancomycin, switch cefazolin to [place]  -Overall patient has had a poor response to treatment. She is not a good candidate for invasive cardiac procedures.   AoCKD: Hyperkalemia: -Nephrology following and managing CRRT. They suspect that her CKD has progressed to ESRD, etiology is thought to be ATN. Patient remains anuric.  -Transition to  intermittent hemodialysis  Transaminitis: Likely 2/2 shock liver. ALT and AST decreased down to 23 and 169 respectively. Continue to monitor.    Diabetes mellitus: -Receiving tube feeds -SSI -Frequent CBGS  Hypothyroidism: -Continue synthroid 25 mcg daily  HTN, HLD, HFrEF Atrial fibrillation with RVR -Currently on heparin -Holding apixiban, lasix, toprol,  and pravastatin  -Tried starting her on amiodarone drip for her atrial fibrillation however she became severely hypotensive and had to be placed in trandelenburg position and we had to increase her pressor support.  -Patient was started on digoxin, this was discontinued -Continue metoprolol 12.5 BID   Gout: -On allopurinol at home, currently holding this  Best practice:  Diet: Trickle feeds Pain/Anxiety/Delirium protocol (if indicated): Fentanyl and midazolam, RASS goal 0 to -1. VAP protocol (if indicated): Yes DVT prophylaxis: SCDs, heparin GI prophylaxis: PPI Glucose control: SSI Mobility: Bedrest Code Status: DNR Family Communication: Spoke with daughter  Disposition: Remain in ICU  Labs   CBC: Recent Labs  Lab 02/26/2018 1507  02/26/18 0526 02/27/18 0431 02/28/18 0505 03/01/18 0311 03/02/18 0408  WBC 27.5*   < > 15.6* 12.6* 14.9* 13.8* 10.5  NEUTROABS 23.3*  --   --   --   --   --   --   HGB 12.0   < > 12.6 12.2 12.5 13.2 14.1  HCT 42.5   < > 40.1 40.1 40.5 42.4 43.8  MCV 103.2*   < > 94.1 93.7 92.7 92.2 89.4  PLT 159   < > 137* 119* 104* 91* 90*   < > = values in this interval not displayed.    Basic Metabolic Panel: Recent Labs  Lab 02/26/18 0526  02/27/18 0431  02/28/18 0506 02/28/18 1600 03/01/18 0311 03/01/18 1615 03/02/18 0408  NA 133*   < > 134*   < > 134* 134* 133* 132* 132*  K 3.6   < > 3.8   < > 4.1 4.0 4.2 4.4 5.2*  CL 88*   < > 85*   < > 100 98 100 93* 95*  CO2 22   < > 35*   < > 24 25 23 24  19*  GLUCOSE 181*   < > 140*   < > 169* 196* 215* 281* 245*  BUN 52*   < > 39*   < > 32* 29* 30* 38* 68*  CREATININE 3.31*   < > 2.07*   < > 1.60* 1.42* 1.51* 1.81* 2.88*  CALCIUM 6.5*   < > 6.4*   < > 7.7* 7.6* 7.9* 8.0* 8.1*  MG 1.9  --  1.8  --  2.3  --  2.4  --  2.3  PHOS 4.5   < > 1.8*   < > 1.1* 2.8 2.2* 4.1 4.1   < > = values in this interval not displayed.   GFR: Estimated Creatinine Clearance: 15.3 mL/min (A) (by C-G formula based on SCr of 2.88  mg/dL (H)). Recent Labs  Lab 02/27/2018 1507 02/14/2018 1755 02/17/2018 2113 02/25/18 0034  02/27/18 0431 02/28/18 0505 03/01/18 0311 03/02/18 0408  WBC 27.5*  --  25.2*  --    < > 12.6* 14.9* 13.8* 10.5  LATICACIDVEN 7.8* 7.6* 5.3* 5.1*  --   --   --   --   --    < > = values in this interval not displayed.    Liver Function Tests: Recent Labs  Lab 02/15/2018 1507 02/13/2018 2113 02/25/18 0515  02/26/18 0526  02/28/18 0506 02/28/18 1600 03/01/18  4854 03/01/18 1615 03/02/18 0408  AST 644* 972* 1,162*  --  1,005*  --   --   --   --   --  169*  ALT 295* 450* 597*  --  670*  --   --   --   --   --  23  ALKPHOS 93 87 94  --  90  --   --   --   --   --  109  BILITOT 2.6* 2.5* 2.6*  --  3.1*  --   --   --   --   --  2.3*  PROT 7.3 6.6 6.9  --  6.4*  --   --   --   --   --  7.0  ALBUMIN 3.8 3.4* 3.5   < > 2.9*  2.9*   < > 2.6* 2.6* 2.7* 2.8* 2.7*  2.7*   < > = values in this interval not displayed.   Recent Labs  Lab 02/17/2018 1507  LIPASE 26   No results for input(s): AMMONIA in the last 168 hours.  ABG    Component Value Date/Time   PHART 7.396 02/25/2018 0644   PCO2ART 32.5 02/25/2018 0644   PO2ART 127.0 (H) 02/25/2018 0644   HCO3 20.1 02/25/2018 0644   TCO2 21 (L) 02/25/2018 0644   ACIDBASEDEF 4.0 (H) 02/25/2018 0644   O2SAT 99.0 02/25/2018 0644     Coagulation Profile: No results for input(s): INR, PROTIME in the last 168 hours.  Cardiac Enzymes: Recent Labs  Lab 02/06/2018 1507 02/15/2018 2113 02/25/18 0300 02/25/18 0820  TROPONINI 0.32* 0.52* 0.70* 1.09*    HbA1C: Hgb A1c MFr Bld  Date/Time Value Ref Range Status  10/23/2017 03:06 PM 7.9 (H) 4.8 - 5.6 % Final    Comment:    (NOTE) Pre diabetes:          5.7%-6.4% Diabetes:              >6.4% Glycemic control for   <7.0% adults with diabetes   09/17/2010 05:27 AM 6.3 (H) <5.7 % Final    Comment:    (NOTE)                                                                       According to the ADA  Clinical Practice Recommendations for 2011, when HbA1c is used as a screening test:  >=6.5%   Diagnostic of Diabetes Mellitus           (if abnormal result is confirmed) 5.7-6.4%   Increased risk of developing Diabetes Mellitus References:Diagnosis and Classification of Diabetes Mellitus,Diabetes OEVO,3500,93(GHWEX 1):S62-S69 and Standards of Medical Care in         Diabetes - 2011,Diabetes Care,2011,34 (Suppl 1):S11-S61.    CBG: Recent Labs  Lab 03/01/18 1109 03/01/18 1505 03/01/18 1929 03/01/18 2338 03/02/18 0346  GLUCAP 198* 215* 167* 242* 255*    Review of Systems:   Unable to obtain due to patients mental status  Past Medical History  She,  has a past medical history of Acid reflux, Atrial fibrillation ([place]), Cardiomyopathy ([place]), Chronic anticoagulation, Chronic combined systolic and diastolic CHF (congestive heart failure) ([place]), CKD (chronic kidney disease), stage IV ([place]), Diabetes mellitus type 2 in obese ([place]),  Diverticular hemorrhage (2011), Diverticulosis (2011), GI bleeding (08/03/2011), Gout, medication noncompliance, Hypercholesterolemia, Hyperglycemia, Hypertension, Internal hemorrhoids (2011), Lower extremity edema, Seasonal allergies, and Thrombocytopenia due to drugs.   Surgical History    Past Surgical History:  Procedure Laterality Date  . ADENOIDECTOMY    . APPENDECTOMY    . BUNIONECTOMY    . CATARACT EXTRACTION    . CESAREAN SECTION    . ESOPHAGOGASTRODUODENOSCOPY  08/20/09   small hiatal hernia/mild gastritis  . ileocolonoscopy  08/20/09   normal terminal ileum/pancolonic diverticula/no polyps/benign colon mucosa  . JOINT REPLACEMENT     Total right knee 2007  . LAPAROTOMY  09/22/2010   Procedure: EXPLORATORY LAPAROTOMY;  Surgeon: [name];  Location: [facility];  Service: General;  Laterality: N/A;  Lysis of Adhesions  . TONSILLECTOMY    . TUBAL LIGATION       Social History   reports that she has never smoked. She has never used smokeless  tobacco. She reports that she does not drink alcohol or use drugs.   Family History   Her family history includes Diabetes in her mother; Heart disease in her mother.   Allergies Allergies  Allergen Reactions  . Codeine Nausea And Vomiting  . Sulfa Antibiotics Nausea And Vomiting  . Sulfur Nausea And Vomiting     Home Medications  Prior to Admission medications   Medication Sig Start Date End Date Taking? Authorizing Provider  acetaminophen (TYLENOL) 325 MG tablet Take 2 tablets (650 mg total) by mouth every 4 (four) hours as needed for headache or mild pain. 10/27/17  Yes Emokpae, Courage, MD  allopurinol (ZYLOPRIM) 100 MG tablet Take 100 mg by mouth daily.   Yes [provider]  amitriptyline (ELAVIL) 25 MG tablet Take 25 mg by mouth at bedtime as needed for sleep.  02/14/18  Yes [provider]  apixaban (ELIQUIS) 2.5 MG TABS tablet Take 1 tablet (2.5 mg total) by mouth 2 (two) times daily. 10/27/17  Yes Emokpae, Courage, MD  Artificial Tear Ointment (DRY EYES OP) Place 1 drop into both eyes as needed (dry eyes).   Yes [provider]  ferrous sulfate 325 (65 FE) MG tablet Take 325 mg by mouth 2 (two) times daily with a meal.    Yes [provider]  fluticasone (FLONASE) 50 MCG/ACT nasal spray Place 1 spray into both nostrils daily.   Yes [provider]  furosemide (LASIX) 40 MG tablet Take 1 tablet (40 mg total) by mouth daily. Patient taking differently: Take 20 mg by mouth daily.  10/27/17  Yes Emokpae, Courage, MD  glimepiride (AMARYL) 2 MG tablet Take 1 tablet (2 mg total) by mouth daily with breakfast. 10/28/17  Yes Emokpae, Courage, MD  levalbuterol (XOPENEX) 1.25 MG/3ML nebulizer solution Take 1.25 mg by nebulization every 4 (four) hours as needed for wheezing. 09/10/17  Yes Valentina Shaggy, MD  loratadine (CLARITIN) 10 MG tablet Take 10 mg by mouth daily.   Yes [provider]  metoprolol succinate (TOPROL-XL) 25 MG 24 hr  tablet Take 25 mg by mouth every morning.   Yes [provider]  montelukast (SINGULAIR) 10 MG tablet Take 10 mg by mouth at bedtime.    Yes [provider]  Multiple Vitamin (MULTIVITAMIN WITH MINERALS) TABS tablet Take 1 tablet by mouth daily.   Yes [provider]  omeprazole (PRILOSEC) 20 MG capsule Take 1 capsule (20 mg total) by mouth daily. Patient taking differently: Take 20 mg by mouth every morning.  06/19/16  Yes Maczis, Barth Kirks, PA-C  potassium chloride SA (K-DUR,KLOR-CON) 20 MEQ tablet Take 1 tablet (20 mEq total) by mouth daily. 04/01/16  Yes Isaac Bliss, Rayford Halsted, MD  pravastatin (PRAVACHOL) 40 MG tablet Take 40 mg by mouth every evening.    Yes [provider]  sodium chloride (OCEAN) 0.65 % SOLN nasal spray Place 1 spray into both nostrils as needed for congestion.   Yes [provider]  Vitamin D, Ergocalciferol, (DRISDOL) 50000 units CAPS capsule Take 50,000 Units by mouth every 30 (thirty) days. Administered on the 1st of each month   Yes [provider]         Asencion Noble, M.D. PGY1 Pager [phone] 03/02/2018 6:36 AM

## 2018-03-03 ENCOUNTER — Inpatient Hospital Stay (HOSPITAL_COMMUNITY): Payer: Medicare Other

## 2018-03-03 LAB — GLUCOSE, CAPILLARY
Glucose-Capillary: 144 mg/dL — ABNORMAL HIGH (ref 70–99)
Glucose-Capillary: 170 mg/dL — ABNORMAL HIGH (ref 70–99)
Glucose-Capillary: 188 mg/dL — ABNORMAL HIGH (ref 70–99)
Glucose-Capillary: 190 mg/dL — ABNORMAL HIGH (ref 70–99)
Glucose-Capillary: 191 mg/dL — ABNORMAL HIGH (ref 70–99)
Glucose-Capillary: 210 mg/dL — ABNORMAL HIGH (ref 70–99)
Glucose-Capillary: 213 mg/dL — ABNORMAL HIGH (ref 70–99)

## 2018-03-03 LAB — POCT I-STAT 7, (LYTES, BLD GAS, ICA,H+H)
Acid-base deficit: 1 mmol/L (ref 0.0–2.0)
Bicarbonate: 20.2 mmol/L (ref 20.0–28.0)
Calcium, Ion: 1.01 mmol/L — ABNORMAL LOW (ref 1.15–1.40)
HCT: 41 % (ref 36.0–46.0)
Hemoglobin: 13.9 g/dL (ref 12.0–15.0)
O2 Saturation: 100 %
Patient temperature: 100.7
Potassium: 4.2 mmol/L (ref 3.5–5.1)
Sodium: 130 mmol/L — ABNORMAL LOW (ref 135–145)
TCO2: 21 mmol/L — ABNORMAL LOW (ref 22–32)
pCO2 arterial: 26.6 mmHg — ABNORMAL LOW (ref 32.0–48.0)
pH, Arterial: 7.492 — ABNORMAL HIGH (ref 7.350–7.450)
pO2, Arterial: 166 mmHg — ABNORMAL HIGH (ref 83.0–108.0)

## 2018-03-03 LAB — RENAL FUNCTION PANEL
Albumin: 2.4 g/dL — ABNORMAL LOW (ref 3.5–5.0)
Albumin: 2.3 g/dL — ABNORMAL LOW (ref 3.5–5.0)
Anion gap: 19 — ABNORMAL HIGH (ref 5–15)
Anion gap: 17 — ABNORMAL HIGH (ref 5–15)
BUN: 127 mg/dL — ABNORMAL HIGH (ref 8–23)
BUN: 68 mg/dL — ABNORMAL HIGH (ref 8–23)
CO2: 16 mmol/L — ABNORMAL LOW (ref 22–32)
CO2: 20 mmol/L — ABNORMAL LOW (ref 22–32)
Calcium: 7.8 mg/dL — ABNORMAL LOW (ref 8.9–10.3)
Calcium: 8 mg/dL — ABNORMAL LOW (ref 8.9–10.3)
Chloride: 96 mmol/L — ABNORMAL LOW (ref 98–111)
Chloride: 95 mmol/L — ABNORMAL LOW (ref 98–111)
Creatinine, Ser: 4.64 mg/dL — ABNORMAL HIGH (ref 0.44–1.00)
Creatinine, Ser: 3.42 mg/dL — ABNORMAL HIGH (ref 0.44–1.00)
GFR calc Af Amer: 9 mL/min — ABNORMAL LOW (ref >60)
GFR calc Af Amer: 13 mL/min — ABNORMAL LOW (ref >60)
GFR calc non Af Amer: 8 mL/min — ABNORMAL LOW (ref >60)
GFR calc non Af Amer: 12 mL/min — ABNORMAL LOW (ref >60)
Glucose, Bld: 208 mg/dL — ABNORMAL HIGH (ref 70–99)
Glucose, Bld: 211 mg/dL — ABNORMAL HIGH (ref 70–99)
Phosphorus: 4.9 mg/dL — ABNORMAL HIGH (ref 2.5–4.6)
Phosphorus: 3.8 mg/dL (ref 2.5–4.6)
Potassium: 6.2 mmol/L — ABNORMAL HIGH (ref 3.5–5.1)
Potassium: 4.4 mmol/L (ref 3.5–5.1)
Sodium: 131 mmol/L — ABNORMAL LOW (ref 135–145)
Sodium: 132 mmol/L — ABNORMAL LOW (ref 135–145)

## 2018-03-03 LAB — CBC
HCT: 39.8 % (ref 36.0–46.0)
Hemoglobin: 13.5 g/dL (ref 12.0–15.0)
MCH: 29.5 pg (ref 26.0–34.0)
MCHC: 33.9 g/dL (ref 30.0–36.0)
MCV: 86.9 fL (ref 80.0–100.0)
Platelets: 80 10*3/uL — ABNORMAL LOW (ref 150–400)
RBC: 4.58 MIL/uL (ref 3.87–5.11)
RDW: 18.9 % — ABNORMAL HIGH (ref 11.5–15.5)
WBC: 11 10*3/uL — ABNORMAL HIGH (ref 4.0–10.5)
nRBC: 1.2 % — ABNORMAL HIGH (ref 0.0–0.2)

## 2018-03-03 LAB — MAGNESIUM: Magnesium: 2.3 mg/dL (ref 1.7–2.4)

## 2018-03-03 LAB — VANCOMYCIN, RANDOM: Vancomycin Rm: 33

## 2018-03-03 LAB — HEPARIN LEVEL (UNFRACTIONATED): Heparin Unfractionated: 0.44 IU/mL (ref 0.30–0.70)

## 2018-03-03 MED ORDER — HYDROMORPHONE HCL 2 MG PO TABS: 2.0000 mg | ORAL_TABLET | Freq: Three times a day (TID) | ORAL | Status: DC

## 2018-03-03 MED ORDER — DEXMEDETOMIDINE HCL IN NACL 400 MCG/100ML IV SOLN
0.4000 ug/kg/h | INTRAVENOUS | Status: DC
  Administered 2018-03-03: 1.2 ug/kg/h via INTRAVENOUS
  Administered 2018-03-03: 0.9 ug/kg/h via INTRAVENOUS
  Administered 2018-03-03: 0.4 ug/kg/h via INTRAVENOUS
  Administered 2018-03-04: 0.5 ug/kg/h via INTRAVENOUS
  Filled 2018-03-03 (×4): qty 100

## 2018-03-03 MED ORDER — VITAL HIGH PROTEIN PO LIQD
1000.0000 mL | ORAL | Status: DC
  Administered 2018-03-03 – 2018-03-07 (×4): 1000 mL

## 2018-03-03 MED ORDER — PRO-STAT SUGAR FREE PO LIQD
30.0000 mL | Freq: Every day | ORAL | Status: DC
  Administered 2018-03-04 – 2018-03-09 (×6): 30 mL
  Filled 2018-03-03 (×5): qty 30

## 2018-03-03 MED ORDER — HYDROMORPHONE HCL 2 MG PO TABS
2.0000 mg | ORAL_TABLET | Freq: Three times a day (TID) | ORAL | Status: DC
  Administered 2018-03-03 – 2018-03-04 (×2): 2 mg
  Filled 2018-03-03 (×3): qty 1

## 2018-03-03 MED ORDER — INSULIN ASPART 100 UNIT/ML ~~LOC~~ SOLN
0.0000 [IU] | SUBCUTANEOUS | Status: DC
  Administered 2018-03-03: 3 [IU] via SUBCUTANEOUS
  Administered 2018-03-03: 5 [IU] via SUBCUTANEOUS
  Administered 2018-03-03: 2 [IU] via SUBCUTANEOUS
  Administered 2018-03-03 – 2018-03-04 (×5): 3 [IU] via SUBCUTANEOUS
  Administered 2018-03-04: 2 [IU] via SUBCUTANEOUS
  Administered 2018-03-05: 5 [IU] via SUBCUTANEOUS
  Administered 2018-03-05: 2 [IU] via SUBCUTANEOUS
  Administered 2018-03-05: 5 [IU] via SUBCUTANEOUS
  Administered 2018-03-05: 2 [IU] via SUBCUTANEOUS
  Administered 2018-03-05 (×2): 5 [IU] via SUBCUTANEOUS
  Administered 2018-03-05 – 2018-03-06 (×2): 8 [IU] via SUBCUTANEOUS
  Administered 2018-03-06 (×3): 5 [IU] via SUBCUTANEOUS
  Administered 2018-03-06: 8 [IU] via SUBCUTANEOUS
  Administered 2018-03-06 – 2018-03-07 (×2): 5 [IU] via SUBCUTANEOUS
  Administered 2018-03-07 (×2): 3 [IU] via SUBCUTANEOUS
  Administered 2018-03-08: 5 [IU] via SUBCUTANEOUS
  Administered 2018-03-08: 3 [IU] via SUBCUTANEOUS
  Administered 2018-03-08 (×2): 2 [IU] via SUBCUTANEOUS
  Administered 2018-03-09: 5 [IU] via SUBCUTANEOUS
  Administered 2018-03-09: 8 [IU] via SUBCUTANEOUS
  Administered 2018-03-09: 5 [IU] via SUBCUTANEOUS

## 2018-03-03 MED ORDER — CHLORHEXIDINE GLUCONATE 0.12 % MT SOLN
OROMUCOSAL | Status: AC
  Filled 2018-03-03: qty 15

## 2018-03-03 MED ORDER — VITAL HIGH PROTEIN PO LIQD: 1000.0000 mL | ORAL | Status: DC

## 2018-03-03 MED ORDER — ACETAMINOPHEN 160 MG/5ML PO SOLN
650.0000 mg | Freq: Four times a day (QID) | ORAL | Status: DC | PRN
  Administered 2018-03-03 – 2018-03-09 (×3): 650 mg
  Filled 2018-03-03 (×3): qty 20.3

## 2018-03-03 NOTE — Progress Notes (Signed)
Patient ID: [name] [name], female   DOB: 1930-05-07, 85 y.o.   MRN: [id number]  [facility] Progress Note   Assessment/ Plan:   1. Acute kidney Injury on chronic kidney disease stage IV-V: Likely with progression to end-stage renal disease and today will get intermittent hemodialysis via right IJ temporary catheter.  I am surprised by the abrupt rise of her BUN/potassium overnight-unlikely that this is entirely from her tube feeds and would recommend switching this to Nepro; raises concern for tissue necrosis (less likely for upper GI bleed) particularly with concomitant worsening acidosis.  She will get hemodialysis earlier today. 2.  Hyponatremia: Secondary to free water excretion defect with acute kidney injury-monitor with hemodialysis. 3.  Acute hypoxic respiratory failure: Remains on ventilator support, continue efforts at ultrafiltration with HD. 4.  Septic versus cardiogenic shock: Source identification pending, weaned off of pressors 5.  Fever/tachycardia/tachypnea: On broad-spectrum antimicrobial coverage with vancomycin and Fortaz for HCAP.  Subjective:   Afebrile with intermittent bradycardia noted overnight.   Objective:   BP (!) 128/52   Pulse (!) 39   Temp 98.7 F (37.1 C) (Axillary)   Resp (!) 34   Ht 5' 5"  (1.651 m)   Wt 78.9 kg   SpO2 97%   BMI 28.95 kg/m   Intake/Output Summary (Last 24 hours) at 03/03/2018 0735 Last data filed at 03/03/2018 0300 Gross per 24 hour  Intake 1306.61 ml  Output -  Net 1306.61 ml   Weight change:   Physical Exam: Gen: Intubated, somnolent, attempts to turn head to calling out her voice CVS: Regular rhythm, normal rate, S1 and S2 normal Resp: Anteriorly clear to auscultation, no distinct rales or rhonchi Abd: Soft, obese, nontender Ext: Without lower extremity edema  Imaging: Ct Head Wo Contrast  Result Date: 03/01/2018 CLINICAL DATA:  Altered mental status. EXAM: CT HEAD WITHOUT CONTRAST TECHNIQUE: Contiguous  axial images were obtained from the base of the skull through the vertex without intravenous contrast. COMPARISON:  03/30/2016 FINDINGS: Brain: Chronic generalized atrophy. No sign of old or acute focal infarction, mass lesion, hemorrhage, hydrocephalus or extra-axial collection. Vascular: There is atherosclerotic calcification of the major vessels at the base of the brain. Skull: Negative Sinuses/Orbits: Clear/normal Other: None IMPRESSION: No acute or traumatic finding. Chronic generalized atrophy. Electronically Signed   By: Nelson Chimes M.D.   On: 03/01/2018 18:04   Dg Chest Port 1 View  Result Date: 03/03/2018 CLINICAL DATA:  Respiratory failure. EXAM: PORTABLE CHEST 1 VIEW COMPARISON:  03/02/2018 FINDINGS: Endotracheal tube terminates 2.5 cm above the carina. Right jugular catheter terminates over the upper SVC, unchanged. Enteric tube courses into the left upper abdomen with tip not imaged. The cardiac silhouette remains enlarged. Mild pulmonary vascular congestion is unchanged. No airspace consolidation, pleural effusion, or pneumothorax is identified. IMPRESSION: Unchanged cardiomegaly and mild pulmonary vascular congestion. Electronically Signed   By: Logan Bores M.D.   On: 03/03/2018 06:40   Dg Chest Port 1 View  Result Date: 03/02/2018 CLINICAL DATA:  Respiratory failure. EXAM: PORTABLE CHEST 1 VIEW COMPARISON:  Chest x-rays dated 02/25/2018 and 03/07/2018 FINDINGS: Endotracheal tube is well positioned with tip just above the level of the carina. RIGHT IJ catheter is stable in position. Enteric tube passes below the diaphragm. Stable cardiomegaly. Mild central pulmonary vascular congestion, likely chronic. No evidence of overt alveolar pulmonary edema. No confluent opacity to suggest a developing pneumonia. No pleural effusion or pneumothorax seen. IMPRESSION: 1. Endotracheal tube well positioned with tip just above the  level of the carina. 2. Mild CHF, likely chronic. No evidence of pneumonia  or overt alveolar pulmonary edema. Electronically Signed   By: Franki Cabot M.D.   On: 03/02/2018 06:25    Labs: BMET Recent Labs  Lab 02/27/18 1500 02/28/18 0506 02/28/18 1600 03/01/18 0311 03/01/18 1615 03/02/18 0408 03/03/18 0300  NA 133* 134* 134* 133* 132* 132* 131*  K 4.0 4.1 4.0 4.2 4.4 5.2* 6.2*  CL 90* 100 98 100 93* 95* 96*  CO2 29 24 25 23 24  19* 16*  GLUCOSE 195* 169* 196* 215* 281* 245* 208*  BUN 36* 32* 29* 30* 38* 68* 127*  CREATININE 1.98* 1.60* 1.42* 1.51* 1.81* 2.88* 4.64*  CALCIUM 7.2* 7.7* 7.6* 7.9* 8.0* 8.1* 7.8*  PHOS 3.4 1.1* 2.8 2.2* 4.1 4.1 4.9*   CBC Recent Labs  Lab 03/02/2018 1507  02/28/18 0505 03/01/18 0311 03/02/18 0408 03/03/18 0300  WBC 27.5*   < > 14.9* 13.8* 10.5 11.0*  NEUTROABS 23.3*  --   --   --   --   --   HGB 12.0   < > 12.5 13.2 14.1 13.5  HCT 42.5   < > 40.5 42.4 43.8 39.8  MCV 103.2*   < > 92.7 92.2 89.4 86.9  PLT 159   < > 104* 91* 90* 80*   < > = values in this interval not displayed.    Medications:    . calcium carbonate (dosed in mg elemental calcium)  1,000 mg of elemental calcium Per Tube TID  . chlorhexidine gluconate (MEDLINE KIT)  15 mL Mouth Rinse BID  . Chlorhexidine Gluconate Cloth  6 each Topical Daily  . feeding supplement (PRO-STAT SUGAR FREE 64)  30 mL Per Tube BID  . feeding supplement (VITAL HIGH PROTEIN)  1,000 mL Per Tube Q24H  . insulin aspart  0-9 Units Subcutaneous Q4H  . levothyroxine  25 mcg Per Tube Q0600  . mouth rinse  15 mL Mouth Rinse 10 times per day  . metoprolol tartrate  25 mg Oral BID  . pantoprazole sodium  40 mg Per Tube Q24H  . sodium chloride flush  10-40 mL Intracatheter Q12H   Elmarie Shiley, MD 03/03/2018, 7:35 AM

## 2018-03-03 NOTE — Progress Notes (Signed)
NAME:  [name] [name], MRN:  [id number], DOB:  Jul 13, 1930, LOS: 7 ADMISSION DATE:  02/11/2018, CONSULTATION DATE:  02/18/2018 REFERRING MD:  Martin Majestic - [facility]  CHIEF COMPLAINT:  SOB   Brief History   This is an 85 year old female who presented to [facility] with SOB, cough, weakness and found to have multiple metabolic derangements including AoCKD with Scr 6, BUN 445, K 7.2. Received temporizing measures and transferred to [facility] for further management.   Past Medical History  HTN, HLD, CHF, A.fib, CKD, DM, diverticulosis, GERD.  Significant Hospital Events   1/20 >Admitted to ICU  Consults:  Nephrology Cardiology  Procedures:  ETT 1/20 >  R IJ HD cath 1/20 >   Significant Diagnostic Tests:  CXR 1/20 > CM with pulmonary edema. Echo 1/20 >  Impressions:  - Compared to a prior study in 2019, the LVEF is further reduced to   20-25% with more severe RV hypokinesis, inferior and lateral LV   hypokinesis to akinesis and elevated biventricular filling   pressure.  CT head 03/01/18  IMPRESSION: No acute or traumatic finding. Chronic generalized atrophy.  Micro Data:  Blood 1/20 > NGTD Sputum 1/20 > Staphylococcus aureus and klebsiella pneumonia Urine 1/20 > <10K colonies, insignificant  Antimicrobials:  Zosyn 02/15/2018 > 03/03/18 Ancef >03/02/18 Fortaz > 03/03/18  Interim history/subjective:  Patient was noted to have hyperkalemia to 6.2 this morning, going for dialysis this morning. She was awake, alert and following commands. Currently intubated.   Objective   Blood pressure (!) 146/62, pulse 97, temperature 99 F (37.2 C), temperature source Oral, resp. rate (!) 27, height 5\' 5"  (1.651 m), weight 78.9 kg, SpO2 97 %.    Vent Mode: PRVC FiO2 (%):  [40 %] 40 % Set Rate:  [18 bmp] 18 bmp Vt Set:  [450 mL] 450 mL PEEP:  [5 cmH20] 5 cmH20 Plateau Pressure:  [18 cmH20-23 cmH20] 23 cmH20   Intake/Output Summary (Last 24 hours) at 03/03/2018 4098 Last data filed at 03/03/2018 0300 Gross per 24  hour  Intake 1379.61 ml  Output -  Net 1379.61 ml   Filed Weights   02/28/18 0500 03/01/18 0500 03/02/18 0500  Weight: 82.9 kg 84.3 kg 78.9 kg    Examination: General: Intubated, sedated, ill appearing, awake, intermittently following commands HENT: Central line in place, atraumatic, normocephalic Lungs: Minimal basilar crackles, normal work of breathing Cardiovascular: Regular rate, irregular rhythm, no m/r/g Abdomen: Soft, non-tender, no distension Extremities: Trace BL LE edema, warm hands, warm knees Neuro: Sedated, awake, intermittently following commands GU: No foley in place  Resolved Hospital Problem list    Assessment & Plan:  Hypoxic respiratory distress 2/2 pulmonary edema Cardiogenic shock  Septic shock Metabolic acidosis: -Mixed cardiogenic and septic shock.  She has been weaned off pressors and has been having some improvement in her mental status today.  She has been having some waxing and waning of her awareness.  She did have some improvement on her mental status on Friday however and attempted SBT yesterday and very tachypneic and required Versed which significantly decreased her blood pressure.  We obtained a CT head over the weekend that was negative.  Since she is more awake this morning we can attempt a SBT today.  We will need to discuss with family possibility of extubation and what the plan would be if she failed the extubation.  -In terms of the sepsis she has been having fevers to 102.2, no leukocytosis.  She had positive respiratory  cultures of MRSA and Klebsiella and is currently on vancomycin and Fortaz for coverage. -Cardiology evaluated, not a good candidate for invasive cardiac procedures. -Discontinue Versed -Start Precedex drip -Daily sedation vacation, daily SBT -Continue vancomycin and Fortaz per pharmacy, 7 day course of antibiotics -Repeat sputum culture  AoCKD: Hyperkalemia: -Nephrology following and managing CRRT.  She remains anuric and  has progressed to end-stage renal disease.  Today she was noted to have hyperkalemia up to 6.2.  We will have dialysis earlier this morning. -Intermittent hemodialysis per nephrology  Transaminitis: Likely 2/2 shock liver. ALT and AST decreased down to 23 and 169 respectively. Continue to monitor.    Diabetes mellitus: -Receiving tube feeds -Increase SSI sensitive to resistant -Frequent CBGS  Hypothyroidism: -Continue synthroid 25 mcg daily  HTN, HLD, HFrEF Atrial fibrillation with RVR -Currently on heparin -Holding apixiban, lasix, toprol, and pravastatin  -Tried starting her on amiodarone drip for her atrial fibrillation however she became severely hypotensive and had to be placed in trandelenburg position and we had to increase her pressor support.  -Patient was started on digoxin, this was discontinued -Continue metoprolol 20 BID   Gout: -On allopurinol at home, currently holding this  Best practice:  Diet: Trickle feeds Pain/Anxiety/Delirium protocol (if indicated): Precedex, RASS goal 0 to -1. VAP protocol (if indicated): Yes DVT prophylaxis: SCDs, heparin GI prophylaxis: PPI Glucose control: SSI Mobility: Bedrest Code Status: DNR Family Communication: Spoke with daughter  Disposition: Remain in ICU  Labs   CBC: Recent Labs  Lab 02/25/2018 1507  02/27/18 0431 02/28/18 0505 03/01/18 0311 03/02/18 0408 03/03/18 0300  WBC 27.5*   < > 12.6* 14.9* 13.8* 10.5 11.0*  NEUTROABS 23.3*  --   --   --   --   --   --   HGB 12.0   < > 12.2 12.5 13.2 14.1 13.5  HCT 42.5   < > 40.1 40.5 42.4 43.8 39.8  MCV 103.2*   < > 93.7 92.7 92.2 89.4 86.9  PLT 159   < > 119* 104* 91* 90* 80*   < > = values in this interval not displayed.    Basic Metabolic Panel: Recent Labs  Lab 02/27/18 0431  02/28/18 0506 02/28/18 1600 03/01/18 0311 03/01/18 1615 03/02/18 0408 03/03/18 0300  NA 134*   < > 134* 134* 133* 132* 132* 131*  K 3.8   < > 4.1 4.0 4.2 4.4 5.2* 6.2*  CL 85*   < >  100 98 100 93* 95* 96*  CO2 35*   < > 24 25 23 24  19* 16*  GLUCOSE 140*   < > 169* 196* 215* 281* 245* 208*  BUN 39*   < > 32* 29* 30* 38* 68* 127*  CREATININE 2.07*   < > 1.60* 1.42* 1.51* 1.81* 2.88* 4.64*  CALCIUM 6.4*   < > 7.7* 7.6* 7.9* 8.0* 8.1* 7.8*  MG 1.8  --  2.3  --  2.4  --  2.3 2.3  PHOS 1.8*   < > 1.1* 2.8 2.2* 4.1 4.1 4.9*   < > = values in this interval not displayed.   GFR: Estimated Creatinine Clearance: 9.2 mL/min (A) (by C-G formula based on SCr of 4.64 mg/dL (H)). Recent Labs  Lab 03/07/2018 1507 02/22/2018 1755 02/05/2018 2113 02/25/18 0034  02/28/18 0505 03/01/18 0311 03/02/18 0408 03/03/18 0300  WBC 27.5*  --  25.2*  --    < > 14.9* 13.8* 10.5 11.0*  LATICACIDVEN 7.8* 7.6* 5.3* 5.1*  --   --   --   --   --    < > =  values in this interval not displayed.    Liver Function Tests: Recent Labs  Lab 03/03/2018 1507 02/08/2018 2113 02/25/18 0515  02/26/18 0526  02/28/18 1600 03/01/18 0311 03/01/18 1615 03/02/18 0408 03/03/18 0300  AST 644* 972* 1,162*  --  1,005*  --   --   --   --  169*  --   ALT 295* 450* 597*  --  670*  --   --   --   --  23  --   ALKPHOS 93 87 94  --  90  --   --   --   --  109  --   BILITOT 2.6* 2.5* 2.6*  --  3.1*  --   --   --   --  2.3*  --   PROT 7.3 6.6 6.9  --  6.4*  --   --   --   --  7.0  --   ALBUMIN 3.8 3.4* 3.5   < > 2.9*  2.9*   < > 2.6* 2.7* 2.8* 2.7*  2.7* 2.4*   < > = values in this interval not displayed.   Recent Labs  Lab 02/12/2018 1507  LIPASE 26   No results for input(s): AMMONIA in the last 168 hours.  ABG    Component Value Date/Time   PHART 7.396 02/25/2018 0644   PCO2ART 32.5 02/25/2018 0644   PO2ART 127.0 (H) 02/25/2018 0644   HCO3 20.1 02/25/2018 0644   TCO2 21 (L) 02/25/2018 0644   ACIDBASEDEF 4.0 (H) 02/25/2018 0644   O2SAT 99.0 02/25/2018 0644     Coagulation Profile: No results for input(s): INR, PROTIME in the last 168 hours.  Cardiac Enzymes: Recent Labs  Lab 02/17/2018 1507  02/20/2018 2113 02/25/18 0300 02/25/18 0820  TROPONINI 0.32* 0.52* 0.70* 1.09*    HbA1C: Hgb A1c MFr Bld  Date/Time Value Ref Range Status  10/23/2017 03:06 PM 7.9 (H) 4.8 - 5.6 % Final    Comment:    (NOTE) Pre diabetes:          5.7%-6.4% Diabetes:              >6.4% Glycemic control for   <7.0% adults with diabetes   09/17/2010 05:27 AM 6.3 (H) <5.7 % Final    Comment:    (NOTE)                                                                       According to the ADA Clinical Practice Recommendations for 2011, when HbA1c is used as a screening test:  >=6.5%   Diagnostic of Diabetes Mellitus           (if abnormal result is confirmed) 5.7-6.4%   Increased risk of developing Diabetes Mellitus References:Diagnosis and Classification of Diabetes Mellitus,Diabetes ZJQB,3419,37(TKWIO 1):S62-S69 and Standards of Medical Care in         Diabetes - 2011,Diabetes Care,2011,34 (Suppl 1):S11-S61.    CBG: Recent Labs  Lab 03/02/18 1107 03/02/18 1509 03/02/18 1944 03/02/18 2328 03/03/18 0251  GLUCAP 166* 224* 229* 170* 213*    Review of Systems:   Unable to obtain due to patients mental status  Past Medical History  She,  has a past medical history of Acid reflux,  Atrial fibrillation ([place]), Cardiomyopathy ([place]), Chronic anticoagulation, Chronic combined systolic and diastolic CHF (congestive heart failure) ([place]), CKD (chronic kidney disease), stage IV ([place]), Diabetes mellitus type 2 in obese [facility]), Diverticular hemorrhage (2011), Diverticulosis (2011), GI bleeding (08/03/2011), Gout, medication noncompliance, Hypercholesterolemia, Hyperglycemia, Hypertension, Internal hemorrhoids (2011), Lower extremity edema, Seasonal allergies, and Thrombocytopenia due to drugs.   Surgical History    Past Surgical History:  Procedure Laterality Date  . ADENOIDECTOMY    . APPENDECTOMY    . BUNIONECTOMY    . CATARACT EXTRACTION    . CESAREAN SECTION    . ESOPHAGOGASTRODUODENOSCOPY   08/20/09   small hiatal hernia/mild gastritis  . ileocolonoscopy  08/20/09   normal terminal ileum/pancolonic diverticula/no polyps/benign colon mucosa  . JOINT REPLACEMENT     Total right knee 2007  . LAPAROTOMY  09/22/2010   Procedure: EXPLORATORY LAPAROTOMY;  Surgeon: [name];  Location: [facility];  Service: General;  Laterality: N/A;  Lysis of Adhesions  . TONSILLECTOMY    . TUBAL LIGATION       Social History   reports that she has never smoked. She has never used smokeless tobacco. She reports that she does not drink alcohol or use drugs.   Family History   Her family history includes Diabetes in her mother; Heart disease in her mother.   Allergies Allergies  Allergen Reactions  . Codeine Nausea And Vomiting  . Sulfa Antibiotics Nausea And Vomiting  . Sulfur Nausea And Vomiting     Home Medications  Prior to Admission medications   Medication Sig Start Date End Date Taking? Authorizing Provider  acetaminophen (TYLENOL) 325 MG tablet Take 2 tablets (650 mg total) by mouth every 4 (four) hours as needed for headache or mild pain. 10/27/17  Yes Emokpae, Courage, MD  allopurinol (ZYLOPRIM) 100 MG tablet Take 100 mg by mouth daily.   Yes [provider]  amitriptyline (ELAVIL) 25 MG tablet Take 25 mg by mouth at bedtime as needed for sleep.  02/14/18  Yes [provider]  apixaban (ELIQUIS) 2.5 MG TABS tablet Take 1 tablet (2.5 mg total) by mouth 2 (two) times daily. 10/27/17  Yes Emokpae, Courage, MD  Artificial Tear Ointment (DRY EYES OP) Place 1 drop into both eyes as needed (dry eyes).   Yes [provider]  ferrous sulfate 325 (65 FE) MG tablet Take 325 mg by mouth 2 (two) times daily with a meal.    Yes [provider]  fluticasone (FLONASE) 50 MCG/ACT nasal spray Place 1 spray into both nostrils daily.   Yes [provider]  furosemide (LASIX) 40 MG tablet Take 1 tablet (40 mg total) by mouth daily. Patient taking differently:  Take 20 mg by mouth daily.  10/27/17  Yes Emokpae, Courage, MD  glimepiride (AMARYL) 2 MG tablet Take 1 tablet (2 mg total) by mouth daily with breakfast. 10/28/17  Yes Emokpae, Courage, MD  levalbuterol (XOPENEX) 1.25 MG/3ML nebulizer solution Take 1.25 mg by nebulization every 4 (four) hours as needed for wheezing. 09/10/17  Yes Valentina Shaggy, MD  loratadine (CLARITIN) 10 MG tablet Take 10 mg by mouth daily.   Yes [provider]  metoprolol succinate (TOPROL-XL) 25 MG 24 hr tablet Take 25 mg by mouth every morning.   Yes [provider]  montelukast (SINGULAIR) 10 MG tablet Take 10 mg by mouth at bedtime.    Yes [provider]  Multiple Vitamin (MULTIVITAMIN WITH MINERALS) TABS tablet Take 1 tablet by mouth daily.  Yes [provider]  omeprazole (PRILOSEC) 20 MG capsule Take 1 capsule (20 mg total) by mouth daily. Patient taking differently: Take 20 mg by mouth every morning.  06/19/16  Yes Maczis, Barth Kirks, PA-C  potassium chloride SA (K-DUR,KLOR-CON) 20 MEQ tablet Take 1 tablet (20 mEq total) by mouth daily. 04/01/16  Yes Isaac Bliss, Rayford Halsted, MD  pravastatin (PRAVACHOL) 40 MG tablet Take 40 mg by mouth every evening.    Yes [provider]  sodium chloride (OCEAN) 0.65 % SOLN nasal spray Place 1 spray into both nostrils as needed for congestion.   Yes [provider]  Vitamin D, Ergocalciferol, (DRISDOL) 50000 units CAPS capsule Take 50,000 Units by mouth every 30 (thirty) days. Administered on the 1st of each month   Yes [provider]         Asencion Noble, M.D. PGY1 Pager [phone] 03/03/2018 6:39 AM

## 2018-03-03 NOTE — Progress Notes (Signed)
[place] for IV Heparin Indication: atrial fibrillation  Patient Measurements: Height: 5\' 5"  (165.1 cm) Weight: 173 lb 15.1 oz (78.9 kg) IBW/kg (Calculated) : 57 Heparin Dosing Weight: 79 kg  Vital Signs: Temp: 98.7 F (37.1 C) (01/27 0718) Temp Source: Axillary (01/27 0718) BP: 128/52 (01/27 0700) Pulse Rate: 84 (01/27 0700)  Labs: Recent Labs    03/01/18 0311 03/01/18 1615 03/02/18 0408 03/03/18 0300  HGB 13.2  --  14.1 13.5  HCT 42.4  --  43.8 39.8  PLT 91*  --  90* 80*  APTT 80*  --  82*  --   HEPARINUNFRC 0.66  --  0.55 0.44  CREATININE 1.51* 1.81* 2.88* 4.64*    Assessment: 85 year old female transitioned from oral Eliquis to IV Heparin. Night shift RN noted appearance of blood in stool 1/21 while cleaning up patient after supra-therapeutic aPTT > now resolved.  Heparin level and aPTT are therapeutic and correlating. Platelets trending down. No bleeding overnight.   Goal of Therapy:  Heparin level 0.3-0.7 units/ml Monitor platelets by anticoagulation protocol: Yes    Plan:  Continue heparin 800 units/hr  Daily HL, CBC   Carlyon Shadow 03/03/2018 8:36 AM

## 2018-03-03 NOTE — Progress Notes (Signed)
Pharmacy Antibiotic Note  [name] [name] is a 85 y.o. female admitted on 02/23/2018 with pneumonia.  Respiratory culture is growing Klebsiella and MRSA patient was on consolidated therapy with vanc/Ancef, but she has been spiking a fever most of the day today which prompted the change to ceftazidime. Per CCM, total abx treatment 7 days.  Of note, patient switched from CRRT to [facility] 1/27.  Random vanc level 1/27: 33. WBC stable; afebrile   Plan: - Discontinue ceftazidime 1 g IV q24h tomorrow to complete course  - Discontinue vancomycin today  - monitor temp and CBCs   Height: 5\' 5"  (165.1 cm) Weight: 173 lb 15.1 oz (78.9 kg) IBW/kg (Calculated) : 57  Temp (24hrs), Avg:99.9 F (37.7 C), Min:98.7 F (37.1 C), Max:101.5 F (38.6 C)  Recent Labs  Lab 02/19/2018 1507  02/21/2018 1755 02/11/2018 2113 02/25/18 0034  02/27/18 0431  02/28/18 0505  02/28/18 1600 03/01/18 0311 03/01/18 1615 03/02/18 0408 03/03/18 0300  WBC 27.5*  --   --  25.2*  --    < > 12.6*  --  14.9*  --   --  13.8*  --  10.5 11.0*  CREATININE 6.03*   < >  --  6.14*  --    < > 2.07*   < >  --    < > 1.42* 1.51* 1.81* 2.88* 4.64*  LATICACIDVEN 7.8*  --  7.6* 5.3* 5.1*  --   --   --   --   --   --   --   --   --   --   VANCORANDOM  --   --   --   --   --   --   --   --   --   --   --   --   --   --  33   < > = values in this interval not displayed.    Allergies  Allergen Reactions  . Codeine Nausea And Vomiting  . Sulfa Antibiotics Nausea And Vomiting  . Sulfur Nausea And Vomiting    Antimicrobials this admission: 1/21 zosyn > 1/23  1/22 Vanc >  1/23 ancef > 1/25 1/26 ceftazidime >>  Microbiology results: 1/20 BCx: no growth 1/21 TA: MRSA and klebsiella  1/2 MRSA PCR: positive     Carlyon Shadow 03/03/2018 9:04 AM

## 2018-03-03 NOTE — Progress Notes (Signed)
Received call back from Dr Jonnie Finner, informed md pt potassim level 6.2 this am

## 2018-03-03 NOTE — Progress Notes (Signed)
Nutrition Follow-up  DOCUMENTATION CODES:   Not applicable  INTERVENTION:   Continue TF via OGT:   Vital High Protein at 60 ml/h   Pro-stat 30 ml once daily  Provides 1540 kcal, 141 gm protein, 1204 ml free water, 2304 mg potassium daily  NUTRITION DIAGNOSIS:   Inadequate oral intake related to inability to eat as evidenced by NPO status.  Ongoing  GOAL:   Patient will meet greater than or equal to 90% of their needs  Met with TF  MONITOR:   Vent status, TF tolerance, Labs, Skin, I & O's  ASSESSMENT:   85 yo female with PMH of A fib, HTN, HLD, CHF, diverticulosis, CKD stage IV, and DM who was admitted with severe hyperkalemia and acute renal failure with bilateral pulmonary edema. Required intubation on the evening of 1/20.  Patient transitioning from CRRT to intermittent HD today. Patient remains intubated on ventilator support MV: 14.8 L/min Temp (24hrs), Avg:98.9 F (37.2 C), Min:98 F (36.7 C), Max:99.3 F (37.4 C)   Labs reviewed. Sodium 131 (L), potassium 6.2 (H), phosphorus 4.9 (H) Medications reviewed and include calcium carbonate, novolog.   Receiving Vital High Protein at 55 ml/h with Pro-stat 30 ml BID, providing 1520 kcal, 146 gm protein, 1104 ml free water, 2112 mg potassium daily. Potassium rec for CKD IV and ESRD is < 2.4 gm per day. Current TF does not exceed potassium limit of 2.4 gm/d.    Diet Order:   Diet Order            Diet NPO time specified  Diet effective now              EDUCATION NEEDS:   No education needs have been identified at this time  Skin:  Skin Assessment: Reviewed RN Assessment(MASD to breast)  Last BM:  1/27 (type 7)  Height:   Ht Readings from Last 1 Encounters:  02/09/2018 5' 5"  (1.651 m)    Weight:   Wt Readings from Last 1 Encounters:  03/03/18 77.2 kg    Ideal Body Weight:  56.8 kg  BMI:  Body mass index is 28.32 kg/m.  Estimated Nutritional Needs:   Kcal:  1600  Protein:  130-150  gm  Fluid:  1.4 L    Molli Barrows, RD, LDN, [place] Pager [phone] After Hours Pager [phone]

## 2018-03-03 NOTE — Progress Notes (Signed)
Left radial a-line displaced when pt repositioned by nursing. Hemostasis achieved after manual pressure held x 10 minutes. No change in vascular assessment noted. Transparent occluded dressing applied.

## 2018-03-03 NOTE — Progress Notes (Addendum)
Called elink, informed Dr Oletta Darter pt potassium level 6.2. This rn will page and report potassium level to nephrology md

## 2018-03-04 DIAGNOSIS — R0689 Other abnormalities of breathing: Secondary | ICD-10-CM

## 2018-03-04 LAB — HEPARIN LEVEL (UNFRACTIONATED): Heparin Unfractionated: 0.62 IU/mL (ref 0.30–0.70)

## 2018-03-04 LAB — RENAL FUNCTION PANEL
Albumin: 2.2 g/dL — ABNORMAL LOW (ref 3.5–5.0)
Albumin: 2.1 g/dL — ABNORMAL LOW (ref 3.5–5.0)
Anion gap: 19 — ABNORMAL HIGH (ref 5–15)
Anion gap: 23 — ABNORMAL HIGH (ref 5–15)
BUN: 98 mg/dL — ABNORMAL HIGH (ref 8–23)
BUN: 126 mg/dL — ABNORMAL HIGH (ref 8–23)
CO2: 20 mmol/L — ABNORMAL LOW (ref 22–32)
CO2: 17 mmol/L — ABNORMAL LOW (ref 22–32)
Calcium: 7.4 mg/dL — ABNORMAL LOW (ref 8.9–10.3)
Calcium: 7.3 mg/dL — ABNORMAL LOW (ref 8.9–10.3)
Chloride: 92 mmol/L — ABNORMAL LOW (ref 98–111)
Chloride: 93 mmol/L — ABNORMAL LOW (ref 98–111)
Creatinine, Ser: 4.19 mg/dL — ABNORMAL HIGH (ref 0.44–1.00)
Creatinine, Ser: 4.89 mg/dL — ABNORMAL HIGH (ref 0.44–1.00)
GFR calc Af Amer: 11 mL/min — ABNORMAL LOW (ref >60)
GFR calc Af Amer: 9 mL/min — ABNORMAL LOW (ref >60)
GFR calc non Af Amer: 9 mL/min — ABNORMAL LOW (ref >60)
GFR calc non Af Amer: 8 mL/min — ABNORMAL LOW (ref >60)
Glucose, Bld: 188 mg/dL — ABNORMAL HIGH (ref 70–99)
Glucose, Bld: 180 mg/dL — ABNORMAL HIGH (ref 70–99)
Phosphorus: 4.7 mg/dL — ABNORMAL HIGH (ref 2.5–4.6)
Phosphorus: 5.9 mg/dL — ABNORMAL HIGH (ref 2.5–4.6)
Potassium: 5 mmol/L (ref 3.5–5.1)
Potassium: 4.2 mmol/L (ref 3.5–5.1)
Sodium: 131 mmol/L — ABNORMAL LOW (ref 135–145)
Sodium: 133 mmol/L — ABNORMAL LOW (ref 135–145)

## 2018-03-04 LAB — CBC
HCT: 39 % (ref 36.0–46.0)
HCT: 38.1 % (ref 36.0–46.0)
Hemoglobin: 12.7 g/dL (ref 12.0–15.0)
Hemoglobin: 12.4 g/dL (ref 12.0–15.0)
MCH: 29.1 pg (ref 26.0–34.0)
MCH: 29.3 pg (ref 26.0–34.0)
MCHC: 32.6 g/dL (ref 30.0–36.0)
MCHC: 32.5 g/dL (ref 30.0–36.0)
MCV: 89.2 fL (ref 80.0–100.0)
MCV: 90.1 fL (ref 80.0–100.0)
Platelets: 83 10*3/uL — ABNORMAL LOW (ref 150–400)
Platelets: 87 10*3/uL — ABNORMAL LOW (ref 150–400)
RBC: 4.37 MIL/uL (ref 3.87–5.11)
RBC: 4.23 MIL/uL (ref 3.87–5.11)
RDW: 18.8 % — ABNORMAL HIGH (ref 11.5–15.5)
RDW: 18.9 % — ABNORMAL HIGH (ref 11.5–15.5)
WBC: 14.7 10*3/uL — ABNORMAL HIGH (ref 4.0–10.5)
WBC: 17.5 10*3/uL — ABNORMAL HIGH (ref 4.0–10.5)
nRBC: 0.9 % — ABNORMAL HIGH (ref 0.0–0.2)
nRBC: 0.7 % — ABNORMAL HIGH (ref 0.0–0.2)

## 2018-03-04 LAB — GLUCOSE, CAPILLARY
Glucose-Capillary: 180 mg/dL — ABNORMAL HIGH (ref 70–99)
Glucose-Capillary: 149 mg/dL — ABNORMAL HIGH (ref 70–99)
Glucose-Capillary: 167 mg/dL — ABNORMAL HIGH (ref 70–99)
Glucose-Capillary: 161 mg/dL — ABNORMAL HIGH (ref 70–99)
Glucose-Capillary: 200 mg/dL — ABNORMAL HIGH (ref 70–99)

## 2018-03-04 LAB — MAGNESIUM: Magnesium: 2 mg/dL (ref 1.7–2.4)

## 2018-03-04 LAB — LIPASE, BLOOD: Lipase: 181 U/L — ABNORMAL HIGH (ref 11–51)

## 2018-03-04 LAB — HEPATITIS B CORE ANTIBODY, TOTAL: Hep B Core Total Ab: NEGATIVE

## 2018-03-04 LAB — HEPATITIS B SURFACE ANTIGEN: Hepatitis B Surface Ag: NEGATIVE

## 2018-03-04 LAB — HEPATITIS B E ANTIBODY: Hep B E Ab: NEGATIVE

## 2018-03-04 MED ORDER — HYDROMORPHONE HCL 2 MG PO TABS
1.0000 mg | ORAL_TABLET | Freq: Three times a day (TID) | ORAL | Status: DC
  Administered 2018-03-04 – 2018-03-07 (×8): 1 mg
  Filled 2018-03-04 (×9): qty 1

## 2018-03-04 MED ORDER — LINEZOLID 600 MG/300ML IV SOLN
600.0000 mg | Freq: Two times a day (BID) | INTRAVENOUS | Status: DC
  Administered 2018-03-04 – 2018-03-05 (×4): 600 mg via INTRAVENOUS
  Filled 2018-03-04 (×5): qty 300

## 2018-03-04 MED ORDER — DILTIAZEM HCL-SODIUM CHLORIDE 100-0.9 MG/100ML-% IV SOLN: 10.0000 mg/h | Freq: Once | INTRAVENOUS | Status: DC

## 2018-03-04 MED ORDER — SODIUM CHLORIDE 0.9 % IV SOLN
1.0000 g | INTRAVENOUS | Status: DC
  Administered 2018-03-04 – 2018-03-06 (×3): 1 g via INTRAVENOUS
  Filled 2018-03-04 (×4): qty 1

## 2018-03-04 MED ORDER — PHENYLEPHRINE HCL-NACL 10-0.9 MG/250ML-% IV SOLN
0.0000 ug/min | INTRAVENOUS | Status: DC
  Administered 2018-03-04: 30 ug/min via INTRAVENOUS
  Administered 2018-03-04: 20 ug/min via INTRAVENOUS
  Administered 2018-03-04: 15 ug/min via INTRAVENOUS
  Administered 2018-03-05 (×2): 20 ug/min via INTRAVENOUS
  Administered 2018-03-06: 5 ug/min via INTRAVENOUS
  Filled 2018-03-04 (×8): qty 250

## 2018-03-04 NOTE — Progress Notes (Signed)
Late entry: Patient bleeding from her vagina. MD made aware. MD at bedside with this RN using a speculum to examine. Per MD, heparin was stopped at 1411. No change in vital signs or hemoglobin. A family meeting will take place tomorrow and will continue to monitor patient closely.

## 2018-03-04 NOTE — Progress Notes (Signed)
[name] NEPHROLOGY PROGRESS NOTE  Assessment/ Plan: Pt is a 85 y.o. yo female.with respiratory failure requiring mechanical ventilation, cardiogenic shock and renal failure.  #Acute kidney injury on CKD stage IV-V: due to shock, likely progression to ESRD now.  Required CRRT in the beginning and then intermittent HD.  Status post hemodialysis yesterday with UF of 1.7 L.  Patient is febrile and hypotensive currently on pressors.  Plan for intermittent hemo-tomorrow if blood pressure level is acceptable.  If hemodynamically unstable she will need CRRT.  She has temporary catheter. -Monitor potassium level.  Recommend to change tube feeding to Nepro.  #Febrile episode: She has temporary catheter for dialysis, catheter site looks clean.  Blood culture ordered by primary team.  #Acute respiratory failure with hypoxia: Currently on ventilator support, plan for UF with HD if tolerated by BP.  # Septic versus cardiogenic shock: On pressors.  BP better with phenylephrine.  #Hyponatremia: Hypervolemic.  Subjective: Seen and examined in ICU.  Currently intubated, sedated.  Unable to obtain review of system.  Patient was febrile and hypotensive overnight. Objective Vital signs in last 24 hours: Vitals:   03/04/18 0600 03/04/18 0700 03/04/18 0737 03/04/18 0804  BP: 96/66 108/65  115/63  Pulse: 76 (!) 57  79  Resp: (!) 29 (!) 30  (!) 32  Temp:   (!) 102.7 F (39.3 C)   TempSrc:   Oral   SpO2: 98% 98%  98%  Weight:      Height:       Weight change: -1.5 kg  Intake/Output Summary (Last 24 hours) at 03/04/2018 0846 Last data filed at 03/04/2018 0700 Gross per 24 hour  Intake 2328.25 ml  Output 1905 ml  Net 423.25 ml       Labs: Basic Metabolic Panel: Recent Labs  Lab 03/03/18 0300 03/03/18 1747 03/03/18 1855 03/04/18 0440  NA 131* 130* 132* 131*  K 6.2* 4.2 4.4 5.0  CL 96*  --  95* 92*  CO2 16*  --  20* 20*  GLUCOSE 208*  --  211* 188*  BUN 127*  --  68* 98*   CREATININE 4.64*  --  3.42* 4.19*  CALCIUM 7.8*  --  8.0* 7.4*  PHOS 4.9*  --  3.8 4.7*   Liver Function Tests: Recent Labs  Lab 02/26/18 0526  03/02/18 0408 03/03/18 0300 03/03/18 1855 03/04/18 0440  AST 1,005*  --  169*  --   --   --   ALT 670*  --  23  --   --   --   ALKPHOS 90  --  109  --   --   --   BILITOT 3.1*  --  2.3*  --   --   --   PROT 6.4*  --  7.0  --   --   --   ALBUMIN 2.9*  2.9*   < > 2.7*  2.7* 2.4* 2.3* 2.2*   < > = values in this interval not displayed.   No results for input(s): LIPASE, AMYLASE in the last 168 hours. No results for input(s): AMMONIA in the last 168 hours. CBC: Recent Labs  Lab 02/28/18 0505 03/01/18 0311 03/02/18 0408 03/03/18 0300 03/03/18 1747 03/04/18 0440  WBC 14.9* 13.8* 10.5 11.0*  --  14.7*  HGB 12.5 13.2 14.1 13.5 13.9 12.7  HCT 40.5 42.4 43.8 39.8 41.0 39.0  MCV 92.7 92.2 89.4 86.9  --  89.2  PLT 104* 91* 90* 80*  --  83*  Cardiac Enzymes: No results for input(s): CKTOTAL, CKMB, CKMBINDEX, TROPONINI in the last 168 hours. CBG: Recent Labs  Lab 03/03/18 1707 03/03/18 1911 03/03/18 2335 03/04/18 0409 03/04/18 0735  GLUCAP 188* 190* 210* 180* 149*    Iron Studies: No results for input(s): IRON, TIBC, TRANSFERRIN, FERRITIN in the last 72 hours. Studies/Results: Dg Chest Port 1 View  Result Date: 03/03/2018 CLINICAL DATA:  Respiratory failure. EXAM: PORTABLE CHEST 1 VIEW COMPARISON:  03/02/2018 FINDINGS: Endotracheal tube terminates 2.5 cm above the carina. Right jugular catheter terminates over the upper SVC, unchanged. Enteric tube courses into the left upper abdomen with tip not imaged. The cardiac silhouette remains enlarged. Mild pulmonary vascular congestion is unchanged. No airspace consolidation, pleural effusion, or pneumothorax is identified. IMPRESSION: Unchanged cardiomegaly and mild pulmonary vascular congestion. Electronically Signed   By: Logan Bores M.D.   On: 03/03/2018 06:40     Medications: Infusions: . sodium chloride Stopped (03/02/18 1709)  . cefTAZidime (FORTAZ)  IV Stopped (03/03/18 1231)  . dexmedetomidine (PRECEDEX) IV infusion 0.5 mcg/kg/hr (03/04/18 0700)  . feeding supplement (VITAL HIGH PROTEIN) 1,000 mL (03/03/18 1713)  . heparin 800 Units/hr (03/04/18 0700)  . phenylephrine (NEO-SYNEPHRINE) Adult infusion 30 mcg/min (03/04/18 0754)    Scheduled Medications: . calcium carbonate (dosed in mg elemental calcium)  1,000 mg of elemental calcium Per Tube TID  . chlorhexidine gluconate (MEDLINE KIT)  15 mL Mouth Rinse BID  . Chlorhexidine Gluconate Cloth  6 each Topical Daily  . feeding supplement (PRO-STAT SUGAR FREE 64)  30 mL Per Tube Daily  . HYDROmorphone  1 mg Per Tube Q8H  . insulin aspart  0-15 Units Subcutaneous Q4H  . levothyroxine  25 mcg Per Tube Q0600  . mouth rinse  15 mL Mouth Rinse 10 times per day  . metoprolol tartrate  25 mg Oral BID  . pantoprazole sodium  40 mg Per Tube Q24H  . sodium chloride flush  10-40 mL Intracatheter Q12H    have reviewed scheduled and prn medications.  Physical Exam: General: Intubated, lying in bed Heart:RRR, s1s2 nl, no rubs Lungs coarse breath sound bilateral, no wheezing Abdomen:soft, Non-tender, non-distended Extremities:No edema Dialysis Access: Neck temporary IJ catheter, no discharge or redness around the catheter site.   Prasad  03/04/2018,8:46 AM  LOS: 8 days

## 2018-03-04 NOTE — Progress Notes (Deleted)
CRITICAL VALUE ALERT  Critical Value:  Ca 6.1  Date & Time Notied:  03/04/18 0128  Provider Notified: E-Link RN  Orders Received/Actions taken: Will notify Md, no new orders

## 2018-03-04 NOTE — Progress Notes (Signed)
eLink Physician-Brief Progress Note Patient Name: [name] [name] DOB: Sep 03, 1930 MRN: [id number]   Date of Service  03/04/2018  HPI/Events of Note  Hypotension - BP = 86/51 with MAP = 62. LVEF = 20-25%  eICU Interventions  Will order: 1. Phenylephrine IV infusion. Titrate to MAP >= 65.     Intervention Category Major Interventions: Hypotension - evaluation and management  Shaquina Gillham [place] 03/04/2018, 1:53 AM

## 2018-03-04 NOTE — Progress Notes (Signed)
Pharmacy Antibiotic Note  [name] [name] is a 85 y.o. female admitted on 02/05/2018 with pneumonia.  Respiratory culture grew Klebsiella and MRSA; patient was on consolidated therapy with vanc/Ancef and then changed to vanc/ceftazidime for total duration of 7 days.  Due to persistent fevers (highest 102.1), increasing WBC to 14.7, CCM recommends restarting pneumonia coverage with linezolid + cefepime.   Of note, patient on iHD.   Plan: - initiate linezolid 600 mg IV q12h  - initiate cefepime 1 g IV q24h   - monitor temp and CBCs  - F/U c&s  Height: 5\' 5"  (165.1 cm) Weight: 173 lb 1 oz (78.5 kg) IBW/kg (Calculated) : 57  Temp (24hrs), Avg:100.8 F (38.2 C), Min:98.6 F (37 C), Max:102.7 F (39.3 C)  Recent Labs  Lab 02/28/18 0505  03/01/18 0311 03/01/18 1615 03/02/18 0408 03/03/18 0300 03/03/18 1855 03/04/18 0440  WBC 14.9*  --  13.8*  --  10.5 11.0*  --  14.7*  CREATININE  --    < > 1.51* 1.81* 2.88* 4.64* 3.42* 4.19*  VANCORANDOM  --   --   --   --   --  33  --   --    < > = values in this interval not displayed.    Allergies  Allergen Reactions  . Codeine Nausea And Vomiting  . Sulfa Antibiotics Nausea And Vomiting  . Sulfur Nausea And Vomiting    Antimicrobials this admission: 1/21 zosyn > 1/23  1/22 Vanc >1/27  1/23 ancef > 1/25 1/26 ceftazidime>1/28 1/28 cefepime>> 1/28 linezolid>>   Microbiology results: 1/20 BCx: no growth 1/21 TA: MRSA and klebsiella  1/2 MRSA PCR: positive  1/28 TA:  1/28 BCx:    Sharyn Lull  Vondra Aldredge 03/04/2018 1:20 PM

## 2018-03-04 NOTE — Progress Notes (Addendum)
NAME:  [name] [name], MRN:  [id number], DOB:  1930-11-08, LOS: 8 ADMISSION DATE:  03/06/2018, CONSULTATION DATE:  02/23/2018 REFERRING MD:  Martin Majestic - [facility]  CHIEF COMPLAINT:  SOB   Brief History   This is an 85 year old female who presented to [facility] with SOB, cough, weakness and found to have multiple metabolic derangements including AoCKD with Scr 6, BUN 445, K 7.2. Received temporizing measures and transferred to [facility] for further management.   Past Medical History  HTN, HLD, CHF, A.fib, CKD, DM, diverticulosis, GERD.  Significant Hospital Events   1/20 >Admitted to ICU  Consults:  Nephrology Cardiology  Procedures:  ETT 1/20 >  R IJ HD cath 1/20 >   Significant Diagnostic Tests:  CXR 1/20 > CM with pulmonary edema. Echo 1/20 >  Impressions:  - Compared to a prior study in 2019, the LVEF is further reduced to   20-25% with more severe RV hypokinesis, inferior and lateral LV   hypokinesis to akinesis and elevated biventricular filling   pressure.  CT head 03/01/18  IMPRESSION: No acute or traumatic finding. Chronic generalized atrophy.  Micro Data:  Blood 1/20 > NGTD Sputum 1/20 > Staphylococcus aureus and klebsiella pneumonia Urine 1/20 > <10K colonies, insignificant  Antimicrobials:  Zosyn 02/19/2018 > 03/03/18 Ancef >03/02/18 Fortaz > 03/03/18  Interim history/subjective:  Patient was noted to have hypotension down to 88/51 this morning with a map of 62.  He was started on a phenylephrine drip.  Patient also had a fever up to 102.1.  Patient is currently intubated and sedated with Precedex.  Not following any commands today.  Objective   Blood pressure 96/66, pulse 76, temperature (!) 102.1 F (38.9 C), temperature source Oral, resp. rate (!) 29, height 5\' 5"  (1.651 m), weight 78.5 kg, SpO2 98 %.    Vent Mode: PRVC FiO2 (%):  [40 %] 40 % Set Rate:  [18 bmp] 18 bmp Vt Set:  [450 mL] 450 mL PEEP:  [5 cmH20] 5 cmH20 Plateau Pressure:  [17 cmH20-24 cmH20] 20 cmH20    Intake/Output Summary (Last 24 hours) at 03/04/2018 0656 Last data filed at 03/04/2018 0600 Gross per 24 hour  Intake 2572.55 ml  Output 1905 ml  Net 667.55 ml   Filed Weights   03/03/18 0932 03/03/18 1332 03/04/18 0445  Weight: 78.9 kg 77.2 kg 78.5 kg    Examination: General: Intubated, sedated, ill appearing, not following commands HENT: Central line in place, atraumatic, normocephalic Lungs: Bilateral rhonchi, tachypneic, on ventilator Cardiovascular: Regular rate, irregular rhythm, no m/r/g Abdomen: Soft, non-tender, no distension Extremities: Trace BL LE edema, warm hands, warm knees Neuro: Sedated, awake, not following commands GU: No foley in place  Resolved Hospital Problem list    Assessment & Plan:  Hypoxic respiratory distress 2/2 pulmonary edema Cardiogenic shock  Septic shock Metabolic acidosis: -Mixed cardiogenic and septic shock.  She has been having some waxing and waning of her alertness.  Patient became hypotensive last night and required restarting her phenylephrine drip.  Patient does have a significantly decreased EF of 20 to 25%.  She is not a candidate for any invasive procedures.  She has completed a 7-day course of antibiotics for a pneumonia however she is still having fevers and an elevated leukocytosis up to 14 today.   Dialysis access site does not look infected. It appears that she is still in septic shock, unknown source, we are repeating respiratory and blood cultures today. -Cardiology evaluated, not a good candidate  for invasive cardiac procedures. -Continue Precedex drip -Daily sedation vacation, daily SBT -Start cefepime and linezolid per pharmacy. Already completed 7 day course of antibiotics -Repeat sputum culture, has grown few gram + cocci and few gram - rods, cultures pending -Decrease dilaudid dose to 1mg  q8 hr -Continue neosyneprhine drip, titrate down as tolerated  Vaginal bleeding: -Nurse reported some vaginal bleeding today. On  exam she has minimal bleeding around the vaginal area and in the vaginal canal. No active oozing occurring. She is on heparin and had a foley in a few days ago. It's possible that this could be some trauma from that.  -Will obtain bladder scan, if there is urine we will do an in and out cath for U/A and urine culture -Hold heparin for now -Will perform speculum exam >> Dark blood in vaginal canal, unable to define source.  -Patient does not have brisk active bleeding, hgb has been stable, we will provide supportive care at this time. If patient becomes unstable or starts having active bleeding we may consult IR or surgery for possible intervention.  -CBC q8 hours, will transfuse for Hgb <7   AoCKD: Hyperkalemia: -Nephrology following.  She remains anuric and has progressed to end-stage renal disease.   -Patient underwent dialysis yesterday.  BMP shows no need for immediate dialysis. -Intermittent hemodialysis per nephrology  Transaminitis: Resolved, likely 2/2 shock liver  Diabetes mellitus: -Receiving tube feeds -SSI-resistant -Frequent CBGS  Hypothyroidism: -Continue synthroid 25 mcg daily  HTN, HLD, HFrEF Atrial fibrillation with RVR -Currently on heparin -Holding apixiban, lasix, toprol, and pravastatin  -Tried starting her on amiodarone drip for her atrial fibrillation however she became severely hypotensive and had to be placed in trandelenburg position and we had to increase her pressor support.  -Patient was started on digoxin, this was discontinued -Continue metoprolol 20 BID   Gout: -On allopurinol at home, currently holding this  GOC: -We have been discussing with the family her goals of care and plans if she does not improve. She is currently DNR but does not seem to be having much improvement. We will continue this discussion today.  -Family meeting planned for tomorrow  Best practice:  Diet: Tube feeds Pain/Anxiety/Delirium protocol (if indicated): Precedex,  RASS goal 0 to -1. VAP protocol (if indicated): Yes DVT prophylaxis: SCDs, heparin GI prophylaxis: PPI Glucose control: SSI Mobility: Bedrest Code Status: DNR Family Communication: Spoke with daughter  Disposition: Remain in ICU  Labs   CBC: Recent Labs  Lab 02/28/18 0505 03/01/18 0311 03/02/18 0408 03/03/18 0300 03/03/18 1747 03/04/18 0440  WBC 14.9* 13.8* 10.5 11.0*  --  14.7*  HGB 12.5 13.2 14.1 13.5 13.9 12.7  HCT 40.5 42.4 43.8 39.8 41.0 39.0  MCV 92.7 92.2 89.4 86.9  --  89.2  PLT 104* 91* 90* 80*  --  83*    Basic Metabolic Panel: Recent Labs  Lab 02/28/18 0506  03/01/18 0311 03/01/18 1615 03/02/18 0408 03/03/18 0300 03/03/18 1747 03/03/18 1855 03/04/18 0440  NA 134*   < > 133* 132* 132* 131* 130* 132* 131*  K 4.1   < > 4.2 4.4 5.2* 6.2* 4.2 4.4 5.0  CL 100   < > 100 93* 95* 96*  --  95* 92*  CO2 24   < > 23 24 19* 16*  --  20* 20*  GLUCOSE 169*   < > 215* 281* 245* 208*  --  211* 188*  BUN 32*   < > 30* 38* 68* 127*  --  68* 98*  CREATININE 1.60*   < > 1.51* 1.81* 2.88* 4.64*  --  3.42* 4.19*  CALCIUM 7.7*   < > 7.9* 8.0* 8.1* 7.8*  --  8.0* 7.4*  MG 2.3  --  2.4  --  2.3 2.3  --   --  2.0  PHOS 1.1*   < > 2.2* 4.1 4.1 4.9*  --  3.8 4.7*   < > = values in this interval not displayed.   GFR: Estimated Creatinine Clearance: 10.2 mL/min (A) (by C-G formula based on SCr of 4.19 mg/dL (H)). Recent Labs  Lab 03/01/18 0311 03/02/18 0408 03/03/18 0300 03/04/18 0440  WBC 13.8* 10.5 11.0* 14.7*    Liver Function Tests: Recent Labs  Lab 02/26/18 0526  03/01/18 1615 03/02/18 0408 03/03/18 0300 03/03/18 1855 03/04/18 0440  AST 1,005*  --   --  169*  --   --   --   ALT 670*  --   --  23  --   --   --   ALKPHOS 90  --   --  109  --   --   --   BILITOT 3.1*  --   --  2.3*  --   --   --   PROT 6.4*  --   --  7.0  --   --   --   ALBUMIN 2.9*  2.9*   < > 2.8* 2.7*  2.7* 2.4* 2.3* 2.2*   < > = values in this interval not displayed.   No results for  input(s): LIPASE, AMYLASE in the last 168 hours. No results for input(s): AMMONIA in the last 168 hours.  ABG    Component Value Date/Time   PHART 7.492 (H) 03/03/2018 1747   PCO2ART 26.6 (L) 03/03/2018 1747   PO2ART 166.0 (H) 03/03/2018 1747   HCO3 20.2 03/03/2018 1747   TCO2 21 (L) 03/03/2018 1747   ACIDBASEDEF 1.0 03/03/2018 1747   O2SAT 100.0 03/03/2018 1747     Coagulation Profile: No results for input(s): INR, PROTIME in the last 168 hours.  Cardiac Enzymes: Recent Labs  Lab 02/25/18 0820  TROPONINI 1.09*    HbA1C: Hgb A1c MFr Bld  Date/Time Value Ref Range Status  10/23/2017 03:06 PM 7.9 (H) 4.8 - 5.6 % Final    Comment:    (NOTE) Pre diabetes:          5.7%-6.4% Diabetes:              >6.4% Glycemic control for   <7.0% adults with diabetes   09/17/2010 05:27 AM 6.3 (H) <5.7 % Final    Comment:    (NOTE)                                                                       According to the ADA Clinical Practice Recommendations for 2011, when HbA1c is used as a screening test:  >=6.5%   Diagnostic of Diabetes Mellitus           (if abnormal result is confirmed) 5.7-6.4%   Increased risk of developing Diabetes Mellitus References:Diagnosis and Classification of Diabetes Mellitus,Diabetes HTDS,2876,81(LXBWI 1):S62-S69 and Standards of Medical Care in         Diabetes - 2011,Diabetes  AOZH,0865,78 (Suppl 1):S11-S61.    CBG: Recent Labs  Lab 03/03/18 1113 03/03/18 1707 03/03/18 1911 03/03/18 2335 03/04/18 0409  GLUCAP 144* 188* 190* 210* 180*    Review of Systems:   Unable to obtain due to patients mental status  Past Medical History  She,  has a past medical history of Acid reflux, Atrial fibrillation ([place]), Cardiomyopathy ([place]), Chronic anticoagulation, Chronic combined systolic and diastolic CHF (congestive heart failure) ([place]), CKD (chronic kidney disease), stage IV ([place]), Diabetes mellitus type 2 in obese [facility]), Diverticular hemorrhage (2011),  Diverticulosis (2011), GI bleeding (08/03/2011), Gout, medication noncompliance, Hypercholesterolemia, Hyperglycemia, Hypertension, Internal hemorrhoids (2011), Lower extremity edema, Seasonal allergies, and Thrombocytopenia due to drugs.   Surgical History    Past Surgical History:  Procedure Laterality Date  . ADENOIDECTOMY    . APPENDECTOMY    . BUNIONECTOMY    . CATARACT EXTRACTION    . CESAREAN SECTION    . ESOPHAGOGASTRODUODENOSCOPY  08/20/09   small hiatal hernia/mild gastritis  . ileocolonoscopy  08/20/09   normal terminal ileum/pancolonic diverticula/no polyps/benign colon mucosa  . JOINT REPLACEMENT     Total right knee 2007  . LAPAROTOMY  09/22/2010   Procedure: EXPLORATORY LAPAROTOMY;  Surgeon: [name];  Location: [facility];  Service: General;  Laterality: N/A;  Lysis of Adhesions  . TONSILLECTOMY    . TUBAL LIGATION       Social History   reports that she has never smoked. She has never used smokeless tobacco. She reports that she does not drink alcohol or use drugs.   Family History   Her family history includes Diabetes in her mother; Heart disease in her mother.   Allergies Allergies  Allergen Reactions  . Codeine Nausea And Vomiting  . Sulfa Antibiotics Nausea And Vomiting  . Sulfur Nausea And Vomiting     Home Medications  Prior to Admission medications   Medication Sig Start Date End Date Taking? Authorizing Provider  acetaminophen (TYLENOL) 325 MG tablet Take 2 tablets (650 mg total) by mouth every 4 (four) hours as needed for headache or mild pain. 10/27/17  Yes Emokpae, Courage, MD  allopurinol (ZYLOPRIM) 100 MG tablet Take 100 mg by mouth daily.   Yes [provider]  amitriptyline (ELAVIL) 25 MG tablet Take 25 mg by mouth at bedtime as needed for sleep.  02/14/18  Yes [provider]  apixaban (ELIQUIS) 2.5 MG TABS tablet Take 1 tablet (2.5 mg total) by mouth 2 (two) times daily. 10/27/17  Yes Emokpae, Courage, MD  Artificial Tear  Ointment (DRY EYES OP) Place 1 drop into both eyes as needed (dry eyes).   Yes [provider]  ferrous sulfate 325 (65 FE) MG tablet Take 325 mg by mouth 2 (two) times daily with a meal.    Yes [provider]  fluticasone (FLONASE) 50 MCG/ACT nasal spray Place 1 spray into both nostrils daily.   Yes [provider]  furosemide (LASIX) 40 MG tablet Take 1 tablet (40 mg total) by mouth daily. Patient taking differently: Take 20 mg by mouth daily.  10/27/17  Yes Emokpae, Courage, MD  glimepiride (AMARYL) 2 MG tablet Take 1 tablet (2 mg total) by mouth daily with breakfast. 10/28/17  Yes Emokpae, Courage, MD  levalbuterol (XOPENEX) 1.25 MG/3ML nebulizer solution Take 1.25 mg by nebulization every 4 (four) hours as needed for wheezing. 09/10/17  Yes Valentina Shaggy, MD  loratadine (CLARITIN) 10 MG tablet Take 10 mg by mouth daily.   Yes [provider]  metoprolol succinate (TOPROL-XL) 25 MG 24 hr tablet Take 25 mg by mouth every morning.   Yes [provider]  montelukast (SINGULAIR) 10 MG tablet Take 10 mg by mouth at bedtime.    Yes [provider]  Multiple Vitamin (MULTIVITAMIN WITH MINERALS) TABS tablet Take 1 tablet by mouth daily.   Yes [provider]  omeprazole (PRILOSEC) 20 MG capsule Take 1 capsule (20 mg total) by mouth daily. Patient taking differently: Take 20 mg by mouth every morning.  06/19/16  Yes Maczis, Barth Kirks, PA-C  potassium chloride SA (K-DUR,KLOR-CON) 20 MEQ tablet Take 1 tablet (20 mEq total) by mouth daily. 04/01/16  Yes Isaac Bliss, Rayford Halsted, MD  pravastatin (PRAVACHOL) 40 MG tablet Take 40 mg by mouth every evening.    Yes [provider]  sodium chloride (OCEAN) 0.65 % SOLN nasal spray Place 1 spray into both nostrils as needed for congestion.   Yes [provider]  Vitamin D, Ergocalciferol, (DRISDOL) 50000 units CAPS capsule Take 50,000 Units by mouth every 30 (thirty) days.  Administered on the 1st of each month   Yes [provider]         Asencion Noble, M.D. PGY1 Pager [phone] 03/04/2018 6:56 AM

## 2018-03-04 NOTE — Progress Notes (Addendum)
[facility] for IV Heparin Indication: atrial fibrillation  Patient Measurements: Height: 5\' 5"  (165.1 cm) Weight: 173 lb 1 oz (78.5 kg) IBW/kg (Calculated) : 57 Heparin Dosing Weight: 79 kg  Vital Signs: Temp: 102.7 F (39.3 C) (01/28 0737) Temp Source: Oral (01/28 0737) BP: 108/65 (01/28 0700) Pulse Rate: 57 (01/28 0700)  Labs: Recent Labs    03/02/18 0408 03/03/18 0300 03/03/18 1747 03/03/18 1855 03/04/18 0440  HGB 14.1 13.5 13.9  --  12.7  HCT 43.8 39.8 41.0  --  39.0  PLT 90* 80*  --   --  83*  APTT 82*  --   --   --   --   HEPARINUNFRC 0.55 0.44  --   --  0.62  CREATININE 2.88* 4.64*  --  3.42* 4.19*    Assessment: 85 year old female transitioned from oral Eliquis to IV Heparin. Night shift RN noted appearance of blood in stool 1/21 while cleaning up patient after supra-therapeutic aPTT > now resolved.  Heparin level and aPTT are therapeutic and correlating. Platelets trending down. Vaginal bleeding reported by RN. Heparin lvl therapeutic today. CCM recommends continuing heparin gtt.   Goal of Therapy:  Heparin level 0.3-0.7 units/ml Monitor platelets by anticoagulation protocol: Yes   Plan:  Continue heparin 800 units/hr  Daily HL, CBC, and monitor s/sx of bleedig   Carlyon Shadow 03/04/2018 7:47 AM

## 2018-03-05 ENCOUNTER — Inpatient Hospital Stay (HOSPITAL_COMMUNITY): Payer: Medicare Other

## 2018-03-05 LAB — RENAL FUNCTION PANEL
Albumin: 1.9 g/dL — ABNORMAL LOW (ref 3.5–5.0)
Albumin: 1.9 g/dL — ABNORMAL LOW (ref 3.5–5.0)
Albumin: 2.1 g/dL — ABNORMAL LOW (ref 3.5–5.0)
Anion gap: 22 — ABNORMAL HIGH (ref 5–15)
Anion gap: 22 — ABNORMAL HIGH (ref 5–15)
Anion gap: 17 — ABNORMAL HIGH (ref 5–15)
BUN: 145 mg/dL — ABNORMAL HIGH (ref 8–23)
BUN: 159 mg/dL — ABNORMAL HIGH (ref 8–23)
BUN: 48 mg/dL — ABNORMAL HIGH (ref 8–23)
CO2: 16 mmol/L — ABNORMAL LOW (ref 22–32)
CO2: 15 mmol/L — ABNORMAL LOW (ref 22–32)
CO2: 21 mmol/L — ABNORMAL LOW (ref 22–32)
Calcium: 7.2 mg/dL — ABNORMAL LOW (ref 8.9–10.3)
Calcium: 7.3 mg/dL — ABNORMAL LOW (ref 8.9–10.3)
Calcium: 7.4 mg/dL — ABNORMAL LOW (ref 8.9–10.3)
Chloride: 93 mmol/L — ABNORMAL LOW (ref 98–111)
Chloride: 93 mmol/L — ABNORMAL LOW (ref 98–111)
Chloride: 96 mmol/L — ABNORMAL LOW (ref 98–111)
Creatinine, Ser: 5.62 mg/dL — ABNORMAL HIGH (ref 0.44–1.00)
Creatinine, Ser: 5.94 mg/dL — ABNORMAL HIGH (ref 0.44–1.00)
Creatinine, Ser: 2.37 mg/dL — ABNORMAL HIGH (ref 0.44–1.00)
GFR calc Af Amer: 7 mL/min — ABNORMAL LOW (ref >60)
GFR calc Af Amer: 7 mL/min — ABNORMAL LOW (ref >60)
GFR calc Af Amer: 21 mL/min — ABNORMAL LOW (ref >60)
GFR calc non Af Amer: 6 mL/min — ABNORMAL LOW (ref >60)
GFR calc non Af Amer: 6 mL/min — ABNORMAL LOW (ref >60)
GFR calc non Af Amer: 18 mL/min — ABNORMAL LOW (ref >60)
Glucose, Bld: 258 mg/dL — ABNORMAL HIGH (ref 70–99)
Glucose, Bld: 316 mg/dL — ABNORMAL HIGH (ref 70–99)
Glucose, Bld: 128 mg/dL — ABNORMAL HIGH (ref 70–99)
Phosphorus: 7.8 mg/dL — ABNORMAL HIGH (ref 2.5–4.6)
Phosphorus: 8.3 mg/dL — ABNORMAL HIGH (ref 2.5–4.6)
Phosphorus: 2.7 mg/dL (ref 2.5–4.6)
Potassium: 4.3 mmol/L (ref 3.5–5.1)
Potassium: 4.3 mmol/L (ref 3.5–5.1)
Potassium: 3.3 mmol/L — ABNORMAL LOW (ref 3.5–5.1)
Sodium: 131 mmol/L — ABNORMAL LOW (ref 135–145)
Sodium: 130 mmol/L — ABNORMAL LOW (ref 135–145)
Sodium: 134 mmol/L — ABNORMAL LOW (ref 135–145)

## 2018-03-05 LAB — CBC
HCT: 36.2 % (ref 36.0–46.0)
HCT: 36.4 % (ref 36.0–46.0)
HCT: 37.4 % (ref 36.0–46.0)
Hemoglobin: 12 g/dL (ref 12.0–15.0)
Hemoglobin: 11.9 g/dL — ABNORMAL LOW (ref 12.0–15.0)
Hemoglobin: 12.6 g/dL (ref 12.0–15.0)
MCH: 29.6 pg (ref 26.0–34.0)
MCH: 29.2 pg (ref 26.0–34.0)
MCH: 29 pg (ref 26.0–34.0)
MCHC: 33.1 g/dL (ref 30.0–36.0)
MCHC: 32.7 g/dL (ref 30.0–36.0)
MCHC: 33.7 g/dL (ref 30.0–36.0)
MCV: 89.2 fL (ref 80.0–100.0)
MCV: 89.4 fL (ref 80.0–100.0)
MCV: 86 fL (ref 80.0–100.0)
Platelets: 85 10*3/uL — ABNORMAL LOW (ref 150–400)
Platelets: 87 10*3/uL — ABNORMAL LOW (ref 150–400)
Platelets: 92 10*3/uL — ABNORMAL LOW (ref 150–400)
RBC: 4.06 MIL/uL (ref 3.87–5.11)
RBC: 4.07 MIL/uL (ref 3.87–5.11)
RBC: 4.35 MIL/uL (ref 3.87–5.11)
RDW: 18.6 % — ABNORMAL HIGH (ref 11.5–15.5)
RDW: 18.7 % — ABNORMAL HIGH (ref 11.5–15.5)
RDW: 18.6 % — ABNORMAL HIGH (ref 11.5–15.5)
WBC: 19.8 10*3/uL — ABNORMAL HIGH (ref 4.0–10.5)
WBC: 20.6 10*3/uL — ABNORMAL HIGH (ref 4.0–10.5)
WBC: 22.5 10*3/uL — ABNORMAL HIGH (ref 4.0–10.5)
nRBC: 0.4 % — ABNORMAL HIGH (ref 0.0–0.2)
nRBC: 0.3 % — ABNORMAL HIGH (ref 0.0–0.2)
nRBC: 0.3 % — ABNORMAL HIGH (ref 0.0–0.2)

## 2018-03-05 LAB — C DIFFICILE QUICK SCREEN W PCR REFLEX
C Diff antigen: POSITIVE — AB
C Diff toxin: NEGATIVE

## 2018-03-05 LAB — GLUCOSE, CAPILLARY
Glucose-Capillary: 121 mg/dL — ABNORMAL HIGH (ref 70–99)
Glucose-Capillary: 149 mg/dL — ABNORMAL HIGH (ref 70–99)
Glucose-Capillary: 224 mg/dL — ABNORMAL HIGH (ref 70–99)
Glucose-Capillary: 224 mg/dL — ABNORMAL HIGH (ref 70–99)
Glucose-Capillary: 228 mg/dL — ABNORMAL HIGH (ref 70–99)
Glucose-Capillary: 247 mg/dL — ABNORMAL HIGH (ref 70–99)
Glucose-Capillary: 298 mg/dL — ABNORMAL HIGH (ref 70–99)

## 2018-03-05 LAB — MAGNESIUM: Magnesium: 1.8 mg/dL (ref 1.7–2.4)

## 2018-03-05 LAB — CLOSTRIDIUM DIFFICILE BY PCR, REFLEXED: Toxigenic C. Difficile by PCR: POSITIVE — AB

## 2018-03-05 MED ORDER — INSULIN GLARGINE 100 UNIT/ML ~~LOC~~ SOLN
10.0000 [IU] | Freq: Every day | SUBCUTANEOUS | Status: DC
  Administered 2018-03-05: 10 [IU] via SUBCUTANEOUS
  Filled 2018-03-05 (×2): qty 0.1

## 2018-03-05 MED ORDER — PENTAFLUOROPROP-TETRAFLUOROETH EX AERO: 1.0000 "application " | INHALATION_SPRAY | CUTANEOUS | Status: DC | PRN

## 2018-03-05 MED ORDER — ASPIRIN 81 MG PO CHEW
81.0000 mg | CHEWABLE_TABLET | Freq: Every day | ORAL | Status: DC
  Administered 2018-03-05 – 2018-03-09 (×5): 81 mg
  Filled 2018-03-05 (×4): qty 1

## 2018-03-05 MED ORDER — SODIUM CHLORIDE 0.9 % IV SOLN: 100.0000 mL | INTRAVENOUS | Status: DC | PRN

## 2018-03-05 MED ORDER — HEPARIN SODIUM (PORCINE) 1000 UNIT/ML DIALYSIS: 1000.0000 [IU] | INTRAMUSCULAR | Status: DC | PRN

## 2018-03-05 MED ORDER — CHLORHEXIDINE GLUCONATE CLOTH 2 % EX PADS: 6.0000 | MEDICATED_PAD | Freq: Every day | CUTANEOUS | Status: DC

## 2018-03-05 MED ORDER — CHLORHEXIDINE GLUCONATE 0.12 % MT SOLN
OROMUCOSAL | Status: AC
  Administered 2018-03-05: 15 mL
  Filled 2018-03-05: qty 15

## 2018-03-05 MED ORDER — LIDOCAINE HCL (PF) 1 % IJ SOLN: 5.0000 mL | INTRAMUSCULAR | Status: DC | PRN

## 2018-03-05 MED ORDER — LIDOCAINE-PRILOCAINE 2.5-2.5 % EX CREA: 1.0000 "application " | TOPICAL_CREAM | CUTANEOUS | Status: DC | PRN

## 2018-03-05 MED ORDER — ALTEPLASE 2 MG IJ SOLR
2.0000 mg | Freq: Once | INTRAMUSCULAR | Status: DC | PRN
  Filled 2018-03-05: qty 2

## 2018-03-05 NOTE — Plan of Care (Signed)
  Problem: Role Relationship: Goal: Method of communication will improve Outcome: Progressing   Problem: Clinical Measurements: Goal: Cardiovascular complication will be avoided Outcome: Progressing   Problem: Coping: Goal: Level of anxiety will decrease Outcome: Progressing   Problem: Elimination: Goal: Will not experience complications related to urinary retention Outcome: Progressing   Problem: Pain Managment: Goal: General experience of comfort will improve Outcome: Progressing   Problem: Safety: Goal: Ability to remain free from injury will improve Outcome: Progressing   Problem: Health Behavior/Discharge Planning: Goal: Ability to manage health-related needs will improve Outcome: Not Progressing   Problem: Activity: Goal: Risk for activity intolerance will decrease Outcome: Not Progressing

## 2018-03-05 NOTE — Progress Notes (Signed)
[facility] NEPHROLOGY PROGRESS NOTE  Assessment/ Plan: Pt is a 85 y.o. yo female.with respiratory failure requiring mechanical ventilation, cardiogenic shock and renal failure.  #Acute kidney injury on CKD stage IV-V: due to shock, likely progression to ESRD now.  Required CRRT in the beginning and then intermittent HD. Last HD on 1/27 with UF of 1.7 L. She remains anuric with no sign of renal recovery. Her BP is around 130s on low dose levophed. I will try intermittent HD today to see if she can tolerate. If she doesn't tolerate HD then she will need CRRT, d/w ICU team. She has temporary catheter. -Monitor potassium level.   #Febrile episode: She has temporary catheter for dialysis, catheter site looks clean.  Blood culture negative.  #Acute respiratory failure with hypoxia: Currently on ventilator support, plan for UF with HD if tolerated by BP.  # Septic versus cardiogenic shock: On pressors.  BP better with phenylephrine.  #Hyponatremia: Hypervolemic. Except to improve with HD  Subjective: Seen and examined in ICU. Remains intubated, sedated.  Unable to obtain review of system.  Patient was febrile to 102.   Objective Vital signs in last 24 hours: Vitals:   03/05/18 0640 03/05/18 0650 03/05/18 0700 03/05/18 0721  BP: (!) 129/98 108/64 (!) 131/54 (!) 169/58  Pulse:    71  Resp:    (!) 28  Temp:    99.3 F (37.4 C)  TempSrc:    Oral  SpO2:    100%  Weight:      Height:       Weight change: 2.1 kg  Intake/Output Summary (Last 24 hours) at 03/05/2018 0846 Last data filed at 03/05/2018 0600 Gross per 24 hour  Intake 2716.17 ml  Output 575 ml  Net 2141.17 ml       Labs: Basic Metabolic Panel: Recent Labs  Lab 03/04/18 0440 03/04/18 1600 03/05/18 0348  NA 131* 133* 131*  K 5.0 4.2 4.3  CL 92* 93* 93*  CO2 20* 17* 16*  GLUCOSE 188* 180* 258*  BUN 98* 126* 145*  CREATININE 4.19* 4.89* 5.62*  CALCIUM 7.4* 7.3* 7.2*  PHOS 4.7* 5.9* 7.8*   Liver  Function Tests: Recent Labs  Lab 03/02/18 0408  03/04/18 0440 03/04/18 1600 03/05/18 0348  AST 169*  --   --   --   --   ALT 23  --   --   --   --   ALKPHOS 109  --   --   --   --   BILITOT 2.3*  --   --   --   --   PROT 7.0  --   --   --   --   ALBUMIN 2.7*  2.7*   < > 2.2* 2.1* 1.9*   < > = values in this interval not displayed.   Recent Labs  Lab 03/04/18 1507  LIPASE 181*   No results for input(s): AMMONIA in the last 168 hours. CBC: Recent Labs  Lab 03/02/18 0408 03/03/18 0300  03/04/18 0440 03/04/18 1944 03/05/18 0348  WBC 10.5 11.0*  --  14.7* 17.5* 19.8*  HGB 14.1 13.5   < > 12.7 12.4 12.0  HCT 43.8 39.8   < > 39.0 38.1 36.2  MCV 89.4 86.9  --  89.2 90.1 89.2  PLT 90* 80*  --  83* 87* 85*   < > = values in this interval not displayed.   Cardiac Enzymes: No results for input(s): CKTOTAL, CKMB, CKMBINDEX, TROPONINI  in the last 168 hours. CBG: Recent Labs  Lab 03/04/18 1531 03/04/18 1944 03/05/18 0004 03/05/18 0353 03/05/18 0801  GLUCAP 161* 200* 224* 247* 224*    Iron Studies: No results for input(s): IRON, TIBC, TRANSFERRIN, FERRITIN in the last 72 hours. Studies/Results: Dg Chest Port 1 View  Result Date: 03/05/2018 CLINICAL DATA:  Tachypnea EXAM: PORTABLE CHEST 1 VIEW COMPARISON:  03/03/2018 FINDINGS: Cardiac shadow remains enlarged. Aortic calcifications are again noted. Endotracheal tube, gastric catheter and right jugular central line are again seen and stable. Mild vascular congestion is again seen and stable. No significant interstitial edema is noted. Some patchy atelectatic changes are seen in the right lung. No bony abnormality noted. IMPRESSION: Stable vascular congestion with mild atelectatic changes on the right. Electronically Signed   By: Inez Catalina M.D.   On: 03/05/2018 08:23    Medications: Infusions: . sodium chloride 10 mL/hr at 03/04/18 1137  . ceFEPime (MAXIPIME) IV 1 g (03/04/18 1446)  . dexmedetomidine (PRECEDEX) IV infusion  Stopped (03/04/18 1002)  . feeding supplement (VITAL HIGH PROTEIN) 1,000 mL (03/04/18 1334)  . heparin Stopped (03/04/18 1411)  . linezolid (ZYVOX) IV Stopped (03/04/18 2243)  . phenylephrine (NEO-SYNEPHRINE) Adult infusion 20 mcg/min (03/05/18 9872)    Scheduled Medications: . calcium carbonate (dosed in mg elemental calcium)  1,000 mg of elemental calcium Per Tube TID  . chlorhexidine gluconate (MEDLINE KIT)  15 mL Mouth Rinse BID  . Chlorhexidine Gluconate Cloth  6 each Topical Daily  . Chlorhexidine Gluconate Cloth  6 each Topical Q0600  . feeding supplement (PRO-STAT SUGAR FREE 64)  30 mL Per Tube Daily  . HYDROmorphone  1 mg Per Tube Q8H  . insulin aspart  0-15 Units Subcutaneous Q4H  . levothyroxine  25 mcg Per Tube Q0600  . mouth rinse  15 mL Mouth Rinse 10 times per day  . pantoprazole sodium  40 mg Per Tube Q24H  . sodium chloride flush  10-40 mL Intracatheter Q12H    have reviewed scheduled and prn medications.  Physical Exam: General: intubated, lying on bed Heart:RRR, s1s2 nl, no rubs Lungs coarse breath sound b/l, no wheeze Abdomen:soft, Non-tender, non-distended Extremities:No edema Dialysis Access: Neck temporary IJ catheter, no discharge or redness around the catheter site.  Doralene Glanz Prasad Maretta Overdorf 03/05/2018,8:46 AM  LOS: 9 days

## 2018-03-05 NOTE — Progress Notes (Signed)
eLink Physician-Brief Progress Note Patient Name: [name] [name] DOB: March 20, 1930 MRN: [id number]   Date of Service  03/05/2018  HPI/Events of Note  Liquid stools with perianal skin breakdown  eICU Interventions  Order entered to insert Flexiseal tube        [employer] 03/05/2018, 5:59 AM

## 2018-03-05 NOTE — Progress Notes (Signed)
NAME:  [name] [name], MRN:  [id number], DOB:  Jul 24, 1930, LOS: 9 ADMISSION DATE:  02/12/2018, CONSULTATION DATE:  02/17/2018 REFERRING MD:  Martin Majestic - [facility]  CHIEF COMPLAINT:  SOB   Brief History   This is an 85 year old female who presented to [facility] with SOB, cough, weakness and found to have multiple metabolic derangements including AoCKD with Scr 6, BUN 445, K 7.2. Received temporizing measures and transferred to [facility] for further management.   Past Medical History  HTN, HLD, CHF, A.fib, CKD, DM, diverticulosis, GERD.  Significant Hospital Events   1/20 >Admitted to ICU  Consults:  Nephrology Cardiology  Procedures:  ETT 1/20 >  R IJ HD cath 1/20 >   Significant Diagnostic Tests:  CXR 1/20 > CM with pulmonary edema. Echo 1/20 >  Impressions:  - Compared to a prior study in 2019, the LVEF is further reduced to   20-25% with more severe RV hypokinesis, inferior and lateral LV   hypokinesis to akinesis and elevated biventricular filling   pressure.  CT head 03/01/18  IMPRESSION: No acute or traumatic finding. Chronic generalized atrophy.  Micro Data:  Blood 1/20 > NGTD Sputum 1/20 > Staphylococcus aureus and klebsiella pneumonia Urine 1/20 > <10K colonies, insignificant  Antimicrobials:  Zosyn 02/27/2018 > 03/03/18 Ancef >03/02/18 Fortaz > 03/03/18 Cefepime 1/28 >  Linezolid 1/28 >   Interim history/subjective:  Overnight patient was noted to have break down of the skin near her perineal area, flexiseal changed. No acute events overnight.  This morning patient is intermittently following commands. She is still on vasopressors. She has been afebrile overnight.   Objective   Blood pressure 108/64, pulse 62, temperature 98 F (36.7 C), temperature source Oral, resp. rate (!) 29, height 5\' 5"  (1.651 m), weight 81 kg, SpO2 97 %.    Vent Mode: PRVC FiO2 (%):  [40 %] 40 % Set Rate:  [18 bmp] 18 bmp Vt Set:  [450 mL] 450 mL PEEP:  [5 cmH20] 5 cmH20 Plateau Pressure:  [18 cmH20-21  cmH20] 18 cmH20   Intake/Output Summary (Last 24 hours) at 03/05/2018 0656 Last data filed at 03/05/2018 0600 Gross per 24 hour  Intake 2955.67 ml  Output 575 ml  Net 2380.67 ml   Filed Weights   03/03/18 1332 03/04/18 0445 03/05/18 0500  Weight: 77.2 kg 78.5 kg 81 kg    Examination: General: Intubated, sedated, ill appearing, intermittently following commands HENT: Central line in place, atraumatic, normocephalic Lungs: Bilateral rhonchi, normal work of breathing, on ventilator Cardiovascular: Regular rate, irregular rhythm, no m/r/g Abdomen: Soft, non-tender, no distension Extremities: Trace BL LE edema, warm hands, warm knees Neuro: Sedated, awake, not following commands GU: No foley in place  Resolved Hospital Problem list    Assessment & Plan:  Hypoxic respiratory distress 2/2 pulmonary edema Cardiogenic shock  Septic shock Metabolic acidosis: -Mixed cardiogenic and septic shock.  She has been having some waxing and waning of her alertness.  She has still been requiring vasopressors. She has been afebrile overnight but still has a leukocytosis.  -Cardiology evaluated, not a good candidate for invasive cardiac procedures. -Continue Precedex drip as needed -Lipase 181, obtain CT abd/pelvis? -Daily sedation vacation, daily SBT -Continue cefepime and linezolid per pharmacy. Already completed 7 day course of antibiotics -Repeat sputum culture has grown few gram + cocci and few gram - rods, cultures pending -Blood cultures pending -Continue dilaudid 1mg  q8 hr -Continue neosyneprhine drip, titrate down as tolerated  Vaginal bleeding: -Patient has  not had any vaginal bleeding since yesterday. We have held heparin. Her hgb has been stable.  -Bladder scan showed empty bladder -Hold heparin for now -Vagnial exam showed dark blood in vaginal canal, unable to define source.  -CBC q8 hours, will transfuse for Hgb <7 -Continue to monitor for now, can consider further imaging.      AoCKD: Hyperkalemia: -Nephrology following.  She remains anuric and has progressed to end-stage renal disease.   -Patient underwent dialysis yesterday.  BMP shows no need for immediate dialysis. -Intermittent hemodialysis per nephrology. Possibly   Transaminitis: Resolved, likely 2/2 shock liver  Diabetes mellitus: -Receiving tube feeds -SSI-resistant -Frequent CBGS  Hypothyroidism: -Continue synthroid 25 mcg daily  HTN, HLD, HFrEF Atrial fibrillation with RVR -Currently on heparin -Holding apixiban, lasix, toprol, and pravastatin  -Tried starting her on amiodarone drip for her atrial fibrillation however she became severely hypotensive and had to be placed in trandelenburg position and we had to increase her pressor support.  -Patient was started on digoxin, this was discontinued -Continue metoprolol 20 BID   Gout: -On allopurinol at home, currently holding this  GOC: -We have been discussing with the family her goals of care and plans if she does not improve. She is currently DNR but does not seem to be having much improvement. We will continue this discussion today.  -Family meeting planned for today  Best practice:  Diet: Tube feeds Pain/Anxiety/Delirium protocol (if indicated): Precedex, RASS goal 0 to -1. VAP protocol (if indicated): Yes DVT prophylaxis: SCDs holding heparin GI prophylaxis: PPI Glucose control: SSI Mobility: Bedrest Code Status: DNR Family Communication: Spoke with daughter  Disposition: Remain in ICU  Labs   CBC: Recent Labs  Lab 03/02/18 0408 03/03/18 0300 03/03/18 1747 03/04/18 0440 03/04/18 1944 03/05/18 0348  WBC 10.5 11.0*  --  14.7* 17.5* 19.8*  HGB 14.1 13.5 13.9 12.7 12.4 12.0  HCT 43.8 39.8 41.0 39.0 38.1 36.2  MCV 89.4 86.9  --  89.2 90.1 89.2  PLT 90* 80*  --  83* 87* 85*    Basic Metabolic Panel: Recent Labs  Lab 03/01/18 0311  03/02/18 0408 03/03/18 0300 03/03/18 1747 03/03/18 1855 03/04/18 0440 03/04/18 1600  03/05/18 0348  NA 133*   < > 132* 131* 130* 132* 131* 133* 131*  K 4.2   < > 5.2* 6.2* 4.2 4.4 5.0 4.2 4.3  CL 100   < > 95* 96*  --  95* 92* 93* 93*  CO2 23   < > 19* 16*  --  20* 20* 17* 16*  GLUCOSE 215*   < > 245* 208*  --  211* 188* 180* 258*  BUN 30*   < > 68* 127*  --  68* 98* 126* 145*  CREATININE 1.51*   < > 2.88* 4.64*  --  3.42* 4.19* 4.89* 5.62*  CALCIUM 7.9*   < > 8.1* 7.8*  --  8.0* 7.4* 7.3* 7.2*  MG 2.4  --  2.3 2.3  --   --  2.0  --  1.8  PHOS 2.2*   < > 4.1 4.9*  --  3.8 4.7* 5.9* 7.8*   < > = values in this interval not displayed.   GFR: Estimated Creatinine Clearance: 7.7 mL/min (A) (by C-G formula based on SCr of 5.62 mg/dL (H)). Recent Labs  Lab 03/03/18 0300 03/04/18 0440 03/04/18 1944 03/05/18 0348  WBC 11.0* 14.7* 17.5* 19.8*    Liver Function Tests: Recent Labs  Lab 03/02/18 0408 03/03/18 0300  03/03/18 1855 03/04/18 0440 03/04/18 1600 03/05/18 0348  AST 169*  --   --   --   --   --   ALT 23  --   --   --   --   --   ALKPHOS 109  --   --   --   --   --   BILITOT 2.3*  --   --   --   --   --   PROT 7.0  --   --   --   --   --   ALBUMIN 2.7*  2.7* 2.4* 2.3* 2.2* 2.1* 1.9*   Recent Labs  Lab 03/04/18 1507  LIPASE 181*   No results for input(s): AMMONIA in the last 168 hours.  ABG    Component Value Date/Time   PHART 7.492 (H) 03/03/2018 1747   PCO2ART 26.6 (L) 03/03/2018 1747   PO2ART 166.0 (H) 03/03/2018 1747   HCO3 20.2 03/03/2018 1747   TCO2 21 (L) 03/03/2018 1747   ACIDBASEDEF 1.0 03/03/2018 1747   O2SAT 100.0 03/03/2018 1747     Coagulation Profile: No results for input(s): INR, PROTIME in the last 168 hours.  Cardiac Enzymes: No results for input(s): CKTOTAL, CKMB, CKMBINDEX, TROPONINI in the last 168 hours.  HbA1C: Hgb A1c MFr Bld  Date/Time Value Ref Range Status  10/23/2017 03:06 PM 7.9 (H) 4.8 - 5.6 % Final    Comment:    (NOTE) Pre diabetes:          5.7%-6.4% Diabetes:              >6.4% Glycemic control for    <7.0% adults with diabetes   09/17/2010 05:27 AM 6.3 (H) <5.7 % Final    Comment:    (NOTE)                                                                       According to the ADA Clinical Practice Recommendations for 2011, when HbA1c is used as a screening test:  >=6.5%   Diagnostic of Diabetes Mellitus           (if abnormal result is confirmed) 5.7-6.4%   Increased risk of developing Diabetes Mellitus References:Diagnosis and Classification of Diabetes Mellitus,Diabetes JSHF,0263,78(HYIFO 1):S62-S69 and Standards of Medical Care in         Diabetes - 2011,Diabetes YDXA,1287,86 (Suppl 1):S11-S61.    CBG: Recent Labs  Lab 03/04/18 1103 03/04/18 1531 03/04/18 1944 03/05/18 0004 03/05/18 0353  GLUCAP 167* 161* 200* 224* 247*    Review of Systems:   Unable to obtain due to patients mental status  Past Medical History  She,  has a past medical history of Acid reflux, Atrial fibrillation ([place]), Cardiomyopathy ([place]), Chronic anticoagulation, Chronic combined systolic and diastolic CHF (congestive heart failure) ([place]), CKD (chronic kidney disease), stage IV ([place]), Diabetes mellitus type 2 in obese [facility]), Diverticular hemorrhage (2011), Diverticulosis (2011), GI bleeding (08/03/2011), Gout, medication noncompliance, Hypercholesterolemia, Hyperglycemia, Hypertension, Internal hemorrhoids (2011), Lower extremity edema, Seasonal allergies, and Thrombocytopenia due to drugs.   Surgical History    Past Surgical History:  Procedure Laterality Date  . ADENOIDECTOMY    . APPENDECTOMY    . BUNIONECTOMY    . CATARACT EXTRACTION    .  CESAREAN SECTION    . ESOPHAGOGASTRODUODENOSCOPY  08/20/09   small hiatal hernia/mild gastritis  . ileocolonoscopy  08/20/09   normal terminal ileum/pancolonic diverticula/no polyps/benign colon mucosa  . JOINT REPLACEMENT     Total right knee 2007  . LAPAROTOMY  09/22/2010   Procedure: EXPLORATORY LAPAROTOMY;  Surgeon: [name];  Location: [facility];   Service: General;  Laterality: N/A;  Lysis of Adhesions  . TONSILLECTOMY    . TUBAL LIGATION       Social History   reports that she has never smoked. She has never used smokeless tobacco. She reports that she does not drink alcohol or use drugs.   Family History   Her family history includes Diabetes in her mother; Heart disease in her mother.   Allergies Allergies  Allergen Reactions  . Codeine Nausea And Vomiting  . Sulfa Antibiotics Nausea And Vomiting  . Sulfur Nausea And Vomiting     Home Medications  Prior to Admission medications   Medication Sig Start Date End Date Taking? Authorizing Provider  acetaminophen (TYLENOL) 325 MG tablet Take 2 tablets (650 mg total) by mouth every 4 (four) hours as needed for headache or mild pain. 10/27/17  Yes Emokpae, Courage, MD  allopurinol (ZYLOPRIM) 100 MG tablet Take 100 mg by mouth daily.   Yes [provider]  amitriptyline (ELAVIL) 25 MG tablet Take 25 mg by mouth at bedtime as needed for sleep.  02/14/18  Yes [provider]  apixaban (ELIQUIS) 2.5 MG TABS tablet Take 1 tablet (2.5 mg total) by mouth 2 (two) times daily. 10/27/17  Yes Emokpae, Courage, MD  Artificial Tear Ointment (DRY EYES OP) Place 1 drop into both eyes as needed (dry eyes).   Yes [provider]  ferrous sulfate 325 (65 FE) MG tablet Take 325 mg by mouth 2 (two) times daily with a meal.    Yes [provider]  fluticasone (FLONASE) 50 MCG/ACT nasal spray Place 1 spray into both nostrils daily.   Yes [provider]  furosemide (LASIX) 40 MG tablet Take 1 tablet (40 mg total) by mouth daily. Patient taking differently: Take 20 mg by mouth daily.  10/27/17  Yes Emokpae, Courage, MD  glimepiride (AMARYL) 2 MG tablet Take 1 tablet (2 mg total) by mouth daily with breakfast. 10/28/17  Yes Emokpae, Courage, MD  levalbuterol (XOPENEX) 1.25 MG/3ML nebulizer solution Take 1.25 mg by nebulization every 4 (four) hours as needed for  wheezing. 09/10/17  Yes Valentina Shaggy, MD  loratadine (CLARITIN) 10 MG tablet Take 10 mg by mouth daily.   Yes [provider]  metoprolol succinate (TOPROL-XL) 25 MG 24 hr tablet Take 25 mg by mouth every morning.   Yes [provider]  montelukast (SINGULAIR) 10 MG tablet Take 10 mg by mouth at bedtime.    Yes [provider]  Multiple Vitamin (MULTIVITAMIN WITH MINERALS) TABS tablet Take 1 tablet by mouth daily.   Yes [provider]  omeprazole (PRILOSEC) 20 MG capsule Take 1 capsule (20 mg total) by mouth daily. Patient taking differently: Take 20 mg by mouth every morning.  06/19/16  Yes Maczis, Barth Kirks, PA-C  potassium chloride SA (K-DUR,KLOR-CON) 20 MEQ tablet Take 1 tablet (20 mEq total) by mouth daily. 04/01/16  Yes Isaac Bliss, Rayford Halsted, MD  pravastatin (PRAVACHOL) 40 MG tablet Take 40 mg by mouth every evening.    Yes [provider]  sodium chloride (OCEAN) 0.65 % SOLN nasal spray Place 1 spray  into both nostrils as needed for congestion.   Yes [provider]  Vitamin D, Ergocalciferol, (DRISDOL) 50000 units CAPS capsule Take 50,000 Units by mouth every 30 (thirty) days. Administered on the 1st of each month   Yes [provider]         Asencion Noble, M.D. PGY1 Pager [phone] 03/05/2018 6:56 AM

## 2018-03-06 DIAGNOSIS — A0472 Enterocolitis due to Clostridium difficile, not specified as recurrent: Secondary | ICD-10-CM

## 2018-03-06 LAB — RENAL FUNCTION PANEL
Albumin: 1.1 g/dL — ABNORMAL LOW (ref 3.5–5.0)
Albumin: 1.7 g/dL — ABNORMAL LOW (ref 3.5–5.0)
Anion gap: 11 (ref 5–15)
Anion gap: 16 — ABNORMAL HIGH (ref 5–15)
BUN: 58 mg/dL — ABNORMAL HIGH (ref 8–23)
BUN: 90 mg/dL — ABNORMAL HIGH (ref 8–23)
CO2: 15 mmol/L — ABNORMAL LOW (ref 22–32)
CO2: 17 mmol/L — ABNORMAL LOW (ref 22–32)
Calcium: 4.9 mg/dL — CL (ref 8.9–10.3)
Calcium: 7.8 mg/dL — ABNORMAL LOW (ref 8.9–10.3)
Chloride: 113 mmol/L — ABNORMAL HIGH (ref 98–111)
Chloride: 101 mmol/L (ref 98–111)
Creatinine, Ser: 2.75 mg/dL — ABNORMAL HIGH (ref 0.44–1.00)
Creatinine, Ser: 4.23 mg/dL — ABNORMAL HIGH (ref 0.44–1.00)
GFR calc Af Amer: 18 mL/min — ABNORMAL LOW (ref >60)
GFR calc Af Amer: 10 mL/min — ABNORMAL LOW (ref >60)
GFR calc non Af Amer: 15 mL/min — ABNORMAL LOW (ref >60)
GFR calc non Af Amer: 9 mL/min — ABNORMAL LOW (ref >60)
Glucose, Bld: 182 mg/dL — ABNORMAL HIGH (ref 70–99)
Glucose, Bld: 286 mg/dL — ABNORMAL HIGH (ref 70–99)
Phosphorus: 3 mg/dL (ref 2.5–4.6)
Phosphorus: 4.1 mg/dL (ref 2.5–4.6)
Potassium: 2.4 mmol/L — CL (ref 3.5–5.1)
Potassium: 4.9 mmol/L (ref 3.5–5.1)
Sodium: 139 mmol/L (ref 135–145)
Sodium: 134 mmol/L — ABNORMAL LOW (ref 135–145)

## 2018-03-06 LAB — CBC
HCT: 33.4 % — ABNORMAL LOW (ref 36.0–46.0)
HCT: 32.6 % — ABNORMAL LOW (ref 36.0–46.0)
Hemoglobin: 10.9 g/dL — ABNORMAL LOW (ref 12.0–15.0)
Hemoglobin: 10.4 g/dL — ABNORMAL LOW (ref 12.0–15.0)
MCH: 29 pg (ref 26.0–34.0)
MCH: 28.7 pg (ref 26.0–34.0)
MCHC: 32.6 g/dL (ref 30.0–36.0)
MCHC: 31.9 g/dL (ref 30.0–36.0)
MCV: 88.8 fL (ref 80.0–100.0)
MCV: 90.1 fL (ref 80.0–100.0)
Platelets: 85 10*3/uL — ABNORMAL LOW (ref 150–400)
Platelets: 86 10*3/uL — ABNORMAL LOW (ref 150–400)
RBC: 3.76 MIL/uL — ABNORMAL LOW (ref 3.87–5.11)
RBC: 3.62 MIL/uL — ABNORMAL LOW (ref 3.87–5.11)
RDW: 18.7 % — ABNORMAL HIGH (ref 11.5–15.5)
RDW: 18.6 % — ABNORMAL HIGH (ref 11.5–15.5)
WBC: 22.7 10*3/uL — ABNORMAL HIGH (ref 4.0–10.5)
WBC: 23.7 10*3/uL — ABNORMAL HIGH (ref 4.0–10.5)
nRBC: 0.2 % (ref 0.0–0.2)
nRBC: 0.3 % — ABNORMAL HIGH (ref 0.0–0.2)

## 2018-03-06 LAB — MAGNESIUM
Magnesium: 1.2 mg/dL — ABNORMAL LOW (ref 1.7–2.4)
Magnesium: 2.3 mg/dL (ref 1.7–2.4)

## 2018-03-06 LAB — GLUCOSE, CAPILLARY
Glucose-Capillary: 225 mg/dL — ABNORMAL HIGH (ref 70–99)
Glucose-Capillary: 231 mg/dL — ABNORMAL HIGH (ref 70–99)
Glucose-Capillary: 233 mg/dL — ABNORMAL HIGH (ref 70–99)
Glucose-Capillary: 239 mg/dL — ABNORMAL HIGH (ref 70–99)
Glucose-Capillary: 261 mg/dL — ABNORMAL HIGH (ref 70–99)
Glucose-Capillary: 270 mg/dL — ABNORMAL HIGH (ref 70–99)

## 2018-03-06 LAB — BASIC METABOLIC PANEL
Anion gap: 14 (ref 5–15)
BUN: 80 mg/dL — ABNORMAL HIGH (ref 8–23)
CO2: 17 mmol/L — ABNORMAL LOW (ref 22–32)
Calcium: 7.7 mg/dL — ABNORMAL LOW (ref 8.9–10.3)
Chloride: 103 mmol/L (ref 98–111)
Creatinine, Ser: 3.9 mg/dL — ABNORMAL HIGH (ref 0.44–1.00)
GFR calc Af Amer: 11 mL/min — ABNORMAL LOW (ref >60)
GFR calc non Af Amer: 10 mL/min — ABNORMAL LOW (ref >60)
Glucose, Bld: 251 mg/dL — ABNORMAL HIGH (ref 70–99)
Potassium: 4.4 mmol/L (ref 3.5–5.1)
Sodium: 134 mmol/L — ABNORMAL LOW (ref 135–145)

## 2018-03-06 MED ORDER — POTASSIUM CHLORIDE 20 MEQ/15ML (10%) PO SOLN
40.0000 meq | ORAL | Status: AC
  Administered 2018-03-06: 40 meq
  Filled 2018-03-06: qty 30

## 2018-03-06 MED ORDER — INSULIN GLARGINE 100 UNIT/ML ~~LOC~~ SOLN
15.0000 [IU] | Freq: Every day | SUBCUTANEOUS | Status: DC
  Administered 2018-03-06 – 2018-03-09 (×4): 15 [IU] via SUBCUTANEOUS
  Filled 2018-03-06 (×4): qty 0.15

## 2018-03-06 MED ORDER — CHLORHEXIDINE GLUCONATE CLOTH 2 % EX PADS: 6.0000 | MEDICATED_PAD | Freq: Every day | CUTANEOUS | Status: DC

## 2018-03-06 MED ORDER — MAGNESIUM SULFATE 2 GM/50ML IV SOLN
2.0000 g | Freq: Once | INTRAVENOUS | Status: AC
  Administered 2018-03-06: 2 g via INTRAVENOUS
  Filled 2018-03-06: qty 50

## 2018-03-06 MED ORDER — POTASSIUM CHLORIDE 20 MEQ/15ML (10%) PO SOLN
40.0000 meq | Freq: Two times a day (BID) | ORAL | Status: DC
  Administered 2018-03-06: 40 meq
  Filled 2018-03-06: qty 30

## 2018-03-06 MED ORDER — POTASSIUM CHLORIDE 10 MEQ/50ML IV SOLN
10.0000 meq | INTRAVENOUS | Status: AC
  Administered 2018-03-06 (×3): 10 meq via INTRAVENOUS
  Filled 2018-03-06 (×3): qty 50

## 2018-03-06 MED ORDER — VANCOMYCIN 50 MG/ML ORAL SOLUTION
125.0000 mg | Freq: Four times a day (QID) | ORAL | Status: DC
  Administered 2018-03-06 – 2018-03-09 (×14): 125 mg via ORAL
  Filled 2018-03-06 (×17): qty 2.5

## 2018-03-06 MED ORDER — CALCIUM GLUCONATE-NACL 2-0.675 GM/100ML-% IV SOLN
2.0000 g | Freq: Once | INTRAVENOUS | Status: AC
  Administered 2018-03-06: 2000 mg via INTRAVENOUS
  Filled 2018-03-06: qty 100

## 2018-03-06 MED ORDER — LACTATED RINGERS IV BOLUS
500.0000 mL | Freq: Once | INTRAVENOUS | Status: AC
  Administered 2018-03-06: 500 mL via INTRAVENOUS

## 2018-03-06 NOTE — Progress Notes (Signed)
CRITICAL VALUE ALERT  Critical Value:  K 2.4 Ca 4.9  Date & Time Notied:  03/06/2018 745  Provider Notified: Dr. Lonia Skinner  Orders Received/Actions taken: See provider notes/orders for replacements

## 2018-03-06 NOTE — Progress Notes (Signed)
[facility] ROUNDING NOTE   Subjective:   Acute hypoxic respiratory failure secondary to MRSA and Klebsiella pneumonia.  Continues on the nasal lid and cefepime antibiotics.  History of cardiomyopathy, atrial fibrillation chronic anticoagulation chronic kidney disease stage IV.  She also has a history of hypertension diabetes hyperlipidemia and thrombocytopenia.  Baseline creatinine appears to be between 2.5 and 3.0 mg/dL in September 2019.  She was treated with CRRT 03/05/2018 -03/01/2018    and transitioned to intermittent hemodialysis she appears to have had dialysis 03/03/2018 and 03/05/2018.  Blood pressure 99/77 pulse 96 temperature 99.2 O2 sats 98% FiO2 30%.  Anuric renal failure   Sodium 139 potassium 2.4 chloride 113 CO2 15 glucose 182 BUN 58 creatinine 2.75 calcium 4.9 magnesium 1.2 albumin 1.1 WBC 23.7 hemoglobin 10.4 platelets 86  Corrected calcium will be about 7.9-8.4 still low.  Objective:  Vital signs in last 24 hours:  Temp:  [97.6 F (36.4 C)-99.2 F (37.3 C)] 99.2 F (37.3 C) (01/30 0741) Pulse Rate:  [44-119] 108 (01/30 1000) Resp:  [20-35] 34 (01/30 1000) BP: (70-142)/(41-129) 110/48 (01/30 1000) SpO2:  [95 %-100 %] 98 % (01/30 1000) FiO2 (%):  [30 %-40 %] 30 % (01/30 0717) Weight:  [83 kg] 83 kg (01/30 0500)  Weight change: 2 kg Filed Weights   03/04/18 0445 03/05/18 0500 03/06/18 0500  Weight: 78.5 kg 81 kg 83 kg    Intake/Output: I/O last 3 completed shifts: In: 4152.4 [I.V.:702.1; NG/GT:2605; IV Piggyback:845.3] Out: 5625 [Other:255; Stool:1050]   Intake/Output this shift:  Total I/O In: 210 [NG/GT:210] Out: -   CVS-irregular rate and rhythm.  No murmurs rubs gallops audible RS-clear to auscultation anterior diminished air entry at bases ABD- BS present soft non-distended EXT-trace edema  lower extremities   Basic Metabolic Panel: Recent Labs  Lab 03/02/18 0408 03/03/18 0300  03/04/18 0440 03/04/18 1600 03/05/18 0348  03/05/18 1341 03/05/18 1600 03/06/18 0618  NA 132* 131*   < > 131* 133* 131* 130* 134* 139  K 5.2* 6.2*   < > 5.0 4.2 4.3 4.3 3.3* 2.4*  CL 95* 96*   < > 92* 93* 93* 93* 96* 113*  CO2 19* 16*   < > 20* 17* 16* 15* 21* 15*  GLUCOSE 245* 208*   < > 188* 180* 258* 316* 128* 182*  BUN 68* 127*   < > 98* 126* 145* 159* 48* 58*  CREATININE 2.88* 4.64*   < > 4.19* 4.89* 5.62* 5.94* 2.37* 2.75*  CALCIUM 8.1* 7.8*   < > 7.4* 7.3* 7.2* 7.3* 7.4* 4.9*  MG 2.3 2.3  --  2.0  --  1.8  --   --  1.2*  PHOS 4.1 4.9*   < > 4.7* 5.9* 7.8* 8.3* 2.7 3.0   < > = values in this interval not displayed.    Liver Function Tests: Recent Labs  Lab 03/02/18 0408  03/04/18 1600 03/05/18 0348 03/05/18 1341 03/05/18 1600 03/06/18 0618  AST 169*  --   --   --   --   --   --   ALT 23  --   --   --   --   --   --   ALKPHOS 109  --   --   --   --   --   --   BILITOT 2.3*  --   --   --   --   --   --   PROT 7.0  --   --   --   --   --   --  ALBUMIN 2.7*  2.7*   < > 2.1* 1.9* 1.9* 2.1* 1.1*   < > = values in this interval not displayed.   Recent Labs  Lab 03/04/18 1507  LIPASE 181*   No results for input(s): AMMONIA in the last 168 hours.  CBC: Recent Labs  Lab 03/05/18 0348 03/05/18 0901 03/05/18 1707 03/06/18 0100 03/06/18 0358  WBC 19.8* 20.6* 22.5* 22.7* 23.7*  HGB 12.0 11.9* 12.6 10.9* 10.4*  HCT 36.2 36.4 37.4 33.4* 32.6*  MCV 89.2 89.4 86.0 88.8 90.1  PLT 85* 87* 92* 85* 86*    Cardiac Enzymes: No results for input(s): CKTOTAL, CKMB, CKMBINDEX, TROPONINI in the last 168 hours.  BNP: Invalid input(s): POCBNP  CBG: Recent Labs  Lab 03/05/18 1555 03/05/18 1938 03/05/18 2307 03/06/18 0349 03/06/18 0737  GLUCAP 121* 149* 228* 43* 225*    Microbiology: Results for orders placed or performed during the hospital encounter of 02/06/2018  MRSA PCR Screening     Status: Abnormal   Collection Time: 02/16/2018  8:24 PM  Result Value Ref Range Status   MRSA by PCR POSITIVE (A)  NEGATIVE Final    Comment:        The GeneXpert MRSA Assay (FDA approved for NASAL specimens only), is one component of a comprehensive MRSA colonization surveillance program. It is not intended to diagnose MRSA infection nor to guide or monitor treatment for MRSA infections. RESULT CALLED TO, READ BACK BY AND VERIFIED WITH: M.HOOPER,RN AT 2321 BY L.PITT 02/28/2018   Culture, blood (routine x 2)     Status: None   Collection Time: 02/20/2018  9:54 PM  Result Value Ref Range Status   Specimen Description BLOOD LEFT ANTECUBITAL  Final   Special Requests   Final    BOTTLES DRAWN AEROBIC ONLY Blood Culture results may not be optimal due to an inadequate volume of blood received in culture bottles   Culture   Final    NO GROWTH 5 DAYS Performed at [facility], [place] [address], [place], [place] [address]    Report Status 03/02/2018 FINAL  Final  Culture, blood (routine x 2)     Status: None   Collection Time: 02/07/2018  9:54 PM  Result Value Ref Range Status   Specimen Description BLOOD LEFT WRIST  Final   Special Requests   Final    BOTTLES DRAWN AEROBIC ONLY Blood Culture results may not be optimal due to an inadequate volume of blood received in culture bottles   Culture   Final    NO GROWTH 5 DAYS Performed at [facility], [place] [address], [place], [place] [address]    Report Status 03/02/2018 FINAL  Final  Respiratory Panel by PCR     Status: None   Collection Time: 02/17/2018 10:29 PM  Result Value Ref Range Status   Adenovirus NOT DETECTED NOT DETECTED Final   Coronavirus 229E NOT DETECTED NOT DETECTED Final   Coronavirus HKU1 NOT DETECTED NOT DETECTED Final   Coronavirus NL63 NOT DETECTED NOT DETECTED Final   Coronavirus OC43 NOT DETECTED NOT DETECTED Final   Metapneumovirus NOT DETECTED NOT DETECTED Final   Rhinovirus / Enterovirus NOT DETECTED NOT DETECTED Final   Influenza A NOT DETECTED NOT DETECTED Final   Influenza B NOT DETECTED NOT DETECTED Final    Parainfluenza Virus 1 NOT DETECTED NOT DETECTED Final   Parainfluenza Virus 2 NOT DETECTED NOT DETECTED Final   Parainfluenza Virus 3 NOT DETECTED NOT DETECTED Final   Parainfluenza  Virus 4 NOT DETECTED NOT DETECTED Final   Respiratory Syncytial Virus NOT DETECTED NOT DETECTED Final   Bordetella pertussis NOT DETECTED NOT DETECTED Final   Chlamydophila pneumoniae NOT DETECTED NOT DETECTED Final   Mycoplasma pneumoniae NOT DETECTED NOT DETECTED Final    Comment: Performed at [facility], [place] [address] [place], [place] [address]  Urine culture     Status: Abnormal   Collection Time: 02/25/18 12:12 AM  Result Value Ref Range Status   Specimen Description URINE, CATHETERIZED  Final   Special Requests NONE  Final   Culture (A)  Final    <10,000 COLONIES/mL INSIGNIFICANT GROWTH Performed at [place] [address], [place], [place] [address]    Report Status 02/26/2018 FINAL  Final  Culture, respiratory (non-expectorated)     Status: None   Collection Time: 02/25/18  4:09 AM  Result Value Ref Range Status   Specimen Description TRACHEAL ASPIRATE  Final   Special Requests   Final    NONE Performed at [facility], [place] [address], [place], [place] [address]    Gram Stain   Final    RARE WBC PRESENT, PREDOMINANTLY MONONUCLEAR FEW GRAM POSITIVE COCCI    Culture   Final    ABUNDANT METHICILLIN RESISTANT STAPHYLOCOCCUS AUREUS MODERATE KLEBSIELLA PNEUMONIAE    Report Status 02/27/2018 FINAL  Final   Organism ID, Bacteria METHICILLIN RESISTANT STAPHYLOCOCCUS AUREUS  Final   Organism ID, Bacteria KLEBSIELLA PNEUMONIAE  Final      Susceptibility   Klebsiella pneumoniae - MIC*    AMPICILLIN >=32 RESISTANT Resistant     CEFAZOLIN <=4 SENSITIVE Sensitive     CEFEPIME <=1 SENSITIVE Sensitive     CEFTAZIDIME <=1 SENSITIVE Sensitive     CEFTRIAXONE <=1 SENSITIVE Sensitive     CIPROFLOXACIN <=0.25 SENSITIVE Sensitive     GENTAMICIN <=1 SENSITIVE Sensitive      IMIPENEM <=0.25 SENSITIVE Sensitive     TRIMETH/SULFA <=20 SENSITIVE Sensitive     AMPICILLIN/SULBACTAM 4 SENSITIVE Sensitive     PIP/TAZO <=4 SENSITIVE Sensitive     Extended ESBL NEGATIVE Sensitive     * MODERATE KLEBSIELLA PNEUMONIAE   Methicillin resistant staphylococcus aureus - MIC*    CIPROFLOXACIN >=8 RESISTANT Resistant     ERYTHROMYCIN >=8 RESISTANT Resistant     GENTAMICIN <=0.5 SENSITIVE Sensitive     OXACILLIN >=4 RESISTANT Resistant     TETRACYCLINE <=1 SENSITIVE Sensitive     VANCOMYCIN <=0.5 SENSITIVE Sensitive     TRIMETH/SULFA 80 RESISTANT Resistant     CLINDAMYCIN >=8 RESISTANT Resistant     RIFAMPIN <=0.5 SENSITIVE Sensitive     Inducible Clindamycin NEGATIVE Sensitive     * ABUNDANT METHICILLIN RESISTANT STAPHYLOCOCCUS AUREUS  Culture, respiratory (non-expectorated)     Status: None (Preliminary result)   Collection Time: 03/04/18  7:00 AM  Result Value Ref Range Status   Specimen Description TRACHEAL ASPIRATE  Final   Special Requests NONE  Final   Gram Stain   Final    MODERATE WBC PRESENT,BOTH PMN AND MONONUCLEAR FEW GRAM POSITIVE COCCI IN PAIRS FEW GRAM NEGATIVE RODS    Culture   Final    FEW KLEBSIELLA PNEUMONIAE FEW STAPHYLOCOCCUS AUREUS SUSCEPTIBILITIES TO FOLLOW Performed at [facility], [address], [place], [place] [address]    Report Status PENDING  Incomplete   Organism ID, Bacteria KLEBSIELLA PNEUMONIAE  Final      Susceptibility   Klebsiella pneumoniae - MIC*  AMPICILLIN >=32 RESISTANT Resistant     CEFAZOLIN <=4 SENSITIVE Sensitive     CEFEPIME <=1 SENSITIVE Sensitive     CEFTAZIDIME <=1 SENSITIVE Sensitive     CEFTRIAXONE <=1 SENSITIVE Sensitive     CIPROFLOXACIN <=0.25 SENSITIVE Sensitive     GENTAMICIN <=1 SENSITIVE Sensitive     IMIPENEM <=0.25 SENSITIVE Sensitive     TRIMETH/SULFA <=20 SENSITIVE Sensitive     AMPICILLIN/SULBACTAM 4 SENSITIVE Sensitive     PIP/TAZO <=4 SENSITIVE Sensitive     Extended ESBL NEGATIVE  Sensitive     * FEW KLEBSIELLA PNEUMONIAE  Blood culture (routine x 2)     Status: None (Preliminary result)   Collection Time: 03/04/18 11:20 AM  Result Value Ref Range Status   Specimen Description BLOOD LEFT HAND  Final   Special Requests   Final    BOTTLES DRAWN AEROBIC ONLY Blood Culture adequate volume   Culture   Final    NO GROWTH < 24 HOURS Performed at [facility], [place] [address], [place], [place] [address]    Report Status PENDING  Incomplete  Blood culture (routine x 2)     Status: None (Preliminary result)   Collection Time: 03/04/18 11:30 AM  Result Value Ref Range Status   Specimen Description BLOOD LEFT ARM  Final   Special Requests   Final    BOTTLES DRAWN AEROBIC ONLY Blood Culture adequate volume   Culture   Final    NO GROWTH < 24 HOURS Performed at [facility], [place] [address], [place], [place] [address]    Report Status PENDING  Incomplete  C difficile quick scan w PCR reflex     Status: Abnormal   Collection Time: 03/05/18  9:27 AM  Result Value Ref Range Status   C Diff antigen POSITIVE (A) NEGATIVE Final   C Diff toxin NEGATIVE NEGATIVE Final   C Diff interpretation Results are indeterminate. See PCR results.  Final    Comment: Performed at [facility], [place] [address] [place], [place] [address]  C. Diff by PCR, Reflexed     Status: Abnormal   Collection Time: 03/05/18  9:27 AM  Result Value Ref Range Status   Toxigenic C. Difficile by PCR POSITIVE (A) NEGATIVE Final    Comment: Positive for toxigenic C. difficile with little to no toxin production. Only treat if clinical presentation suggests symptomatic illness. Performed at [facility], [place] [address] [place],  [address]     Coagulation Studies: No results for input(s): LABPROT, INR in the last 72 hours.  Urinalysis: No results for input(s): COLORURINE, LABSPEC, PHURINE, GLUCOSEU, HGBUR, BILIRUBINUR, KETONESUR, PROTEINUR, UROBILINOGEN, NITRITE, LEUKOCYTESUR in  the last 72 hours.  Invalid input(s): APPERANCEUR    Imaging: Dg Chest Port 1 View  Result Date: 03/05/2018 CLINICAL DATA:  Tachypnea EXAM: PORTABLE CHEST 1 VIEW COMPARISON:  03/03/2018 FINDINGS: Cardiac shadow remains enlarged. Aortic calcifications are again noted. Endotracheal tube, gastric catheter and right jugular central line are again seen and stable. Mild vascular congestion is again seen and stable. No significant interstitial edema is noted. Some patchy atelectatic changes are seen in the right lung. No bony abnormality noted. IMPRESSION: Stable vascular congestion with mild atelectatic changes on the right. Electronically Signed   By: Inez Catalina M.D.   On: 03/05/2018 08:23     Medications:   . sodium chloride 10 mL/hr at 03/04/18 1137  . sodium chloride    . sodium chloride    .  ceFEPime (MAXIPIME) IV Stopped (03/05/18 1421)  . feeding supplement (VITAL HIGH PROTEIN) 1,000 mL (03/05/18 1046)  . lactated ringers 500 mL (03/06/18 1000)  . magnesium sulfate 1 - 4 g bolus IVPB    . phenylephrine (NEO-SYNEPHRINE) Adult infusion 30 mcg/min (03/06/18 0600)   . aspirin  81 mg Per Tube Daily  . calcium carbonate (dosed in mg elemental calcium)  1,000 mg of elemental calcium Per Tube TID  . chlorhexidine gluconate (MEDLINE KIT)  15 mL Mouth Rinse BID  . Chlorhexidine Gluconate Cloth  6 each Topical Daily  . feeding supplement (PRO-STAT SUGAR FREE 64)  30 mL Per Tube Daily  . HYDROmorphone  1 mg Per Tube Q8H  . insulin aspart  0-15 Units Subcutaneous Q4H  . insulin glargine  15 Units Subcutaneous Daily  . levothyroxine  25 mcg Per Tube Q0600  . mouth rinse  15 mL Mouth Rinse 10 times per day  . pantoprazole sodium  40 mg Per Tube Q24H  . sodium chloride flush  10-40 mL Intracatheter Q12H  . vancomycin  125 mg Oral QID   sodium chloride, sodium chloride, acetaminophen (TYLENOL) oral liquid 160 mg/5 mL, alteplase, heparin, heparin, lidocaine (PF), lidocaine-prilocaine,  pentafluoroprop-tetrafluoroeth, sodium chloride, sodium chloride flush  Assessment/ Plan:   Acute hypoxic respiratory failure secondary MRSA and Klebsiella pneumonia.  Treated with a nasal lid and cefepime.  Intubated ventilated and sedated.  Right temporary hemodialysis catheter placed 7/61/5183   Acute diastolic heart failure ejection fraction 20 to 25% with infrolateral hypokinesis.  Has been evaluated by cardiology and not thought to be a cardiac catheterization candidate.  Acute renal failure patient has been transitioned to intermittent hemodialysis tolerated with very little fluid removal 03/05/2018.  We will plan further dialysis 03/07/2018  Hypokalemia we will need to repeat suggest runs of potassium chloride x3 recheck   Hypomagnesemia patient will need repletion  Hypocalcemia replete with IV calcium gluconate  Sepsis patient treated with antibiotics coverage for MRSA and Klebsiella pneumonia   LOS: [place] @TODAY @10 :27 AM

## 2018-03-06 NOTE — Progress Notes (Addendum)
NAME:  [name] [name], MRN:  [id number], DOB:  27-Jan-1931, LOS: 43 ADMISSION DATE:  02/17/2018, CONSULTATION DATE:  02/23/2018 REFERRING MD:  Martin Majestic - [facility]  CHIEF COMPLAINT:  SOB   Brief History   This is an 85 year old female who presented to [facility] with SOB, cough, weakness and found to have multiple metabolic derangements including AoCKD with Scr 6, BUN 445, K 7.2. Received temporizing measures and transferred to [facility] for further management.   Past Medical History  HTN, HLD, CHF, A.fib, CKD, DM, diverticulosis, GERD.  Significant Hospital Events   1/20 >Admitted to ICU  Consults:  Nephrology Cardiology  Procedures:  ETT 1/20 >  R IJ HD cath 1/20 >   Significant Diagnostic Tests:  CXR 1/20 > CM with pulmonary edema. Echo 1/20 >  Impressions:  - Compared to a prior study in 2019, the LVEF is further reduced to   20-25% with more severe RV hypokinesis, inferior and lateral LV   hypokinesis to akinesis and elevated biventricular filling   pressure.  CT head 03/01/18  IMPRESSION: No acute or traumatic finding. Chronic generalized atrophy.  Micro Data:  Blood 1/20 > NGTD Sputum 1/20 > Staphylococcus aureus and klebsiella pneumonia Urine 1/20 > <10K colonies, insignificant  Antimicrobials:  Zosyn 02/09/2018 > 03/03/18 Ancef >03/02/18 Fortaz > 03/03/18 Cefepime 1/28 >  Linezolid 1/28 >   Interim history/subjective:  No acute events overnight.  Patient was noted to be positive for C. difficile yesterday.  Today she is awake and alert.  Following commands.  Spoke with daughter, [name] [name] at bedside and the family did come to the conclusion that after we extubate if she does not tolerate it desaturates they would not want [place] to put the tube back in.  Discussed with her that we will continue our current treatment for now and may consider extubation tomorrow depending on how she does.  Objective   Blood pressure (!) 94/52, pulse (!) 101, temperature 98.8 F (37.1 C), temperature source  Oral, resp. rate (!) 29, height 5\' 5"  (1.651 m), weight 83 kg, SpO2 99 %.    Vent Mode: PRVC FiO2 (%):  [40 %] 40 % Set Rate:  [18 bmp] 18 bmp Vt Set:  [450 mL] 450 mL PEEP:  [5 cmH20] 5 cmH20 Pressure Support:  [10 cmH20] 10 cmH20 Plateau Pressure:  [15 cmH20-19 cmH20] 18 cmH20   Intake/Output Summary (Last 24 hours) at 03/06/2018 0647 Last data filed at 03/06/2018 0600 Gross per 24 hour  Intake 2881.46 ml  Output 1305 ml  Net 1576.46 ml   Filed Weights   03/04/18 0445 03/05/18 0500 03/06/18 0500  Weight: 78.5 kg 81 kg 83 kg    Examination: General: Intubated, sedated, ill appearing, following commands HENT: Central line in place, atraumatic, normocephalic Lungs: Bilateral rhonchi, normal work of breathing, on ventilator Cardiovascular: Regular rate, irregular rhythm, no m/r/g Abdomen: Soft, non-tender, no distension Extremities: Trace BL LE edema, warm hands, warm knees Neuro: Sedated, awake, not following commands GU: No foley in place  Resolved Hospital Problem list    Assessment & Plan:  Hypoxic respiratory distress 2/2 pulmonary edema Cardiogenic shock  Septic shock Metabolic acidosis: -Mixed cardiogenic and septic shock.  She has been having some waxing and waning of her alertness. Today she is doing well, more alert and following commands. She was noted to have + c diff yesterday, started on oral vancomycin. She still has a leukocytosis, remains afebrile. She was restarted on neosynephrine drip yesterday after dialysis  due to hypotension.  Respiratory cultures have grown out few Klebsiella.  Repeat blood cultures have been NGTD.  She was also noted to have some electrolyte abnormalities.  We will correct these and recheck them later in the afternoon.  We can consider extubation tomorrow if her labs improved. -Cardiology evaluated, not a good candidate for invasive cardiac procedures. -Daily sedation vacation, daily SBT -500 cc LR -Continue cefepime  -Discontinue  linezolid -Repeat sputum culture has grown few gram + cocci and few gram - rods, few klebsiella -Blood cultures NGTD -Continue dilaudid 1mg  q8 hr -Continue neosyneprhine drip, wean down as tolerated -Oral vancomycin 125 every 6 hours  Vaginal bleeding: -Patient has not had any vaginal bleeding for 2 days. We have held heparin.  He did not have any drop in her hemoglobin to 10 from 12.6 yesterday.  No signs of bleeding on exam. -Continue to hold hold heparin -CBC q8 hours, will transfuse for Hgb <7 -Continue to monitor  AoCKD: -Nephrology following.  She remains anuric and has progressed to end-stage renal disease.   -Patient underwent dialysis yesterday.  BMP shows no need for immediate dialysis. -Intermittent hemodialysis per nephrology.   Hypokalemia Hypocalcemia: K down to 2.4, Cal 4.9. Replete.  -Repeat BMP after repletion   Transaminitis: Resolved, likely 2/2 shock liver  Diabetes mellitus: -Receiving tube feeds -SSI-resistant -Increase Lantus to 15 units daily -Frequent CBGS  Hypothyroidism: -Continue synthroid 25 mcg daily  HTN, HLD, HFrEF Atrial fibrillation with RVR -Currently on heparin -Holding apixiban, lasix, toprol, and pravastatin  -Tried starting her on amiodarone drip for her atrial fibrillation however she became severely hypotensive and had to be placed in trandelenburg position and we had to increase her pressor support.  -Patient was started on digoxin, this was discontinued -Will restart BB when BP improves  Gout: -On allopurinol at home, currently holding this  GOC: -Spoke with daughter [name] [name] today who reports that the family came to the decision that if [name] [name] is ready to be extubated and is unsuccessful then they would not want her to be re-intubated. They will just hope that she will tolerate it well. We discussed that we may consider extubation tomorrow.   Best practice:  Diet: Tube feeds Pain/Anxiety/Delirium protocol (if indicated):  Precedex, RASS goal 0 to -1. VAP protocol (if indicated): Yes DVT prophylaxis: SCDs holding heparin GI prophylaxis: PPI Glucose control: SSI Mobility: Bedrest Code Status: DNR Family Communication: Spoke with daughter  Disposition: Remain in ICU  Labs   CBC: Recent Labs  Lab 03/05/18 0348 03/05/18 0901 03/05/18 1707 03/06/18 0100 03/06/18 0358  WBC 19.8* 20.6* 22.5* 22.7* 23.7*  HGB 12.0 11.9* 12.6 10.9* 10.4*  HCT 36.2 36.4 37.4 33.4* 32.6*  MCV 89.2 89.4 86.0 88.8 90.1  PLT 85* 87* 92* 85* 86*    Basic Metabolic Panel: Recent Labs  Lab 03/01/18 0311  03/02/18 0408 03/03/18 0300  03/04/18 0440 03/04/18 1600 03/05/18 0348 03/05/18 1341 03/05/18 1600  NA 133*   < > 132* 131*   < > 131* 133* 131* 130* 134*  K 4.2   < > 5.2* 6.2*   < > 5.0 4.2 4.3 4.3 3.3*  CL 100   < > 95* 96*   < > 92* 93* 93* 93* 96*  CO2 23   < > 19* 16*   < > 20* 17* 16* 15* 21*  GLUCOSE 215*   < > 245* 208*   < > 188* 180* 258* 316* 128*  BUN 30*   < >  68* 127*   < > 98* 126* 145* 159* 48*  CREATININE 1.51*   < > 2.88* 4.64*   < > 4.19* 4.89* 5.62* 5.94* 2.37*  CALCIUM 7.9*   < > 8.1* 7.8*   < > 7.4* 7.3* 7.2* 7.3* 7.4*  MG 2.4  --  2.3 2.3  --  2.0  --  1.8  --   --   PHOS 2.2*   < > 4.1 4.9*   < > 4.7* 5.9* 7.8* 8.3* 2.7   < > = values in this interval not displayed.   GFR: Estimated Creatinine Clearance: 18.5 mL/min (A) (by C-G formula based on SCr of 2.37 mg/dL (H)). Recent Labs  Lab 03/05/18 0901 03/05/18 1707 03/06/18 0100 03/06/18 0358  WBC 20.6* 22.5* 22.7* 23.7*    Liver Function Tests: Recent Labs  Lab 03/02/18 0408  03/04/18 0440 03/04/18 1600 03/05/18 0348 03/05/18 1341 03/05/18 1600  AST 169*  --   --   --   --   --   --   ALT 23  --   --   --   --   --   --   ALKPHOS 109  --   --   --   --   --   --   BILITOT 2.3*  --   --   --   --   --   --   PROT 7.0  --   --   --   --   --   --   ALBUMIN 2.7*  2.7*   < > 2.2* 2.1* 1.9* 1.9* 2.1*   < > = values in this  interval not displayed.   Recent Labs  Lab 03/04/18 1507  LIPASE 181*   No results for input(s): AMMONIA in the last 168 hours.  ABG    Component Value Date/Time   PHART 7.492 (H) 03/03/2018 1747   PCO2ART 26.6 (L) 03/03/2018 1747   PO2ART 166.0 (H) 03/03/2018 1747   HCO3 20.2 03/03/2018 1747   TCO2 21 (L) 03/03/2018 1747   ACIDBASEDEF 1.0 03/03/2018 1747   O2SAT 100.0 03/03/2018 1747     Coagulation Profile: No results for input(s): INR, PROTIME in the last 168 hours.  Cardiac Enzymes: No results for input(s): CKTOTAL, CKMB, CKMBINDEX, TROPONINI in the last 168 hours.  HbA1C: Hgb A1c MFr Bld  Date/Time Value Ref Range Status  10/23/2017 03:06 PM 7.9 (H) 4.8 - 5.6 % Final    Comment:    (NOTE) Pre diabetes:          5.7%-6.4% Diabetes:              >6.4% Glycemic control for   <7.0% adults with diabetes   09/17/2010 05:27 AM 6.3 (H) <5.7 % Final    Comment:    (NOTE)                                                                       According to the ADA Clinical Practice Recommendations for 2011, when HbA1c is used as a screening test:  >=6.5%   Diagnostic of Diabetes Mellitus           (if abnormal result is confirmed) 5.7-6.4%   Increased risk of  developing Diabetes Mellitus References:Diagnosis and Classification of Diabetes Mellitus,Diabetes UPJS,3159,45(OPFYT 1):S62-S69 and Standards of Medical Care in         Diabetes - 2011,Diabetes [id number],2863,81 (Suppl 1):S11-S61.    CBG: Recent Labs  Lab 03/05/18 1142 03/05/18 1555 03/05/18 1938 03/05/18 2307 03/06/18 0349  GLUCAP 298* 121* 149* 228* 239*    Review of Systems:   Unable to obtain due to patients mental status  Past Medical History  She,  has a past medical history of Acid reflux, Atrial fibrillation ([place]), Cardiomyopathy ([place]), Chronic anticoagulation, Chronic combined systolic and diastolic CHF (congestive heart failure) ([place]), CKD (chronic kidney disease), stage IV ([place]), Diabetes  mellitus type 2 in obese [facility]), Diverticular hemorrhage (2011), Diverticulosis (2011), GI bleeding (08/03/2011), Gout, medication noncompliance, Hypercholesterolemia, Hyperglycemia, Hypertension, Internal hemorrhoids (2011), Lower extremity edema, Seasonal allergies, and Thrombocytopenia due to drugs.   Surgical History    Past Surgical History:  Procedure Laterality Date  . ADENOIDECTOMY    . APPENDECTOMY    . BUNIONECTOMY    . CATARACT EXTRACTION    . CESAREAN SECTION    . ESOPHAGOGASTRODUODENOSCOPY  08/20/09   small hiatal hernia/mild gastritis  . ileocolonoscopy  08/20/09   normal terminal ileum/pancolonic diverticula/no polyps/benign colon mucosa  . JOINT REPLACEMENT     Total right knee 2007  . LAPAROTOMY  09/22/2010   Procedure: EXPLORATORY LAPAROTOMY;  Surgeon: [name];  Location: [facility];  Service: General;  Laterality: N/A;  Lysis of Adhesions  . TONSILLECTOMY    . TUBAL LIGATION       Social History   reports that she has never smoked. She has never used smokeless tobacco. She reports that she does not drink alcohol or use drugs.   Family History   Her family history includes Diabetes in her mother; Heart disease in her mother.   Allergies Allergies  Allergen Reactions  . Codeine Nausea And Vomiting  . Sulfa Antibiotics Nausea And Vomiting  . Sulfur Nausea And Vomiting     Home Medications  Prior to Admission medications   Medication Sig Start Date End Date Taking? Authorizing Provider  acetaminophen (TYLENOL) 325 MG tablet Take 2 tablets (650 mg total) by mouth every 4 (four) hours as needed for headache or mild pain. 10/27/17  Yes Emokpae, Courage, MD  allopurinol (ZYLOPRIM) 100 MG tablet Take 100 mg by mouth daily.   Yes [provider]  amitriptyline (ELAVIL) 25 MG tablet Take 25 mg by mouth at bedtime as needed for sleep.  02/14/18  Yes [provider]  apixaban (ELIQUIS) 2.5 MG TABS tablet Take 1 tablet (2.5 mg total) by mouth 2 (two)  times daily. 10/27/17  Yes Emokpae, Courage, MD  Artificial Tear Ointment (DRY EYES OP) Place 1 drop into both eyes as needed (dry eyes).   Yes [provider]  ferrous sulfate 325 (65 FE) MG tablet Take 325 mg by mouth 2 (two) times daily with a meal.    Yes [provider]  fluticasone (FLONASE) 50 MCG/ACT nasal spray Place 1 spray into both nostrils daily.   Yes [provider]  furosemide (LASIX) 40 MG tablet Take 1 tablet (40 mg total) by mouth daily. Patient taking differently: Take 20 mg by mouth daily.  10/27/17  Yes Emokpae, Courage, MD  glimepiride (AMARYL) 2 MG tablet Take 1 tablet (2 mg total) by mouth daily with breakfast. 10/28/17  Yes Emokpae, Courage, MD  levalbuterol (XOPENEX) 1.25 MG/3ML nebulizer solution Take 1.25 mg by nebulization every 4 (four)  hours as needed for wheezing. 09/10/17  Yes Valentina Shaggy, MD  loratadine (CLARITIN) 10 MG tablet Take 10 mg by mouth daily.   Yes [provider]  metoprolol succinate (TOPROL-XL) 25 MG 24 hr tablet Take 25 mg by mouth every morning.   Yes [provider]  montelukast (SINGULAIR) 10 MG tablet Take 10 mg by mouth at bedtime.    Yes [provider]  Multiple Vitamin (MULTIVITAMIN WITH MINERALS) TABS tablet Take 1 tablet by mouth daily.   Yes [provider]  omeprazole (PRILOSEC) 20 MG capsule Take 1 capsule (20 mg total) by mouth daily. Patient taking differently: Take 20 mg by mouth every morning.  06/19/16  Yes Maczis, Barth Kirks, PA-C  potassium chloride SA (K-DUR,KLOR-CON) 20 MEQ tablet Take 1 tablet (20 mEq total) by mouth daily. 04/01/16  Yes Isaac Bliss, Rayford Halsted, MD  pravastatin (PRAVACHOL) 40 MG tablet Take 40 mg by mouth every evening.    Yes [provider]  sodium chloride (OCEAN) 0.65 % SOLN nasal spray Place 1 spray into both nostrils as needed for congestion.   Yes [provider]  Vitamin D, Ergocalciferol, (DRISDOL) 50000 units CAPS  capsule Take 50,000 Units by mouth every 30 (thirty) days. Administered on the 1st of each month   Yes [provider]         Asencion Noble, M.D. PGY1 Pager [phone] 03/06/2018 6:47 AM

## 2018-03-06 NOTE — Progress Notes (Signed)
NAME:  [name] [name], MRN:  [id number], DOB:  07/01/1930, LOS: 58 ADMISSION DATE:  03/04/2018, CONSULTATION DATE:  02/08/2018 REFERRING MD:  Martin Majestic - [facility]  CHIEF COMPLAINT:  SOB   Brief History   This is an 85 year old female who presented to [facility] with SOB, cough, weakness and found to have multiple metabolic derangements including AoCKD with Scr 6, BUN 445, K 7.2. Received temporizing measures and transferred to [facility] for further management.   Past Medical History  HTN, HLD, CHF, A.fib, CKD, DM, diverticulosis, GERD.  Significant Hospital Events   1/20 >Admitted to ICU  Consults:  Nephrology Cardiology  Procedures:  ETT 1/20 >  R IJ HD cath 1/20 >   Significant Diagnostic Tests:  CXR 1/20 > CM with pulmonary edema. Echo 1/20 >  Impressions:  - Compared to a prior study in 2019, the LVEF is further reduced to   20-25% with more severe RV hypokinesis, inferior and lateral LV   hypokinesis to akinesis and elevated biventricular filling   pressure.  CT head 03/01/18  IMPRESSION: No acute or traumatic finding. Chronic generalized atrophy.  Micro Data:  Blood 1/20 > NGTD Sputum 1/20 > Staphylococcus aureus and klebsiella pneumonia Urine 1/20 > <10K colonies, insignificant  Antimicrobials:  Zosyn 02/25/2018 > 03/03/18 Ancef >03/02/18 Fortaz > 03/03/18 Cefepime 1/28 >  Linezolid 1/28 >   Interim history/subjective:  No acute events overnight.   Today, patient is awake, alert, following commands. No family at bedside right now, spoke with daughter yesterday.    Objective   Blood pressure (!) 105/53, pulse 94, temperature 98.9 F (37.2 C), temperature source Oral, resp. rate (!) 29, height 5\' 5"  (1.651 m), weight 85.2 kg, SpO2 98 %.    Vent Mode: PRVC FiO2 (%):  [30 %] 30 % Set Rate:  [24 bmp] 24 bmp Vt Set:  [450 mL] 450 mL PEEP:  [5 cmH20] 5 cmH20 Pressure Support:  [5 cmH20-8 cmH20] 8 cmH20 Plateau Pressure:  [16 cmH20-20 cmH20] 16 cmH20   Intake/Output Summary (Last 24  hours) at 03/07/2018 2947 Last data filed at 03/07/2018 0600 Gross per 24 hour  Intake 2860.51 ml  Output 400 ml  Net 2460.51 ml   Filed Weights   03/05/18 0500 03/06/18 0500 03/07/18 0430  Weight: 81 kg 83 kg 85.2 kg    Examination: General: Intubated, chronically ill appearing, following commands HENT: Central line in place, atraumatic, normocephalic Lungs: CTA, normal work of breathing, on ventilator Cardiovascular: Regular rate, irregular rhythm, no m/r/g Abdomen: Soft, non-tender, no distension Extremities: Trace BL LE edema, warm hands, warm knees Neuro: Sedated, awake, following commands GU: No foley in place  Resolved Hospital Problem list    Assessment & Plan:  Hypoxic respiratory distress 2/2 pulmonary edema Cardiogenic shock  Septic shock Metabolic acidosis: -Mixed cardiogenic and septic shock.  She has been having some waxing and waning of her alertness. Today she is a lot more alert. Labs had been stable, leukocytosis has been improving. We are treating for c difficile infection. After having a famiyl conversation with the daughters they have come to the conclusion that if we extubate and she does not do well that they do not want [place] to re-intubate. Patient appears to be a lot more alert today and is following commands. Her labs have been stable today. She is not on any pressors today. She has been tolerating the SBT well and is taking in good TV.  -Cardiology evaluated, not a good candidate for  invasive cardiac procedures. -Daily sedation vacation, daily SBT, attempt to extubate later today -Continue cefepime  -Repeat sputum culture has grown few gram + cocci and few gram - rods, few klebsiella -Blood cultures NGTD -Continue dilaudid 1mg  q8 hr -Continue neosyneprhine drip as needed -Oral vancomycin 125 every 6 hours  Vaginal bleeding: -Vaginal bleeding has resolved. Hgb has been stable at 10. We are going to attempt to extubate today. When she is more stable we can  consider further imaging such as TVUS to evaluate for things like fibroids.  -Continue to hold hold heparin -CBC daily, will transfuse for Hgb <7 -Continue to monitor  AoCKD: -Nephrology following.  She remains anuric and has progressed to end-stage renal disease.   -Patient underwent dialysis yesterday.  BMP shows no need for immediate dialysis. -Intermittent hemodialysis per nephrology.   Hypokalemia Hypocalcemia: Labs stable today.  -Daily BMP   Transaminitis: Resolved, likely 2/2 shock liver  Diabetes mellitus: -Receiving tube feeds -SSI-resistant -Continue Lantus 15 units daily -Frequent CBGS  Hypothyroidism: -Continue synthroid 25 mcg daily  HTN, HLD, HFrEF Atrial fibrillation with RVR -Currently on heparin -Holding apixiban, lasix, toprol, and pravastatin  -Tried starting her on amiodarone drip for her atrial fibrillation however she became severely hypotensive and had to be placed in trandelenburg position and we had to increase her pressor support.  -Patient was started on digoxin, this was discontinued -Will restart BB when BP improves  Gout: -On allopurinol at home, currently holding this  GOC: -Spoke with daughter Melody today who reports that the family came to the decision that if Ms. Marucci is ready to be extubated and is unsuccessful then they would not want her to be re-intubated. They will just hope that she will tolerate it well.  -Will attempt extubation today  Best practice:  Diet: Hold tube feeds Pain/Anxiety/Delirium protocol (if indicated): Precedex, RASS goal 0 to -1. VAP protocol (if indicated): Yes DVT prophylaxis: SCDs holding heparin GI prophylaxis: PPI Glucose control: SSI Mobility: Bedrest Code Status: DNR Family Communication:  Disposition: Remain in ICU  Labs   CBC: Recent Labs  Lab 03/05/18 0348 03/05/18 0901 03/05/18 1707 03/06/18 0100 03/06/18 0358  WBC 19.8* 20.6* 22.5* 22.7* 23.7*  HGB 12.0 11.9* 12.6 10.9* 10.4*    HCT 36.2 36.4 37.4 33.4* 32.6*  MCV 89.2 89.4 86.0 88.8 90.1  PLT 85* 87* 92* 85* 86*    Basic Metabolic Panel: Recent Labs  Lab 03/03/18 0300  03/04/18 0440  03/05/18 0348 03/05/18 1341 03/05/18 1600 03/06/18 0618 03/06/18 1204 03/06/18 1600 03/06/18 1630  NA 131*   < > 131*   < > 131* 130* 134* 139 134* 134*  --   K 6.2*   < > 5.0   < > 4.3 4.3 3.3* 2.4* 4.4 4.9  --   CL 96*   < > 92*   < > 93* 93* 96* 113* 103 101  --   CO2 16*   < > 20*   < > 16* 15* 21* 15* 17* 17*  --   GLUCOSE 208*   < > 188*   < > 258* 316* 128* 182* 251* 286*  --   BUN 127*   < > 98*   < > 145* 159* 48* 58* 80* 90*  --   CREATININE 4.64*   < > 4.19*   < > 5.62* 5.94* 2.37* 2.75* 3.90* 4.23*  --   CALCIUM 7.8*   < > 7.4*   < > 7.2* 7.3* 7.4* 4.9*  7.7* 7.8*  --   MG 2.3  --  2.0  --  1.8  --   --  1.2*  --   --  2.3  PHOS 4.9*   < > 4.7*   < > 7.8* 8.3* 2.7 3.0  --  4.1  --    < > = values in this interval not displayed.   GFR: Estimated Creatinine Clearance: 10.5 mL/min (A) (by C-G formula based on SCr of 4.23 mg/dL (H)). Recent Labs  Lab 03/05/18 0901 03/05/18 1707 03/06/18 0100 03/06/18 0358  WBC 20.6* 22.5* 22.7* 23.7*    Liver Function Tests: Recent Labs  Lab 03/02/18 0408  03/05/18 0348 03/05/18 1341 03/05/18 1600 03/06/18 0618 03/06/18 1600  AST 169*  --   --   --   --   --   --   ALT 23  --   --   --   --   --   --   ALKPHOS 109  --   --   --   --   --   --   BILITOT 2.3*  --   --   --   --   --   --   PROT 7.0  --   --   --   --   --   --   ALBUMIN 2.7*  2.7*   < > 1.9* 1.9* 2.1* 1.1* 1.7*   < > = values in this interval not displayed.   Recent Labs  Lab 03/04/18 1507  LIPASE 181*   No results for input(s): AMMONIA in the last 168 hours.  ABG    Component Value Date/Time   PHART 7.492 (H) 03/03/2018 1747   PCO2ART 26.6 (L) 03/03/2018 1747   PO2ART 166.0 (H) 03/03/2018 1747   HCO3 20.2 03/03/2018 1747   TCO2 21 (L) 03/03/2018 1747   ACIDBASEDEF 1.0 03/03/2018  1747   O2SAT 100.0 03/03/2018 1747     Coagulation Profile: No results for input(s): INR, PROTIME in the last 168 hours.  Cardiac Enzymes: No results for input(s): CKTOTAL, CKMB, CKMBINDEX, TROPONINI in the last 168 hours.  HbA1C: Hgb A1c MFr Bld  Date/Time Value Ref Range Status  10/23/2017 03:06 PM 7.9 (H) 4.8 - 5.6 % Final    Comment:    (NOTE) Pre diabetes:          5.7%-6.4% Diabetes:              >6.4% Glycemic control for   <7.0% adults with diabetes   09/17/2010 05:27 AM 6.3 (H) <5.7 % Final    Comment:    (NOTE)                                                                       According to the ADA Clinical Practice Recommendations for 2011, when HbA1c is used as a screening test:  >=6.5%   Diagnostic of Diabetes Mellitus           (if abnormal result is confirmed) 5.7-6.4%   Increased risk of developing Diabetes Mellitus References:Diagnosis and Classification of Diabetes Mellitus,Diabetes JOAC,1660,63(KZSWF 1):S62-S69 and Standards of Medical Care in         Diabetes - 2011,Diabetes UXNA,3557,32 (Suppl 1):S11-S61.  CBG: Recent Labs  Lab 03/06/18 1143 03/06/18 1617 03/06/18 1939 03/06/18 2340 03/07/18 0425  GLUCAP 231* 261* 270* 233* 187*    Review of Systems:   Unable to obtain due to patients mental status  Past Medical History  She,  has a past medical history of Acid reflux, Atrial fibrillation ([place]), Cardiomyopathy ([place]), Chronic anticoagulation, Chronic combined systolic and diastolic CHF (congestive heart failure) ([place]), CKD (chronic kidney disease), stage IV ([place]), Diabetes mellitus type 2 in obese [facility]), Diverticular hemorrhage (2011), Diverticulosis (2011), GI bleeding (08/03/2011), Gout, medication noncompliance, Hypercholesterolemia, Hyperglycemia, Hypertension, Internal hemorrhoids (2011), Lower extremity edema, Seasonal allergies, and Thrombocytopenia due to drugs.   Surgical History    Past Surgical History:  Procedure Laterality  Date  . ADENOIDECTOMY    . APPENDECTOMY    . BUNIONECTOMY    . CATARACT EXTRACTION    . CESAREAN SECTION    . ESOPHAGOGASTRODUODENOSCOPY  08/20/09   small hiatal hernia/mild gastritis  . ileocolonoscopy  08/20/09   normal terminal ileum/pancolonic diverticula/no polyps/benign colon mucosa  . JOINT REPLACEMENT     Total right knee 2007  . LAPAROTOMY  09/22/2010   Procedure: EXPLORATORY LAPAROTOMY;  Surgeon: [name];  Location: [facility];  Service: General;  Laterality: N/A;  Lysis of Adhesions  . TONSILLECTOMY    . TUBAL LIGATION       Social History   reports that she has never smoked. She has never used smokeless tobacco. She reports that she does not drink alcohol or use drugs.   Family History   Her family history includes Diabetes in her mother; Heart disease in her mother.   Allergies Allergies  Allergen Reactions  . Codeine Nausea And Vomiting  . Sulfa Antibiotics Nausea And Vomiting  . Sulfur Nausea And Vomiting     Home Medications  Prior to Admission medications   Medication Sig Start Date End Date Taking? Authorizing Provider  acetaminophen (TYLENOL) 325 MG tablet Take 2 tablets (650 mg total) by mouth every 4 (four) hours as needed for headache or mild pain. 10/27/17  Yes Emokpae, Courage, MD  allopurinol (ZYLOPRIM) 100 MG tablet Take 100 mg by mouth daily.   Yes [provider]  amitriptyline (ELAVIL) 25 MG tablet Take 25 mg by mouth at bedtime as needed for sleep.  02/14/18  Yes [provider]  apixaban (ELIQUIS) 2.5 MG TABS tablet Take 1 tablet (2.5 mg total) by mouth 2 (two) times daily. 10/27/17  Yes Emokpae, Courage, MD  Artificial Tear Ointment (DRY EYES OP) Place 1 drop into both eyes as needed (dry eyes).   Yes [provider]  ferrous sulfate 325 (65 FE) MG tablet Take 325 mg by mouth 2 (two) times daily with a meal.    Yes [provider]  fluticasone (FLONASE) 50 MCG/ACT nasal spray Place 1 spray into both nostrils  daily.   Yes [provider]  furosemide (LASIX) 40 MG tablet Take 1 tablet (40 mg total) by mouth daily. Patient taking differently: Take 20 mg by mouth daily.  10/27/17  Yes Emokpae, Courage, MD  glimepiride (AMARYL) 2 MG tablet Take 1 tablet (2 mg total) by mouth daily with breakfast. 10/28/17  Yes Emokpae, Courage, MD  levalbuterol (XOPENEX) 1.25 MG/3ML nebulizer solution Take 1.25 mg by nebulization every 4 (four) hours as needed for wheezing. 09/10/17  Yes Valentina Shaggy, MD  loratadine (CLARITIN) 10 MG tablet Take 10 mg by mouth daily.   Yes [provider]  metoprolol succinate (TOPROL-XL)  25 MG 24 hr tablet Take 25 mg by mouth every morning.   Yes [provider]  montelukast (SINGULAIR) 10 MG tablet Take 10 mg by mouth at bedtime.    Yes [provider]  Multiple Vitamin (MULTIVITAMIN WITH MINERALS) TABS tablet Take 1 tablet by mouth daily.   Yes [provider]  omeprazole (PRILOSEC) 20 MG capsule Take 1 capsule (20 mg total) by mouth daily. Patient taking differently: Take 20 mg by mouth every morning.  06/19/16  Yes Maczis, Barth Kirks, PA-C  potassium chloride SA (K-DUR,KLOR-CON) 20 MEQ tablet Take 1 tablet (20 mEq total) by mouth daily. 04/01/16  Yes Isaac Bliss, Rayford Halsted, MD  pravastatin (PRAVACHOL) 40 MG tablet Take 40 mg by mouth every evening.    Yes [provider]  sodium chloride (OCEAN) 0.65 % SOLN nasal spray Place 1 spray into both nostrils as needed for congestion.   Yes [provider]  Vitamin D, Ergocalciferol, (DRISDOL) 50000 units CAPS capsule Take 50,000 Units by mouth every 30 (thirty) days. Administered on the 1st of each month   Yes [provider]         Asencion Noble, M.D. PGY1 Pager [phone] 03/07/2018 6:42 AM

## 2018-03-07 LAB — CULTURE, RESPIRATORY W GRAM STAIN

## 2018-03-07 LAB — RENAL FUNCTION PANEL
Albumin: 1.7 g/dL — ABNORMAL LOW (ref 3.5–5.0)
Anion gap: 19 — ABNORMAL HIGH (ref 5–15)
BUN: 113 mg/dL — ABNORMAL HIGH (ref 8–23)
CO2: 17 mmol/L — ABNORMAL LOW (ref 22–32)
Calcium: 8.1 mg/dL — ABNORMAL LOW (ref 8.9–10.3)
Chloride: 98 mmol/L (ref 98–111)
Creatinine, Ser: 4.76 mg/dL — ABNORMAL HIGH (ref 0.44–1.00)
GFR calc Af Amer: 9 mL/min — ABNORMAL LOW (ref >60)
GFR calc non Af Amer: 8 mL/min — ABNORMAL LOW (ref >60)
Glucose, Bld: 218 mg/dL — ABNORMAL HIGH (ref 70–99)
Phosphorus: 4.8 mg/dL — ABNORMAL HIGH (ref 2.5–4.6)
Potassium: 5 mmol/L (ref 3.5–5.1)
Sodium: 134 mmol/L — ABNORMAL LOW (ref 135–145)

## 2018-03-07 LAB — POTASSIUM: Potassium: 3.7 mmol/L (ref 3.5–5.1)

## 2018-03-07 LAB — CBC
HCT: 31.4 % — ABNORMAL LOW (ref 36.0–46.0)
Hemoglobin: 10.1 g/dL — ABNORMAL LOW (ref 12.0–15.0)
MCH: 28.6 pg (ref 26.0–34.0)
MCHC: 32.2 g/dL (ref 30.0–36.0)
MCV: 89 fL (ref 80.0–100.0)
Platelets: DECREASED 10*3/uL (ref 150–400)
RBC: 3.53 MIL/uL — ABNORMAL LOW (ref 3.87–5.11)
RDW: 18.9 % — ABNORMAL HIGH (ref 11.5–15.5)
WBC: 19.5 10*3/uL — ABNORMAL HIGH (ref 4.0–10.5)
nRBC: 0.1 % (ref 0.0–0.2)

## 2018-03-07 LAB — GLUCOSE, CAPILLARY
Glucose-Capillary: 187 mg/dL — ABNORMAL HIGH (ref 70–99)
Glucose-Capillary: 206 mg/dL — ABNORMAL HIGH (ref 70–99)
Glucose-Capillary: 185 mg/dL — ABNORMAL HIGH (ref 70–99)
Glucose-Capillary: 102 mg/dL — ABNORMAL HIGH (ref 70–99)
Glucose-Capillary: 104 mg/dL — ABNORMAL HIGH (ref 70–99)
Glucose-Capillary: 141 mg/dL — ABNORMAL HIGH (ref 70–99)

## 2018-03-07 LAB — MAGNESIUM: Magnesium: 2.3 mg/dL (ref 1.7–2.4)

## 2018-03-07 MED ORDER — ORAL CARE MOUTH RINSE
15.0000 mL | Freq: Two times a day (BID) | OROMUCOSAL | Status: DC
  Administered 2018-03-07 – 2018-03-08 (×4): 15 mL via OROMUCOSAL

## 2018-03-07 MED ORDER — VITAL 1.5 CAL PO LIQD
1000.0000 mL | ORAL | Status: DC
  Administered 2018-03-08: 1000 mL
  Filled 2018-03-07 (×3): qty 1000

## 2018-03-07 MED ORDER — FAMOTIDINE IN NACL 20-0.9 MG/50ML-% IV SOLN
20.0000 mg | INTRAVENOUS | Status: DC
  Administered 2018-03-07 – 2018-03-08 (×2): 20 mg via INTRAVENOUS
  Filled 2018-03-07 (×2): qty 50

## 2018-03-07 MED ORDER — CHLORHEXIDINE GLUCONATE 0.12 % MT SOLN
15.0000 mL | Freq: Two times a day (BID) | OROMUCOSAL | Status: DC
  Administered 2018-03-07 – 2018-03-09 (×4): 15 mL via OROMUCOSAL
  Filled 2018-03-07 (×2): qty 15

## 2018-03-07 NOTE — Progress Notes (Addendum)
[facility] ROUNDING NOTE   Subjective:   Acute hypoxic respiratory failure secondary to MRSA and Klebsiella pneumonia.  Continues on linezolid and cefepime antibiotics.  History of cardiomyopathy, atrial fibrillation chronic anticoagulation chronic kidney disease stage IV.  She also has a history of hypertension diabetes hyperlipidemia and thrombocytopenia.  Baseline creatinine appears to be between 2.5 and 3.0 mg/dL in September 2019.  She was treated with CRRT 02/09/2018 -03/01/2018    and transitioned to intermittent hemodialysis she had dialysis 03/03/2018 and 03/05/2018.  Blood pressure 99/77 pulse 96 temperature 98.9 O2 sats 98% FiO2 30%.  Anuric renal failure.  Per RN is to be extubated today.  She is DNR but family would want reintubation acutely if needed per RN.   Sodium 134 K 5.0  BUN 113  Cr 4.76  Ca 8.1  Alb 1.7   Objective:  Vital signs in last 24 hours:  Temp:  [98 F (36.7 C)-99.2 F (37.3 C)] 98.9 F (37.2 C) (01/31 0427) Pulse Rate:  [44-108] 94 (01/31 0600) Resp:  [25-39] 29 (01/31 0600) BP: (74-130)/(39-102) 105/53 (01/31 0615) SpO2:  [49 %-100 %] 98 % (01/31 0600) FiO2 (%):  [30 %] 30 % (01/31 0500) Weight:  [85.2 kg] 85.2 kg (01/31 0430)  Weight change: 2.2 kg Filed Weights   03/05/18 0500 03/06/18 0500 03/07/18 0430  Weight: 81 kg 83 kg 85.2 kg    Intake/Output: I/O last 3 completed shifts: In: 4084.6 [I.V.:535.2; NG/GT:2400; IV Piggyback:1149.4] Out: 775 [Stool:775]   Intake/Output this shift:  No intake/output data recorded.  CVS-irregular rate and rhythm.  No murmurs rubs gallops audible RS-clear to auscultation anterior diminished air entry at bases ABD- BS present soft non-distended EXT-trace edema  lower extremities   Basic Metabolic Panel: Recent Labs  Lab 03/04/18 0440  03/05/18 0348 03/05/18 1341 03/05/18 1600 03/06/18 0618 03/06/18 1204 03/06/18 1600 03/06/18 1630 03/07/18 0431  NA 131*   < > 131* 130* 134* 139  134* 134*  --  134*  K 5.0   < > 4.3 4.3 3.3* 2.4* 4.4 4.9  --  5.0  CL 92*   < > 93* 93* 96* 113* 103 101  --  98  CO2 20*   < > 16* 15* 21* 15* 17* 17*  --  17*  GLUCOSE 188*   < > 258* 316* 128* 182* 251* 286*  --  218*  BUN 98*   < > 145* 159* 48* 58* 80* 90*  --  113*  CREATININE 4.19*   < > 5.62* 5.94* 2.37* 2.75* 3.90* 4.23*  --  4.76*  CALCIUM 7.4*   < > 7.2* 7.3* 7.4* 4.9* 7.7* 7.8*  --  8.1*  MG 2.0  --  1.8  --   --  1.2*  --   --  2.3 2.3  PHOS 4.7*   < > 7.8* 8.3* 2.7 3.0  --  4.1  --  4.8*   < > = values in this interval not displayed.    Liver Function Tests: Recent Labs  Lab 03/02/18 0408  03/05/18 1341 03/05/18 1600 03/06/18 0618 03/06/18 1600 03/07/18 0431  AST 169*  --   --   --   --   --   --   ALT 23  --   --   --   --   --   --   ALKPHOS 109  --   --   --   --   --   --  BILITOT 2.3*  --   --   --   --   --   --   PROT 7.0  --   --   --   --   --   --   ALBUMIN 2.7*  2.7*   < > 1.9* 2.1* 1.1* 1.7* 1.7*   < > = values in this interval not displayed.   Recent Labs  Lab 03/04/18 1507  LIPASE 181*   No results for input(s): AMMONIA in the last 168 hours.  CBC: Recent Labs  Lab 03/05/18 0348 03/05/18 0901 03/05/18 1707 03/06/18 0100 03/06/18 0358  WBC 19.8* 20.6* 22.5* 22.7* 23.7*  HGB 12.0 11.9* 12.6 10.9* 10.4*  HCT 36.2 36.4 37.4 33.4* 32.6*  MCV 89.2 89.4 86.0 88.8 90.1  PLT 85* 87* 92* 85* 86*    Cardiac Enzymes: No results for input(s): CKTOTAL, CKMB, CKMBINDEX, TROPONINI in the last 168 hours.  BNP: Invalid input(s): POCBNP  CBG: Recent Labs  Lab 03/06/18 1143 03/06/18 1617 03/06/18 1939 03/06/18 2340 03/07/18 0425  GLUCAP 231* 261* 270* 88* 71*    Microbiology: Results for orders placed or performed during the hospital encounter of 02/21/2018  MRSA PCR Screening     Status: Abnormal   Collection Time: 02/14/2018  8:24 PM  Result Value Ref Range Status   MRSA by PCR POSITIVE (A) NEGATIVE Final    Comment:        The  GeneXpert MRSA Assay (FDA approved for NASAL specimens only), is one component of a comprehensive MRSA colonization surveillance program. It is not intended to diagnose MRSA infection nor to guide or monitor treatment for MRSA infections. RESULT CALLED TO, READ BACK BY AND VERIFIED WITH: M.HOOPER,RN AT 2321 BY L.PITT 02/23/2018   Culture, blood (routine x 2)     Status: None   Collection Time: 02/12/2018  9:54 PM  Result Value Ref Range Status   Specimen Description BLOOD LEFT ANTECUBITAL  Final   Special Requests   Final    BOTTLES DRAWN AEROBIC ONLY Blood Culture results may not be optimal due to an inadequate volume of blood received in culture bottles   Culture   Final    NO GROWTH 5 DAYS Performed at [facility], [place] [address] [place], [place] [address]    Report Status 03/02/2018 FINAL  Final  Culture, blood (routine x 2)     Status: None   Collection Time: 03/03/2018  9:54 PM  Result Value Ref Range Status   Specimen Description BLOOD LEFT WRIST  Final   Special Requests   Final    BOTTLES DRAWN AEROBIC ONLY Blood Culture results may not be optimal due to an inadequate volume of blood received in culture bottles   Culture   Final    NO GROWTH 5 DAYS Performed at [facility], [place] [address] [place], [place] [address]    Report Status 03/02/2018 FINAL  Final  Respiratory Panel by PCR     Status: None   Collection Time: 02/11/2018 10:29 PM  Result Value Ref Range Status   Adenovirus NOT DETECTED NOT DETECTED Final   Coronavirus 229E NOT DETECTED NOT DETECTED Final   Coronavirus HKU1 NOT DETECTED NOT DETECTED Final   Coronavirus NL63 NOT DETECTED NOT DETECTED Final   Coronavirus OC43 NOT DETECTED NOT DETECTED Final   Metapneumovirus NOT DETECTED NOT DETECTED Final   Rhinovirus / Enterovirus NOT DETECTED NOT DETECTED Final   Influenza A NOT DETECTED NOT DETECTED Final  Influenza B NOT DETECTED NOT DETECTED Final   Parainfluenza Virus 1 NOT DETECTED NOT  DETECTED Final   Parainfluenza Virus 2 NOT DETECTED NOT DETECTED Final   Parainfluenza Virus 3 NOT DETECTED NOT DETECTED Final   Parainfluenza Virus 4 NOT DETECTED NOT DETECTED Final   Respiratory Syncytial Virus NOT DETECTED NOT DETECTED Final   Bordetella pertussis NOT DETECTED NOT DETECTED Final   Chlamydophila pneumoniae NOT DETECTED NOT DETECTED Final   Mycoplasma pneumoniae NOT DETECTED NOT DETECTED Final    Comment: Performed at [facility], [place] [address] [place], [place] [address]  Urine culture     Status: Abnormal   Collection Time: 02/25/18 12:12 AM  Result Value Ref Range Status   Specimen Description URINE, CATHETERIZED  Final   Special Requests NONE  Final   Culture (A)  Final    <10,000 COLONIES/mL INSIGNIFICANT GROWTH Performed at [place] [address], [place], [place] [address]    Report Status 02/26/2018 FINAL  Final  Culture, respiratory (non-expectorated)     Status: None   Collection Time: 02/25/18  4:09 AM  Result Value Ref Range Status   Specimen Description TRACHEAL ASPIRATE  Final   Special Requests   Final    NONE Performed at [facility], [place] [address] [place], [place] [address]    Gram Stain   Final    RARE WBC PRESENT, PREDOMINANTLY MONONUCLEAR FEW GRAM POSITIVE COCCI    Culture   Final    ABUNDANT METHICILLIN RESISTANT STAPHYLOCOCCUS AUREUS MODERATE KLEBSIELLA PNEUMONIAE    Report Status 02/27/2018 FINAL  Final   Organism ID, Bacteria METHICILLIN RESISTANT STAPHYLOCOCCUS AUREUS  Final   Organism ID, Bacteria KLEBSIELLA PNEUMONIAE  Final      Susceptibility   Klebsiella pneumoniae - MIC*    AMPICILLIN >=32 RESISTANT Resistant     CEFAZOLIN <=4 SENSITIVE Sensitive     CEFEPIME <=1 SENSITIVE Sensitive     CEFTAZIDIME <=1 SENSITIVE Sensitive     CEFTRIAXONE <=1 SENSITIVE Sensitive     CIPROFLOXACIN <=0.25 SENSITIVE Sensitive     GENTAMICIN <=1 SENSITIVE Sensitive     IMIPENEM <=0.25 SENSITIVE Sensitive      TRIMETH/SULFA <=20 SENSITIVE Sensitive     AMPICILLIN/SULBACTAM 4 SENSITIVE Sensitive     PIP/TAZO <=4 SENSITIVE Sensitive     Extended ESBL NEGATIVE Sensitive     * MODERATE KLEBSIELLA PNEUMONIAE   Methicillin resistant staphylococcus aureus - MIC*    CIPROFLOXACIN >=8 RESISTANT Resistant     ERYTHROMYCIN >=8 RESISTANT Resistant     GENTAMICIN <=0.5 SENSITIVE Sensitive     OXACILLIN >=4 RESISTANT Resistant     TETRACYCLINE <=1 SENSITIVE Sensitive     VANCOMYCIN <=0.5 SENSITIVE Sensitive     TRIMETH/SULFA 80 RESISTANT Resistant     CLINDAMYCIN >=8 RESISTANT Resistant     RIFAMPIN <=0.5 SENSITIVE Sensitive     Inducible Clindamycin NEGATIVE Sensitive     * ABUNDANT METHICILLIN RESISTANT STAPHYLOCOCCUS AUREUS  Culture, respiratory (non-expectorated)     Status: None (Preliminary result)   Collection Time: 03/04/18  7:00 AM  Result Value Ref Range Status   Specimen Description TRACHEAL ASPIRATE  Final   Special Requests NONE  Final   Gram Stain   Final    MODERATE WBC PRESENT,BOTH PMN AND MONONUCLEAR FEW GRAM POSITIVE COCCI IN PAIRS FEW GRAM NEGATIVE RODS    Culture   Final    FEW KLEBSIELLA PNEUMONIAE FEW STAPHYLOCOCCUS AUREUS SUSCEPTIBILITIES TO FOLLOW Performed at [facility], [place] [address], [place], [place] [address]    Report Status PENDING  Incomplete   Organism ID, Bacteria KLEBSIELLA PNEUMONIAE  Final      Susceptibility   Klebsiella pneumoniae - MIC*    AMPICILLIN >=32 RESISTANT Resistant     CEFAZOLIN <=4 SENSITIVE Sensitive     CEFEPIME <=1 SENSITIVE Sensitive     CEFTAZIDIME <=1 SENSITIVE Sensitive     CEFTRIAXONE <=1 SENSITIVE Sensitive     CIPROFLOXACIN <=0.25 SENSITIVE Sensitive     GENTAMICIN <=1 SENSITIVE Sensitive     IMIPENEM <=0.25 SENSITIVE Sensitive     TRIMETH/SULFA <=20 SENSITIVE Sensitive     AMPICILLIN/SULBACTAM 4 SENSITIVE Sensitive     PIP/TAZO <=4 SENSITIVE Sensitive     Extended ESBL NEGATIVE Sensitive     * FEW KLEBSIELLA PNEUMONIAE   Blood culture (routine x 2)     Status: None (Preliminary result)   Collection Time: 03/04/18 11:20 AM  Result Value Ref Range Status   Specimen Description BLOOD LEFT HAND  Final   Special Requests   Final    BOTTLES DRAWN AEROBIC ONLY Blood Culture adequate volume   Culture   Final    NO GROWTH 2 DAYS Performed at [facility], [place] [address], [place], [place] [address]    Report Status PENDING  Incomplete  Blood culture (routine x 2)     Status: None (Preliminary result)   Collection Time: 03/04/18 11:30 AM  Result Value Ref Range Status   Specimen Description BLOOD LEFT ARM  Final   Special Requests   Final    BOTTLES DRAWN AEROBIC ONLY Blood Culture adequate volume   Culture   Final    NO GROWTH 2 DAYS Performed at [facility], [place] [address], [place], [place] [address]    Report Status PENDING  Incomplete  C difficile quick scan w PCR reflex     Status: Abnormal   Collection Time: 03/05/18  9:27 AM  Result Value Ref Range Status   C Diff antigen POSITIVE (A) NEGATIVE Final   C Diff toxin NEGATIVE NEGATIVE Final   C Diff interpretation Results are indeterminate. See PCR results.  Final    Comment: Performed at [facility], [place] [address], [place], [place] [address]  C. Diff by PCR, Reflexed     Status: Abnormal   Collection Time: 03/05/18  9:27 AM  Result Value Ref Range Status   Toxigenic C. Difficile by PCR POSITIVE (A) NEGATIVE Final    Comment: Positive for toxigenic C. difficile with little to no toxin production. Only treat if clinical presentation suggests symptomatic illness. Performed at [facility], [place] [address] [place],  [address]     Coagulation Studies: No results for input(s): LABPROT, INR in the last 72 hours.  Urinalysis: No results for input(s): COLORURINE, LABSPEC, PHURINE, GLUCOSEU, HGBUR, BILIRUBINUR, KETONESUR, PROTEINUR, UROBILINOGEN, NITRITE, LEUKOCYTESUR in the last 72 hours.  Invalid input(s): APPERANCEUR     Imaging: Dg Chest Port 1 View  Result Date: 03/05/2018 CLINICAL DATA:  Tachypnea EXAM: PORTABLE CHEST 1 VIEW COMPARISON:  03/03/2018 FINDINGS: Cardiac shadow remains enlarged. Aortic calcifications are again noted. Endotracheal tube, gastric catheter and right jugular central line are again seen and stable. Mild vascular congestion is again seen and stable. No significant interstitial edema is noted. Some patchy atelectatic changes are seen in the right lung. No bony abnormality noted. IMPRESSION: Stable vascular congestion with mild atelectatic changes on the right. Electronically Signed  By: Inez Catalina M.D.   On: 03/05/2018 08:23     Medications:   . sodium chloride 10 mL/hr at 03/04/18 1137  . sodium chloride    . sodium chloride    . ceFEPime (MAXIPIME) IV 1 g (03/06/18 1328)  . feeding supplement (VITAL HIGH PROTEIN) 1,000 mL (03/07/18 0208)  . phenylephrine (NEO-SYNEPHRINE) Adult infusion Stopped (03/06/18 1820)   . aspirin  81 mg Per Tube Daily  . calcium carbonate (dosed in mg elemental calcium)  1,000 mg of elemental calcium Per Tube TID  . chlorhexidine gluconate (MEDLINE KIT)  15 mL Mouth Rinse BID  . Chlorhexidine Gluconate Cloth  6 each Topical Daily  . feeding supplement (PRO-STAT SUGAR FREE 64)  30 mL Per Tube Daily  . HYDROmorphone  1 mg Per Tube Q8H  . insulin aspart  0-15 Units Subcutaneous Q4H  . insulin glargine  15 Units Subcutaneous Daily  . levothyroxine  25 mcg Per Tube Q0600  . mouth rinse  15 mL Mouth Rinse 10 times per day  . pantoprazole sodium  40 mg Per Tube Q24H  . sodium chloride flush  10-40 mL Intracatheter Q12H  . vancomycin  125 mg Oral QID   sodium chloride, sodium chloride, acetaminophen (TYLENOL) oral liquid 160 mg/5 mL, alteplase, heparin, heparin, lidocaine (PF), lidocaine-prilocaine, pentafluoroprop-tetrafluoroeth, sodium chloride, sodium chloride flush  Assessment/ Plan:   Acute hypoxic respiratory failure secondary MRSA and  Klebsiella pneumonia.  Treated with linezolid and cefepime.  Intubated ventilated and sedated.  Right temporary hemodialysis catheter placed 02/25/2018.  Possible extubation today.   Acute diastolic heart failure ejection fraction 20 to 25% with inferolateral hypokinesis.  Has been evaluated by cardiology and not thought to be a cardiac catheterization candidate.  Acute renal failure on CKD IV; baseline creat 2.1- 2.6 (eGFR 13-20) in sept 2019. Has been on intermittent hemodialysis, sp acute HD on 1/27 and 1/29 so far.  We will plan next dialysis today in ICU.   Hypokalemia improved  Hypomagnesemia improved  Hypocalcemia repleted with IV calcium gluconate  Sepsis patient treated with antibiotics coverage for MRSA and Klebsiella pneumonia  Volume stable on exam, close to admit wt and last CXR clear 1/29   LOS: 11 Keith Cancio D Catheleen Langhorne @TODAY @7 :21 AM

## 2018-03-07 NOTE — Procedures (Signed)
Extubation Procedure Note  Patient Details:   Name: [name] [name] DOB: 31-Oct-1930 MRN: [id number]   Airway Documentation:    Vent end date: 03/07/18 Vent end time: 0945   Evaluation  O2 sats: stable throughout Complications: No apparent complications Patient did tolerate procedure well. Bilateral Breath Sounds: Clear, Diminished   Yes   Pt extubated to 3L [place] per MD order. Positive cuff leak noted prior to extubation. No stridor noted, bilateral diminished BS throughout. Pt able to speak and has a weak non-productive cough post extubation.   Laymond Purser M 03/07/2018, 10:00 AM

## 2018-03-07 NOTE — Progress Notes (Signed)
SLP Cancellation Note  Patient Details Name: [name] [name] MRN: [id number] DOB: 18-Mar-1930   Cancelled treatment:       Reason Eval/Treat Not Completed: Medical issues which prohibited therapy. Orders received for BSE. Pt currently intubated, with plans to be extubated today. SLP will follow for readiness to complete BSE.  [name] [name] [facility], CCC-SLP Speech Language Pathologist [phone]  [name] 03/07/2018, 9:18 AM

## 2018-03-07 NOTE — Care Management Note (Signed)
Case Management Note  Patient Details  Name: [name] [name] MRN: [id number] Date of Birth: [birth year]  Subjective/Objective:   Pt admitted with shock ,sepsis and HF                   Action/Plan:  PTA from home.     Expected Discharge Date:                  Expected Discharge Plan:     In-House Referral:  Clinical Social Work  Discharge planning Services  CM Consult  Post Acute Care Choice:    Choice offered to:     DME Arranged:    DME Agency:     HH Arranged:    HH Agency:     Status of Service:  In process, will continue to follow  If discussed at Long Length of Stay Meetings, dates discussed:    Additional Comments: 03/07/2018' Pt remains on ventilator - however plans are for extubation today.  Pt now on IHD for AKI via temp catheter.  CM will continue to follow for discharge planning  02/27/18 Pt remains on ventilator, CRRT and pressors.  Pt has a guarded prognosis and [place] discussions scheduled for Friday 02/28/18 Maryclare Labrador, RN 03/07/2018, 9:23 AM

## 2018-03-07 NOTE — Progress Notes (Deleted)
[place] for IV Heparin Indication: atrial fibrillation  Patient Measurements: Height: 5\' 5"  (165.1 cm) Weight: 187 lb 13.3 oz (85.2 kg) IBW/kg (Calculated) : 57 Heparin Dosing Weight: 79 kg  Vital Signs: Temp: 98.5 F (36.9 C) (01/31 0742) Temp Source: Oral (01/31 0742) BP: 99/47 (01/31 1100) Pulse Rate: 75 (01/31 1100)  Labs: Recent Labs    03/06/18 0100 03/06/18 0358  03/06/18 1204 03/06/18 1600 03/07/18 0431  HGB 10.9* 10.4*  --   --   --  10.1*  HCT 33.4* 32.6*  --   --   --  31.4*  PLT 85* 86*  --   --   --  PLATELET CLUMPS NOTED ON SMEAR, COUNT APPEARS DECREASED  CREATININE  --   --    < > 3.90* 4.23* 4.76*   < > = values in this interval not displayed.    Assessment: 85 year old female transitioned from oral Eliquis to IV Heparin. Night shift RN noted appearance of blood in stool 1/21 while cleaning up patient after supra-therapeutic aPTT > now resolved.  Heparin level and aPTT are therapeutic and correlating. Platelets stable. Heparin held due to vaginal bleeding. Per CCM, continue to hold heparin for tonight.   Goal of Therapy:  Heparin level 0.3-0.7 units/ml Monitor platelets by anticoagulation protocol: Yes   Plan:  Hold heparin gtt tonight  Restart heparin gtt tomorrow if no other reports of bleeding  Daily HL, CBC, and monitor s/sx of bleedig   Carlyon Shadow 03/07/2018 11:27 AM

## 2018-03-07 NOTE — Progress Notes (Signed)
Nutrition Follow-up  DOCUMENTATION CODES:   Not applicable  INTERVENTION:   When Cortrak placed, resume TF with:  Vital 1.5 at 50 ml/h (1200 ml per day)  Pro-stat 30 ml once daily  Provides 1900 kcal, 96 gm protein, 917 ml free water daily  NUTRITION DIAGNOSIS:   Inadequate oral intake related to inability to eat as evidenced by NPO status.  ongoing  GOAL:   Patient will meet greater than or equal to 90% of their needs  unmet  MONITOR:   Vent status, TF tolerance, Labs, Skin, I & O's  ASSESSMENT:   85 yo female with PMH of A fib, HTN, HLD, CHF, diverticulosis, CKD stage IV, and DM who was admitted with severe hyperkalemia and acute renal failure with bilateral pulmonary edema. Required intubation on the evening of 1/20.  Patient was extubated this morning. Plans to not re-intubate. Cortrak tube has been ordered. Currently receiving HD and placement of Cortrak tube may have to be delayed until Saturday.  Labs reviewed. Sodium 134 (L), phosphorus 4.8 (H), potassium 5 (WNL) CBG's: [phone] Medications reviewed and include calcium carbonate, novoLOG, Lantus.   Diet Order:   Diet Order            Diet NPO time specified  Diet effective now              EDUCATION NEEDS:   No education needs have been identified at this time  Skin:  Skin Assessment: Reviewed RN Assessment(MASD to breast)  Last BM:  1/31 type 7, rectal tube  Height:   Ht Readings from Last 1 Encounters:  03/03/2018 5\' 5"  (1.651 m)    Weight:   Wt Readings from Last 1 Encounters:  03/07/18 85.2 kg    Ideal Body Weight:  56.8 kg  BMI:  Body mass index is 31.26 kg/m.  Estimated Nutritional Needs:   Kcal:  1700-2000  Protein:  90-110 gm  Fluid:  UOP +1 L    Molli Barrows, RD, LDN, CNSC Pager [phone] After Hours Pager [phone]

## 2018-03-08 ENCOUNTER — Inpatient Hospital Stay (HOSPITAL_COMMUNITY): Payer: Medicare Other

## 2018-03-08 DIAGNOSIS — J150 Pneumonia due to Klebsiella pneumoniae: Secondary | ICD-10-CM

## 2018-03-08 DIAGNOSIS — J15212 Pneumonia due to Methicillin resistant Staphylococcus aureus: Secondary | ICD-10-CM

## 2018-03-08 LAB — BASIC METABOLIC PANEL
Anion gap: 14 (ref 5–15)
BUN: 48 mg/dL — ABNORMAL HIGH (ref 8–23)
CO2: 22 mmol/L (ref 22–32)
Calcium: 8.2 mg/dL — ABNORMAL LOW (ref 8.9–10.3)
Chloride: 102 mmol/L (ref 98–111)
Creatinine, Ser: 3.27 mg/dL — ABNORMAL HIGH (ref 0.44–1.00)
GFR calc Af Amer: 14 mL/min — ABNORMAL LOW (ref >60)
GFR calc non Af Amer: 12 mL/min — ABNORMAL LOW (ref >60)
Glucose, Bld: 150 mg/dL — ABNORMAL HIGH (ref 70–99)
Potassium: 4.4 mmol/L (ref 3.5–5.1)
Sodium: 138 mmol/L (ref 135–145)

## 2018-03-08 LAB — RENAL FUNCTION PANEL
Albumin: 1.9 g/dL — ABNORMAL LOW (ref 3.5–5.0)
Albumin: 1.6 g/dL — ABNORMAL LOW (ref 3.5–5.0)
Albumin: 1.7 g/dL — ABNORMAL LOW (ref 3.5–5.0)
Anion gap: 13 (ref 5–15)
Anion gap: 11 (ref 5–15)
Anion gap: 14 (ref 5–15)
BUN: 33 mg/dL — ABNORMAL HIGH (ref 8–23)
BUN: 45 mg/dL — ABNORMAL HIGH (ref 8–23)
BUN: 56 mg/dL — ABNORMAL HIGH (ref 8–23)
CO2: 25 mmol/L (ref 22–32)
CO2: 22 mmol/L (ref 22–32)
CO2: 20 mmol/L — ABNORMAL LOW (ref 22–32)
Calcium: 8.5 mg/dL — ABNORMAL LOW (ref 8.9–10.3)
Calcium: 7.6 mg/dL — ABNORMAL LOW (ref 8.9–10.3)
Calcium: 7.9 mg/dL — ABNORMAL LOW (ref 8.9–10.3)
Chloride: 102 mmol/L (ref 98–111)
Chloride: 105 mmol/L (ref 98–111)
Chloride: 104 mmol/L (ref 98–111)
Creatinine, Ser: 1.85 mg/dL — ABNORMAL HIGH (ref 0.44–1.00)
Creatinine, Ser: 2.92 mg/dL — ABNORMAL HIGH (ref 0.44–1.00)
Creatinine, Ser: 3.55 mg/dL — ABNORMAL HIGH (ref 0.44–1.00)
GFR calc Af Amer: 28 mL/min — ABNORMAL LOW (ref >60)
GFR calc Af Amer: 16 mL/min — ABNORMAL LOW (ref >60)
GFR calc Af Amer: 13 mL/min — ABNORMAL LOW (ref >60)
GFR calc non Af Amer: 24 mL/min — ABNORMAL LOW (ref >60)
GFR calc non Af Amer: 14 mL/min — ABNORMAL LOW (ref >60)
GFR calc non Af Amer: 11 mL/min — ABNORMAL LOW (ref >60)
Glucose, Bld: 103 mg/dL — ABNORMAL HIGH (ref 70–99)
Glucose, Bld: 133 mg/dL — ABNORMAL HIGH (ref 70–99)
Glucose, Bld: 197 mg/dL — ABNORMAL HIGH (ref 70–99)
Phosphorus: 2 mg/dL — ABNORMAL LOW (ref 2.5–4.6)
Phosphorus: 4 mg/dL (ref 2.5–4.6)
Phosphorus: 4.9 mg/dL — ABNORMAL HIGH (ref 2.5–4.6)
Potassium: 3.2 mmol/L — ABNORMAL LOW (ref 3.5–5.1)
Potassium: 5.8 mmol/L — ABNORMAL HIGH (ref 3.5–5.1)
Potassium: 4.5 mmol/L (ref 3.5–5.1)
Sodium: 140 mmol/L (ref 135–145)
Sodium: 138 mmol/L (ref 135–145)
Sodium: 138 mmol/L (ref 135–145)

## 2018-03-08 LAB — GLUCOSE, CAPILLARY
Glucose-Capillary: 118 mg/dL — ABNORMAL HIGH (ref 70–99)
Glucose-Capillary: 111 mg/dL — ABNORMAL HIGH (ref 70–99)
Glucose-Capillary: 131 mg/dL — ABNORMAL HIGH (ref 70–99)
Glucose-Capillary: 166 mg/dL — ABNORMAL HIGH (ref 70–99)
Glucose-Capillary: 220 mg/dL — ABNORMAL HIGH (ref 70–99)

## 2018-03-08 LAB — HEPARIN LEVEL (UNFRACTIONATED): Heparin Unfractionated: <0.1 IU/mL — ABNORMAL LOW (ref 0.30–0.70)

## 2018-03-08 LAB — MAGNESIUM: Magnesium: 2 mg/dL (ref 1.7–2.4)

## 2018-03-08 MED ORDER — HEPARIN (PORCINE) 25000 UT/250ML-% IV SOLN
1150.0000 [IU]/h | INTRAVENOUS | Status: DC
  Administered 2018-03-08: 950 [IU]/h via INTRAVENOUS
  Administered 2018-03-09: 1150 [IU]/h via INTRAVENOUS
  Filled 2018-03-08 (×2): qty 250

## 2018-03-08 MED ORDER — METOPROLOL TARTRATE 25 MG/10 ML ORAL SUSPENSION
6.2500 mg | Freq: Two times a day (BID) | ORAL | Status: DC
  Administered 2018-03-08 – 2018-03-09 (×3): 6.25 mg
  Filled 2018-03-08 (×3): qty 5

## 2018-03-08 MED ORDER — WHITE PETROLATUM EX OINT
TOPICAL_OINTMENT | CUTANEOUS | Status: AC
  Administered 2018-03-08: 01:00:00
  Filled 2018-03-08: qty 28.35

## 2018-03-08 MED ORDER — SODIUM CHLORIDE 0.9 % IV SOLN
1.0000 g | INTRAVENOUS | Status: DC
  Administered 2018-03-08: 1 g via INTRAVENOUS
  Filled 2018-03-08 (×2): qty 1

## 2018-03-08 MED ORDER — VANCOMYCIN HCL 10 G IV SOLR
1750.0000 mg | Freq: Once | INTRAVENOUS | Status: AC
  Administered 2018-03-08: 1750 mg via INTRAVENOUS
  Filled 2018-03-08: qty 1750

## 2018-03-08 MED ORDER — VANCOMYCIN HCL IN DEXTROSE 1-5 GM/200ML-% IV SOLN: 1000.0000 mg | INTRAVENOUS | Status: DC

## 2018-03-08 NOTE — Progress Notes (Signed)
[place] for IV Heparin Indication: atrial fibrillation  Patient Measurements: Height: 5\' 5"  (165.1 cm) Weight: 182 lb 5.1 oz (82.7 kg) IBW/kg (Calculated) : 57 Heparin Dosing Weight: 79 kg  Vital Signs: Temp: 97.2 F (36.2 C) (02/01 0714) Temp Source: Axillary (02/01 0714) BP: 112/55 (02/01 1000) Pulse Rate: 85 (02/01 1000)  Labs: Recent Labs    03/06/18 0100 03/06/18 0358  03/07/18 0431 03/07/18 1549 03/08/18 0522 03/08/18 0912  HGB 10.9* 10.4*  --  10.1*  --   --   --   HCT 33.4* 32.6*  --  31.4*  --   --   --   PLT 85* 86*  --  PLATELET CLUMPS NOTED ON SMEAR, COUNT APPEARS DECREASED  --   --   --   CREATININE  --   --    < > 4.76* 1.85* 2.92* 3.27*   < > = values in this interval not displayed.    Assessment: 85 year old female transitioned from oral Eliquis to IV Heparin. Night shift RN noted appearance of blood in stool 1/21 while cleaning up patient after supra-therapeutic aPTT > now resolved.  Resume heparin per MD today H&H stable  Goal of Therapy:  Heparin level 0.3-0.7 units/ml Monitor platelets by anticoagulation protocol: Yes   Plan:  Resume heparin 950 units/hr 1800 HL Daily HL CBC  Levester Fresh, PharmD, BCPS, BCCCP Clinical Pharmacist [phone]  Please check AMION for all [place] numbers  03/08/2018 10:43 AM

## 2018-03-08 NOTE — Evaluation (Signed)
Physical Therapy Evaluation Patient Details Name: [name] [name] MRN: [id number] DOB: 17-Jul-1930 Today's Date: 03/08/2018   History of Present Illness  Pt is a 85 y.o. F with significant PMH of CKD, DM2, a fib, CHF, who presents with SOB, cough, weakness, found to have acute on chronic CKD. Intubated 1/20-1/31.   Clinical Impression  Pt admitted with above diagnosis. Pt currently with functional limitations due to the deficits listed below (see PT Problem List). Patient presenting with significant debility/deconditioning, poor balance, decreased range of motion, impaired skin integrity, cognitive deficits, and diminished endurance. Requiring totalA + 2 for bed mobility. Will need to utilize maxi move lift for OOB mobility. Pt will benefit from skilled PT to increase their independence and safety with mobility to allow discharge to the venue listed below.       Follow Up Recommendations [facility]    Equipment Recommendations  None recommended by PT    Recommendations for Other Services       Precautions / Restrictions Precautions Precautions: Fall Precaution Comments: rectal tube, cortrak Restrictions Weight Bearing Restrictions: No      Mobility  Bed Mobility Overal bed mobility: Needs Assistance Bed Mobility: Supine to Sit;Sit to Supine     Supine to sit: Total assist;+2 for physical assistance Sit to supine: Total assist;+2 for physical assistance   General bed mobility comments: No initiation noted   Transfers                    Ambulation/Gait                Stairs            Wheelchair Mobility    Modified Rankin (Stroke Patients Only)       Balance Overall balance assessment: Needs assistance Sitting-balance support: Bilateral upper extremity supported;Feet supported Sitting balance-Leahy Scale: Poor Sitting balance - Comments: Pt able to perform sitting balance min guard for ~20 seconds then required total A                                      Pertinent Vitals/Pain Pain Assessment: Faces Faces Pain Scale: Hurts even more Pain Location: legs with movement Pain Descriptors / Indicators: Grimacing;Guarding Pain Intervention(s): Monitored during session    Home Living Family/patient expects to be discharged to:: Unsure                      Prior Function           Comments: Pt unable to provide PLOF     Hand Dominance   Dominant Hand: Right    Extremity/Trunk Assessment   Upper Extremity Assessment Upper Extremity Assessment: RUE deficits/detail;LUE deficits/detail RUE Deficits / Details: Weak grip strength, no active movement noted proximally LUE Deficits / Details: Weak grip strength, no active movement noted proximally     Lower Extremity Assessment Lower Extremity Assessment: RLE deficits/detail;LLE deficits/detail RLE Deficits / Details: Ankle dorsiflexion 2/5, otherwise pt unable to actively move RLE LLE Deficits / Details: No active movement noted       Communication   Communication: Receptive difficulties(recently extubated)  Cognition Arousal/Alertness: Awake/alert Behavior During Therapy: Flat affect Overall Cognitive Status: Impaired/Different from baseline Area of Impairment: Following commands;Awareness;Attention;Memory                   Current Attention Level: Focused Memory: Decreased short-term memory Following Commands: Follows one  step commands inconsistently   Awareness: Intellectual   General Comments: Pt able to follow simple commands intermittently. Pt mouthing words and difficult to make out. Was able to communicate needs, i.e. "scratch my back."      General Comments      Exercises     Assessment/Plan    PT Assessment Patient needs continued PT services  PT Problem List Decreased strength;Decreased range of motion;Decreased activity tolerance;Decreased balance;Decreased mobility;Decreased cognition;Pain       PT Treatment  Interventions Functional mobility training;Therapeutic activities;Therapeutic exercise;Balance training;Neuromuscular re-education;Patient/family education    PT Goals (Current goals can be found in the Care Plan section)  Acute Rehab PT Goals Patient Stated Goal: unable PT Goal Formulation: Patient unable to participate in goal setting Time For Goal Achievement: 03/22/18 Potential to Achieve Goals: Fair    Frequency Min 2X/week   Barriers to discharge        Co-evaluation               AM-PAC PT "6 Clicks" Mobility  Outcome Measure Help needed turning from your back to your side while in a flat bed without using bedrails?: Total Help needed moving from lying on your back to sitting on the side of a flat bed without using bedrails?: Total Help needed moving to and from a bed to a chair (including a wheelchair)?: Total Help needed standing up from a chair using your arms (e.g., wheelchair or bedside chair)?: Total Help needed to walk in hospital room?: Total Help needed climbing 3-5 steps with a railing? : Total 6 Click Score: 6    End of Session   Activity Tolerance: Patient limited by fatigue Patient left: in bed;with call bell/phone within reach;with bed alarm set Nurse Communication: Mobility status PT Visit Diagnosis: Other abnormalities of gait and mobility (R26.89);Muscle weakness (generalized) (M62.81)    Time: 4503-8882 PT Time Calculation (min) (ACUTE ONLY): 21 min   Charges:   PT Evaluation $PT Eval Moderate Complexity: 1 Mod         Ellamae Sia, [place], DPT Acute Rehabilitation Services Pager [phone] Office [phone]   Willy Eddy 03/08/2018, 4:51 PM

## 2018-03-08 NOTE — Procedures (Signed)
Cortrak  Person Inserting Tube:  Burtis Junes A, RD Tube Type:  Cortrak - 43 inches Tube Location:  Right nare Initial Placement:  Stomach Secured by: Bridle Technique Used to Measure Tube Placement:  Documented cm marking at nare/ corner of mouth Cortrak Secured At:  83 cm    Cortrak Tube Team Note: Consult received to place a Cortrak feeding tube.   Per Cortrak monitor, tube terminates in proximal duodenum.   No x-ray is required. RN may begin using tube. Will order the TF recommendations left by RD yesterday afternoon  If the tube becomes dislodged please keep the tube and contact the Cortrak team at [email or web address] ([email or web address]) for replacement.  If after hours and replacement cannot be delayed, place a NG tube and confirm placement with an abdominal x-ray.   Burtis Junes RD, LDN, CNSC Clinical Nutrition Available Tues-Sat via Pager: [phone] 03/08/2018 9:34 AM

## 2018-03-08 NOTE — Evaluation (Signed)
Clinical/Bedside Swallow Evaluation Patient Details  Name: [name] [name] MRN: [id number] Date of Birth: 1931-01-28  Today's Date: 03/08/2018 Time: SLP Start Time (ACUTE ONLY): 0758 SLP Stop Time (ACUTE ONLY): 0820 SLP Time Calculation (min) (ACUTE ONLY): 22 min  Past Medical History:  Past Medical History:  Diagnosis Date  . Acid reflux   . Atrial fibrillation ([place])   . Cardiomyopathy ([place])    a. Prior EF 45% in prior echos.  . Chronic anticoagulation   . Chronic combined systolic and diastolic CHF (congestive heart failure) ([place])   . CKD (chronic kidney disease), stage IV ([place])   . Diabetes mellitus type 2 in obese ([place])   . Diverticular hemorrhage 2011   Per colonoscopy  . Diverticulosis 2011   Per colonoscopy  . GI bleeding 08/03/2011   Not scoped but likely secondary to hemorrhoids or diverticulosis  . Gout   . Hx of medication noncompliance   . Hypercholesterolemia   . Hyperglycemia    Query DM 2  . Hypertension   . Internal hemorrhoids 2011   Per colonoscopy  . Lower extremity edema   . Seasonal allergies   . Thrombocytopenia due to drugs    Allopurinol   Past Surgical History:  Past Surgical History:  Procedure Laterality Date  . ADENOIDECTOMY    . APPENDECTOMY    . BUNIONECTOMY    . CATARACT EXTRACTION    . CESAREAN SECTION    . ESOPHAGOGASTRODUODENOSCOPY  08/20/09   small hiatal hernia/mild gastritis  . ileocolonoscopy  08/20/09   normal terminal ileum/pancolonic diverticula/no polyps/benign colon mucosa  . JOINT REPLACEMENT     Total right knee 2007  . LAPAROTOMY  09/22/2010   Procedure: EXPLORATORY LAPAROTOMY;  Surgeon: [name];  Location: [facility];  Service: General;  Laterality: N/A;  Lysis of Adhesions  . TONSILLECTOMY    . TUBAL LIGATION     HPI:  85 year old female admitted with SOB, cough, weakness, acute hypoxic respiratory failure secondary to MRSA and Klebsiella pneumonia. Started on Bipap but ultimately required respiratory  insufficiency. Intubated 1/20-1/31.    Assessment / Plan / Recommendation Clinical Impression  Patient presents with evidence of a suspected acute reversible dysphagia s/p prolonged (11 day) intubation. Patient severely aphonic  indicative of decreased glottal closure which despite only minimal overt s/s of aspiration with ice chips, significantly increases aspiration risk. Oral transit delayed, likely related to c/o oral pain, lingual lesions. Recommend NPO except 3-4 ice chips immediately after oral care.  SLP will follow up for diagnostic treatment. Patient will likely require instrumental testing prior to initiation of po diet given risk of silent aspiration with prolonged intubation.  SLP Visit Diagnosis: Dysphagia, oropharyngeal phase (R13.12)    Aspiration Risk  Severe aspiration risk    Diet Recommendation NPO;Ice chips PRN after oral care   Medication Administration: Via alternative means    Other  Recommendations Oral Care Recommendations: Oral care QID;Oral care prior to ice chip/H20   Follow up Recommendations Other (comment)(TBD)      Frequency and Duration min 3x week  2 weeks       Prognosis Prognosis for Safe Diet Advancement: Good Barriers to Reach Goals: Severity of deficits      Swallow Study   General HPI: 85 year old female admitted with SOB, cough, weakness, acute hypoxic respiratory failure secondary to MRSA and Klebsiella pneumonia. Started on Bipap but ultimately required respiratory insufficiency. Intubated 1/20-1/31.  Type of Study: Bedside Swallow Evaluation Previous Swallow Assessment: none  Diet Prior to this Study: NPO Temperature Spikes Noted: No Respiratory Status: Nasal cannula History of Recent Intubation: Yes Length of Intubations (days): 11 days Date extubated: 03/07/18 Behavior/Cognition: Alert;Cooperative;Pleasant mood Oral Cavity Assessment: Dried secretions;Erythema;Dry Oral Care Completed by SLP: Yes Oral Cavity - Dentition: Poor  condition;Missing dentition Vision: Functional for self-feeding Self-Feeding Abilities: Needs assist Patient Positioning: Upright in bed Baseline Vocal Quality: Aphonic Volitional Cough: Weak Volitional Swallow: Able to elicit    Oral/Motor/Sensory Function Overall Oral Motor/Sensory Function: Generalized oral weakness(decreased ROM due to dry oral cavity, oral pain)   Ice Chips Ice chips: Impaired Presentation: Spoon Pharyngeal Phase Impairments: Multiple swallows;Wet Vocal Quality(subtle)   Thin Liquid Thin Liquid: Not tested    Nectar Thick Nectar Thick Liquid: Not tested   Honey Thick Honey Thick Liquid: Not tested   Puree Puree: Not tested   Solid     Solid: Not tested     Kynzi Levay MA, CCC-SLP   Taylah Dubiel Meryl 03/08/2018,8:32 AM

## 2018-03-08 NOTE — Progress Notes (Signed)
ANTICOAGULATION CONSULT NOTE  Pharmacy Consult for IV Heparin Indication: atrial fibrillation  Patient Measurements: Height: 5\' 5"  (165.1 cm) Weight: 182 lb 5.1 oz (82.7 kg) IBW/kg (Calculated) : 57 Heparin Dosing Weight: 79 kg  Vital Signs: Temp: 98.1 F (36.7 C) (02/01 2015) Temp Source: Oral (02/01 2015) BP: 120/58 (02/01 2015) Pulse Rate: 100 (02/01 2015)  Labs: Recent Labs    03/06/18 0100 03/06/18 0358  03/07/18 0431  03/08/18 0522 03/08/18 0912 03/08/18 1601 03/08/18 1915  HGB 10.9* 10.4*  --  10.1*  --   --   --   --   --   HCT 33.4* 32.6*  --  31.4*  --   --   --   --   --   PLT 85* 86*  --  PLATELET CLUMPS NOTED ON SMEAR, COUNT APPEARS DECREASED  --   --   --   --   --   HEPARINUNFRC  --   --   --   --   --   --   --   --  <0.10*  CREATININE  --   --    < > 4.76*   < > 2.92* 3.27* 3.55*  --    < > = values in this interval not displayed.    Assessment: 85 year old female transitioned from oral Eliquis to IV Heparin. Night shift RN noted appearance of blood in stool 1/21 while cleaning up patient after supra-therapeutic aPTT > now resolved.  Heparin level came back undetectable, on 950 units/hr. No s/sx of bleeding outside of slight bleeding at site where level drawn. No infusion issues.  Goal of Therapy:  Heparin level 0.3-0.7 units/ml Monitor platelets by anticoagulation protocol: Yes   Plan:  Increase heparin infusion to 1150 units/hr Daily HL, CBC, and s/sx of bleeding   Antonietta Jewel, PharmD, [place] Clinical Pharmacist  Pager: [phone] Phone: [phone] Please check AMION for all [place] numbers 03/08/2018 8:29 PM

## 2018-03-08 NOTE — Progress Notes (Addendum)
NAME:  [name] [name], MRN:  [id number], DOB:  06-21-1930, LOS: 66 ADMISSION DATE:  02/05/2018, CONSULTATION DATE:  02/15/2018 REFERRING MD:  Martin Majestic - [facility] ED  CHIEF COMPLAINT:  SOB   Brief History   This is an 85 year old female who presented to [facility] with SOB, cough, weakness and found to have multiple metabolic derangements including AoCKD with Scr 6, BUN 445, K 7.2. Received temporizing measures and transferred to [facility] for further management.   Past Medical History  HTN, HLD, CHF, A.fib, CKD, DM, diverticulosis, GERD.  Significant Hospital Events   1/20 >Admitted to ICU and intubated 1/31 >Extubated  Consults:  Nephrology Cardiology  Procedures:  ETT 1/20 > 1/31 R IJ HD cath 1/20 >   Significant Diagnostic Tests:  CXR 1/20 > CM with pulmonary edema. Echo 1/20 >  Impressions:  - Compared to a prior study in 2019, the LVEF is further reduced to   20-25% with more severe RV hypokinesis, inferior and lateral LV   hypokinesis to akinesis and elevated biventricular filling   pressure.  CT head 03/01/18  IMPRESSION: No acute or traumatic finding. Chronic generalized atrophy.  Micro Data:  Blood 1/20 > NGTD Sputum 1/20 > Staphylococcus aureus and klebsiella pneumonia Urine 1/20 > <10K colonies, insignificant Resp Cx 1/28 Klebsiella Pneumoniae and MRSA  Antimicrobials:  Zosyn 1/22>1/25 Ancef >1/23>1/25 Fortaz > 1/26>28 Cefepime 1/26 >2/3 Linezolid 1/28 > 1/29 Vanc 2/1>2>3  Interim history/subjective:  Extubated yesterday. Remained on [place] overnight. Cortrak in place. Denies chest pain or shortness of breath. Requests ice chips.   Objective   Blood pressure (!) 123/47, pulse 77, temperature (!) 97.2 F (36.2 C), temperature source Axillary, resp. rate (!) 31, height 5\' 5"  (1.651 m), weight 82.7 kg, SpO2 95 %.        Intake/Output Summary (Last 24 hours) at 03/08/2018 1024 Last data filed at 03/08/2018 0800 Gross per 24 hour  Intake 341.31 ml  Output 2054 ml  Net -1712.69 ml     Filed Weights   03/07/18 1300 03/07/18 1711 03/08/18 0358  Weight: 85.2 kg 83.6 kg 82.7 kg    Physical Exam: General: Frail appearing female, no acute distress HENT: , AT, OP clear, MMM, Cortrak in place Eyes: EOMI, no scleral icterus Respiratory: Mild chest congestion. Clear to auscultation bilaterally.  No crackles, wheezing or rales Cardiovascular: RRR, -M/R/G, no JVD GI: BS+, soft, nontender Extremities:2+ pitting edema in UE bilaterally,-tenderness Neuro: Awake and alert, follows commands Skin: Intact, no rashes or bruising Psych: Normal mood, normal affect  Resolved Hospital Problem list   Septic and cardiogenic shock Acute hypoxemic respiratory failure Transaminitis secondary shock liver  Assessment & Plan:   Acute hypoxemic respiratory failure secondary to MRSA and Klebsiella pneumonia s/p abx treatment Recurrent MRSA and Klesiella pneumonia - Extubated yesterday - Complete total 5d of Vanc and Cefepime from last positive Resp CX date (1/28-2/3) for recurrent MRSA and Klebsiella pneumonia - Supplemental O2 to maintain sats >88-92%  C.diff Colitis - PO Vanc  Acute encephalopathy - improving - Minimize sedating meds  AoCKD progressing to ESRD - Dialysis per Nephrology.   Hyperkalemia Suspect hemolysis Repeat BMP normalized  Atrial fibrillation with RVR - Resume heparin gtt - Holding apixaban  - Restart low dose metoprolol  NSTEMI Chronic systolic heart failure Worsening EF 20-25% on this admission - ASA 91 mg, BB.  - Cardiology previously consulted. Not candidate for cardiac cath previously. May consider further evaluation after improvement.  DM - Lantus 15U - SSI  Hypothyroidism: - Continue synthroid 25 mcg daily  HTN, HLD, HFrEF -Holding lasix,and pravastatin    Gout: -On allopurinol at home, currently holding this  Transfer to tele step-down. [facility] accepting service (Dr. Thereasa Solo).  Best practice:  Diet: Tube  feeds Pain/Anxiety/Delirium protocol (if indicated): Precedex, RASS goal 0 to -1. VAP protocol (if indicated): Yes DVT prophylaxis: SCDs holding heparin GI prophylaxis: PPI Glucose control: SSI Mobility: Bedrest Code Status: DNR/DNI Family Communication: Family agrees for DNR/DNI Disposition: Remain in ICU  Labs   CBC: Recent Labs  Lab 03/05/18 0901 03/05/18 1707 03/06/18 0100 03/06/18 0358 03/07/18 0431  WBC 20.6* 22.5* 22.7* 23.7* 19.5*  HGB 11.9* 12.6 10.9* 10.4* 10.1*  HCT 36.4 37.4 33.4* 32.6* 31.4*  MCV 89.4 86.0 88.8 90.1 89.0  PLT 87* 92* 85* 86* PLATELET CLUMPS NOTED ON SMEAR, COUNT APPEARS DECREASED    Basic Metabolic Panel: Recent Labs  Lab 03/05/18 0348  03/06/18 0618 03/06/18 1204 03/06/18 1600 03/06/18 1630 03/07/18 0431 03/07/18 1549 03/07/18 1713 03/08/18 0522  NA 131*   < > 139 134* 134*  --  134* 140  --  138  K 4.3   < > 2.4* 4.4 4.9  --  5.0 3.2* 3.7 5.8*  CL 93*   < > 113* 103 101  --  98 102  --  105  CO2 16*   < > 15* 17* 17*  --  17* 25  --  22  GLUCOSE 258*   < > 182* 251* 286*  --  218* 103*  --  133*  BUN 145*   < > 58* 80* 90*  --  113* 33*  --  45*  CREATININE 5.62*   < > 2.75* 3.90* 4.23*  --  4.76* 1.85*  --  2.92*  CALCIUM 7.2*   < > 4.9* 7.7* 7.8*  --  8.1* 8.5*  --  7.6*  MG 1.8  --  1.2*  --   --  2.3 2.3  --   --  2.0  PHOS 7.8*   < > 3.0  --  4.1  --  4.8* 2.0*  --  4.0   < > = values in this interval not displayed.   GFR: Estimated Creatinine Clearance: 15 mL/min (A) (by C-G formula based on SCr of 2.92 mg/dL (H)). Recent Labs  Lab 03/05/18 1707 03/06/18 0100 03/06/18 0358 03/07/18 0431  WBC 22.5* 22.7* 23.7* 19.5*    Liver Function Tests: Recent Labs  Lab 03/02/18 0408  03/06/18 0618 03/06/18 1600 03/07/18 0431 03/07/18 1549 03/08/18 0522  AST 169*  --   --   --   --   --   --   ALT 23  --   --   --   --   --   --   ALKPHOS 109  --   --   --   --   --   --   BILITOT 2.3*  --   --   --   --   --   --    PROT 7.0  --   --   --   --   --   --   ALBUMIN 2.7*  2.7*   < > 1.1* 1.7* 1.7* 1.9* 1.6*   < > = values in this interval not displayed.   Recent Labs  Lab 03/04/18 1507  LIPASE 181*   No results for input(s): AMMONIA in the last 168 hours.  ABG    Component Value Date/Time  PHART 7.492 (H) 03/03/2018 1747   PCO2ART 26.6 (L) 03/03/2018 1747   PO2ART 166.0 (H) 03/03/2018 1747   HCO3 20.2 03/03/2018 1747   TCO2 21 (L) 03/03/2018 1747   ACIDBASEDEF 1.0 03/03/2018 1747   O2SAT 100.0 03/03/2018 1747     Coagulation Profile: No results for input(s): INR, PROTIME in the last 168 hours.  Cardiac Enzymes: No results for input(s): CKTOTAL, CKMB, CKMBINDEX, TROPONINI in the last 168 hours.  HbA1C: Hgb A1c MFr Bld  Date/Time Value Ref Range Status  10/23/2017 03:06 PM 7.9 (H) 4.8 - 5.6 % Final    Comment:    (NOTE) Pre diabetes:          5.7%-6.4% Diabetes:              >6.4% Glycemic control for   <7.0% adults with diabetes   09/17/2010 05:27 AM 6.3 (H) <5.7 % Final    Comment:    (NOTE)                                                                       According to the ADA Clinical Practice Recommendations for 2011, when HbA1c is used as a screening test:  >=6.5%   Diagnostic of Diabetes Mellitus           (if abnormal result is confirmed) 5.7-6.4%   Increased risk of developing Diabetes Mellitus References:Diagnosis and Classification of Diabetes Mellitus,Diabetes UEAV,4098,11(BJYNW 1):S62-S69 and Standards of Medical Care in         Diabetes - 2011,Diabetes GNFA,2130,86 (Suppl 1):S11-S61.    CBG: Recent Labs  Lab 03/07/18 1530 03/07/18 1913 03/07/18 2323 03/08/18 0347 03/08/18 0712  GLUCAP 102* 104* 141* 118* 111*    The patient is critically ill with multiple organ systems failure and requires high complexity decision making for assessment and support, frequent evaluation and titration of therapies, application of advanced monitoring technologies and  extensive interpretation of multiple databases.   Critical Care Time devoted to patient care services described in this note is 31 Minutes. This time reflects time of care of this signee Dr. Rodman Pickle. This critical care time does not reflect procedure time, or teaching time or supervisory time of PA/NP/Med student/Med Resident etc but could involve care discussion time.

## 2018-03-08 NOTE — Progress Notes (Addendum)
Patient ID: [name] [name], female   DOB: 11-04-1930, 85 y.o.   MRN: [id number]  [facility] Progress Note   Assessment/ Plan:   1. Acute kidney Injury on chronic kidney disease stage IV-V: She has likely progressed to end-stage renal disease and will require chronic hemodialysis.  She currently has a right IJ temporary catheter which will need to be converted to a TDC.  I will obtain vein mapping today.  Consult vascular surgery next week for dialysis access planning. Unless acutely needed, continue MWF HD. 2.  Hyperkalemia: Suspect labs affected by hemolysis of specimen, will recheck stat metabolic panel to decide on need for treatment of hyperkalemia with Lokelma. 3.  Acute hypoxic respiratory failure: Extubated and remains on supplemental oxygen overnight. 4.  C. difficile colitis: On oral vancomycin and plans noted for core tract tube placement today to help nutritional status. 5.  MRSA and Klebsiella pneumonia: She has completed intravenous antibiotic therapy with initial vancomycin and Fortaz narrowed to cefepime.  Subjective:   She was extubated overnight and has done fairly.  Underwent hemodialysis yesterday.   Objective:   BP (!) 122/57   Pulse 92   Temp (!) 97.2 F (36.2 C) (Axillary)   Resp (!) 32   Ht 5\' 5"  (1.651 m)   Wt 82.7 kg   SpO2 95%   BMI 30.34 kg/m   Intake/Output Summary (Last 24 hours) at 03/08/2018 0730 Last data filed at 03/08/2018 0700 Gross per 24 hour  Intake 431.31 ml  Output 2054 ml  Net -1622.69 ml   Weight change: 0 kg  Physical Exam: Gen: Resting comfortably in bed, mouths words to respond to questions CVS: Regular rhythm, normal rate, S1 and S2 normal Resp: Coarse breath sounds bilaterally, no distinct rales or rhonchi Abd: Soft, obese, nontender Ext: No lower extremity edema, trace-1+ dependent edema and trace upper extremity edema  Imaging: No results found.  Labs: BMET Recent Labs  Lab 03/05/18 1341 03/05/18 1600  03/06/18 0618 03/06/18 1204 03/06/18 1600 03/07/18 0431 03/07/18 1549 03/07/18 1713 03/08/18 0522  NA 130* 134* 139 134* 134* 134* 140  --  138  K 4.3 3.3* 2.4* 4.4 4.9 5.0 3.2* 3.7 5.8*  CL 93* 96* 113* 103 101 98 102  --  105  CO2 15* 21* 15* 17* 17* 17* 25  --  22  GLUCOSE 316* 128* 182* 251* 286* 218* 103*  --  133*  BUN 159* 48* 58* 80* 90* 113* 33*  --  45*  CREATININE 5.94* 2.37* 2.75* 3.90* 4.23* 4.76* 1.85*  --  2.92*  CALCIUM 7.3* 7.4* 4.9* 7.7* 7.8* 8.1* 8.5*  --  7.6*  PHOS 8.3* 2.7 3.0  --  4.1 4.8* 2.0*  --  4.0   CBC Recent Labs  Lab 03/05/18 1707 03/06/18 0100 03/06/18 0358 03/07/18 0431  WBC 22.5* 22.7* 23.7* 19.5*  HGB 12.6 10.9* 10.4* 10.1*  HCT 37.4 33.4* 32.6* 31.4*  MCV 86.0 88.8 90.1 89.0  PLT 92* 85* 86* PLATELET CLUMPS NOTED ON SMEAR, COUNT APPEARS DECREASED    Medications:    . aspirin  81 mg Per Tube Daily  . calcium carbonate (dosed in mg elemental calcium)  1,000 mg of elemental calcium Per Tube TID  . chlorhexidine  15 mL Mouth Rinse BID  . Chlorhexidine Gluconate Cloth  6 each Topical Daily  . feeding supplement (PRO-STAT SUGAR FREE 64)  30 mL Per Tube Daily  . HYDROmorphone  1 mg Per Tube Q8H  . insulin  aspart  0-15 Units Subcutaneous Q4H  . insulin glargine  15 Units Subcutaneous Daily  . levothyroxine  25 mcg Per Tube Q0600  . mouth rinse  15 mL Mouth Rinse q12n4p  . sodium chloride flush  10-40 mL Intracatheter Q12H  . vancomycin  125 mg Oral QID   Elmarie Shiley, MD 03/08/2018, 7:30 AM

## 2018-03-08 DEATH — deceased

## 2018-03-09 ENCOUNTER — Inpatient Hospital Stay (HOSPITAL_COMMUNITY): Payer: Medicare Other

## 2018-03-09 DIAGNOSIS — I214 Non-ST elevation (NSTEMI) myocardial infarction: Secondary | ICD-10-CM

## 2018-03-09 DIAGNOSIS — E162 Hypoglycemia, unspecified: Secondary | ICD-10-CM

## 2018-03-09 DIAGNOSIS — J15212 Pneumonia due to Methicillin resistant Staphylococcus aureus: Secondary | ICD-10-CM

## 2018-03-09 DIAGNOSIS — A0472 Enterocolitis due to Clostridium difficile, not specified as recurrent: Secondary | ICD-10-CM

## 2018-03-09 DIAGNOSIS — I4891 Unspecified atrial fibrillation: Secondary | ICD-10-CM

## 2018-03-09 DIAGNOSIS — K7200 Acute and subacute hepatic failure without coma: Secondary | ICD-10-CM

## 2018-03-09 DIAGNOSIS — G92 Toxic encephalopathy: Secondary | ICD-10-CM

## 2018-03-09 DIAGNOSIS — J150 Pneumonia due to Klebsiella pneumoniae: Secondary | ICD-10-CM

## 2018-03-09 DIAGNOSIS — R0682 Tachypnea, not elsewhere classified: Secondary | ICD-10-CM

## 2018-03-09 DIAGNOSIS — G929 Unspecified toxic encephalopathy: Secondary | ICD-10-CM

## 2018-03-09 LAB — CULTURE, BLOOD (ROUTINE X 2)
Culture: NO GROWTH
Culture: NO GROWTH
Special Requests: ADEQUATE
Special Requests: ADEQUATE

## 2018-03-09 LAB — RENAL FUNCTION PANEL
Albumin: 1.8 g/dL — ABNORMAL LOW (ref 3.5–5.0)
Anion gap: 18 — ABNORMAL HIGH (ref 5–15)
BUN: 67 mg/dL — ABNORMAL HIGH (ref 8–23)
CO2: 20 mmol/L — ABNORMAL LOW (ref 22–32)
Calcium: 8 mg/dL — ABNORMAL LOW (ref 8.9–10.3)
Chloride: 102 mmol/L (ref 98–111)
Creatinine, Ser: 4.24 mg/dL — ABNORMAL HIGH (ref 0.44–1.00)
GFR calc Af Amer: 10 mL/min — ABNORMAL LOW (ref >60)
GFR calc non Af Amer: 9 mL/min — ABNORMAL LOW (ref >60)
Glucose, Bld: 250 mg/dL — ABNORMAL HIGH (ref 70–99)
Phosphorus: 4.8 mg/dL — ABNORMAL HIGH (ref 2.5–4.6)
Potassium: 4.9 mmol/L (ref 3.5–5.1)
Sodium: 140 mmol/L (ref 135–145)

## 2018-03-09 LAB — GLUCOSE, CAPILLARY
Glucose-Capillary: 238 mg/dL — ABNORMAL HIGH (ref 70–99)
Glucose-Capillary: 273 mg/dL — ABNORMAL HIGH (ref 70–99)

## 2018-03-09 LAB — CBC
HCT: 33 % — ABNORMAL LOW (ref 36.0–46.0)
Hemoglobin: 10.3 g/dL — ABNORMAL LOW (ref 12.0–15.0)
MCH: 28.5 pg (ref 26.0–34.0)
MCHC: 31.2 g/dL (ref 30.0–36.0)
MCV: 91.4 fL (ref 80.0–100.0)
Platelets: 161 10*3/uL (ref 150–400)
RBC: 3.61 MIL/uL — ABNORMAL LOW (ref 3.87–5.11)
RDW: 19.6 % — ABNORMAL HIGH (ref 11.5–15.5)
WBC: 9.7 10*3/uL (ref 4.0–10.5)
nRBC: 0.2 % (ref 0.0–0.2)

## 2018-03-09 LAB — HEPARIN LEVEL (UNFRACTIONATED): Heparin Unfractionated: 0.19 IU/mL — ABNORMAL LOW (ref 0.30–0.70)

## 2018-03-09 LAB — MAGNESIUM: Magnesium: 2.2 mg/dL (ref 1.7–2.4)

## 2018-03-09 MED ORDER — MORPHINE SULFATE (PF) 2 MG/ML IV SOLN
INTRAVENOUS | Status: AC
  Filled 2018-03-09: qty 1

## 2018-03-09 MED ORDER — INSULIN ASPART 100 UNIT/ML ~~LOC~~ SOLN: 0.0000 [IU] | Freq: Every day | SUBCUTANEOUS | Status: DC

## 2018-03-09 MED ORDER — VANCOMYCIN HCL IN DEXTROSE 1-5 GM/200ML-% IV SOLN: 1000.0000 mg | INTRAVENOUS | Status: DC

## 2018-03-09 MED ORDER — MORPHINE 100MG IN NS 100ML (1MG/ML) PREMIX INFUSION
1.0000 mg/h | INTRAVENOUS | Status: DC
  Administered 2018-03-09: 1 mg/h via INTRAVENOUS
  Filled 2018-03-09: qty 100

## 2018-03-09 MED ORDER — LORAZEPAM 2 MG/ML IJ SOLN
0.5000 mg | Freq: Once | INTRAMUSCULAR | Status: AC
  Administered 2018-03-09: 0.5 mg via INTRAVENOUS
  Filled 2018-03-09: qty 1

## 2018-03-09 MED ORDER — INSULIN ASPART 100 UNIT/ML ~~LOC~~ SOLN: 0.0000 [IU] | Freq: Three times a day (TID) | SUBCUTANEOUS | Status: DC

## 2018-03-09 MED ORDER — MORPHINE SULFATE (PF) 2 MG/ML IV SOLN
2.0000 mg | INTRAVENOUS | Status: DC | PRN
  Administered 2018-03-09: 2 mg via INTRAVENOUS

## 2018-03-09 MED ORDER — INSULIN ASPART 100 UNIT/ML ~~LOC~~ SOLN: 3.0000 [IU] | Freq: Three times a day (TID) | SUBCUTANEOUS | Status: DC

## 2018-03-09 MED ORDER — MORPHINE BOLUS VIA INFUSION
4.0000 mg | Freq: Once | INTRAVENOUS | Status: AC
  Administered 2018-03-09: 4 mg via INTRAVENOUS
  Filled 2018-03-09: qty 4

## 2018-03-09 MED ORDER — SODIUM CHLORIDE 0.9 % IV SOLN
1.0000 g | INTRAVENOUS | Status: DC
  Filled 2018-03-09: qty 1

## 2018-03-09 NOTE — Significant Event (Signed)
Rapid Response Event Note  Overview: Time Called: 4469 Arrival Time: 5072 Event Type: Respiratory  Initial Focused Assessment: Patient transferred from ICU yesterday. (PNA/Intubated x11 days)  Per RN patient was alert and able to speak yesterday.  This morning she is has increased WOB and is not able to speak or cough.  Per RN she had an episode with decreased O2 sats and placed on NRB BP 120/81  HR 107  RR 50  O2 sat 100% on NRB Lung sounds crackles in posterior bases.  Interventions: Placed in [place] fow[place] Oral care done RT at bedside  Placed patient on Bipap No improvement while on Bipap ABG done Plan tx to [facility] bed  1045  MD had lengthy conversation with family.   Plan transition to [place]. Bipap D/C'd per orders Morphine gtt started    Plan of Care (if not transferred):  Event Summary: Name of Physician Notified: Dr Aileen Fass at bedside at      at    Outcome: Code status clarified  Event End Time: [address]

## 2018-03-09 NOTE — Progress Notes (Signed)
Patient ID: [name] [name], female   DOB: 05-Jul-1930, 85 y.o.   MRN: [id number] S: pt in respiratory distress, nonverbal, and febrile.  Rapid response called for BiPap due to hypoxia and respiratory distress as pt is a DNR O:BP 120/81 (BP Location: Left Arm)   Pulse 66   Temp (!) 102.2 F (39 C) (Oral)   Resp (!) 48   Ht 5\' 5"  (1.651 m)   Wt 83 kg   SpO2 100%   BMI 30.45 kg/m   Intake/Output Summary (Last 24 hours) at 03/09/2018 1023 Last data filed at 03/09/2018 0600 Gross per 24 hour  Intake 1016.98 ml  Output 400 ml  Net 616.98 ml   Intake/Output: I/O last 3 completed shifts: In: 1248.3 [I.V.:350.6; NG/GT:198.3; IV Piggyback:699.3] Out: 650 [Stool:650]  Intake/Output this shift:  No intake/output data recorded. Weight change: -2.2 kg Gen: elderly AAF who is in moderate respiratory distress TIR:WERXV no rub Resp: cta Abd:+BS, soft, NT/ND Ext: minimal gravity dependent edema  Recent Labs  Lab 03/06/18 0618  03/06/18 1600 03/07/18 0431 03/07/18 1549 03/07/18 1713 03/08/18 0522 03/08/18 0912 03/08/18 1601 03/09/18 0345  NA 139   < > 134* 134* 140  --  138 138 138 140  K 2.4*   < > 4.9 5.0 3.2* 3.7 5.8* 4.4 4.5 4.9  CL 113*   < > 101 98 102  --  105 102 104 102  CO2 15*   < > 17* 17* 25  --  22 22 20* 20*  GLUCOSE 182*   < > 286* 218* 103*  --  133* 150* 197* 250*  BUN 58*   < > 90* 113* 33*  --  45* 48* 56* 67*  CREATININE 2.75*   < > 4.23* 4.76* 1.85*  --  2.92* 3.27* 3.55* 4.24*  ALBUMIN 1.1*  --  1.7* 1.7* 1.9*  --  1.6*  --  1.7* 1.8*  CALCIUM 4.9*   < > 7.8* 8.1* 8.5*  --  7.6* 8.2* 7.9* 8.0*  PHOS 3.0  --  4.1 4.8* 2.0*  --  4.0  --  4.9* 4.8*   < > = values in this interval not displayed.   Liver Function Tests: Recent Labs  Lab 03/08/18 0522 03/08/18 1601 03/09/18 0345  ALBUMIN 1.6* 1.7* 1.8*   Recent Labs  Lab 03/04/18 1507  LIPASE 181*   No results for input(s): AMMONIA in the last 168 hours. CBC: Recent Labs  Lab 03/05/18 1707  03/06/18 0100 03/06/18 0358 03/07/18 0431 03/09/18 0345  WBC 22.5* 22.7* 23.7* 19.5* 9.7  HGB 12.6 10.9* 10.4* 10.1* 10.3*  HCT 37.4 33.4* 32.6* 31.4* 33.0*  MCV 86.0 88.8 90.1 89.0 91.4  PLT 92* 85* 86* PLATELET CLUMPS NOTED ON SMEAR, COUNT APPEARS DECREASED 161   Cardiac Enzymes: No results for input(s): CKTOTAL, CKMB, CKMBINDEX, TROPONINI in the last 168 hours. CBG: Recent Labs  Lab 03/08/18 1111 03/08/18 1635 03/08/18 2018 03/09/18 0035 03/09/18 0721  GLUCAP 131* 166* 220* 238* 273*    Iron Studies: No results for input(s): IRON, TIBC, TRANSFERRIN, FERRITIN in the last 72 hours. Studies/Results: No results found. Marland Kitchen aspirin  81 mg Per Tube Daily  . calcium carbonate (dosed in mg elemental calcium)  1,000 mg of elemental calcium Per Tube TID  . chlorhexidine  15 mL Mouth Rinse BID  . Chlorhexidine Gluconate Cloth  6 each Topical Daily  . feeding supplement (PRO-STAT SUGAR FREE 64)  30 mL Per Tube Daily  . insulin aspart  0-5 Units Subcutaneous QHS  . insulin aspart  0-9 Units Subcutaneous TID WC  . insulin aspart  3 Units Subcutaneous TID WC  . insulin glargine  15 Units Subcutaneous Daily  . levothyroxine  25 mcg Per Tube Q0600  . mouth rinse  15 mL Mouth Rinse q12n4p  . metoprolol tartrate  6.25 mg Per Tube BID  . morphine      . sodium chloride flush  10-40 mL Intracatheter Q12H  . vancomycin  125 mg Oral QID    BMET    Component Value Date/Time   NA 140 03/09/2018 0345   NA 142 10/12/2011 1137   K 4.9 03/09/2018 0345   K 3.8 10/12/2011 1137   CL 102 03/09/2018 0345   CL 109 (H) 10/12/2011 1137   CO2 20 (L) 03/09/2018 0345   CO2 24 10/12/2011 1137   GLUCOSE 250 (H) 03/09/2018 0345   GLUCOSE 107 (H) 10/12/2011 1137   BUN 67 (H) 03/09/2018 0345   BUN 64 (H) 10/12/2011 1137   CREATININE 4.24 (H) 03/09/2018 0345   CREATININE 2.43 (H) 10/12/2011 1137   CALCIUM 8.0 (L) 03/09/2018 0345   CALCIUM 6.7 (LL) 04/10/2014 0442   GFRNONAA 9 (L) 03/09/2018 0345    GFRNONAA 18 (L) 10/12/2011 1137   GFRAA 10 (L) 03/09/2018 0345   GFRAA 21 (L) 10/12/2011 1137   CBC    Component Value Date/Time   WBC 9.7 03/09/2018 0345   RBC 3.61 (L) 03/09/2018 0345   HGB 10.3 (L) 03/09/2018 0345   HGB 11.2 (L) 10/02/2011 1650   HCT 33.0 (L) 03/09/2018 0345   PLT 161 03/09/2018 0345   MCV 91.4 03/09/2018 0345   MCH 28.5 03/09/2018 0345   MCHC 31.2 03/09/2018 0345   RDW 19.6 (H) 03/09/2018 0345   LYMPHSABS 1.6 02/28/2018 1507   MONOABS 2.1 (H) 03/03/2018 1507   EOSABS 0.0 02/07/2018 1507   BASOSABS 0.1 02/05/2018 1507     Assessment/Plan:  1. Respiratory distress- pt just extubated on 03/07/18 due to pna now with worsening respiratory status.  She is a DNR and is on Bipap.  Dr. Olevia Bowens to discuss goals of care with family 2. AKI/CKD stage 4- in setting of sepsis and concomitant ace-inhibition, started on CVVHD on 02/17/2018 and transitioned to IHD on 03/02/18 and had HD 03/05/18 and 03/08/18.  Respiratory status does not appear to be volume related and will hold off on another session of HD and hold off on further HD pending outcome of family meeting 3. C Diff colitis- on po vanco 4. MRSA and Klebsiella sepsis- completed IV abx but remains febrile.  Per primary svc and family meeting. 5. A fib with RVR- rate controlled 6. NSTEMI- not a candidate for aggressive cardiac intervention 7. DM type 2- per primary 8. Disposition- poor overall prognosis and await family meeting outcome  Donetta Potts, MD [employer] [phone]

## 2018-03-09 NOTE — Care Management (Signed)
For assistance with residential hospice placement, please contact CSW. Coverage for Sunday can be reached at [phone]. This information sent to Dr Dr Aileen Fass via secure chat.

## 2018-03-09 NOTE — Progress Notes (Signed)
This am patient was struggling to breathe, and couldn't not talk to tell me where she was. Patient had a temp this am at 0500 of 102.2. During report I was unaware of the temperature, and was given this information during report. MD is now aware of findings, and has requested the patient to go to 4E progressive at this time. Tylenol was given per tube, and will continue to monitor patient.   Farley Ly RN

## 2018-03-09 NOTE — Progress Notes (Signed)
Called to a rapid response to place pt on Bipap. Pt placed on Bipap Pt is still in distress. Sp02 is 100%, HR is 66. X-ray is currently being done. ABG will be done as soon as X-ray is done.

## 2018-03-09 NOTE — Progress Notes (Signed)
Pt taken off Bipap per MD. Pt placed on NRB for comfort measures.Pt is comfortable at this time.  Family at bedside.

## 2018-03-09 NOTE — Progress Notes (Signed)
Patient's [place] removed per order. Pressure held and vaseline gauze dressing applied. Site clean, dry, and intact. Patient on bedrest until 1320. RN and family aware to keep dressing on and dry for 24 hours.

## 2018-03-09 NOTE — Progress Notes (Signed)
SLP Cancellation Note  Patient Details Name: [name] [name] MRN: [id number] DOB: 12/09/1930   Cancelled treatment:         Per MD, patient is now for comfort measures only.  Given this ST will sign off.  If we can be of further assistance please feel free to re-consult.  Thank you for the consult.   Shelly Flatten, MA, CCC-SLP Acute Rehab SLP [phone]  Lamar Sprinkles 03/09/2018, 10:39 AM

## 2018-03-09 NOTE — Progress Notes (Signed)
[facility] PROGRESS NOTE    Progress Note  [name] [name]  VOJ:[id number] DOB: 1930-08-05 DOA: 02/21/2018 PCP: Vidal Schwalbe, MD     Brief Narrative:   [name] [name] is an 85 y.o. female past medical history chronic kidney disease stage IV, atrial fibrillation Eliquis, pulmonary hypertension, diabetes mellitus type 2 presented to any pain with shortness of breath cough and wheezing  Hospital events: 02/23/2018 admitted to the ICU and extubated 03/07/2018 extubated  Procedures: 02/23/2018 right IJ HD catheter 1/20 2020 chest x-ray that showed pulmonary edema 03/04/2018 echocardiogram that showed a reduced EF to 20 to 25% with right RV hypokinesis 03/01/2018 CT of the head that showed no acute findings  Microbiology: 02/15/2018 blood cultures that remain negative till date. 03/04/2018 tracheal aspirate that showed staph aureus and Klebsiella pneumonia 02/12/2018 urine culture showed less than 10,000 colonies    Zosyn 1/22>1/25 Ancef >1/23>1/25 Fortaz > 1/26>28 Cefepime 1/26 >2/3 Linezolid 1/28 > 1/29 Vanc 2/1>2>3  Assessment/Plan:    Acute respiratory failure with hypoxia (HCC) due to MRSA and Klebsiella pneumonia: She was intubated on admission respiratory cultures on 02/27/2018 show MRSA and Klebsiella.  She completed her antibiotic regimen.  She was extubated on 03/08/2018 and transferred to [facility] on 03/09/2018. Patient is breathing about 30 times per minute, she has now a new fever, CBC does not show leukocytosis.  I cannot evaluate her fluid status. We will put her on a nonrebreather, check a chest x-ray, blood cultures and an ABG. We will start empirically on IV vancomycin and cefepime. We will transfer to stepdown place her on BiPAP. I have talked to the family over the phone they are heading over to have a discussion about goals of care as she does not seem to be improving.  C. difficile colitis: Having diarrhea on 03/05/2018 C. difficile PCR was  positive. She was started on oral Vanc on 03/05/2018  Toxic encephalopathy: Likely due to infectious etiology, could she continues to improve slowly.  Acute kidney injury on chronic kidney disease 4-5. Nephrology was consulted, she is likely progressed to end-stage renal disease. Temporary J HD catheter in place will need to be converted to a tunneled catheter. Vein mapping was done on 03/08/2018, vascular surgery was consulted for dialysis access next week. He is scheduled for hemo-dialysis Monday Wednesday and Friday.  Hyperkalemia: 4.9 we will continue to monitor daily.  A. fib with RVR: Rate controlled metoprolol. Continue IV heparin as she will need vascular surgery and tunneled catheter.  Non-ST elevation MI/chronic systolic heart failure: Continue aspirin and beta-blockers. Cardiology was consulted. They deemed her not a candidate for aggressive cardiac intervention. Holding statins.  Diabetes mellitus type 2: Blood sugars running high, continue Lantus plus sliding scale insulin.  Continue CBGs before meals and at bedtime  Hypothyroidism: Cont synthroid.  Essential Hypertension: Cont metoprolol.  Gout: Continue allopurinol.  DVT prophylaxis: heparin Family Communication:  none Disposition Plan/Barrier to D/C: transfer to SDU Code Status:     Code Status Orders  (From admission, onward)         Start     Ordered   02/25/18 1352  Do not attempt resuscitation (DNR)  Continuous    Question Answer Comment  In the event of cardiac or respiratory ARREST Do not call a "code blue"   In the event of cardiac or respiratory ARREST Do not perform Intubation, CPR, defibrillation or ACLS   In the event of cardiac or respiratory ARREST Use medication by any route, position, wound  care, and other measures to relive pain and suffering. May use oxygen, suction and manual treatment of airway obstruction as needed for comfort.      02/25/18 1351        Code Status History      Date Active Date Inactive Code Status Order ID Comments User Context   03/04/2018 2023 02/25/2018 1351 Full Code [id number]  Mosie Lukes Inpatient   10/23/2017 1925 10/27/2017 2137 Full Code [id number]  Shelbie Proctor, MD Inpatient   08/14/2016 2016 08/15/2016 1638 Full Code [id number]  Samuella Cota, MD Inpatient   03/30/2016 1906 04/01/2016 1718 Full Code [id number]  Waldemar Dickens, MD ED   04/10/2014 0320 04/14/2014 1738 Full Code [id number]  Phillips Grout, MD Inpatient   08/03/2011 1601 08/05/2011 1621 Full Code [id number]  Rexene Alberts, MD ED   09/16/2010 1949 10/01/2010 1709 Full Code [id number]  Dietrich Pates, RN Inpatient        IV Access:    Peripheral IV   Procedures and diagnostic studies:   No results found.   Medical Consultants:    None.  Anti-Infectives:   IV vancomycin and cefepime  Subjective:    [name] [name] patient is lethargic only responsive to painful stimuli.  Objective:    Vitals:   03/08/18 1400 03/08/18 1553 03/08/18 2015 03/09/18 0509  BP: (!) 134/47 (!) 136/56 (!) 120/58 130/70  Pulse: 63 80 100 65  Resp: (!) 33 (!) 28 20 (!) 22  Temp:  98.8 F (37.1 C) 98.1 F (36.7 C) (!) 102.2 F (39 C)  TempSrc:  Axillary Oral Oral  SpO2: 100% 98% 98% 96%  Weight:   83 kg   Height:        Intake/Output Summary (Last 24 hours) at 03/09/2018 0820 Last data filed at 03/09/2018 0600 Gross per 24 hour  Intake 1036.98 ml  Output 400 ml  Net 636.98 ml   Filed Weights   03/07/18 1711 03/08/18 0358 03/08/18 2015  Weight: 83.6 kg 82.7 kg 83 kg    Exam: General exam: In no acute distress. Respiratory system: Good air movement and clear to auscultation. Cardiovascular system: S1 & S2 heard, RRR.  Gastrointestinal system: Abdomen is nondistended, soft and nontender.  Central nervous system: Confused only alert to person responsive to painful stimuli moving all 4 extremities. Extremities: No pedal edema. Skin: No rashes, lesions or  ulcers .    Data Reviewed:    Labs: Basic Metabolic Panel: Recent Labs  Lab 03/06/18 0618  03/06/18 1630 03/07/18 0431 03/07/18 1549  03/08/18 0522 03/08/18 0912 03/08/18 1601 03/09/18 0345  NA 139   < >  --  134* 140  --  138 138 138 140  K 2.4*   < >  --  5.0 3.2*   < > 5.8* 4.4 4.5 4.9  CL 113*   < >  --  98 102  --  105 102 104 102  CO2 15*   < >  --  17* 25  --  22 22 20* 20*  GLUCOSE 182*   < >  --  218* 103*  --  133* 150* 197* 250*  BUN 58*   < >  --  113* 33*  --  45* 48* 56* 67*  CREATININE 2.75*   < >  --  4.76* 1.85*  --  2.92* 3.27* 3.55* 4.24*  CALCIUM 4.9*   < >  --  8.1* 8.5*  --  7.6* 8.2* 7.9*  8.0*  MG 1.2*  --  2.3 2.3  --   --  2.0  --   --  2.2  PHOS 3.0   < >  --  4.8* 2.0*  --  4.0  --  4.9* 4.8*   < > = values in this interval not displayed.   GFR Estimated Creatinine Clearance: 10.3 mL/min (A) (by C-G formula based on SCr of 4.24 mg/dL (H)). Liver Function Tests: Recent Labs  Lab 03/07/18 0431 03/07/18 1549 03/08/18 0522 03/08/18 1601 03/09/18 0345  ALBUMIN 1.7* 1.9* 1.6* 1.7* 1.8*   Recent Labs  Lab 03/04/18 1507  LIPASE 181*   No results for input(s): AMMONIA in the last 168 hours. Coagulation profile No results for input(s): INR, PROTIME in the last 168 hours.  CBC: Recent Labs  Lab 03/05/18 1707 03/06/18 0100 03/06/18 0358 03/07/18 0431 03/09/18 0345  WBC 22.5* 22.7* 23.7* 19.5* 9.7  HGB 12.6 10.9* 10.4* 10.1* 10.3*  HCT 37.4 33.4* 32.6* 31.4* 33.0*  MCV 86.0 88.8 90.1 89.0 91.4  PLT 92* 85* 86* PLATELET CLUMPS NOTED ON SMEAR, COUNT APPEARS DECREASED 161   Cardiac Enzymes: No results for input(s): CKTOTAL, CKMB, CKMBINDEX, TROPONINI in the last 168 hours. BNP (last 3 results) No results for input(s): PROBNP in the last 8760 hours. CBG: Recent Labs  Lab 03/08/18 1111 03/08/18 1635 03/08/18 2018 03/09/18 0035 03/09/18 0721  GLUCAP 131* 166* 220* 238* 273*   D-Dimer: No results for input(s): DDIMER in the last  72 hours. Hgb A1c: No results for input(s): HGBA1C in the last 72 hours. Lipid Profile: No results for input(s): CHOL, HDL, LDLCALC, TRIG, CHOLHDL, LDLDIRECT in the last 72 hours. Thyroid function studies: No results for input(s): TSH, T4TOTAL, T3FREE, THYROIDAB in the last 72 hours.  Invalid input(s): FREET3 Anemia work up: No results for input(s): VITAMINB12, FOLATE, FERRITIN, TIBC, IRON, RETICCTPCT in the last 72 hours. Sepsis Labs: Recent Labs  Lab 03/06/18 0100 03/06/18 0358 03/07/18 0431 03/09/18 0345  WBC 22.7* 23.7* 19.5* 9.7   Microbiology Recent Results (from the past 240 hour(s))  Culture, respiratory (non-expectorated)     Status: None   Collection Time: 03/04/18  7:00 AM  Result Value Ref Range Status   Specimen Description TRACHEAL ASPIRATE  Final   Special Requests NONE  Final   Gram Stain   Final    MODERATE WBC PRESENT,BOTH PMN AND MONONUCLEAR FEW GRAM POSITIVE COCCI IN PAIRS FEW GRAM NEGATIVE RODS Performed at [facility], [address] [place], [place] [address]    Culture   Final    FEW KLEBSIELLA PNEUMONIAE FEW METHICILLIN RESISTANT STAPHYLOCOCCUS AUREUS    Report Status 03/07/2018 FINAL  Final   Organism ID, Bacteria KLEBSIELLA PNEUMONIAE  Final   Organism ID, Bacteria METHICILLIN RESISTANT STAPHYLOCOCCUS AUREUS  Final      Susceptibility   Klebsiella pneumoniae - MIC*    AMPICILLIN >=32 RESISTANT Resistant     CEFAZOLIN <=4 SENSITIVE Sensitive     CEFEPIME <=1 SENSITIVE Sensitive     CEFTAZIDIME <=1 SENSITIVE Sensitive     CEFTRIAXONE <=1 SENSITIVE Sensitive     CIPROFLOXACIN <=0.25 SENSITIVE Sensitive     GENTAMICIN <=1 SENSITIVE Sensitive     IMIPENEM <=0.25 SENSITIVE Sensitive     TRIMETH/SULFA <=20 SENSITIVE Sensitive     AMPICILLIN/SULBACTAM 4 SENSITIVE Sensitive     PIP/TAZO <=4 SENSITIVE Sensitive     Extended ESBL NEGATIVE Sensitive     * FEW KLEBSIELLA PNEUMONIAE   Methicillin resistant  staphylococcus aureus - MIC*     CIPROFLOXACIN >=8 RESISTANT Resistant     ERYTHROMYCIN >=8 RESISTANT Resistant     GENTAMICIN <=0.5 SENSITIVE Sensitive     OXACILLIN >=4 RESISTANT Resistant     TETRACYCLINE <=1 SENSITIVE Sensitive     VANCOMYCIN <=0.5 SENSITIVE Sensitive     TRIMETH/SULFA 80 RESISTANT Resistant     CLINDAMYCIN >=8 RESISTANT Resistant     RIFAMPIN <=0.5 SENSITIVE Sensitive     Inducible Clindamycin NEGATIVE Sensitive     * FEW METHICILLIN RESISTANT STAPHYLOCOCCUS AUREUS  Blood culture (routine x 2)     Status: None (Preliminary result)   Collection Time: 03/04/18 11:20 AM  Result Value Ref Range Status   Specimen Description BLOOD LEFT HAND  Final   Special Requests   Final    BOTTLES DRAWN AEROBIC ONLY Blood Culture adequate volume   Culture NO GROWTH 4 DAYS  Final   Report Status PENDING  Incomplete  Blood culture (routine x 2)     Status: None (Preliminary result)   Collection Time: 03/04/18 11:30 AM  Result Value Ref Range Status   Specimen Description BLOOD LEFT ARM  Final   Special Requests   Final    BOTTLES DRAWN AEROBIC ONLY Blood Culture adequate volume   Culture NO GROWTH 4 DAYS  Final   Report Status PENDING  Incomplete  C difficile quick scan w PCR reflex     Status: Abnormal   Collection Time: 03/05/18  9:27 AM  Result Value Ref Range Status   C Diff antigen POSITIVE (A) NEGATIVE Final   C Diff toxin NEGATIVE NEGATIVE Final   C Diff interpretation Results are indeterminate. See PCR results.  Final    Comment: Performed at [facility], [place] [address], [place], [place] [address]  C. Diff by PCR, Reflexed     Status: Abnormal   Collection Time: 03/05/18  9:27 AM  Result Value Ref Range Status   Toxigenic C. Difficile by PCR POSITIVE (A) NEGATIVE Final    Comment: Positive for toxigenic C. difficile with little to no toxin production. Only treat if clinical presentation suggests symptomatic illness. Performed at [facility], [place] [address] [place], [place] [address]        Medications:   . aspirin  81 mg Per Tube Daily  . calcium carbonate (dosed in mg elemental calcium)  1,000 mg of elemental calcium Per Tube TID  . chlorhexidine  15 mL Mouth Rinse BID  . Chlorhexidine Gluconate Cloth  6 each Topical Daily  . feeding supplement (PRO-STAT SUGAR FREE 64)  30 mL Per Tube Daily  . insulin aspart  0-15 Units Subcutaneous Q4H  . insulin glargine  15 Units Subcutaneous Daily  . levothyroxine  25 mcg Per Tube Q0600  . mouth rinse  15 mL Mouth Rinse q12n4p  . metoprolol tartrate  6.25 mg Per Tube BID  . sodium chloride flush  10-40 mL Intracatheter Q12H  . vancomycin  125 mg Oral QID   Continuous Infusions: . sodium chloride 10 mL/hr at 03/08/18 1400  . sodium chloride    . sodium chloride    . ceFEPime (MAXIPIME) IV Stopped (03/08/18 1246)  . feeding supplement (VITAL 1.5 CAL) 1,000 mL (03/08/18 1002)  . heparin 1,150 Units/hr (03/09/18 0805)  . [START ON 03/15/18] vancomycin        LOS: 13 days   Charlynne Cousins  [facility]  03/09/2018, 8:20 AM

## 2018-03-09 NOTE — Progress Notes (Signed)
OT Cancellation Note  Patient Details Name: [name] [name] MRN: [id number] DOB: 06-Jan-1931   Cancelled Treatment:    Reason Eval/Treat Not Completed: Medical issues which prohibited therapy(Pt is now comfort care. OT orders discontinued.)  Ebony Hail Harold Hedge) Marsa Aris OTR/L Acute Rehabilitation Services Pager: [phone] Office: [phone]   Fredda Hammed 03/09/2018, 11:41 AM

## 2018-03-09 NOTE — Progress Notes (Signed)
Unable to obtain ABG, attempted twice. Paged Dr Olevia Bowens at [phone]

## 2018-03-09 NOTE — Progress Notes (Signed)
Patient has been made comfort care per MDs order. NG tube has been taken out and a morphine drip has been started. Family now at the bedside. Will continue to monitor.   Farley Ly RN

## 2018-03-09 NOTE — Progress Notes (Addendum)
BP 120/81 (BP Location: Left Arm)   Pulse 66   Temp (!) 102.2 F (39 C) (Oral)   Resp (!) 48   Ht 5\' 5"  (1.651 m)   Wt 83 kg   SpO2 100%   BMI 30.45 kg/m   After placing her on BiPAP and speaking with the family.  I had a long conversation with the family with daughters and granddaughters we had a conversation about her prognosis so it was poor. The family decided to move towards comfort care, they want all the medications and lab stop and start her on medication to make her comfortable. The patient has less than 2 weeks, they would like her if possible to be transferred to a residential hospice facility. I have consulted hospice and palliative care.

## 2018-03-10 DIAGNOSIS — K7200 Acute and subacute hepatic failure without coma: Secondary | ICD-10-CM

## 2018-03-10 DIAGNOSIS — I214 Non-ST elevation (NSTEMI) myocardial infarction: Secondary | ICD-10-CM

## 2018-03-10 DIAGNOSIS — N289 Disorder of kidney and ureter, unspecified: Secondary | ICD-10-CM

## 2018-03-10 LAB — GLUCOSE, CAPILLARY
Glucose-Capillary: 248 mg/dL — ABNORMAL HIGH (ref 70–99)
Glucose-Capillary: 133 mg/dL — ABNORMAL HIGH (ref 70–99)

## 2018-03-10 LAB — HIV ANTIBODY (ROUTINE TESTING W REFLEX): HIV Screen 4th Generation wRfx: NONREACTIVE

## 2018-03-10 MED ORDER — MORPHINE 100MG IN NS 100ML (1MG/ML) PREMIX INFUSION
1.0000 mg/h | INTRAVENOUS | Status: DC
  Filled 2018-03-10: qty 100

## 2018-03-14 LAB — CULTURE, BLOOD (ROUTINE X 2)
Culture: NO GROWTH
Culture: NO GROWTH
Special Requests: ADEQUATE
Special Requests: ADEQUATE

## 2018-04-06 NOTE — Progress Notes (Signed)
Chart reviewed.   Appears that pt is comfort care awaiting hospice bed.  Renal will sign off.  Re-consult as needed.  Madelon Lips MD [facility] pgr [phone]

## 2018-04-06 NOTE — Progress Notes (Signed)
[facility] PROGRESS NOTE    Progress Note  [name] [name]  VZS:[id number] DOB: 01-Dec-1930 DOA: 02/15/2018 PCP: Vidal Schwalbe, MD     Brief Narrative:   [name] [name] is an 85 y.o. female past medical history chronic kidney disease stage IV, atrial fibrillation Eliquis, pulmonary hypertension, diabetes mellitus type 2 presented to any pain with shortness of breath cough and wheezing  Hospital events: 02/07/2018 admitted to the ICU and extubated 03/07/2018 extubated  Procedures: 02/19/2018 right IJ HD catheter 1/20 2020 chest x-ray that showed pulmonary edema 02/12/2018 echocardiogram that showed a reduced EF to 20 to 25% with right RV hypokinesis 03/01/2018 CT of the head that showed no acute findings  Microbiology: 02/28/2018 blood cultures that remain negative till date. 03/04/2018 tracheal aspirate that showed staph aureus and Klebsiella pneumonia 02/19/2018 urine culture showed less than 10,000 colonies    Zosyn 1/22>1/25 Ancef >1/23>1/25 Fortaz > 1/26>28 Cefepime 1/26 >2/3 Linezolid 1/28 > 1/29 Vanc 2/1>2>3  Assessment/Plan:   Acute respiratory failure with hypoxia (HCC) due to MRSA and Klebsiella pneumonia: C. difficile colitis: Toxic encephalopathy: Acute kidney injury on chronic kidney disease 4-5. Hyperkalemia: A. fib with RVR: Non-ST elevation MI/chronic systolic heart failure: Diabetes mellitus type 2: Hypothyroidism: Essential Hypertension: Gout:  The family is comfortable with the decision, they relate they would like her transferred to residential hospice facility. Social worker has been consulted and assisting with transfer. The patient is comfortable in bed, currently on IV morphine sometimes grunting, have explained to the family that this could be that she is uncomfortable and they should tell the nurse.  DVT prophylaxis: heparin Family Communication:  none Disposition Plan/Barrier to D/C: Awaiting residential hospice facility bed. Code  Status:     Code Status Orders  (From admission, onward)         Start     Ordered   02/25/18 1352  Do not attempt resuscitation (DNR)  Continuous    Question Answer Comment  In the event of cardiac or respiratory ARREST Do not call a "code blue"   In the event of cardiac or respiratory ARREST Do not perform Intubation, CPR, defibrillation or ACLS   In the event of cardiac or respiratory ARREST Use medication by any route, position, wound care, and other measures to relive pain and suffering. May use oxygen, suction and manual treatment of airway obstruction as needed for comfort.      02/25/18 1351        Code Status History    Date Active Date Inactive Code Status Order ID Comments User Context   02/23/2018 2023 02/25/2018 1351 Full Code [id number]  Mosie Lukes Inpatient   10/23/2017 1925 10/27/2017 2137 Full Code [id number]  Shelbie Proctor, MD Inpatient   08/14/2016 2016 08/15/2016 1638 Full Code [id number]  Samuella Cota, MD Inpatient   03/30/2016 1906 04/01/2016 1718 Full Code [id number]  Waldemar Dickens, MD ED   04/10/2014 0320 04/14/2014 1738 Full Code [id number]  Phillips Grout, MD Inpatient   08/03/2011 1601 08/05/2011 1621 Full Code [id number]  Rexene Alberts, MD ED   09/16/2010 1949 10/01/2010 1709 Full Code [id number]  Dietrich Pates, RN Inpatient        IV Access:    Peripheral IV   Procedures and diagnostic studies:   Dg Chest Port 1 View  Result Date: 03/09/2018 CLINICAL DATA:  Dyspnea EXAM: PORTABLE CHEST 1 VIEW COMPARISON:  03/05/2018 FINDINGS: Interval extubation. Increased interstitial markings. No frank interstitial edema or focal consolidation. No  pleural effusion or pneumothorax. Cardiomegaly.  Thoracic aortic atherosclerosis. Right IJ venous catheter terminates in the mid SVC. Enteric tube courses into the stomach. IMPRESSION: Interval extubation. Increased interstitial markings without frank interstitial edema. Support apparatus as above.  Electronically Signed   By: Julian Hy M.D.   On: 03/09/2018 11:16     Medical Consultants:    None.  Anti-Infectives:   IV vancomycin and cefepime  Subjective:    [name] [name] Laying comfortable in bed comfortably in bed.  Objective:    Vitals:   03/09/18 0935 03/09/18 1130 03/09/18 2031 03-31-18 0931  BP:   (!) 149/66 (!) 147/95  Pulse: 66  100 62  Resp: (!) 48  (!) 22 (!) 24  Temp:  (!) 100.8 F (38.2 C) (!) 100.7 F (38.2 C) (!) 100.7 F (38.2 C)  TempSrc:  Axillary Oral Axillary  SpO2: 100%  92% (!) 86%  Weight:   83 kg   Height:        Intake/Output Summary (Last 24 hours) at 2018-03-31 0937 Last data filed at March 31, 2018 0900 Gross per 24 hour  Intake 12 ml  Output 0 ml  Net 12 ml   Filed Weights   03/08/18 0358 03/08/18 2015 03/09/18 2031  Weight: 82.7 kg 83 kg 83 kg    Exam: General exam: In no acute distress. Respiratory system: Good air movement and clear to auscultation. Cardiovascular system: S1 & S2 heard, RRR.  Gastrointestinal system: Abdomen is nondistended, soft and nontender.  Central nervous system: Confused only alert to person responsive to painful stimuli moving all 4 extremities. Extremities: No pedal edema. Skin: No rashes, lesions or ulcers .    Data Reviewed:    Labs: Basic Metabolic Panel: Recent Labs  Lab 03/06/18 0618  03/06/18 1630 03/07/18 0431 03/07/18 1549  03/08/18 0522 03/08/18 0912 03/08/18 1601 03/09/18 0345  NA 139   < >  --  134* 140  --  138 138 138 140  K 2.4*   < >  --  5.0 3.2*   < > 5.8* 4.4 4.5 4.9  CL 113*   < >  --  98 102  --  105 102 104 102  CO2 15*   < >  --  17* 25  --  22 22 20* 20*  GLUCOSE 182*   < >  --  218* 103*  --  133* 150* 197* 250*  BUN 58*   < >  --  113* 33*  --  45* 48* 56* 67*  CREATININE 2.75*   < >  --  4.76* 1.85*  --  2.92* 3.27* 3.55* 4.24*  CALCIUM 4.9*   < >  --  8.1* 8.5*  --  7.6* 8.2* 7.9* 8.0*  MG 1.2*  --  2.3 2.3  --   --  2.0  --   --  2.2  PHOS 3.0    < >  --  4.8* 2.0*  --  4.0  --  4.9* 4.8*   < > = values in this interval not displayed.   GFR Estimated Creatinine Clearance: 10.3 mL/min (A) (by C-G formula based on SCr of 4.24 mg/dL (H)). Liver Function Tests: Recent Labs  Lab 03/07/18 0431 03/07/18 1549 03/08/18 0522 03/08/18 1601 03/09/18 0345  ALBUMIN 1.7* 1.9* 1.6* 1.7* 1.8*   Recent Labs  Lab 03/04/18 1507  LIPASE 181*   No results for input(s): AMMONIA in the last 168 hours. Coagulation profile No results for input(s): INR,  PROTIME in the last 168 hours.  CBC: Recent Labs  Lab 03/05/18 1707 03/06/18 0100 03/06/18 0358 03/07/18 0431 03/09/18 0345  WBC 22.5* 22.7* 23.7* 19.5* 9.7  HGB 12.6 10.9* 10.4* 10.1* 10.3*  HCT 37.4 33.4* 32.6* 31.4* 33.0*  MCV 86.0 88.8 90.1 89.0 91.4  PLT 92* 85* 86* PLATELET CLUMPS NOTED ON SMEAR, COUNT APPEARS DECREASED 161   Cardiac Enzymes: No results for input(s): CKTOTAL, CKMB, CKMBINDEX, TROPONINI in the last 168 hours. BNP (last 3 results) No results for input(s): PROBNP in the last 8760 hours. CBG: Recent Labs  Lab 03/08/18 2018 03/09/18 0035 03/09/18 0517 03/09/18 0721 03/25/18 0735  GLUCAP 220* 238* 248* 273* 133*   D-Dimer: No results for input(s): DDIMER in the last 72 hours. Hgb A1c: No results for input(s): HGBA1C in the last 72 hours. Lipid Profile: No results for input(s): CHOL, HDL, LDLCALC, TRIG, CHOLHDL, LDLDIRECT in the last 72 hours. Thyroid function studies: No results for input(s): TSH, T4TOTAL, T3FREE, THYROIDAB in the last 72 hours.  Invalid input(s): FREET3 Anemia work up: No results for input(s): VITAMINB12, FOLATE, FERRITIN, TIBC, IRON, RETICCTPCT in the last 72 hours. Sepsis Labs: Recent Labs  Lab 03/06/18 0100 03/06/18 0358 03/07/18 0431 03/09/18 0345  WBC 22.7* 23.7* 19.5* 9.7   Microbiology Recent Results (from the past 240 hour(s))  Culture, respiratory (non-expectorated)     Status: None   Collection Time: 03/04/18  7:00  AM  Result Value Ref Range Status   Specimen Description TRACHEAL ASPIRATE  Final   Special Requests NONE  Final   Gram Stain   Final    MODERATE WBC PRESENT,BOTH PMN AND MONONUCLEAR FEW GRAM POSITIVE COCCI IN PAIRS FEW GRAM NEGATIVE RODS Performed at [facility], [address], [place], [place] [address]    Culture   Final    FEW KLEBSIELLA PNEUMONIAE FEW METHICILLIN RESISTANT STAPHYLOCOCCUS AUREUS    Report Status 03/07/2018 FINAL  Final   Organism ID, Bacteria KLEBSIELLA PNEUMONIAE  Final   Organism ID, Bacteria METHICILLIN RESISTANT STAPHYLOCOCCUS AUREUS  Final      Susceptibility   Klebsiella pneumoniae - MIC*    AMPICILLIN >=32 RESISTANT Resistant     CEFAZOLIN <=4 SENSITIVE Sensitive     CEFEPIME <=1 SENSITIVE Sensitive     CEFTAZIDIME <=1 SENSITIVE Sensitive     CEFTRIAXONE <=1 SENSITIVE Sensitive     CIPROFLOXACIN <=0.25 SENSITIVE Sensitive     GENTAMICIN <=1 SENSITIVE Sensitive     IMIPENEM <=0.25 SENSITIVE Sensitive     TRIMETH/SULFA <=20 SENSITIVE Sensitive     AMPICILLIN/SULBACTAM 4 SENSITIVE Sensitive     PIP/TAZO <=4 SENSITIVE Sensitive     Extended ESBL NEGATIVE Sensitive     * FEW KLEBSIELLA PNEUMONIAE   Methicillin resistant staphylococcus aureus - MIC*    CIPROFLOXACIN >=8 RESISTANT Resistant     ERYTHROMYCIN >=8 RESISTANT Resistant     GENTAMICIN <=0.5 SENSITIVE Sensitive     OXACILLIN >=4 RESISTANT Resistant     TETRACYCLINE <=1 SENSITIVE Sensitive     VANCOMYCIN <=0.5 SENSITIVE Sensitive     TRIMETH/SULFA 80 RESISTANT Resistant     CLINDAMYCIN >=8 RESISTANT Resistant     RIFAMPIN <=0.5 SENSITIVE Sensitive     Inducible Clindamycin NEGATIVE Sensitive     * FEW METHICILLIN RESISTANT STAPHYLOCOCCUS AUREUS  Blood culture (routine x 2)     Status: None   Collection Time: 03/04/18 11:20 AM  Result Value Ref Range Status   Specimen Description BLOOD LEFT HAND  Final  Special Requests   Final    BOTTLES DRAWN AEROBIC ONLY Blood Culture adequate  volume   Culture NO GROWTH 5 DAYS  Final   Report Status 03/09/2018 FINAL  Final  Blood culture (routine x 2)     Status: None   Collection Time: 03/04/18 11:30 AM  Result Value Ref Range Status   Specimen Description BLOOD LEFT ARM  Final   Special Requests   Final    BOTTLES DRAWN AEROBIC ONLY Blood Culture adequate volume   Culture NO GROWTH 5 DAYS  Final   Report Status 03/09/2018 FINAL  Final  C difficile quick scan w PCR reflex     Status: Abnormal   Collection Time: 03/05/18  9:27 AM  Result Value Ref Range Status   C Diff antigen POSITIVE (A) NEGATIVE Final   C Diff toxin NEGATIVE NEGATIVE Final   C Diff interpretation Results are indeterminate. See PCR results.  Final    Comment: Performed at [facility], [place] [address], [place], [place] [address]  C. Diff by PCR, Reflexed     Status: Abnormal   Collection Time: 03/05/18  9:27 AM  Result Value Ref Range Status   Toxigenic C. Difficile by PCR POSITIVE (A) NEGATIVE Final    Comment: Positive for toxigenic C. difficile with little to no toxin production. Only treat if clinical presentation suggests symptomatic illness. Performed at [facility], [place] [address] [place], [place] [address]   Culture, blood (Routine X 2) w Reflex to ID Panel     Status: None (Preliminary result)   Collection Time: 03/09/18  8:42 AM  Result Value Ref Range Status   Specimen Description BLOOD LEFT ANTECUBITAL  Final   Special Requests AEROBIC BOTTLE ONLY Blood Culture adequate volume  Final   Culture   Final    NO GROWTH < 24 HOURS Performed at [facility], [place] [address], [place], [place] [address]    Report Status PENDING  Incomplete  Culture, blood (Routine X 2) w Reflex to ID Panel     Status: None (Preliminary result)   Collection Time: 03/09/18  8:42 AM  Result Value Ref Range Status   Specimen Description BLOOD BLOOD RIGHT HAND  Final   Special Requests AEROBIC BOTTLE ONLY Blood Culture adequate volume  Final    Culture   Final    NO GROWTH < 24 HOURS Performed at [facility], [place] [address], [place], [place] [address]    Report Status PENDING  Incomplete     Medications:    Continuous Infusions: . morphine 1 mg/hr (03/09/18 1109)      LOS: 14 days   Charlynne Cousins  [facility]  Apr 06, 2018, 9:37 AM

## 2018-04-06 NOTE — Discharge Summary (Signed)
Death Summary  [name] [name] HRC:[id number] DOB: Jul 04, 1930 DOA: 2018/02/25  PCP: Vidal Schwalbe, MD  Admit date: 2018/02/25 Date of Death: 03/12/18  Final Diagnoses:  Acute respiratory failure with hypoxia due to MRSA and Klebsiella pneumonia C. difficile colitis Toxic encephalopathy Acute kidney injury on chronic kidney disease stage III-IV Hyperkalemia A. fib with RVR Non-ST elevation MI Chronic systolic heart failure Diabetes mellitus type 2  History of present illness:  [name] [name] is an 85 y.o. female past medical history chronic kidney disease stage IV, atrial fibrillation Eliquis, pulmonary hypertension, diabetes mellitus type 2 presented to any pain with shortness of breath cough and wheezing  Hospital Course:  Acute respiratory failure with hypoxia due to MRSA and Klebsiella pneumonia PCCM and intubated on 02/27/2018 BAL also MRSA and Klebsiella she can completed antibiotic regimen. Extubated on 03/08/2018 and [facility]. When I went to the room the patient was breathing about 30-40 times per minute with new fevers and leukocytosis there was a concern about aspiration she was started empirically on IV vancomycin and cefepime it was discussed with the family that she had a very poor prognoses and that she would likely not survive this hospital stay. The family decided to move towards comfort care they wanted all the labs and medication stop and make her comfortable.  She passed away in house on Mar 11, 2018.  C. difficile colitis: She started having diarrhea PCR came back positive on 03/05/2018 she was started on oral bank.  Toxic encephalopathy: Likely due to infectious etiology.  Acute kidney injury on chronic kidney disease stage III: Nephrology was consulted due to progression of his renal disease, temporary HD catheter was placed she received hemodialysis.  Hyperkalemia: Resolved with dialysis.  Atrial fibrillation with RVR:  Non-ST elevation  MI: She was treated with aspirin and beta-blockers etiology was consulted deemed her not a candidate for aggressive intervention.  Diabetes mellitus type 2: She was managed with long-acting insulin plus sliding scale. The results of significant diagnostics from this hospitalization (including imaging, microbiology, ancillary and laboratory) are listed below for reference.    Significant Diagnostic Studies: Ct Head Wo Contrast  Result Date: 03/01/2018 CLINICAL DATA:  Altered mental status. EXAM: CT HEAD WITHOUT CONTRAST TECHNIQUE: Contiguous axial images were obtained from the base of the skull through the vertex without intravenous contrast. COMPARISON:  03/30/2016 FINDINGS: Brain: Chronic generalized atrophy. No sign of old or acute focal infarction, mass lesion, hemorrhage, hydrocephalus or extra-axial collection. Vascular: There is atherosclerotic calcification of the major vessels at the base of the brain. Skull: Negative Sinuses/Orbits: Clear/normal Other: None IMPRESSION: No acute or traumatic finding. Chronic generalized atrophy. Electronically Signed   By: Nelson Chimes M.D.   On: 03/01/2018 18:04   US Renal  Result Date: 02/25/2018 CLINICAL DATA:  Acute renal disease EXAM: RENAL / URINARY TRACT ULTRASOUND COMPLETE COMPARISON:  CT 06/19/2016 FINDINGS: Right Kidney: Renal measurements: 8.5 cm length by 4.4 cm height by 4.3 cm wide = volume: 83.1 mL. Cortical echogenicity normal. Cyst in the midpole measuring 1.4 x 1.5 x 1.5 cm. Left Kidney: Renal measurements: 8.9 cm length by 4.7 cm height by 5.2 cm wide = volume: 114.4 mL. Echogenicity within normal limits. No mass or hydronephrosis visualized. Bladder: The bladder is empty IMPRESSION: 1. Negative for hydronephrosis. 2. Single cyst in the right kidney. Electronically Signed   By: Donavan Foil M.D.   On: 02/25/2018 01:20   Dg Chest Port 1 View  Result Date: 03/09/2018 CLINICAL DATA:  Dyspnea EXAM: PORTABLE CHEST 1 VIEW COMPARISON:   03/05/2018 FINDINGS: Interval extubation. Increased interstitial markings. No frank interstitial edema or focal consolidation. No pleural effusion or pneumothorax. Cardiomegaly.  Thoracic aortic atherosclerosis. Right IJ venous catheter terminates in the mid SVC. Enteric tube courses into the stomach. IMPRESSION: Interval extubation. Increased interstitial markings without frank interstitial edema. Support apparatus as above. Electronically Signed   By: Julian Hy M.D.   On: 03/09/2018 11:16   Dg Chest Port 1 View  Result Date: 03/05/2018 CLINICAL DATA:  Tachypnea EXAM: PORTABLE CHEST 1 VIEW COMPARISON:  03/03/2018 FINDINGS: Cardiac shadow remains enlarged. Aortic calcifications are again noted. Endotracheal tube, gastric catheter and right jugular central line are again seen and stable. Mild vascular congestion is again seen and stable. No significant interstitial edema is noted. Some patchy atelectatic changes are seen in the right lung. No bony abnormality noted. IMPRESSION: Stable vascular congestion with mild atelectatic changes on the right. Electronically Signed   By: Inez Catalina M.D.   On: 03/05/2018 08:23   Dg Chest Port 1 View  Result Date: 03/03/2018 CLINICAL DATA:  Respiratory failure. EXAM: PORTABLE CHEST 1 VIEW COMPARISON:  03/02/2018 FINDINGS: Endotracheal tube terminates 2.5 cm above the carina. Right jugular catheter terminates over the upper SVC, unchanged. Enteric tube courses into the left upper abdomen with tip not imaged. The cardiac silhouette remains enlarged. Mild pulmonary vascular congestion is unchanged. No airspace consolidation, pleural effusion, or pneumothorax is identified. IMPRESSION: Unchanged cardiomegaly and mild pulmonary vascular congestion. Electronically Signed   By: Logan Bores M.D.   On: 03/03/2018 06:40   Dg Chest Port 1 View  Result Date: 03/02/2018 CLINICAL DATA:  Respiratory failure. EXAM: PORTABLE CHEST 1 VIEW COMPARISON:  Chest x-rays dated  02/25/2018 and 02/14/2018 FINDINGS: Endotracheal tube is well positioned with tip just above the level of the carina. RIGHT IJ catheter is stable in position. Enteric tube passes below the diaphragm. Stable cardiomegaly. Mild central pulmonary vascular congestion, likely chronic. No evidence of overt alveolar pulmonary edema. No confluent opacity to suggest a developing pneumonia. No pleural effusion or pneumothorax seen. IMPRESSION: 1. Endotracheal tube well positioned with tip just above the level of the carina. 2. Mild CHF, likely chronic. No evidence of pneumonia or overt alveolar pulmonary edema. Electronically Signed   By: Franki Cabot M.D.   On: 03/02/2018 06:25   Dg Chest Port 1 View  Result Date: 02/25/2018 CLINICAL DATA:  85 year old female respiratory failure. Subsequent encounter. EXAM: PORTABLE CHEST 1 VIEW COMPARISON:  02/21/2018 FINDINGS: Endotracheal tube tip 2.3 cm above the carina. Nasogastric tube courses below the diaphragm. Tip is not included on the present exam. Right central line tip proximal superior vena cava level. Cardiomegaly. Can not exclude pericardial effusion. Calcified aorta. Mid to lower lobe subsegmental atelectasis bilaterally. Left base infiltrate not excluded. Slight decrease in degree of pulmonary vascular congestion (most notable centrally). IMPRESSION: 1. Slight decrease in degree of pulmonary vascular congestion. 2. Left base atelectasis versus infiltrate. 3. Subsegmental atelectasis mid to lower lobe bilaterally. 4. Prominent cardiomegaly. 5.  Aortic Atherosclerosis (ICD10-I70.0). 6. Nasogastric tube placed. Tip not included on present exam. Endotracheal tube and right central line once again noted. Electronically Signed   By: Genia Del M.D.   On: 02/25/2018 06:25   Portable Chest X-ray  Result Date: 03/07/2018 CLINICAL DATA:  Respiratory failure EXAM: PORTABLE CHEST 1 VIEW COMPARISON:  02/15/2018 FINDINGS: Cardiac shadow remains enlarged. Endotracheal tube is  now seen in satisfactory position 2 cm above the carina.  Central venous catheter is noted on the right extending to the proximal superior vena cava. Mild vascular congestion is noted. No focal confluent infiltrate is seen. No pneumothorax is noted. IMPRESSION: Endotracheal tube in satisfactory position. Right jugular central line noted in the proximal superior vena cava. Mild vascular congestion without focal infiltrate. Electronically Signed   By: Inez Catalina M.D.   On: 02/26/2018 21:50   Dg Chest Port 1 View  Result Date: 02/20/2018 CLINICAL DATA:  Shortness of breath and cough today. EXAM: PORTABLE CHEST 1 VIEW COMPARISON:  PA and lateral chest 10/23/2017 and 06/01/2017. FINDINGS: There is marked cardiomegaly. Pulmonary edema is present. No pneumothorax or pleural effusion. Aortic atherosclerosis is noted. IMPRESSION: Cardiomegaly and pulmonary edema. Atherosclerosis Electronically Signed   By: Inge Rise M.D.   On: 03/06/2018 17:06    Microbiology: Recent Results (from the past 240 hour(s))  Culture, respiratory (non-expectorated)     Status: None   Collection Time: 03/04/18  7:00 AM  Result Value Ref Range Status   Specimen Description TRACHEAL ASPIRATE  Final   Special Requests NONE  Final   Gram Stain   Final    MODERATE WBC PRESENT,BOTH PMN AND MONONUCLEAR FEW GRAM POSITIVE COCCI IN PAIRS FEW GRAM NEGATIVE RODS Performed at [facility], [place] [address], [place], [place] [address]    Culture   Final    FEW KLEBSIELLA PNEUMONIAE FEW METHICILLIN RESISTANT STAPHYLOCOCCUS AUREUS    Report Status 03/07/2018 FINAL  Final   Organism ID, Bacteria KLEBSIELLA PNEUMONIAE  Final   Organism ID, Bacteria METHICILLIN RESISTANT STAPHYLOCOCCUS AUREUS  Final      Susceptibility   Klebsiella pneumoniae - MIC*    AMPICILLIN >=32 RESISTANT Resistant     CEFAZOLIN <=4 SENSITIVE Sensitive     CEFEPIME <=1 SENSITIVE Sensitive     CEFTAZIDIME <=1 SENSITIVE Sensitive     CEFTRIAXONE <=1  SENSITIVE Sensitive     CIPROFLOXACIN <=0.25 SENSITIVE Sensitive     GENTAMICIN <=1 SENSITIVE Sensitive     IMIPENEM <=0.25 SENSITIVE Sensitive     TRIMETH/SULFA <=20 SENSITIVE Sensitive     AMPICILLIN/SULBACTAM 4 SENSITIVE Sensitive     PIP/TAZO <=4 SENSITIVE Sensitive     Extended ESBL NEGATIVE Sensitive     * FEW KLEBSIELLA PNEUMONIAE   Methicillin resistant staphylococcus aureus - MIC*    CIPROFLOXACIN >=8 RESISTANT Resistant     ERYTHROMYCIN >=8 RESISTANT Resistant     GENTAMICIN <=0.5 SENSITIVE Sensitive     OXACILLIN >=4 RESISTANT Resistant     TETRACYCLINE <=1 SENSITIVE Sensitive     VANCOMYCIN <=0.5 SENSITIVE Sensitive     TRIMETH/SULFA 80 RESISTANT Resistant     CLINDAMYCIN >=8 RESISTANT Resistant     RIFAMPIN <=0.5 SENSITIVE Sensitive     Inducible Clindamycin NEGATIVE Sensitive     * FEW METHICILLIN RESISTANT STAPHYLOCOCCUS AUREUS  Blood culture (routine x 2)     Status: None   Collection Time: 03/04/18 11:20 AM  Result Value Ref Range Status   Specimen Description BLOOD LEFT HAND  Final   Special Requests   Final    BOTTLES DRAWN AEROBIC ONLY Blood Culture adequate volume   Culture NO GROWTH 5 DAYS  Final   Report Status 03/09/2018 FINAL  Final  Blood culture (routine x 2)     Status: None   Collection Time: 03/04/18 11:30 AM  Result Value Ref Range Status   Specimen Description BLOOD LEFT ARM  Final   Special Requests   Final  BOTTLES DRAWN AEROBIC ONLY Blood Culture adequate volume   Culture NO GROWTH 5 DAYS  Final   Report Status 03/09/2018 FINAL  Final  C difficile quick scan w PCR reflex     Status: Abnormal   Collection Time: 03/05/18  9:27 AM  Result Value Ref Range Status   C Diff antigen POSITIVE (A) NEGATIVE Final   C Diff toxin NEGATIVE NEGATIVE Final   C Diff interpretation Results are indeterminate. See PCR results.  Final    Comment: Performed at [facility], [place] [address], [place], [place] [address]  C. Diff by PCR, Reflexed      Status: Abnormal   Collection Time: 03/05/18  9:27 AM  Result Value Ref Range Status   Toxigenic C. Difficile by PCR POSITIVE (A) NEGATIVE Final    Comment: Positive for toxigenic C. difficile with little to no toxin production. Only treat if clinical presentation suggests symptomatic illness. Performed at [facility], [place] [address], [place], [place] [address]   Culture, blood (Routine X 2) w Reflex to ID Panel     Status: None (Preliminary result)   Collection Time: 03/09/18  8:42 AM  Result Value Ref Range Status   Specimen Description BLOOD LEFT ANTECUBITAL  Final   Special Requests AEROBIC BOTTLE ONLY Blood Culture adequate volume  Final   Culture   Final    NO GROWTH 2 DAYS Performed at [facility], [place] [address], [place], [place] [address]    Report Status PENDING  Incomplete  Culture, blood (Routine X 2) w Reflex to ID Panel     Status: None (Preliminary result)   Collection Time: 03/09/18  8:42 AM  Result Value Ref Range Status   Specimen Description BLOOD BLOOD RIGHT HAND  Final   Special Requests AEROBIC BOTTLE ONLY Blood Culture adequate volume  Final   Culture   Final    NO GROWTH 2 DAYS Performed at [facility], [place] [address], [place], [place] [address]    Report Status PENDING  Incomplete     Labs: Basic Metabolic Panel: Recent Labs  Lab 03/06/18 0618  03/06/18 1630 03/07/18 0431 03/07/18 1549  03/08/18 0522 03/08/18 0912 03/08/18 1601 03/09/18 0345  NA 139   < >  --  134* 140  --  138 138 138 140  K 2.4*   < >  --  5.0 3.2*   < > 5.8* 4.4 4.5 4.9  CL 113*   < >  --  98 102  --  105 102 104 102  CO2 15*   < >  --  17* 25  --  22 22 20* 20*  GLUCOSE 182*   < >  --  218* 103*  --  133* 150* 197* 250*  BUN 58*   < >  --  113* 33*  --  45* 48* 56* 67*  CREATININE 2.75*   < >  --  4.76* 1.85*  --  2.92* 3.27* 3.55* 4.24*  CALCIUM 4.9*   < >  --  8.1* 8.5*  --  7.6* 8.2* 7.9* 8.0*  MG 1.2*  --  2.3 2.3  --   --  2.0  --   --  2.2  PHOS  3.0   < >  --  4.8* 2.0*  --  4.0  --  4.9* 4.8*   < > = values in this interval not displayed.   Liver Function Tests: Recent Labs  Lab 03/07/18 0431 03/07/18 1549 03/08/18 0522 03/08/18 1601 03/09/18 0345  ALBUMIN 1.7* 1.9* 1.6* 1.7* 1.8*   Recent Labs  Lab 03/04/18 1507  LIPASE 181*   No results for input(s): AMMONIA in the last 168 hours. CBC: Recent Labs  Lab 03/05/18 1707 03/06/18 0100 03/06/18 0358 03/07/18 0431 03/09/18 0345  WBC 22.5* 22.7* 23.7* 19.5* 9.7  HGB 12.6 10.9* 10.4* 10.1* 10.3*  HCT 37.4 33.4* 32.6* 31.4* 33.0*  MCV 86.0 88.8 90.1 89.0 91.4  PLT 92* 85* 86* PLATELET CLUMPS NOTED ON SMEAR, COUNT APPEARS DECREASED 161   Cardiac Enzymes: No results for input(s): CKTOTAL, CKMB, CKMBINDEX, TROPONINI in the last 168 hours. BNP: Invalid input(s): POCBNP CBG: Recent Labs  Lab 03/08/18 2018 03/09/18 0035 03/09/18 0517 03/09/18 0721 April 03, 2018 0735  GLUCAP 220* 238* 248* 273* 133*      Signed:   [facility] 03/11/2018, 10:47 AM

## 2018-04-06 NOTE — Care Management Note (Signed)
Case Management Note Note initiated by Waunita Schooner RN CM  Patient Details  Name: [name] [name] MRN: [id number] Date of Birth: May 26, 1930  Subjective/Objective:   Pt admitted with shock ,sepsis and HF                   Action/Plan:  PTA from home.     Expected Discharge Date:                  Expected Discharge Plan:  [place]  In-House Referral:  Clinical Social Work  Discharge planning Services  CM Consult  Post Acute Care Choice:  Hospice Choice offered to:  Adult Children  DME Arranged:  N/A DME Agency:  NA  HH Arranged:  NA HH Agency:  [facility]  Status of Service:  Completed, signed off  If discussed at [place] of Stay Meetings, dates discussed:    Additional Comments:  04-04-2018-Pt transferred to the floor. Developed respiratory distress. Decision for comfort care and hospice. Pt passed away this afternoon in hospital.   04/04/2018' Pt remains on ventilator - however plans are for extubation today.  Pt now on IHD for AKI via temp catheter.  CM will continue to follow for discharge planning  02/27/18 Pt remains on ventilator, CRRT and pressors.  Pt has a guarded prognosis and [facility] discussions scheduled for Friday 02/28/18  Bartholomew Crews, RN April 04, 2018, 3:29 PM

## 2018-04-06 NOTE — Progress Notes (Signed)
[facility] RN verified pt with no breathing and no pulse, time of death: 1440pm; family at bedside. MD notified.

## 2018-04-06 NOTE — Clinical Social Work Note (Addendum)
Patient has been made comfort care and consult received for residential hospice. Talked with daughter Silva Bandy at bedside and [facility] is the requested facility. Call made to [facility], [place] liaison and referral made. CSW will continue to follow, provide SW intervention services as needed and facilitate discharge to a hospice facility when a bed becomes available.  1:26 am - Was updated by Hospice staff person Bevely Palmer regarding patient's probable d/c to [facility]. See Ms. Robertson's progress   2:55 pm - CSW advised by nurse Angela Nevin that patient has died. When asked, CSW informed  By nurse that family at the bedside. Audrea Muscat with [facility] contacted and informed. CSW signing off/    Drea Jurewicz Givens, MSW, LCSW Licensed Clinical Social Worker Clinical Social Work [place] [phone]

## 2018-04-06 NOTE — Progress Notes (Signed)
[facility]) hospital liaison note.  Received request from Lorriane Shire, [place] for family interest in [facility] with request for transfer  tomorrow . Chart reviewed and eligibilty confirmed. Met with  family to confirm interest and explain services. Family agreeable to transfer today on 03/11/2018 if patient is stable for transfer. Physician will re-evaluate later today to determine if patient is stable for transport. CSW aware.   Registration paper work completed. Dr. Orpah Melter to assume care per family request. Please fax discharge summary to [phone]. RN please call report to [phone].  Please arrange transport in the am on 03/11/2018.  Thank you,   Farrel Gordon, RN, [facility] Liaison  [place] are on AMION

## 2018-04-06 DEATH — deceased

## 2019-12-06 IMAGING — CT CT HEAD W/O CM
4 series · 17 of 47 positions shown, 19 images · non-contrast
Comparison: 03/30/2016

CLINICAL DATA: Altered mental status.

EXAM:
CT HEAD WITHOUT CONTRAST
TECHNIQUE: Contiguous axial images were obtained from the base of the skull
through the vertex without intravenous contrast.

[Series 3: head wo · axial · 0.46mm/px · z∈[+1346,+1466]mm · 7 of 34 slices shown, 9 images]
[im 5/34  brain]
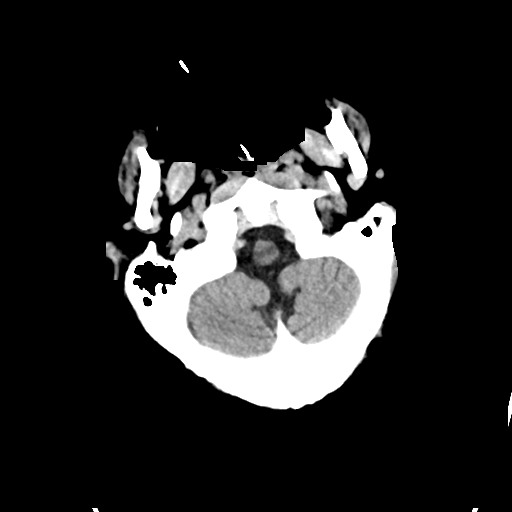
[im 5/34  bone]
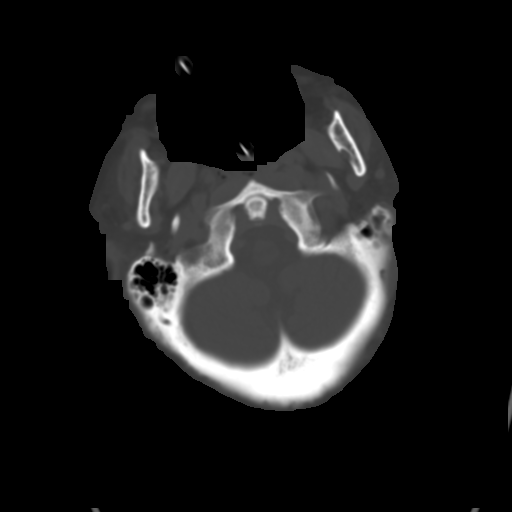
[im 9/34  brain]
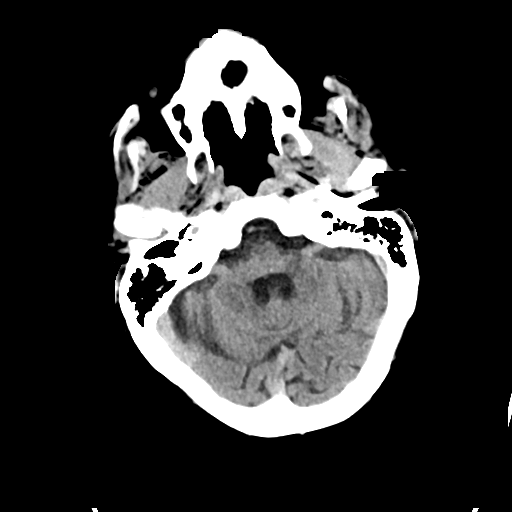
[im 13/34  brain]
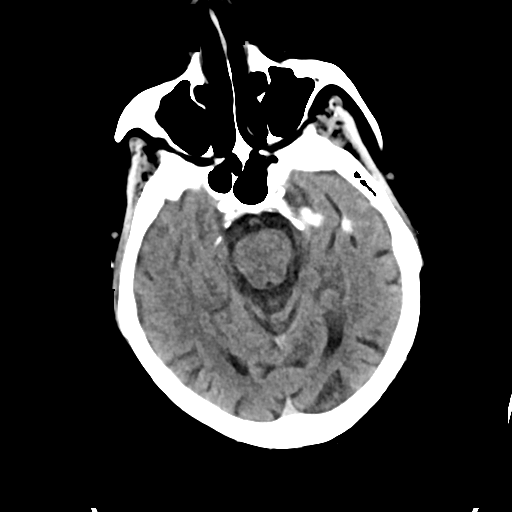
[im 17/34  brain]
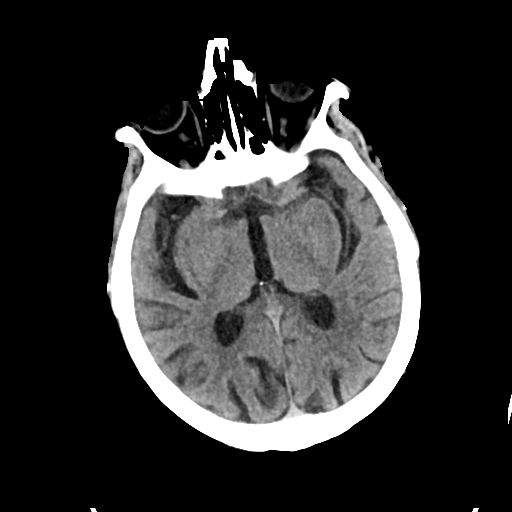
[im 21/34  brain]
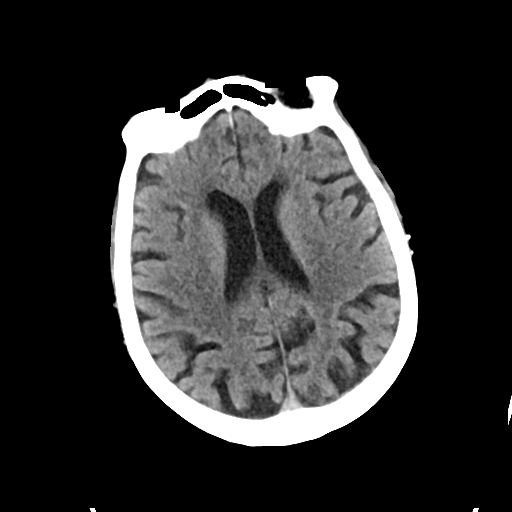
[im 21/34  bone]
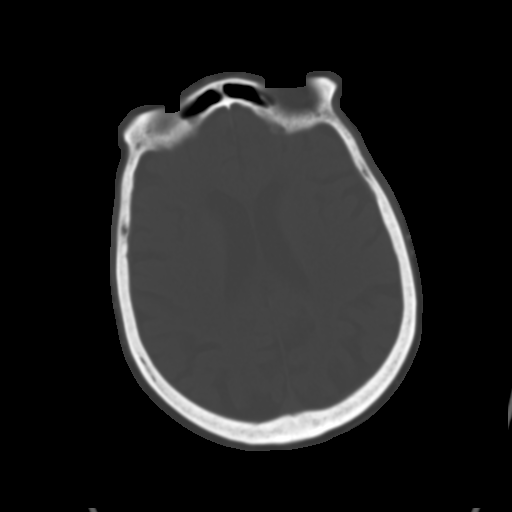
[im 25/34  brain]
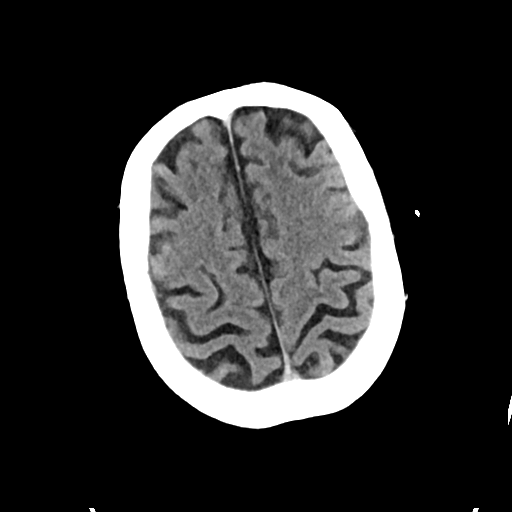
[im 29/34  brain]
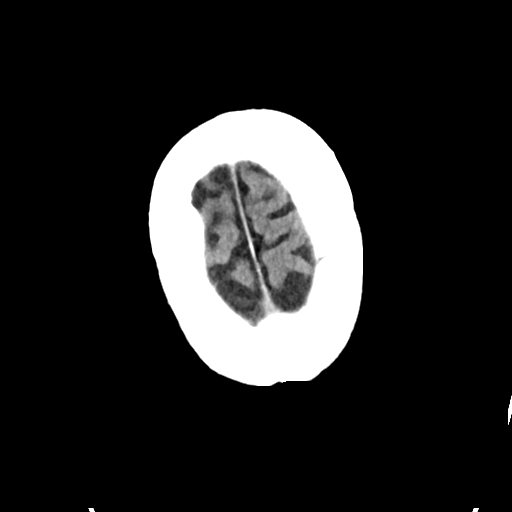

[Series 4: head bone · axial · 0.46mm/px · z∈[+1342,+1400]mm · 4 of 84 slices shown]
[im 9/84  bone]
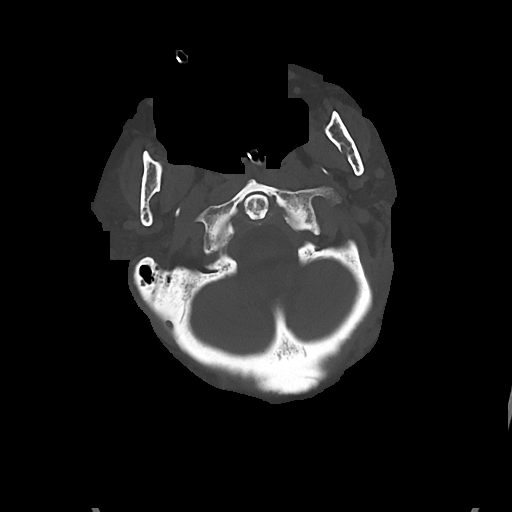
[im 17/84  bone]
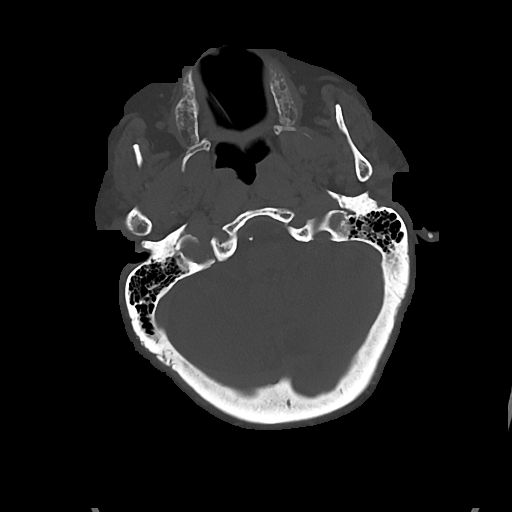
[im 25/84  bone]
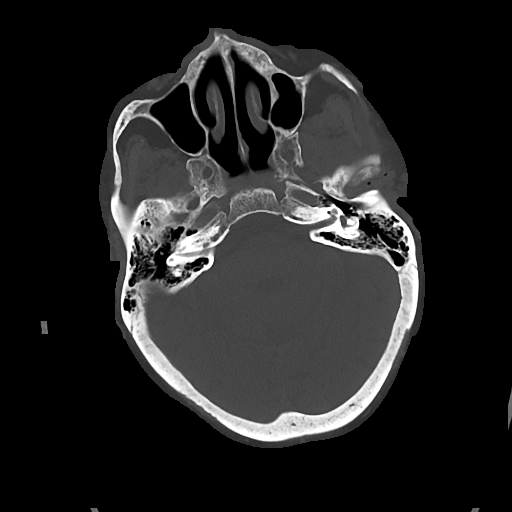
[im 38/84  bone]
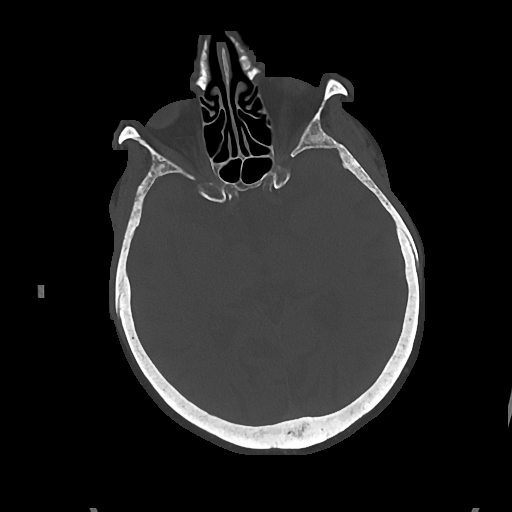

[Series 5: cor soft · coronal · 0.37mm/px · 3 of 62 slices shown]
[im 21/62  brain]
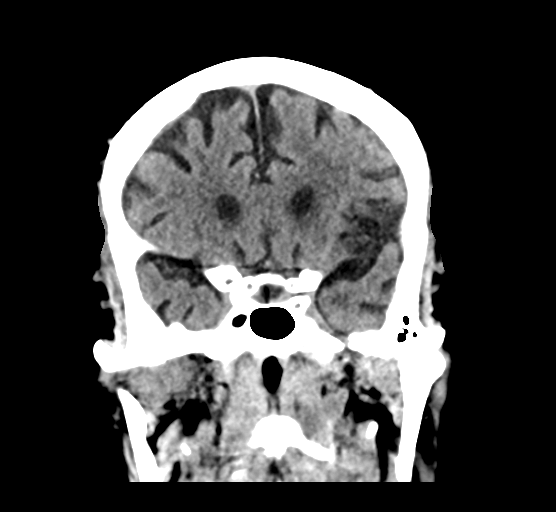
[im 28/62  brain]
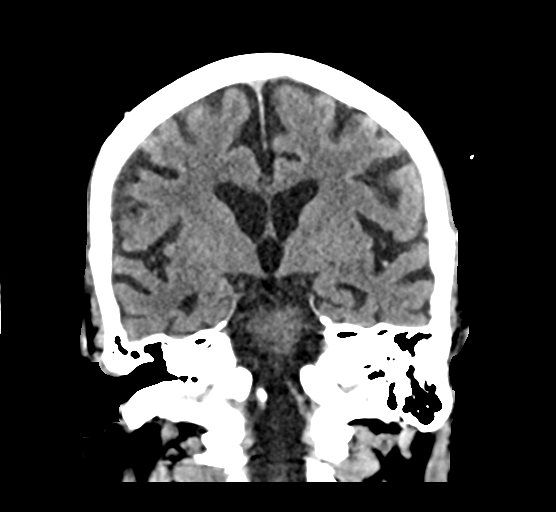
[im 34/62  brain]
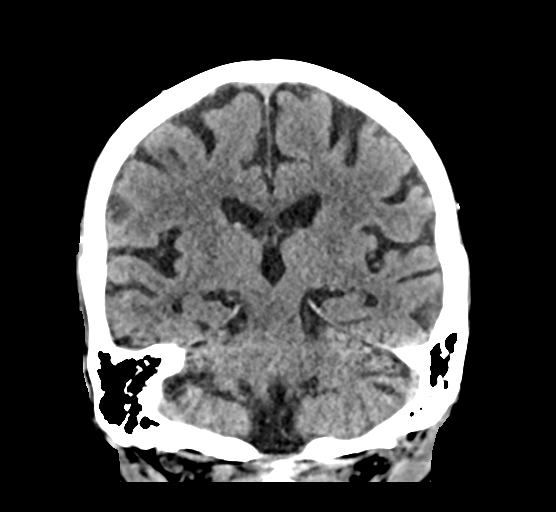

[Series 6: sag soft · sagittal · 0.35mm/px · 3 of 50 slices shown]
[im 17/50  brain]
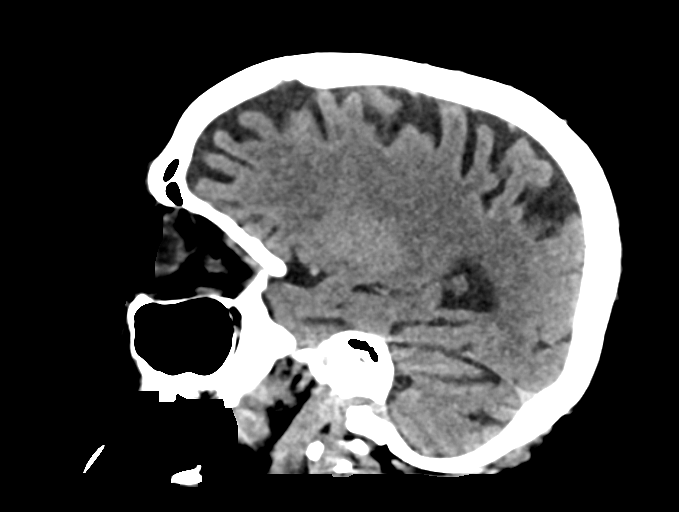
[im 25/50  brain]
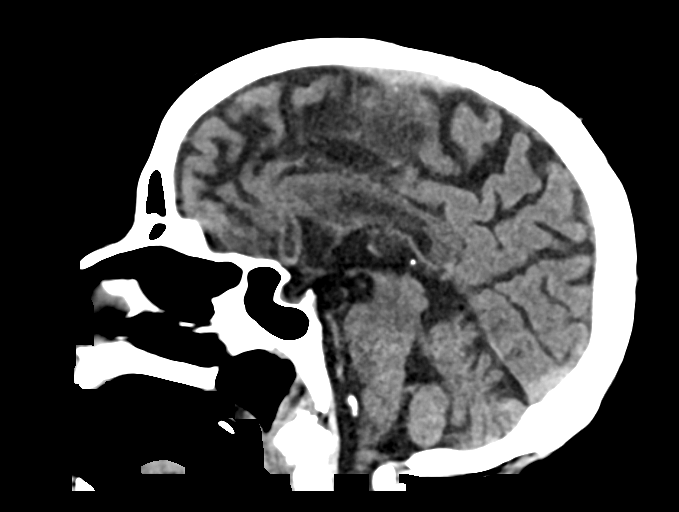
[im 33/50  brain]
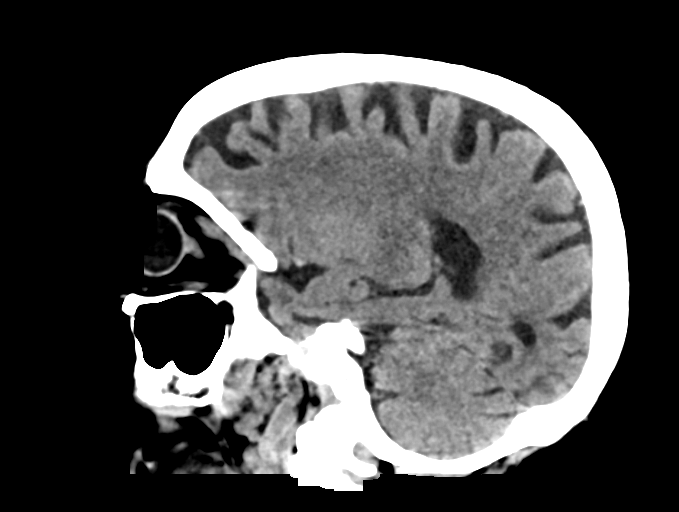

[17 of 47 positions shown; findings below may reference images not displayed]

FINDINGS: Brain: Chronic generalized atrophy. No sign of old or acute focal
infarction, mass lesion, hemorrhage, hydrocephalus or extra-axial
collection.

Vascular: There is atherosclerotic calcification of the major
vessels at the base of the brain.

Skull: Negative

Sinuses/Orbits: Clear/normal

Other: None
IMPRESSION: No acute or traumatic finding. Chronic generalized atrophy.

## 2019-12-07 IMAGING — DX DG CHEST 1V PORT
1 series · 1 of 1 positions shown · non-contrast
Comparison: Chest x-rays dated 02/25/2018 and 02/24/2018

CLINICAL DATA: Respiratory failure.

EXAM:
PORTABLE CHEST 1 VIEW

[chest]
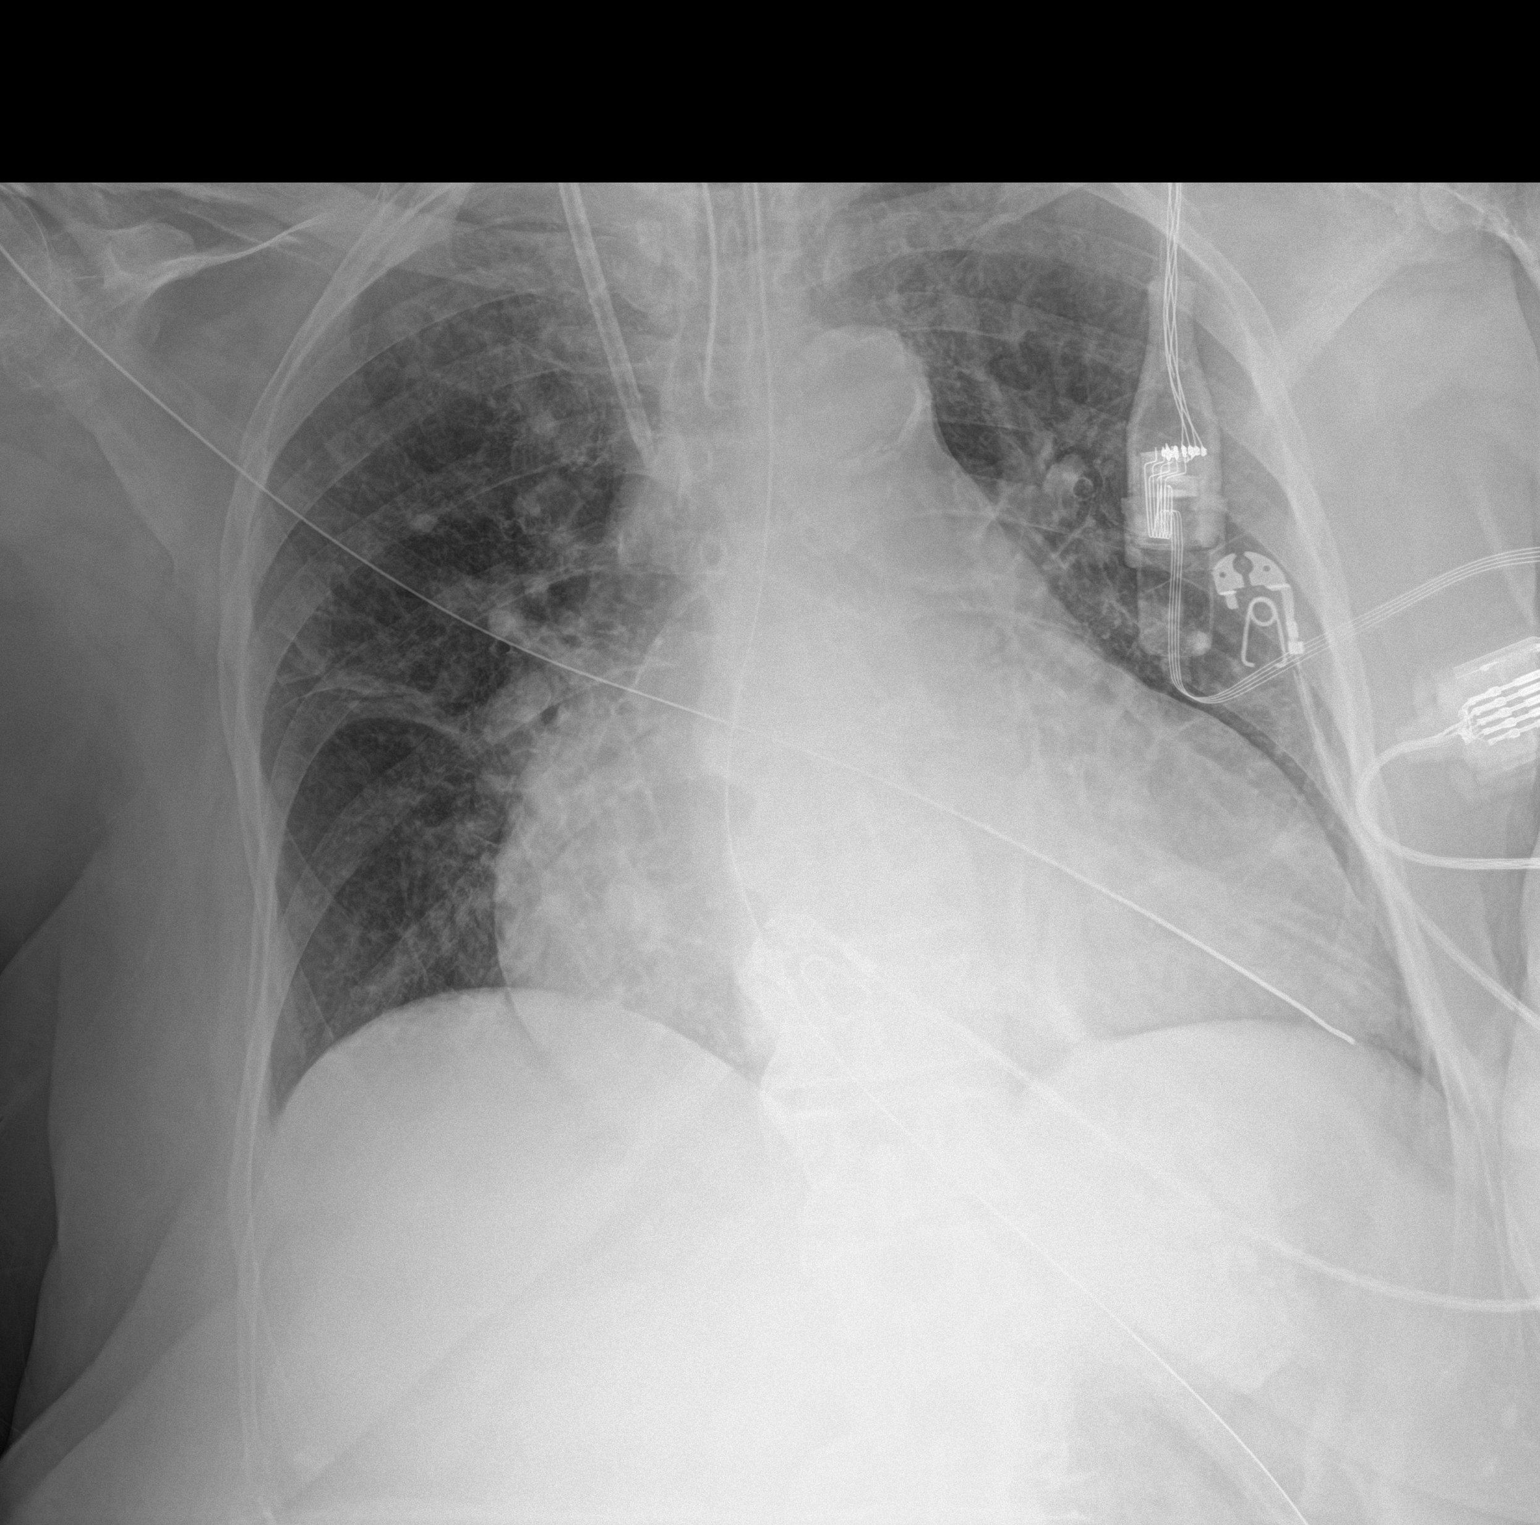

[1 of 1 positions shown; findings below may reference images not displayed]

FINDINGS: Endotracheal tube is well positioned with tip just above the level
of the carina. RIGHT IJ catheter is stable in position. Enteric tube
passes below the diaphragm.

Stable cardiomegaly. Mild central pulmonary vascular congestion,
likely chronic. No evidence of overt alveolar pulmonary edema. No
confluent opacity to suggest a developing pneumonia. No pleural
effusion or pneumothorax seen.
IMPRESSION: 1. Endotracheal tube well positioned with tip just above the level
of the carina.
2. Mild CHF, likely chronic. No evidence of pneumonia or overt
alveolar pulmonary edema.

## 2019-12-10 IMAGING — DX DG CHEST 1V PORT
1 series · 1 of 1 positions shown · non-contrast
Comparison: 03/03/2018

CLINICAL DATA: Tachypnea

EXAM:
PORTABLE CHEST 1 VIEW

[chest]
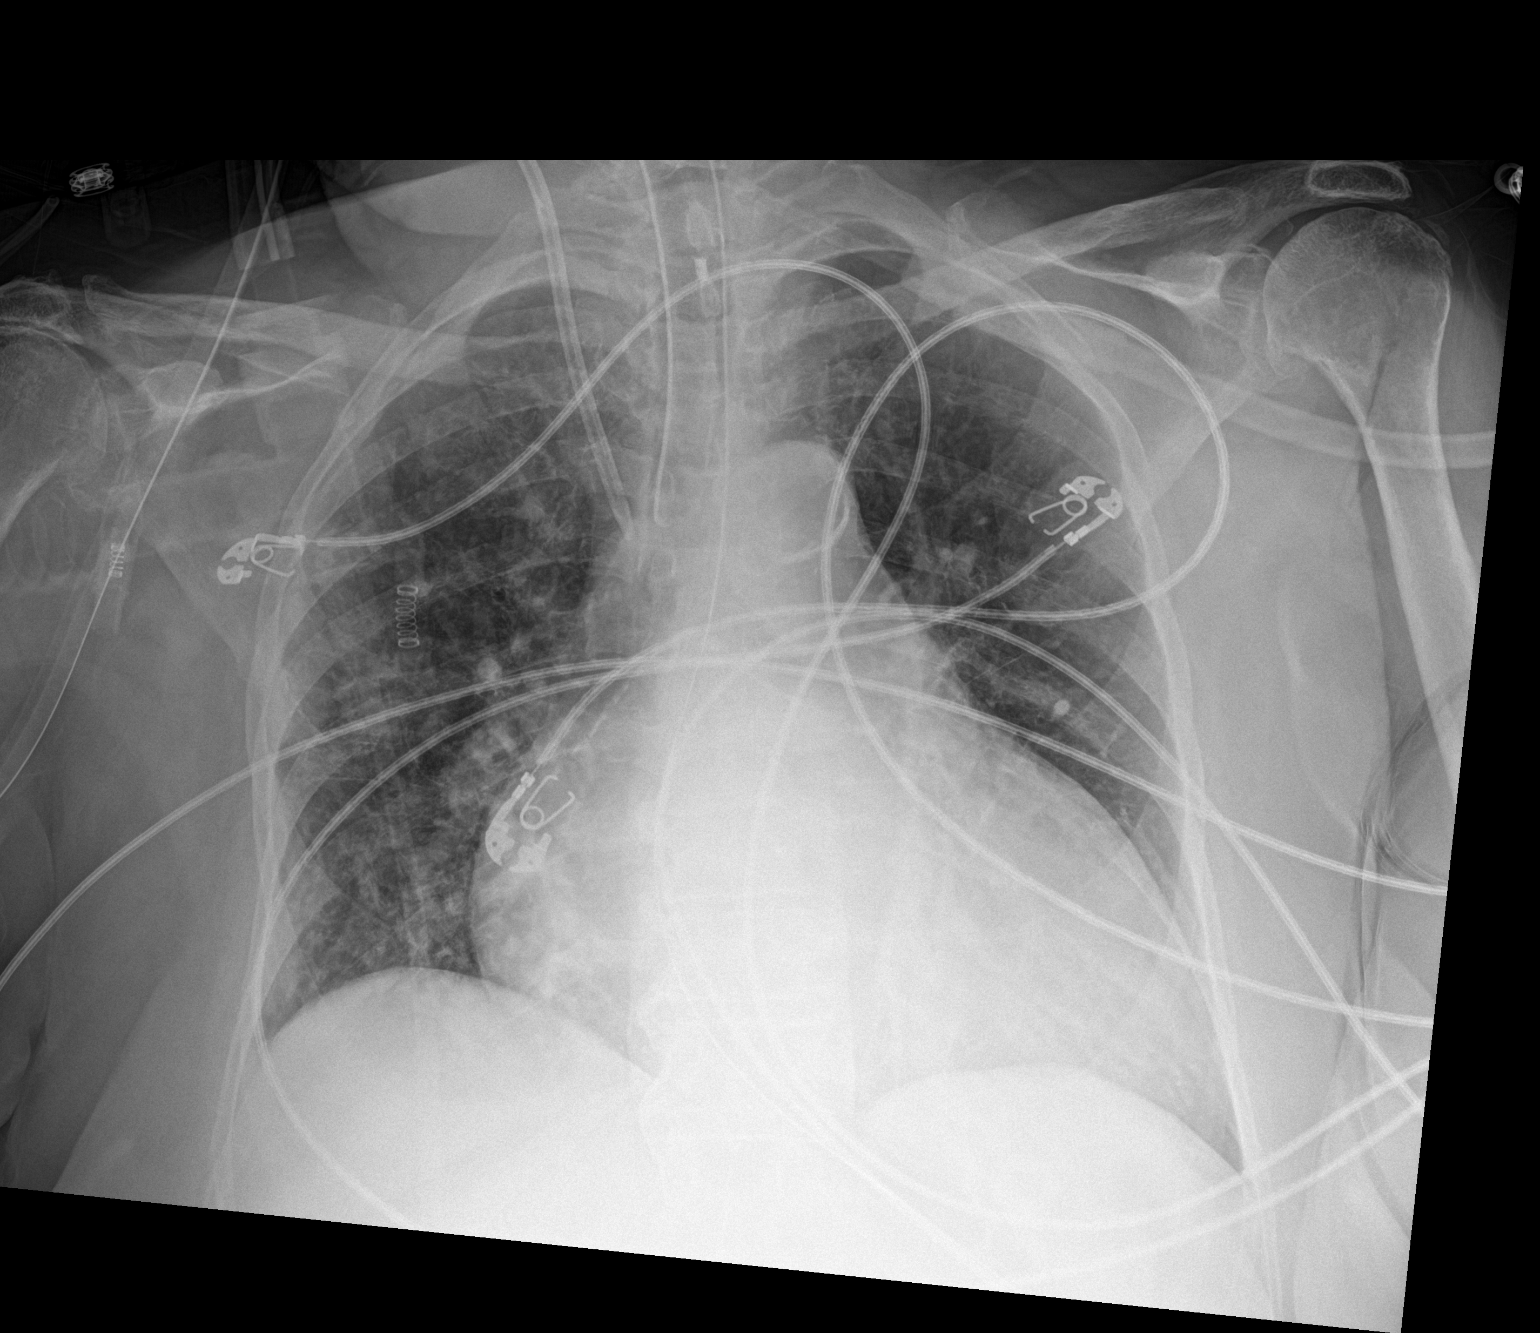

[1 of 1 positions shown; findings below may reference images not displayed]

FINDINGS: Cardiac shadow remains enlarged. Aortic calcifications are again
noted. Endotracheal tube, gastric catheter and right jugular central
line are again seen and stable. Mild vascular congestion is again
seen and stable. No significant interstitial edema is noted. Some
patchy atelectatic changes are seen in the right lung. No bony
abnormality noted.
IMPRESSION: Stable vascular congestion with mild atelectatic changes on the
right.
